# Patient Record
Sex: Female | Born: 1935
Health system: Southern US, Community
[De-identification: ages and names within clinical notes are randomized; demographics above are authoritative.]

## PROBLEM LIST (undated history)

## (undated) DIAGNOSIS — T7840XA Allergy, unspecified, initial encounter: Secondary | ICD-10-CM

## (undated) DIAGNOSIS — I8390 Asymptomatic varicose veins of unspecified lower extremity: Secondary | ICD-10-CM

## (undated) DIAGNOSIS — E7800 Pure hypercholesterolemia, unspecified: Secondary | ICD-10-CM

## (undated) DIAGNOSIS — R32 Unspecified urinary incontinence: Secondary | ICD-10-CM

## (undated) DIAGNOSIS — K5792 Diverticulitis of intestine, part unspecified, without perforation or abscess without bleeding: Secondary | ICD-10-CM

## (undated) DIAGNOSIS — I10 Essential (primary) hypertension: Secondary | ICD-10-CM

## (undated) DIAGNOSIS — M1990 Unspecified osteoarthritis, unspecified site: Secondary | ICD-10-CM

## (undated) HISTORY — DX: Diverticulitis of intestine, part unspecified, without perforation or abscess without bleeding: K57.92

## (undated) HISTORY — DX: Unspecified osteoarthritis, unspecified site: M19.90

## (undated) HISTORY — DX: Essential (primary) hypertension: I10

## (undated) HISTORY — DX: Pure hypercholesterolemia, unspecified: E78.00

## (undated) HISTORY — DX: Asymptomatic varicose veins of unspecified lower extremity: I83.90

## (undated) HISTORY — DX: Allergy, unspecified, initial encounter: T78.40XA

## (undated) HISTORY — PX: JOINT REPLACEMENT: SHX530

## (undated) HISTORY — DX: Unspecified urinary incontinence: R32

---

## 1969-10-31 HISTORY — PX: DILATION AND CURETTAGE OF UTERUS: SHX78

## 1970-10-31 HISTORY — PX: TUBAL LIGATION: SHX77

## 1998-10-31 HISTORY — PX: OTHER SURGICAL HISTORY: SHX169

## 2004-09-20 ENCOUNTER — Ambulatory Visit: Payer: Self-pay | Admitting: Internal Medicine

## 2005-10-10 ENCOUNTER — Ambulatory Visit: Payer: Self-pay | Admitting: Internal Medicine

## 2005-11-01 ENCOUNTER — Ambulatory Visit: Payer: Self-pay | Admitting: Internal Medicine

## 2006-10-16 ENCOUNTER — Ambulatory Visit: Payer: Self-pay | Admitting: Internal Medicine

## 2007-06-29 ENCOUNTER — Ambulatory Visit: Payer: Self-pay | Admitting: Gastroenterology

## 2007-10-22 ENCOUNTER — Ambulatory Visit: Payer: Self-pay | Admitting: Internal Medicine

## 2008-03-16 ENCOUNTER — Ambulatory Visit: Payer: Self-pay | Admitting: Family Medicine

## 2008-09-10 ENCOUNTER — Ambulatory Visit: Payer: Self-pay | Admitting: Internal Medicine

## 2008-10-27 ENCOUNTER — Ambulatory Visit: Payer: Self-pay | Admitting: Internal Medicine

## 2009-10-29 ENCOUNTER — Ambulatory Visit: Payer: Self-pay | Admitting: Internal Medicine

## 2010-11-02 ENCOUNTER — Ambulatory Visit: Payer: Self-pay | Admitting: Internal Medicine

## 2011-11-24 ENCOUNTER — Ambulatory Visit: Payer: Self-pay | Admitting: Obstetrics and Gynecology

## 2011-11-24 DIAGNOSIS — N63 Unspecified lump in unspecified breast: Secondary | ICD-10-CM | POA: Diagnosis not present

## 2011-11-24 DIAGNOSIS — Z1231 Encounter for screening mammogram for malignant neoplasm of breast: Secondary | ICD-10-CM | POA: Diagnosis not present

## 2011-12-16 DIAGNOSIS — N898 Other specified noninflammatory disorders of vagina: Secondary | ICD-10-CM | POA: Diagnosis not present

## 2011-12-16 DIAGNOSIS — N812 Incomplete uterovaginal prolapse: Secondary | ICD-10-CM | POA: Diagnosis not present

## 2012-01-12 DIAGNOSIS — N812 Incomplete uterovaginal prolapse: Secondary | ICD-10-CM | POA: Diagnosis not present

## 2012-01-12 DIAGNOSIS — N898 Other specified noninflammatory disorders of vagina: Secondary | ICD-10-CM | POA: Diagnosis not present

## 2012-02-06 DIAGNOSIS — N393 Stress incontinence (female) (male): Secondary | ICD-10-CM | POA: Diagnosis not present

## 2012-03-01 DIAGNOSIS — N812 Incomplete uterovaginal prolapse: Secondary | ICD-10-CM | POA: Diagnosis not present

## 2012-06-05 DIAGNOSIS — N812 Incomplete uterovaginal prolapse: Secondary | ICD-10-CM | POA: Diagnosis not present

## 2012-06-26 DIAGNOSIS — D1800 Hemangioma unspecified site: Secondary | ICD-10-CM | POA: Diagnosis not present

## 2012-06-26 DIAGNOSIS — I8390 Asymptomatic varicose veins of unspecified lower extremity: Secondary | ICD-10-CM | POA: Diagnosis not present

## 2012-06-26 DIAGNOSIS — L819 Disorder of pigmentation, unspecified: Secondary | ICD-10-CM | POA: Diagnosis not present

## 2012-06-26 DIAGNOSIS — L821 Other seborrheic keratosis: Secondary | ICD-10-CM | POA: Diagnosis not present

## 2012-06-26 DIAGNOSIS — D239 Other benign neoplasm of skin, unspecified: Secondary | ICD-10-CM | POA: Diagnosis not present

## 2012-07-24 DIAGNOSIS — M549 Dorsalgia, unspecified: Secondary | ICD-10-CM | POA: Diagnosis not present

## 2012-07-30 DIAGNOSIS — R51 Headache: Secondary | ICD-10-CM | POA: Diagnosis not present

## 2012-07-30 DIAGNOSIS — IMO0002 Reserved for concepts with insufficient information to code with codable children: Secondary | ICD-10-CM | POA: Diagnosis not present

## 2012-08-06 ENCOUNTER — Ambulatory Visit: Payer: Self-pay | Admitting: Unknown Physician Specialty

## 2012-08-06 DIAGNOSIS — R51 Headache: Secondary | ICD-10-CM | POA: Diagnosis not present

## 2012-08-06 DIAGNOSIS — J3489 Other specified disorders of nose and nasal sinuses: Secondary | ICD-10-CM | POA: Diagnosis not present

## 2012-08-08 DIAGNOSIS — R9409 Abnormal results of other function studies of central nervous system: Secondary | ICD-10-CM | POA: Diagnosis not present

## 2012-08-08 DIAGNOSIS — G500 Trigeminal neuralgia: Secondary | ICD-10-CM | POA: Diagnosis not present

## 2012-08-08 DIAGNOSIS — M549 Dorsalgia, unspecified: Secondary | ICD-10-CM | POA: Diagnosis not present

## 2012-09-07 DIAGNOSIS — N812 Incomplete uterovaginal prolapse: Secondary | ICD-10-CM | POA: Diagnosis not present

## 2012-09-12 DIAGNOSIS — G500 Trigeminal neuralgia: Secondary | ICD-10-CM | POA: Diagnosis not present

## 2012-09-17 DIAGNOSIS — N812 Incomplete uterovaginal prolapse: Secondary | ICD-10-CM | POA: Diagnosis not present

## 2012-09-19 DIAGNOSIS — M549 Dorsalgia, unspecified: Secondary | ICD-10-CM | POA: Diagnosis not present

## 2012-09-19 DIAGNOSIS — R748 Abnormal levels of other serum enzymes: Secondary | ICD-10-CM | POA: Diagnosis not present

## 2012-09-19 DIAGNOSIS — T887XXA Unspecified adverse effect of drug or medicament, initial encounter: Secondary | ICD-10-CM | POA: Diagnosis not present

## 2012-09-19 DIAGNOSIS — G500 Trigeminal neuralgia: Secondary | ICD-10-CM | POA: Diagnosis not present

## 2012-09-19 DIAGNOSIS — M4120 Other idiopathic scoliosis, site unspecified: Secondary | ICD-10-CM | POA: Diagnosis not present

## 2012-09-24 DIAGNOSIS — J329 Chronic sinusitis, unspecified: Secondary | ICD-10-CM | POA: Diagnosis not present

## 2012-09-24 DIAGNOSIS — R748 Abnormal levels of other serum enzymes: Secondary | ICD-10-CM | POA: Diagnosis not present

## 2012-09-24 DIAGNOSIS — E860 Dehydration: Secondary | ICD-10-CM | POA: Diagnosis not present

## 2012-10-03 ENCOUNTER — Ambulatory Visit: Payer: Self-pay | Admitting: Family Medicine

## 2012-10-03 DIAGNOSIS — R748 Abnormal levels of other serum enzymes: Secondary | ICD-10-CM | POA: Diagnosis not present

## 2012-10-03 DIAGNOSIS — R935 Abnormal findings on diagnostic imaging of other abdominal regions, including retroperitoneum: Secondary | ICD-10-CM | POA: Diagnosis not present

## 2012-10-03 DIAGNOSIS — R7989 Other specified abnormal findings of blood chemistry: Secondary | ICD-10-CM | POA: Diagnosis not present

## 2012-10-26 DIAGNOSIS — M545 Low back pain: Secondary | ICD-10-CM | POA: Diagnosis not present

## 2012-11-29 ENCOUNTER — Ambulatory Visit: Payer: Self-pay | Admitting: Unknown Physician Specialty

## 2012-11-29 DIAGNOSIS — M47817 Spondylosis without myelopathy or radiculopathy, lumbosacral region: Secondary | ICD-10-CM | POA: Diagnosis not present

## 2012-11-29 DIAGNOSIS — M5126 Other intervertebral disc displacement, lumbar region: Secondary | ICD-10-CM | POA: Diagnosis not present

## 2012-12-04 ENCOUNTER — Ambulatory Visit: Payer: Self-pay | Admitting: Obstetrics and Gynecology

## 2012-12-04 DIAGNOSIS — M47817 Spondylosis without myelopathy or radiculopathy, lumbosacral region: Secondary | ICD-10-CM | POA: Diagnosis not present

## 2012-12-04 DIAGNOSIS — IMO0002 Reserved for concepts with insufficient information to code with codable children: Secondary | ICD-10-CM | POA: Diagnosis not present

## 2012-12-04 DIAGNOSIS — Z1231 Encounter for screening mammogram for malignant neoplasm of breast: Secondary | ICD-10-CM | POA: Diagnosis not present

## 2012-12-04 DIAGNOSIS — M48062 Spinal stenosis, lumbar region with neurogenic claudication: Secondary | ICD-10-CM | POA: Diagnosis not present

## 2012-12-10 DIAGNOSIS — M545 Low back pain: Secondary | ICD-10-CM | POA: Diagnosis not present

## 2012-12-10 DIAGNOSIS — N812 Incomplete uterovaginal prolapse: Secondary | ICD-10-CM | POA: Diagnosis not present

## 2012-12-18 DIAGNOSIS — M545 Low back pain: Secondary | ICD-10-CM | POA: Diagnosis not present

## 2012-12-31 DIAGNOSIS — IMO0002 Reserved for concepts with insufficient information to code with codable children: Secondary | ICD-10-CM | POA: Diagnosis not present

## 2012-12-31 DIAGNOSIS — M47817 Spondylosis without myelopathy or radiculopathy, lumbosacral region: Secondary | ICD-10-CM | POA: Diagnosis not present

## 2012-12-31 DIAGNOSIS — M48062 Spinal stenosis, lumbar region with neurogenic claudication: Secondary | ICD-10-CM | POA: Diagnosis not present

## 2013-01-16 DIAGNOSIS — M62838 Other muscle spasm: Secondary | ICD-10-CM | POA: Diagnosis not present

## 2013-01-16 DIAGNOSIS — M47817 Spondylosis without myelopathy or radiculopathy, lumbosacral region: Secondary | ICD-10-CM | POA: Diagnosis not present

## 2013-01-18 DIAGNOSIS — R51 Headache: Secondary | ICD-10-CM | POA: Diagnosis not present

## 2013-01-18 DIAGNOSIS — R6889 Other general symptoms and signs: Secondary | ICD-10-CM | POA: Diagnosis not present

## 2013-01-18 DIAGNOSIS — G500 Trigeminal neuralgia: Secondary | ICD-10-CM | POA: Diagnosis not present

## 2013-01-29 DIAGNOSIS — G500 Trigeminal neuralgia: Secondary | ICD-10-CM | POA: Diagnosis not present

## 2013-01-29 DIAGNOSIS — M4800 Spinal stenosis, site unspecified: Secondary | ICD-10-CM | POA: Diagnosis not present

## 2013-02-08 DIAGNOSIS — M5380 Other specified dorsopathies, site unspecified: Secondary | ICD-10-CM | POA: Diagnosis not present

## 2013-02-21 DIAGNOSIS — I10 Essential (primary) hypertension: Secondary | ICD-10-CM | POA: Diagnosis not present

## 2013-02-28 DIAGNOSIS — M5380 Other specified dorsopathies, site unspecified: Secondary | ICD-10-CM | POA: Diagnosis not present

## 2013-03-11 DIAGNOSIS — N812 Incomplete uterovaginal prolapse: Secondary | ICD-10-CM | POA: Diagnosis not present

## 2013-03-20 DIAGNOSIS — M5380 Other specified dorsopathies, site unspecified: Secondary | ICD-10-CM | POA: Diagnosis not present

## 2013-04-08 DIAGNOSIS — M48061 Spinal stenosis, lumbar region without neurogenic claudication: Secondary | ICD-10-CM | POA: Diagnosis not present

## 2013-04-15 DIAGNOSIS — G500 Trigeminal neuralgia: Secondary | ICD-10-CM | POA: Diagnosis not present

## 2013-04-18 DIAGNOSIS — M48061 Spinal stenosis, lumbar region without neurogenic claudication: Secondary | ICD-10-CM | POA: Diagnosis not present

## 2013-05-06 DIAGNOSIS — M810 Age-related osteoporosis without current pathological fracture: Secondary | ICD-10-CM | POA: Diagnosis not present

## 2013-05-06 DIAGNOSIS — M48061 Spinal stenosis, lumbar region without neurogenic claudication: Secondary | ICD-10-CM | POA: Diagnosis not present

## 2013-05-14 DIAGNOSIS — M47817 Spondylosis without myelopathy or radiculopathy, lumbosacral region: Secondary | ICD-10-CM | POA: Diagnosis not present

## 2013-05-14 DIAGNOSIS — M5137 Other intervertebral disc degeneration, lumbosacral region: Secondary | ICD-10-CM | POA: Diagnosis not present

## 2013-05-14 DIAGNOSIS — M810 Age-related osteoporosis without current pathological fracture: Secondary | ICD-10-CM | POA: Diagnosis not present

## 2013-05-16 DIAGNOSIS — R5381 Other malaise: Secondary | ICD-10-CM | POA: Diagnosis not present

## 2013-05-16 DIAGNOSIS — I10 Essential (primary) hypertension: Secondary | ICD-10-CM | POA: Diagnosis not present

## 2013-06-05 DIAGNOSIS — M5137 Other intervertebral disc degeneration, lumbosacral region: Secondary | ICD-10-CM | POA: Diagnosis not present

## 2013-06-05 DIAGNOSIS — M5136 Other intervertebral disc degeneration, lumbar region: Secondary | ICD-10-CM | POA: Insufficient documentation

## 2013-06-05 DIAGNOSIS — M5380 Other specified dorsopathies, site unspecified: Secondary | ICD-10-CM | POA: Diagnosis not present

## 2013-06-14 DIAGNOSIS — N812 Incomplete uterovaginal prolapse: Secondary | ICD-10-CM | POA: Diagnosis not present

## 2013-06-18 DIAGNOSIS — M5380 Other specified dorsopathies, site unspecified: Secondary | ICD-10-CM | POA: Diagnosis not present

## 2013-07-09 DIAGNOSIS — M47817 Spondylosis without myelopathy or radiculopathy, lumbosacral region: Secondary | ICD-10-CM | POA: Diagnosis not present

## 2013-07-09 DIAGNOSIS — M4310 Spondylolisthesis, site unspecified: Secondary | ICD-10-CM | POA: Diagnosis not present

## 2013-07-09 DIAGNOSIS — M4800 Spinal stenosis, site unspecified: Secondary | ICD-10-CM | POA: Diagnosis not present

## 2013-07-09 DIAGNOSIS — M47812 Spondylosis without myelopathy or radiculopathy, cervical region: Secondary | ICD-10-CM | POA: Diagnosis not present

## 2013-07-09 DIAGNOSIS — Q762 Congenital spondylolisthesis: Secondary | ICD-10-CM | POA: Diagnosis not present

## 2013-07-16 ENCOUNTER — Ambulatory Visit: Payer: Self-pay | Admitting: Family Medicine

## 2013-07-16 DIAGNOSIS — M899 Disorder of bone, unspecified: Secondary | ICD-10-CM | POA: Diagnosis not present

## 2013-07-16 DIAGNOSIS — Z780 Asymptomatic menopausal state: Secondary | ICD-10-CM | POA: Diagnosis not present

## 2013-07-23 DIAGNOSIS — G500 Trigeminal neuralgia: Secondary | ICD-10-CM | POA: Diagnosis not present

## 2013-08-20 DIAGNOSIS — M48061 Spinal stenosis, lumbar region without neurogenic claudication: Secondary | ICD-10-CM | POA: Diagnosis not present

## 2013-08-20 DIAGNOSIS — M5380 Other specified dorsopathies, site unspecified: Secondary | ICD-10-CM | POA: Diagnosis not present

## 2013-08-20 DIAGNOSIS — M4800 Spinal stenosis, site unspecified: Secondary | ICD-10-CM | POA: Diagnosis not present

## 2013-08-20 DIAGNOSIS — M4310 Spondylolisthesis, site unspecified: Secondary | ICD-10-CM | POA: Diagnosis not present

## 2013-08-27 DIAGNOSIS — D239 Other benign neoplasm of skin, unspecified: Secondary | ICD-10-CM | POA: Diagnosis not present

## 2013-08-27 DIAGNOSIS — L819 Disorder of pigmentation, unspecified: Secondary | ICD-10-CM | POA: Diagnosis not present

## 2013-08-27 DIAGNOSIS — D1800 Hemangioma unspecified site: Secondary | ICD-10-CM | POA: Diagnosis not present

## 2013-08-27 DIAGNOSIS — L821 Other seborrheic keratosis: Secondary | ICD-10-CM | POA: Diagnosis not present

## 2013-08-27 DIAGNOSIS — Z23 Encounter for immunization: Secondary | ICD-10-CM | POA: Diagnosis not present

## 2013-08-28 DIAGNOSIS — N812 Incomplete uterovaginal prolapse: Secondary | ICD-10-CM | POA: Diagnosis not present

## 2013-09-13 DIAGNOSIS — M4800 Spinal stenosis, site unspecified: Secondary | ICD-10-CM | POA: Insufficient documentation

## 2013-09-13 DIAGNOSIS — M4310 Spondylolisthesis, site unspecified: Secondary | ICD-10-CM | POA: Insufficient documentation

## 2013-09-16 DIAGNOSIS — Z981 Arthrodesis status: Secondary | ICD-10-CM | POA: Diagnosis not present

## 2013-09-16 DIAGNOSIS — Z0389 Encounter for observation for other suspected diseases and conditions ruled out: Secondary | ICD-10-CM | POA: Diagnosis not present

## 2013-09-16 DIAGNOSIS — D509 Iron deficiency anemia, unspecified: Secondary | ICD-10-CM | POA: Diagnosis not present

## 2013-09-16 DIAGNOSIS — R471 Dysarthria and anarthria: Secondary | ICD-10-CM | POA: Diagnosis not present

## 2013-09-16 DIAGNOSIS — I6529 Occlusion and stenosis of unspecified carotid artery: Secondary | ICD-10-CM | POA: Diagnosis not present

## 2013-09-16 DIAGNOSIS — M6281 Muscle weakness (generalized): Secondary | ICD-10-CM | POA: Diagnosis not present

## 2013-09-16 DIAGNOSIS — R482 Apraxia: Secondary | ICD-10-CM | POA: Diagnosis not present

## 2013-09-16 DIAGNOSIS — I10 Essential (primary) hypertension: Secondary | ICD-10-CM | POA: Diagnosis not present

## 2013-09-16 DIAGNOSIS — Z4789 Encounter for other orthopedic aftercare: Secondary | ICD-10-CM | POA: Diagnosis not present

## 2013-09-16 DIAGNOSIS — M899 Disorder of bone, unspecified: Secondary | ICD-10-CM | POA: Diagnosis present

## 2013-09-16 DIAGNOSIS — I6350 Cerebral infarction due to unspecified occlusion or stenosis of unspecified cerebral artery: Secondary | ICD-10-CM | POA: Diagnosis not present

## 2013-09-16 DIAGNOSIS — M48061 Spinal stenosis, lumbar region without neurogenic claudication: Secondary | ICD-10-CM | POA: Diagnosis not present

## 2013-09-16 DIAGNOSIS — M5137 Other intervertebral disc degeneration, lumbosacral region: Secondary | ICD-10-CM | POA: Diagnosis present

## 2013-09-16 DIAGNOSIS — R29810 Facial weakness: Secondary | ICD-10-CM | POA: Diagnosis not present

## 2013-09-16 DIAGNOSIS — Z87891 Personal history of nicotine dependence: Secondary | ICD-10-CM | POA: Diagnosis not present

## 2013-09-16 DIAGNOSIS — M4310 Spondylolisthesis, site unspecified: Secondary | ICD-10-CM | POA: Diagnosis not present

## 2013-09-16 DIAGNOSIS — M4800 Spinal stenosis, site unspecified: Secondary | ICD-10-CM | POA: Diagnosis not present

## 2013-09-16 DIAGNOSIS — I6789 Other cerebrovascular disease: Secondary | ICD-10-CM | POA: Diagnosis not present

## 2013-09-16 DIAGNOSIS — R0902 Hypoxemia: Secondary | ICD-10-CM | POA: Diagnosis not present

## 2013-09-16 DIAGNOSIS — G500 Trigeminal neuralgia: Secondary | ICD-10-CM | POA: Diagnosis present

## 2013-09-16 DIAGNOSIS — Q762 Congenital spondylolisthesis: Secondary | ICD-10-CM | POA: Diagnosis not present

## 2013-09-16 HISTORY — PX: BACK SURGERY: SHX140

## 2013-09-20 DIAGNOSIS — D509 Iron deficiency anemia, unspecified: Secondary | ICD-10-CM | POA: Diagnosis not present

## 2013-09-20 DIAGNOSIS — Z4789 Encounter for other orthopedic aftercare: Secondary | ICD-10-CM | POA: Diagnosis not present

## 2013-09-20 DIAGNOSIS — I6350 Cerebral infarction due to unspecified occlusion or stenosis of unspecified cerebral artery: Secondary | ICD-10-CM | POA: Diagnosis not present

## 2013-09-20 DIAGNOSIS — R482 Apraxia: Secondary | ICD-10-CM | POA: Diagnosis not present

## 2013-09-20 DIAGNOSIS — M6281 Muscle weakness (generalized): Secondary | ICD-10-CM | POA: Diagnosis not present

## 2013-09-20 DIAGNOSIS — M4800 Spinal stenosis, site unspecified: Secondary | ICD-10-CM | POA: Diagnosis not present

## 2013-09-20 DIAGNOSIS — Q762 Congenital spondylolisthesis: Secondary | ICD-10-CM | POA: Diagnosis not present

## 2013-09-20 DIAGNOSIS — I6789 Other cerebrovascular disease: Secondary | ICD-10-CM

## 2013-09-20 DIAGNOSIS — I10 Essential (primary) hypertension: Secondary | ICD-10-CM

## 2013-09-20 DIAGNOSIS — M48061 Spinal stenosis, lumbar region without neurogenic claudication: Secondary | ICD-10-CM | POA: Diagnosis not present

## 2013-10-06 DIAGNOSIS — Z8673 Personal history of transient ischemic attack (TIA), and cerebral infarction without residual deficits: Secondary | ICD-10-CM | POA: Diagnosis not present

## 2013-10-06 DIAGNOSIS — Z4789 Encounter for other orthopedic aftercare: Secondary | ICD-10-CM | POA: Diagnosis not present

## 2013-10-06 DIAGNOSIS — M159 Polyosteoarthritis, unspecified: Secondary | ICD-10-CM | POA: Diagnosis not present

## 2013-10-06 DIAGNOSIS — G609 Hereditary and idiopathic neuropathy, unspecified: Secondary | ICD-10-CM | POA: Diagnosis not present

## 2013-10-06 DIAGNOSIS — I739 Peripheral vascular disease, unspecified: Secondary | ICD-10-CM | POA: Diagnosis not present

## 2013-10-06 DIAGNOSIS — Z5189 Encounter for other specified aftercare: Secondary | ICD-10-CM | POA: Diagnosis not present

## 2013-10-06 DIAGNOSIS — I10 Essential (primary) hypertension: Secondary | ICD-10-CM | POA: Diagnosis not present

## 2013-10-08 DIAGNOSIS — M159 Polyosteoarthritis, unspecified: Secondary | ICD-10-CM | POA: Diagnosis not present

## 2013-10-08 DIAGNOSIS — Z5189 Encounter for other specified aftercare: Secondary | ICD-10-CM | POA: Diagnosis not present

## 2013-10-08 DIAGNOSIS — I739 Peripheral vascular disease, unspecified: Secondary | ICD-10-CM | POA: Diagnosis not present

## 2013-10-08 DIAGNOSIS — G609 Hereditary and idiopathic neuropathy, unspecified: Secondary | ICD-10-CM | POA: Diagnosis not present

## 2013-10-08 DIAGNOSIS — I10 Essential (primary) hypertension: Secondary | ICD-10-CM | POA: Diagnosis not present

## 2013-10-08 DIAGNOSIS — Z4789 Encounter for other orthopedic aftercare: Secondary | ICD-10-CM | POA: Diagnosis not present

## 2013-10-10 DIAGNOSIS — Z4789 Encounter for other orthopedic aftercare: Secondary | ICD-10-CM | POA: Diagnosis not present

## 2013-10-10 DIAGNOSIS — M159 Polyosteoarthritis, unspecified: Secondary | ICD-10-CM | POA: Diagnosis not present

## 2013-10-10 DIAGNOSIS — G609 Hereditary and idiopathic neuropathy, unspecified: Secondary | ICD-10-CM | POA: Diagnosis not present

## 2013-10-10 DIAGNOSIS — I10 Essential (primary) hypertension: Secondary | ICD-10-CM | POA: Diagnosis not present

## 2013-10-10 DIAGNOSIS — Z5189 Encounter for other specified aftercare: Secondary | ICD-10-CM | POA: Diagnosis not present

## 2013-10-10 DIAGNOSIS — I739 Peripheral vascular disease, unspecified: Secondary | ICD-10-CM | POA: Diagnosis not present

## 2013-10-11 DIAGNOSIS — Z4789 Encounter for other orthopedic aftercare: Secondary | ICD-10-CM | POA: Diagnosis not present

## 2013-10-11 DIAGNOSIS — I10 Essential (primary) hypertension: Secondary | ICD-10-CM | POA: Diagnosis not present

## 2013-10-11 DIAGNOSIS — M159 Polyosteoarthritis, unspecified: Secondary | ICD-10-CM | POA: Diagnosis not present

## 2013-10-11 DIAGNOSIS — G609 Hereditary and idiopathic neuropathy, unspecified: Secondary | ICD-10-CM | POA: Diagnosis not present

## 2013-10-11 DIAGNOSIS — Z5189 Encounter for other specified aftercare: Secondary | ICD-10-CM | POA: Diagnosis not present

## 2013-10-11 DIAGNOSIS — I739 Peripheral vascular disease, unspecified: Secondary | ICD-10-CM | POA: Diagnosis not present

## 2013-10-14 DIAGNOSIS — M159 Polyosteoarthritis, unspecified: Secondary | ICD-10-CM | POA: Diagnosis not present

## 2013-10-14 DIAGNOSIS — I10 Essential (primary) hypertension: Secondary | ICD-10-CM | POA: Diagnosis not present

## 2013-10-14 DIAGNOSIS — Z5189 Encounter for other specified aftercare: Secondary | ICD-10-CM | POA: Diagnosis not present

## 2013-10-14 DIAGNOSIS — Z4789 Encounter for other orthopedic aftercare: Secondary | ICD-10-CM | POA: Diagnosis not present

## 2013-10-14 DIAGNOSIS — G609 Hereditary and idiopathic neuropathy, unspecified: Secondary | ICD-10-CM | POA: Diagnosis not present

## 2013-10-14 DIAGNOSIS — I739 Peripheral vascular disease, unspecified: Secondary | ICD-10-CM | POA: Diagnosis not present

## 2013-10-15 DIAGNOSIS — G609 Hereditary and idiopathic neuropathy, unspecified: Secondary | ICD-10-CM | POA: Diagnosis not present

## 2013-10-15 DIAGNOSIS — M159 Polyosteoarthritis, unspecified: Secondary | ICD-10-CM | POA: Diagnosis not present

## 2013-10-15 DIAGNOSIS — I10 Essential (primary) hypertension: Secondary | ICD-10-CM | POA: Diagnosis not present

## 2013-10-15 DIAGNOSIS — I739 Peripheral vascular disease, unspecified: Secondary | ICD-10-CM | POA: Diagnosis not present

## 2013-10-15 DIAGNOSIS — Z5189 Encounter for other specified aftercare: Secondary | ICD-10-CM | POA: Diagnosis not present

## 2013-10-15 DIAGNOSIS — Z4789 Encounter for other orthopedic aftercare: Secondary | ICD-10-CM | POA: Diagnosis not present

## 2013-10-16 ENCOUNTER — Ambulatory Visit: Payer: Self-pay | Admitting: Adult Health

## 2013-10-18 DIAGNOSIS — Z4789 Encounter for other orthopedic aftercare: Secondary | ICD-10-CM | POA: Diagnosis not present

## 2013-10-18 DIAGNOSIS — M159 Polyosteoarthritis, unspecified: Secondary | ICD-10-CM | POA: Diagnosis not present

## 2013-10-18 DIAGNOSIS — I10 Essential (primary) hypertension: Secondary | ICD-10-CM | POA: Diagnosis not present

## 2013-10-18 DIAGNOSIS — G609 Hereditary and idiopathic neuropathy, unspecified: Secondary | ICD-10-CM | POA: Diagnosis not present

## 2013-10-18 DIAGNOSIS — I739 Peripheral vascular disease, unspecified: Secondary | ICD-10-CM | POA: Diagnosis not present

## 2013-10-18 DIAGNOSIS — Z5189 Encounter for other specified aftercare: Secondary | ICD-10-CM | POA: Diagnosis not present

## 2013-10-21 DIAGNOSIS — I10 Essential (primary) hypertension: Secondary | ICD-10-CM | POA: Diagnosis not present

## 2013-10-21 DIAGNOSIS — Z5189 Encounter for other specified aftercare: Secondary | ICD-10-CM | POA: Diagnosis not present

## 2013-10-21 DIAGNOSIS — G609 Hereditary and idiopathic neuropathy, unspecified: Secondary | ICD-10-CM | POA: Diagnosis not present

## 2013-10-21 DIAGNOSIS — Z4789 Encounter for other orthopedic aftercare: Secondary | ICD-10-CM | POA: Diagnosis not present

## 2013-10-21 DIAGNOSIS — M159 Polyosteoarthritis, unspecified: Secondary | ICD-10-CM | POA: Diagnosis not present

## 2013-10-21 DIAGNOSIS — I739 Peripheral vascular disease, unspecified: Secondary | ICD-10-CM | POA: Diagnosis not present

## 2013-10-22 DIAGNOSIS — G609 Hereditary and idiopathic neuropathy, unspecified: Secondary | ICD-10-CM | POA: Diagnosis not present

## 2013-10-22 DIAGNOSIS — I739 Peripheral vascular disease, unspecified: Secondary | ICD-10-CM | POA: Diagnosis not present

## 2013-10-22 DIAGNOSIS — Z4789 Encounter for other orthopedic aftercare: Secondary | ICD-10-CM | POA: Diagnosis not present

## 2013-10-22 DIAGNOSIS — I10 Essential (primary) hypertension: Secondary | ICD-10-CM | POA: Diagnosis not present

## 2013-10-22 DIAGNOSIS — Z5189 Encounter for other specified aftercare: Secondary | ICD-10-CM | POA: Diagnosis not present

## 2013-10-22 DIAGNOSIS — M159 Polyosteoarthritis, unspecified: Secondary | ICD-10-CM | POA: Diagnosis not present

## 2013-10-25 DIAGNOSIS — Z4789 Encounter for other orthopedic aftercare: Secondary | ICD-10-CM | POA: Diagnosis not present

## 2013-10-25 DIAGNOSIS — I10 Essential (primary) hypertension: Secondary | ICD-10-CM | POA: Diagnosis not present

## 2013-10-25 DIAGNOSIS — Z5189 Encounter for other specified aftercare: Secondary | ICD-10-CM | POA: Diagnosis not present

## 2013-10-25 DIAGNOSIS — I739 Peripheral vascular disease, unspecified: Secondary | ICD-10-CM | POA: Diagnosis not present

## 2013-10-25 DIAGNOSIS — G609 Hereditary and idiopathic neuropathy, unspecified: Secondary | ICD-10-CM | POA: Diagnosis not present

## 2013-10-25 DIAGNOSIS — M159 Polyosteoarthritis, unspecified: Secondary | ICD-10-CM | POA: Diagnosis not present

## 2013-10-29 DIAGNOSIS — Z09 Encounter for follow-up examination after completed treatment for conditions other than malignant neoplasm: Secondary | ICD-10-CM | POA: Diagnosis not present

## 2013-10-29 DIAGNOSIS — Z4789 Encounter for other orthopedic aftercare: Secondary | ICD-10-CM | POA: Diagnosis not present

## 2013-10-29 DIAGNOSIS — Z981 Arthrodesis status: Secondary | ICD-10-CM | POA: Diagnosis not present

## 2013-11-27 DIAGNOSIS — M545 Low back pain, unspecified: Secondary | ICD-10-CM | POA: Diagnosis not present

## 2013-11-27 DIAGNOSIS — M4800 Spinal stenosis, site unspecified: Secondary | ICD-10-CM | POA: Diagnosis not present

## 2013-11-27 DIAGNOSIS — R262 Difficulty in walking, not elsewhere classified: Secondary | ICD-10-CM | POA: Diagnosis not present

## 2013-12-01 DIAGNOSIS — M4800 Spinal stenosis, site unspecified: Secondary | ICD-10-CM | POA: Diagnosis not present

## 2013-12-01 DIAGNOSIS — M545 Low back pain, unspecified: Secondary | ICD-10-CM | POA: Diagnosis not present

## 2013-12-01 DIAGNOSIS — R262 Difficulty in walking, not elsewhere classified: Secondary | ICD-10-CM | POA: Diagnosis not present

## 2013-12-05 ENCOUNTER — Ambulatory Visit: Payer: Self-pay | Admitting: Obstetrics and Gynecology

## 2013-12-05 DIAGNOSIS — Z1231 Encounter for screening mammogram for malignant neoplasm of breast: Secondary | ICD-10-CM | POA: Diagnosis not present

## 2013-12-29 DIAGNOSIS — M4800 Spinal stenosis, site unspecified: Secondary | ICD-10-CM | POA: Diagnosis not present

## 2013-12-29 DIAGNOSIS — R262 Difficulty in walking, not elsewhere classified: Secondary | ICD-10-CM | POA: Diagnosis not present

## 2013-12-29 DIAGNOSIS — M545 Low back pain, unspecified: Secondary | ICD-10-CM | POA: Diagnosis not present

## 2014-01-06 ENCOUNTER — Encounter: Payer: Self-pay | Admitting: Internal Medicine

## 2014-01-06 ENCOUNTER — Ambulatory Visit (INDEPENDENT_AMBULATORY_CARE_PROVIDER_SITE_OTHER): Payer: Medicare Other | Admitting: Internal Medicine

## 2014-01-06 VITALS — BP 130/80 | HR 70 | Temp 97.7°F | Ht 65.0 in | Wt 152.0 lb

## 2014-01-06 DIAGNOSIS — Z23 Encounter for immunization: Secondary | ICD-10-CM | POA: Diagnosis not present

## 2014-01-06 DIAGNOSIS — R32 Unspecified urinary incontinence: Secondary | ICD-10-CM

## 2014-01-06 DIAGNOSIS — E7800 Pure hypercholesterolemia, unspecified: Secondary | ICD-10-CM

## 2014-01-06 DIAGNOSIS — I10 Essential (primary) hypertension: Secondary | ICD-10-CM

## 2014-01-06 DIAGNOSIS — M549 Dorsalgia, unspecified: Secondary | ICD-10-CM

## 2014-01-06 DIAGNOSIS — G500 Trigeminal neuralgia: Secondary | ICD-10-CM | POA: Diagnosis not present

## 2014-01-06 NOTE — Progress Notes (Signed)
Pre-visit discussion using our clinic review tool. No additional management support is needed unless otherwise documented below in the visit note.  

## 2014-01-07 ENCOUNTER — Encounter: Payer: Self-pay | Admitting: Internal Medicine

## 2014-01-07 DIAGNOSIS — E7800 Pure hypercholesterolemia, unspecified: Secondary | ICD-10-CM | POA: Insufficient documentation

## 2014-01-07 DIAGNOSIS — R32 Unspecified urinary incontinence: Secondary | ICD-10-CM | POA: Insufficient documentation

## 2014-01-07 DIAGNOSIS — M961 Postlaminectomy syndrome, not elsewhere classified: Secondary | ICD-10-CM | POA: Insufficient documentation

## 2014-01-07 DIAGNOSIS — I10 Essential (primary) hypertension: Secondary | ICD-10-CM | POA: Insufficient documentation

## 2014-01-07 DIAGNOSIS — G500 Trigeminal neuralgia: Secondary | ICD-10-CM | POA: Insufficient documentation

## 2014-01-07 NOTE — Assessment & Plan Note (Signed)
S/p back surgery.  Following with Dr Dwaine Gale.  Due f/u 01/21/14.

## 2014-01-07 NOTE — Assessment & Plan Note (Signed)
Blood pressure under good control.  Same medication regimen.  Obtain outside labs before checking labs.  Follow.

## 2014-01-07 NOTE — Assessment & Plan Note (Signed)
Pessary in place.  Follow.

## 2014-01-07 NOTE — Assessment & Plan Note (Signed)
Seeing neurology.  Doing well on her current regimen.  Follow.    

## 2014-01-07 NOTE — Progress Notes (Signed)
   Subjective:    Patient ID: Denise Barry, female    DOB: Oct 03, 1934, 80 y.o.   MRN: [id number]  HPI 80 year old female with past history of osteoarthritis, hypercholesterolemia, hypertension and trigeminal neuralgia.  She comes in today to follow up on these issues as well as to establish care.  Former patient of mine at [facility].  Most recently has been seeing Dr Donneta Romberg [facility]), Dr Otilio Miu, Dr Quentin Angst (neurology - [facility]) and Dr Pietro Cassis.  Is s/p back surgery in 11/14.  Has done well since her surgery.  Has follow up planned at the end of this month with Dr Dwaine Gale.  Will have xray then.  Still taking HCTZ.  States blood pressure is ok.  Has a pessary in place.  Present for 3-4 years.  Has been seeing Dr Glennon Mac at [place].  States up to date with breast exams, pelvic exams and mammograms.  She retired 07/01/13.  Stays active.  No cardiac symptoms with increased activity and exertion.  Breathing stable.  Bowels stable.  Appetite better.     Past Medical History  Diagnosis Date  . Osteoarthritis   . Varicose veins     H/O  . Hypercholesterolemia   . Hypertension   . Urinary incontinence     pessary in place    Outpatient Encounter Prescriptions as of 01/06/2014  Medication Sig  . aspirin 81 MG tablet Take 81 mg by mouth daily.  . fexofenadine (ALLEGRA) 180 MG tablet Take 180 mg by mouth as needed for allergies or rhinitis.  . hydrochlorothiazide (MICROZIDE) 12.5 MG capsule Take 12.5 mg by mouth daily.  . Multiple Vitamins-Minerals (CENTRUM SILVER ULTRA WOMENS PO) Take by mouth.  . Oxcarbazepine (TRILEPTAL) 300 MG tablet Take 300 mg by mouth 2 (two) times daily.    Review of Systems Patient denies any headache, lightheadedness or dizziness.  No sinus or allergy symptoms.  No chest pain, tightness or palpitations.  No increased shortness of breath, cough or congestion.  No nausea or vomiting.  No acid reflux.  No abdominal pain or cramping.  No bowel change,  such as diarrhea, constipation, BRBPR or melana.  Some nocturia.  Unchanged.  Back doing better s/p surgery.  Diagnosed with trigeminal neuralgia.  On Trileptal now.  Doing well with this.  Seeing neurology.        Objective:   Physical Exam Filed Vitals:   01/06/14 1331  BP: 130/80  Pulse: 70  Temp: 97.7 F (72.10 C)   80 year old female in no acute distress.   HEENT:  Nares- clear.  Oropharynx - without lesions. NECK:  Supple.  Nontender.  No audible bruit.  HEART:  Appears to be regular. LUNGS:  No crackles or wheezing audible.  Respirations even and unlabored.  RADIAL PULSE:  Equal bilaterally.   ABDOMEN:  Soft, nontender.  Bowel sounds present and normal.  No audible abdominal bruit.    EXTREMITIES:  No increased edema present.  DP pulses palpable and equal bilaterally.          Assessment & Plan:  HEALTH MAINTENANCE.  Schedule her for a physical when due.  Had mammogram 10/2013.  Obtain results.  Had colonoscopy in 2011.  Obtain results.  Obtain outside records for review.    I spent 40 minutes with the patient and more than 50% of the time was spent in consultation regarding the above.

## 2014-01-07 NOTE — Assessment & Plan Note (Signed)
Low cholesterol diet and exercise.   Will follow.     

## 2014-01-12 ENCOUNTER — Inpatient Hospital Stay: Payer: Self-pay | Admitting: Internal Medicine

## 2014-01-12 ENCOUNTER — Ambulatory Visit: Payer: Self-pay | Admitting: Orthopedic Surgery

## 2014-01-12 DIAGNOSIS — Z9889 Other specified postprocedural states: Secondary | ICD-10-CM | POA: Diagnosis not present

## 2014-01-12 DIAGNOSIS — Z9181 History of falling: Secondary | ICD-10-CM | POA: Diagnosis not present

## 2014-01-12 DIAGNOSIS — E871 Hypo-osmolality and hyponatremia: Secondary | ICD-10-CM | POA: Diagnosis not present

## 2014-01-12 DIAGNOSIS — Z96649 Presence of unspecified artificial hip joint: Secondary | ICD-10-CM | POA: Diagnosis not present

## 2014-01-12 DIAGNOSIS — Z471 Aftercare following joint replacement surgery: Secondary | ICD-10-CM | POA: Diagnosis not present

## 2014-01-12 DIAGNOSIS — IMO0002 Reserved for concepts with insufficient information to code with codable children: Secondary | ICD-10-CM | POA: Diagnosis not present

## 2014-01-12 DIAGNOSIS — D649 Anemia, unspecified: Secondary | ICD-10-CM | POA: Diagnosis not present

## 2014-01-12 DIAGNOSIS — R269 Unspecified abnormalities of gait and mobility: Secondary | ICD-10-CM | POA: Diagnosis not present

## 2014-01-12 DIAGNOSIS — Z7982 Long term (current) use of aspirin: Secondary | ICD-10-CM | POA: Diagnosis not present

## 2014-01-12 DIAGNOSIS — Z5189 Encounter for other specified aftercare: Secondary | ICD-10-CM | POA: Diagnosis not present

## 2014-01-12 DIAGNOSIS — M6281 Muscle weakness (generalized): Secondary | ICD-10-CM | POA: Diagnosis not present

## 2014-01-12 DIAGNOSIS — Z885 Allergy status to narcotic agent status: Secondary | ICD-10-CM | POA: Diagnosis not present

## 2014-01-12 DIAGNOSIS — I10 Essential (primary) hypertension: Secondary | ICD-10-CM | POA: Diagnosis not present

## 2014-01-12 DIAGNOSIS — D62 Acute posthemorrhagic anemia: Secondary | ICD-10-CM | POA: Diagnosis not present

## 2014-01-12 DIAGNOSIS — G500 Trigeminal neuralgia: Secondary | ICD-10-CM | POA: Diagnosis not present

## 2014-01-12 DIAGNOSIS — S7290XA Unspecified fracture of unspecified femur, initial encounter for closed fracture: Secondary | ICD-10-CM | POA: Diagnosis not present

## 2014-01-12 DIAGNOSIS — M545 Low back pain, unspecified: Secondary | ICD-10-CM | POA: Diagnosis not present

## 2014-01-12 DIAGNOSIS — S72009A Fracture of unspecified part of neck of unspecified femur, initial encounter for closed fracture: Secondary | ICD-10-CM | POA: Diagnosis not present

## 2014-01-12 DIAGNOSIS — Z01811 Encounter for preprocedural respiratory examination: Secondary | ICD-10-CM | POA: Diagnosis not present

## 2014-01-12 DIAGNOSIS — M810 Age-related osteoporosis without current pathological fracture: Secondary | ICD-10-CM | POA: Diagnosis not present

## 2014-01-12 LAB — BASIC METABOLIC PANEL
ANION GAP: 10 (ref 7–16)
BUN: 19 mg/dL — AB (ref 7–18)
CALCIUM: 9.1 mg/dL (ref 8.5–10.1)
CREATININE: 0.84 mg/dL (ref 0.60–1.30)
Chloride: 93 mmol/L — ABNORMAL LOW (ref 98–107)
Co2: 28 mmol/L (ref 21–32)
EGFR (African American): >60
Glucose: 138 mg/dL — ABNORMAL HIGH (ref 65–99)
OSMOLALITY: 267 (ref 275–301)
Potassium: 3.5 mmol/L (ref 3.5–5.1)
Sodium: 131 mmol/L — ABNORMAL LOW (ref 136–145)

## 2014-01-12 LAB — URINALYSIS, COMPLETE
BILIRUBIN, UR: NEGATIVE
Bacteria: NONE SEEN
Blood: NEGATIVE
GLUCOSE, UR: NEGATIVE mg/dL (ref 0–75)
Ketone: NEGATIVE
Leukocyte Esterase: NEGATIVE
NITRITE: NEGATIVE
PROTEIN: NEGATIVE
Ph: 6 (ref 4.5–8.0)
RBC,UR: 9 /HPF (ref 0–5)
Specific Gravity: 1.018 (ref 1.003–1.030)
Squamous Epithelial: NONE SEEN

## 2014-01-12 LAB — PROTIME-INR
INR: 0.9
Prothrombin Time: 12.2 secs (ref 11.5–14.7)

## 2014-01-12 LAB — CBC
HCT: 36.4 % (ref 35.0–47.0)
HGB: 11.5 g/dL — AB (ref 12.0–16.0)
MCH: 28.7 pg (ref 26.0–34.0)
MCHC: 31.5 g/dL — ABNORMAL LOW (ref 32.0–36.0)
MCV: 91 fL (ref 80–100)
Platelet: 271 10*3/uL (ref 150–440)
RBC: 4 10*6/uL (ref 3.80–5.20)
RDW: 13 % (ref 11.5–14.5)
WBC: 16.8 10*3/uL — ABNORMAL HIGH (ref 3.6–11.0)

## 2014-01-12 LAB — APTT: Activated PTT: 26.6 secs (ref 23.6–35.9)

## 2014-01-12 LAB — MAGNESIUM: Magnesium: 1.7 mg/dL — ABNORMAL LOW

## 2014-01-12 LAB — TROPONIN I

## 2014-01-14 LAB — CBC WITH DIFFERENTIAL/PLATELET
Basophil #: 0 10*3/uL (ref 0.0–0.1)
Basophil %: 0.4 %
EOS ABS: 0.1 10*3/uL (ref 0.0–0.7)
Eosinophil %: 0.6 %
HCT: 27.4 % — ABNORMAL LOW (ref 35.0–47.0)
HGB: 9.2 g/dL — ABNORMAL LOW (ref 12.0–16.0)
Lymphocyte #: 0.8 10*3/uL — ABNORMAL LOW (ref 1.0–3.6)
Lymphocyte %: 9.2 %
MCH: 30.8 pg (ref 26.0–34.0)
MCHC: 33.5 g/dL (ref 32.0–36.0)
MCV: 92 fL (ref 80–100)
Monocyte #: 1 x10 3/mm — ABNORMAL HIGH (ref 0.2–0.9)
Monocyte %: 11.7 %
NEUTROS ABS: 6.5 10*3/uL (ref 1.4–6.5)
NEUTROS PCT: 78.1 %
PLATELETS: 168 10*3/uL (ref 150–440)
RBC: 2.98 10*6/uL — ABNORMAL LOW (ref 3.80–5.20)
RDW: 13.1 % (ref 11.5–14.5)
WBC: 8.4 10*3/uL (ref 3.6–11.0)

## 2014-01-14 LAB — BASIC METABOLIC PANEL
Anion Gap: 2 — ABNORMAL LOW (ref 7–16)
BUN: 13 mg/dL (ref 7–18)
CALCIUM: 7.8 mg/dL — AB (ref 8.5–10.1)
CREATININE: 0.81 mg/dL (ref 0.60–1.30)
Chloride: 100 mmol/L (ref 98–107)
Co2: 31 mmol/L (ref 21–32)
EGFR (African American): >60
GLUCOSE: 96 mg/dL (ref 65–99)
Osmolality: 266 (ref 275–301)
Potassium: 3.6 mmol/L (ref 3.5–5.1)
Sodium: 133 mmol/L — ABNORMAL LOW (ref 136–145)

## 2014-01-14 LAB — MAGNESIUM: MAGNESIUM: 1.8 mg/dL

## 2014-01-15 LAB — HEMOGLOBIN: HGB: 7.8 g/dL — ABNORMAL LOW (ref 12.0–16.0)

## 2014-01-15 LAB — BASIC METABOLIC PANEL
ANION GAP: 3 — AB (ref 7–16)
BUN: 16 mg/dL (ref 7–18)
CALCIUM: 7.8 mg/dL — AB (ref 8.5–10.1)
CHLORIDE: 101 mmol/L (ref 98–107)
CO2: 29 mmol/L (ref 21–32)
Creatinine: 0.86 mg/dL (ref 0.60–1.30)
EGFR (African American): >60
EGFR (Non-African Amer.): >60
GLUCOSE: 93 mg/dL (ref 65–99)
Osmolality: 267 (ref 275–301)
POTASSIUM: 3.6 mmol/L (ref 3.5–5.1)
Sodium: 133 mmol/L — ABNORMAL LOW (ref 136–145)

## 2014-01-16 ENCOUNTER — Encounter: Payer: Self-pay | Admitting: Internal Medicine

## 2014-01-16 DIAGNOSIS — S7290XA Unspecified fracture of unspecified femur, initial encounter for closed fracture: Secondary | ICD-10-CM | POA: Diagnosis not present

## 2014-01-16 DIAGNOSIS — R269 Unspecified abnormalities of gait and mobility: Secondary | ICD-10-CM | POA: Diagnosis not present

## 2014-01-16 DIAGNOSIS — Z5189 Encounter for other specified aftercare: Secondary | ICD-10-CM | POA: Diagnosis not present

## 2014-01-16 DIAGNOSIS — D62 Acute posthemorrhagic anemia: Secondary | ICD-10-CM | POA: Diagnosis not present

## 2014-01-16 DIAGNOSIS — Z471 Aftercare following joint replacement surgery: Secondary | ICD-10-CM | POA: Diagnosis not present

## 2014-01-16 DIAGNOSIS — D649 Anemia, unspecified: Secondary | ICD-10-CM | POA: Diagnosis not present

## 2014-01-16 DIAGNOSIS — S72009A Fracture of unspecified part of neck of unspecified femur, initial encounter for closed fracture: Secondary | ICD-10-CM | POA: Diagnosis not present

## 2014-01-16 DIAGNOSIS — I10 Essential (primary) hypertension: Secondary | ICD-10-CM | POA: Diagnosis not present

## 2014-01-16 DIAGNOSIS — Z96649 Presence of unspecified artificial hip joint: Secondary | ICD-10-CM | POA: Diagnosis not present

## 2014-01-16 DIAGNOSIS — M6281 Muscle weakness (generalized): Secondary | ICD-10-CM | POA: Diagnosis not present

## 2014-01-16 DIAGNOSIS — M810 Age-related osteoporosis without current pathological fracture: Secondary | ICD-10-CM | POA: Diagnosis not present

## 2014-01-16 DIAGNOSIS — E871 Hypo-osmolality and hyponatremia: Secondary | ICD-10-CM | POA: Diagnosis not present

## 2014-01-16 DIAGNOSIS — Z9181 History of falling: Secondary | ICD-10-CM | POA: Diagnosis not present

## 2014-01-16 DIAGNOSIS — M1991 Primary osteoarthritis, unspecified site: Secondary | ICD-10-CM | POA: Diagnosis not present

## 2014-01-16 LAB — HEMOGLOBIN: HGB: 7.9 g/dL — AB (ref 12.0–16.0)

## 2014-01-16 LAB — PATHOLOGY REPORT

## 2014-01-17 DIAGNOSIS — M810 Age-related osteoporosis without current pathological fracture: Secondary | ICD-10-CM | POA: Diagnosis not present

## 2014-01-17 DIAGNOSIS — M1991 Primary osteoarthritis, unspecified site: Secondary | ICD-10-CM | POA: Diagnosis not present

## 2014-01-17 DIAGNOSIS — I10 Essential (primary) hypertension: Secondary | ICD-10-CM | POA: Diagnosis not present

## 2014-01-23 LAB — HEMOGLOBIN: HGB: 8.5 g/dL — AB (ref 12.0–16.0)

## 2014-01-23 LAB — BASIC METABOLIC PANEL
ANION GAP: 7 (ref 7–16)
BUN: 15 mg/dL (ref 7–18)
CO2: 30 mmol/L (ref 21–32)
Calcium, Total: 8.7 mg/dL (ref 8.5–10.1)
Chloride: 99 mmol/L (ref 98–107)
Creatinine: 0.96 mg/dL (ref 0.60–1.30)
GFR CALC NON AF AMER: 57 — AB
Glucose: 108 mg/dL — ABNORMAL HIGH (ref 65–99)
OSMOLALITY: 271 (ref 275–301)
Potassium: 3.8 mmol/L (ref 3.5–5.1)
Sodium: 135 mmol/L — ABNORMAL LOW (ref 136–145)

## 2014-01-29 ENCOUNTER — Encounter: Payer: Self-pay | Admitting: Internal Medicine

## 2014-01-29 DIAGNOSIS — S72009A Fracture of unspecified part of neck of unspecified femur, initial encounter for closed fracture: Secondary | ICD-10-CM | POA: Diagnosis not present

## 2014-02-08 DIAGNOSIS — I10 Essential (primary) hypertension: Secondary | ICD-10-CM | POA: Diagnosis not present

## 2014-02-08 DIAGNOSIS — S72009D Fracture of unspecified part of neck of unspecified femur, subsequent encounter for closed fracture with routine healing: Secondary | ICD-10-CM | POA: Diagnosis not present

## 2014-02-08 DIAGNOSIS — Z5189 Encounter for other specified aftercare: Secondary | ICD-10-CM | POA: Diagnosis not present

## 2014-02-10 DIAGNOSIS — I10 Essential (primary) hypertension: Secondary | ICD-10-CM | POA: Diagnosis not present

## 2014-02-10 DIAGNOSIS — S72009D Fracture of unspecified part of neck of unspecified femur, subsequent encounter for closed fracture with routine healing: Secondary | ICD-10-CM | POA: Diagnosis not present

## 2014-02-10 DIAGNOSIS — Z5189 Encounter for other specified aftercare: Secondary | ICD-10-CM | POA: Diagnosis not present

## 2014-02-11 DIAGNOSIS — S72009D Fracture of unspecified part of neck of unspecified femur, subsequent encounter for closed fracture with routine healing: Secondary | ICD-10-CM | POA: Diagnosis not present

## 2014-02-11 DIAGNOSIS — Z5189 Encounter for other specified aftercare: Secondary | ICD-10-CM | POA: Diagnosis not present

## 2014-02-11 DIAGNOSIS — I10 Essential (primary) hypertension: Secondary | ICD-10-CM | POA: Diagnosis not present

## 2014-02-12 DIAGNOSIS — Z5189 Encounter for other specified aftercare: Secondary | ICD-10-CM | POA: Diagnosis not present

## 2014-02-12 DIAGNOSIS — S72009D Fracture of unspecified part of neck of unspecified femur, subsequent encounter for closed fracture with routine healing: Secondary | ICD-10-CM | POA: Diagnosis not present

## 2014-02-12 DIAGNOSIS — I10 Essential (primary) hypertension: Secondary | ICD-10-CM | POA: Diagnosis not present

## 2014-02-14 DIAGNOSIS — S72009D Fracture of unspecified part of neck of unspecified femur, subsequent encounter for closed fracture with routine healing: Secondary | ICD-10-CM | POA: Diagnosis not present

## 2014-02-14 DIAGNOSIS — I10 Essential (primary) hypertension: Secondary | ICD-10-CM | POA: Diagnosis not present

## 2014-02-14 DIAGNOSIS — Z5189 Encounter for other specified aftercare: Secondary | ICD-10-CM | POA: Diagnosis not present

## 2014-02-17 DIAGNOSIS — Z5189 Encounter for other specified aftercare: Secondary | ICD-10-CM | POA: Diagnosis not present

## 2014-02-17 DIAGNOSIS — I10 Essential (primary) hypertension: Secondary | ICD-10-CM | POA: Diagnosis not present

## 2014-02-17 DIAGNOSIS — S72009D Fracture of unspecified part of neck of unspecified femur, subsequent encounter for closed fracture with routine healing: Secondary | ICD-10-CM | POA: Diagnosis not present

## 2014-02-19 DIAGNOSIS — I10 Essential (primary) hypertension: Secondary | ICD-10-CM | POA: Diagnosis not present

## 2014-02-19 DIAGNOSIS — Z5189 Encounter for other specified aftercare: Secondary | ICD-10-CM | POA: Diagnosis not present

## 2014-02-19 DIAGNOSIS — S72009D Fracture of unspecified part of neck of unspecified femur, subsequent encounter for closed fracture with routine healing: Secondary | ICD-10-CM | POA: Diagnosis not present

## 2014-02-21 DIAGNOSIS — Z5189 Encounter for other specified aftercare: Secondary | ICD-10-CM | POA: Diagnosis not present

## 2014-02-21 DIAGNOSIS — I10 Essential (primary) hypertension: Secondary | ICD-10-CM | POA: Diagnosis not present

## 2014-02-21 DIAGNOSIS — S72009D Fracture of unspecified part of neck of unspecified femur, subsequent encounter for closed fracture with routine healing: Secondary | ICD-10-CM | POA: Diagnosis not present

## 2014-02-22 DIAGNOSIS — I10 Essential (primary) hypertension: Secondary | ICD-10-CM | POA: Diagnosis not present

## 2014-02-25 DIAGNOSIS — I10 Essential (primary) hypertension: Secondary | ICD-10-CM | POA: Diagnosis not present

## 2014-02-25 DIAGNOSIS — Z5189 Encounter for other specified aftercare: Secondary | ICD-10-CM | POA: Diagnosis not present

## 2014-02-25 DIAGNOSIS — S72009D Fracture of unspecified part of neck of unspecified femur, subsequent encounter for closed fracture with routine healing: Secondary | ICD-10-CM | POA: Diagnosis not present

## 2014-02-26 DIAGNOSIS — S72009A Fracture of unspecified part of neck of unspecified femur, initial encounter for closed fracture: Secondary | ICD-10-CM | POA: Diagnosis not present

## 2014-02-27 DIAGNOSIS — S72009D Fracture of unspecified part of neck of unspecified femur, subsequent encounter for closed fracture with routine healing: Secondary | ICD-10-CM | POA: Diagnosis not present

## 2014-02-27 DIAGNOSIS — I10 Essential (primary) hypertension: Secondary | ICD-10-CM | POA: Diagnosis not present

## 2014-02-27 DIAGNOSIS — Z5189 Encounter for other specified aftercare: Secondary | ICD-10-CM | POA: Diagnosis not present

## 2014-02-28 DIAGNOSIS — Z96649 Presence of unspecified artificial hip joint: Secondary | ICD-10-CM | POA: Diagnosis not present

## 2014-02-28 DIAGNOSIS — R262 Difficulty in walking, not elsewhere classified: Secondary | ICD-10-CM | POA: Diagnosis not present

## 2014-03-04 ENCOUNTER — Ambulatory Visit: Payer: Self-pay | Admitting: Internal Medicine

## 2014-03-11 DIAGNOSIS — Z96649 Presence of unspecified artificial hip joint: Secondary | ICD-10-CM | POA: Diagnosis not present

## 2014-03-11 DIAGNOSIS — R262 Difficulty in walking, not elsewhere classified: Secondary | ICD-10-CM | POA: Diagnosis not present

## 2014-03-12 ENCOUNTER — Telehealth: Payer: Self-pay | Admitting: Internal Medicine

## 2014-03-12 NOTE — Telephone Encounter (Signed)
Per pts sister, pt fell and had fracture.  Was admitted to [facility] for rehab.  Was waiting to hear regarding f/u.  Need to know how she is doing and if f/u needed.  Thanks.

## 2014-03-13 DIAGNOSIS — G500 Trigeminal neuralgia: Secondary | ICD-10-CM | POA: Diagnosis not present

## 2014-03-13 DIAGNOSIS — Z96649 Presence of unspecified artificial hip joint: Secondary | ICD-10-CM | POA: Diagnosis not present

## 2014-03-13 DIAGNOSIS — R262 Difficulty in walking, not elsewhere classified: Secondary | ICD-10-CM | POA: Diagnosis not present

## 2014-03-13 NOTE — Telephone Encounter (Signed)
Left detailed message on voicemail.  

## 2014-03-18 DIAGNOSIS — R262 Difficulty in walking, not elsewhere classified: Secondary | ICD-10-CM | POA: Diagnosis not present

## 2014-03-18 DIAGNOSIS — Z96649 Presence of unspecified artificial hip joint: Secondary | ICD-10-CM | POA: Diagnosis not present

## 2014-03-20 DIAGNOSIS — Z96649 Presence of unspecified artificial hip joint: Secondary | ICD-10-CM | POA: Diagnosis not present

## 2014-03-20 DIAGNOSIS — R262 Difficulty in walking, not elsewhere classified: Secondary | ICD-10-CM | POA: Diagnosis not present

## 2014-03-24 DIAGNOSIS — R262 Difficulty in walking, not elsewhere classified: Secondary | ICD-10-CM | POA: Diagnosis not present

## 2014-03-24 DIAGNOSIS — Z96649 Presence of unspecified artificial hip joint: Secondary | ICD-10-CM | POA: Diagnosis not present

## 2014-03-26 DIAGNOSIS — R262 Difficulty in walking, not elsewhere classified: Secondary | ICD-10-CM | POA: Diagnosis not present

## 2014-03-26 DIAGNOSIS — Z96649 Presence of unspecified artificial hip joint: Secondary | ICD-10-CM | POA: Diagnosis not present

## 2014-03-31 DIAGNOSIS — Z96649 Presence of unspecified artificial hip joint: Secondary | ICD-10-CM | POA: Diagnosis not present

## 2014-03-31 DIAGNOSIS — R262 Difficulty in walking, not elsewhere classified: Secondary | ICD-10-CM | POA: Diagnosis not present

## 2014-04-15 DIAGNOSIS — H2510 Age-related nuclear cataract, unspecified eye: Secondary | ICD-10-CM | POA: Diagnosis not present

## 2014-04-29 ENCOUNTER — Ambulatory Visit: Payer: Self-pay | Admitting: Ophthalmology

## 2014-04-29 DIAGNOSIS — H0589 Other disorders of orbit: Secondary | ICD-10-CM | POA: Diagnosis not present

## 2014-04-29 DIAGNOSIS — S0510XA Contusion of eyeball and orbital tissues, unspecified eye, initial encounter: Secondary | ICD-10-CM | POA: Diagnosis not present

## 2014-04-29 DIAGNOSIS — H059 Unspecified disorder of orbit: Secondary | ICD-10-CM | POA: Diagnosis not present

## 2014-04-29 DIAGNOSIS — H0520 Unspecified exophthalmos: Secondary | ICD-10-CM | POA: Diagnosis not present

## 2014-04-30 DIAGNOSIS — Z96649 Presence of unspecified artificial hip joint: Secondary | ICD-10-CM | POA: Diagnosis not present

## 2014-04-30 DIAGNOSIS — R262 Difficulty in walking, not elsewhere classified: Secondary | ICD-10-CM | POA: Diagnosis not present

## 2014-04-30 DIAGNOSIS — H2510 Age-related nuclear cataract, unspecified eye: Secondary | ICD-10-CM | POA: Diagnosis not present

## 2014-05-03 DIAGNOSIS — S0510XA Contusion of eyeball and orbital tissues, unspecified eye, initial encounter: Secondary | ICD-10-CM | POA: Diagnosis not present

## 2014-05-07 DIAGNOSIS — S0010XA Contusion of unspecified eyelid and periocular area, initial encounter: Secondary | ICD-10-CM | POA: Diagnosis not present

## 2014-05-19 DIAGNOSIS — H2510 Age-related nuclear cataract, unspecified eye: Secondary | ICD-10-CM | POA: Diagnosis not present

## 2014-05-31 DIAGNOSIS — R262 Difficulty in walking, not elsewhere classified: Secondary | ICD-10-CM | POA: Diagnosis not present

## 2014-05-31 DIAGNOSIS — Z96649 Presence of unspecified artificial hip joint: Secondary | ICD-10-CM | POA: Diagnosis not present

## 2014-06-03 DIAGNOSIS — H2510 Age-related nuclear cataract, unspecified eye: Secondary | ICD-10-CM | POA: Diagnosis not present

## 2014-06-17 ENCOUNTER — Ambulatory Visit: Payer: Self-pay | Admitting: Ophthalmology

## 2014-06-17 DIAGNOSIS — H1130 Conjunctival hemorrhage, unspecified eye: Secondary | ICD-10-CM | POA: Diagnosis not present

## 2014-06-17 DIAGNOSIS — Z01812 Encounter for preprocedural laboratory examination: Secondary | ICD-10-CM | POA: Diagnosis not present

## 2014-06-17 DIAGNOSIS — D649 Anemia, unspecified: Secondary | ICD-10-CM | POA: Diagnosis not present

## 2014-06-17 DIAGNOSIS — H2510 Age-related nuclear cataract, unspecified eye: Secondary | ICD-10-CM | POA: Diagnosis not present

## 2014-06-17 LAB — HEMOGLOBIN: HGB: 13.3 g/dL (ref 12.0–16.0)

## 2014-06-23 ENCOUNTER — Ambulatory Visit: Payer: Self-pay | Admitting: Ophthalmology

## 2014-06-23 DIAGNOSIS — H269 Unspecified cataract: Secondary | ICD-10-CM | POA: Diagnosis not present

## 2014-06-23 DIAGNOSIS — H2510 Age-related nuclear cataract, unspecified eye: Secondary | ICD-10-CM | POA: Diagnosis not present

## 2014-06-23 DIAGNOSIS — M129 Arthropathy, unspecified: Secondary | ICD-10-CM | POA: Diagnosis not present

## 2014-06-23 DIAGNOSIS — Z7982 Long term (current) use of aspirin: Secondary | ICD-10-CM | POA: Diagnosis not present

## 2014-06-23 DIAGNOSIS — Z87891 Personal history of nicotine dependence: Secondary | ICD-10-CM | POA: Diagnosis not present

## 2014-06-23 DIAGNOSIS — Z885 Allergy status to narcotic agent status: Secondary | ICD-10-CM | POA: Diagnosis not present

## 2014-06-23 DIAGNOSIS — G500 Trigeminal neuralgia: Secondary | ICD-10-CM | POA: Diagnosis not present

## 2014-06-23 DIAGNOSIS — Z79899 Other long term (current) drug therapy: Secondary | ICD-10-CM | POA: Diagnosis not present

## 2014-07-01 DIAGNOSIS — R262 Difficulty in walking, not elsewhere classified: Secondary | ICD-10-CM | POA: Diagnosis not present

## 2014-07-01 DIAGNOSIS — Z96649 Presence of unspecified artificial hip joint: Secondary | ICD-10-CM | POA: Diagnosis not present

## 2014-07-02 DIAGNOSIS — Z96649 Presence of unspecified artificial hip joint: Secondary | ICD-10-CM | POA: Diagnosis not present

## 2014-07-02 DIAGNOSIS — M25559 Pain in unspecified hip: Secondary | ICD-10-CM | POA: Diagnosis not present

## 2014-07-03 DIAGNOSIS — I10 Essential (primary) hypertension: Secondary | ICD-10-CM | POA: Diagnosis not present

## 2014-07-04 DIAGNOSIS — N812 Incomplete uterovaginal prolapse: Secondary | ICD-10-CM | POA: Diagnosis not present

## 2014-07-09 ENCOUNTER — Encounter: Payer: Self-pay | Admitting: Internal Medicine

## 2014-07-09 ENCOUNTER — Ambulatory Visit (INDEPENDENT_AMBULATORY_CARE_PROVIDER_SITE_OTHER): Payer: Medicare Other | Admitting: Internal Medicine

## 2014-07-09 VITALS — BP 130/80 | HR 66 | Temp 98.3°F | Ht 64.5 in | Wt 155.2 lb

## 2014-07-09 DIAGNOSIS — S72009D Fracture of unspecified part of neck of unspecified femur, subsequent encounter for closed fracture with routine healing: Secondary | ICD-10-CM

## 2014-07-09 DIAGNOSIS — M545 Low back pain, unspecified: Secondary | ICD-10-CM | POA: Diagnosis not present

## 2014-07-09 DIAGNOSIS — E7800 Pure hypercholesterolemia, unspecified: Secondary | ICD-10-CM

## 2014-07-09 DIAGNOSIS — S72002D Fracture of unspecified part of neck of left femur, subsequent encounter for closed fracture with routine healing: Secondary | ICD-10-CM

## 2014-07-09 DIAGNOSIS — I10 Essential (primary) hypertension: Secondary | ICD-10-CM | POA: Diagnosis not present

## 2014-07-09 DIAGNOSIS — G500 Trigeminal neuralgia: Secondary | ICD-10-CM

## 2014-07-09 DIAGNOSIS — Z0000 Encounter for general adult medical examination without abnormal findings: Secondary | ICD-10-CM | POA: Diagnosis not present

## 2014-07-09 NOTE — Progress Notes (Signed)
Pre visit review using our clinic review tool, if applicable. No additional management support is needed unless otherwise documented below in the visit note. 

## 2014-07-13 ENCOUNTER — Encounter: Payer: Self-pay | Admitting: Internal Medicine

## 2014-07-13 DIAGNOSIS — Z8781 Personal history of (healed) traumatic fracture: Secondary | ICD-10-CM | POA: Insufficient documentation

## 2014-07-13 NOTE — Assessment & Plan Note (Signed)
S/p back surgery.  Stable.  Doing well.

## 2014-07-13 NOTE — Progress Notes (Signed)
Subjective:    Patient ID: Denise Barry, female    DOB: 1934/05/08, 80 y.o.   MRN: [id number]  HPI 80 year old female with past history of osteoarthritis, hypercholesterolemia, hypertension and trigeminal neuralgia.  She comes in today to follow up on these issues as well as for a complete physical exam.  Most recently has been seeing Dr Donneta Romberg [facility] OB/gyn), Dr Otilio Miu, Dr Quentin Angst (neurology - [facility]) and Dr Pietro Cassis.  Is s/p back surgery in 11/14.  Did well after her surgery.  Had a fall in March.  Suffered a left hip fracture.  Is now s/p left hip arthroplasty.  Went to therapy.  Able to ambulate relatively well.  Some restrictions with bending.  Was off her HCTZ for a while.  Saw Dr Ronnald Ramp last week.  Blood pressure elevated.  Started back on HCTZ.  Tries to stay active.  No cardiac symptoms with increased activity and exertion.  Breathing stable.  Bowels stable.  Had cataract surgery (left) 8/15.  Had some trauma right eye.  States has blood behind the eye.  Seeing Dr Tobe Sos.  Overall she feels she is doing ok.     Past Medical History  Diagnosis Date  . Osteoarthritis   . Varicose veins     H/O  . Hypercholesterolemia   . Hypertension   . Urinary incontinence     pessary in place    Outpatient Encounter Prescriptions as of 07/09/2014  Medication Sig  . Acetaminophen (TYLENOL EXTRA STRENGTH PO) Take 1-2 tablets by mouth daily.  Marland Kitchen aspirin 81 MG tablet Take 81 mg by mouth daily.  . Bisacodyl (DULCOLAX PO) Take by mouth daily.  . fexofenadine (ALLEGRA) 180 MG tablet Take 180 mg by mouth as needed for allergies or rhinitis.  . hydrochlorothiazide (MICROZIDE) 12.5 MG capsule Take 12.5 mg by mouth daily.  . IRON PO Take by mouth daily.  . Multiple Vitamins-Minerals (CENTRUM SILVER ULTRA WOMENS PO) Take by mouth.  . Oxcarbazepine (TRILEPTAL) 300 MG tablet Take 300 mg by mouth 2 (two) times daily.  Marland Kitchen VITAMIN D, CHOLECALCIFEROL, PO Take by mouth daily.     Review of Systems Patient denies any headache, lightheadedness or dizziness.  No sinus or allergy symptoms.  No chest pain, tightness or palpitations.  No increased shortness of breath, cough or congestion.  No nausea or vomiting.  No acid reflux.  No abdominal pain or cramping.  No bowel change, such as diarrhea, constipation, BRBPR or melana.  Is s/p back surgery.  Diagnosed with trigeminal neuralgia.  On Trileptal.   Doing well with this.  Seeing neurology.   S/p left hip surgery.  Doing well.  Still some limitations as outlined.  Blood pressure elevated recently.  Back on HCTZ.       Objective:   Physical Exam  Filed Vitals:   07/09/14 0959  BP: 130/80  Pulse: 66  Temp: 98.3 F (36.8 C)   Blood pressure recheck:  64/26  80 year old female in no acute distress.   HEENT:  Nares- clear.  Oropharynx - without lesions. NECK:  Supple.  Nontender.  No audible bruit.  HEART:  Appears to be regular. LUNGS:  No crackles or wheezing audible.  Respirations even and unlabored.  RADIAL PULSE:  Equal bilaterally.    BREASTS:  No nipple discharge or nipple retraction present.  Could not appreciate any distinct nodules or axillary adenopathy.  ABDOMEN:  Soft, nontender.  Bowel sounds present and normal.  No audible abdominal bruit.  GU:  Not performed.    EXTREMITIES:  No increased edema present.  DP pulses palpable and equal bilaterally.          Assessment & Plan:  HEALTH MAINTENANCE.  Physical today.  Had mammogram 10/2013.  Had colonoscopy in 2011.  Discussed shingles vaccine.  Will check varicella titer.    I spent 25 minutes with the patient and more than 50% of the tie was spent in consultation regarding the above.

## 2014-07-13 NOTE — Assessment & Plan Note (Signed)
Low cholesterol diet and exercise.  Will follow.  Check lipid panel.

## 2014-07-13 NOTE — Assessment & Plan Note (Signed)
Seeing neurology.  Doing well on her current regimen.  Follow.

## 2014-07-13 NOTE — Assessment & Plan Note (Signed)
Blood pressure elevated.  Just restarted HCTZ.  Follow pressures.  Get her back in soon to reassess.  Check metabolic panel.

## 2014-07-13 NOTE — Assessment & Plan Note (Signed)
S/p hip fracture.  S/p surgery.  Doing well.  Follow.

## 2014-07-24 DIAGNOSIS — G500 Trigeminal neuralgia: Secondary | ICD-10-CM | POA: Diagnosis not present

## 2014-07-31 DIAGNOSIS — Z96642 Presence of left artificial hip joint: Secondary | ICD-10-CM | POA: Diagnosis not present

## 2014-07-31 DIAGNOSIS — R262 Difficulty in walking, not elsewhere classified: Secondary | ICD-10-CM | POA: Diagnosis not present

## 2014-08-04 DIAGNOSIS — Z96642 Presence of left artificial hip joint: Secondary | ICD-10-CM | POA: Diagnosis not present

## 2014-08-04 DIAGNOSIS — R262 Difficulty in walking, not elsewhere classified: Secondary | ICD-10-CM | POA: Diagnosis not present

## 2014-08-07 DIAGNOSIS — I10 Essential (primary) hypertension: Secondary | ICD-10-CM | POA: Diagnosis not present

## 2014-08-08 DIAGNOSIS — R262 Difficulty in walking, not elsewhere classified: Secondary | ICD-10-CM | POA: Diagnosis not present

## 2014-08-08 DIAGNOSIS — Z96642 Presence of left artificial hip joint: Secondary | ICD-10-CM | POA: Diagnosis not present

## 2014-08-08 LAB — BASIC METABOLIC PANEL
BUN: 19 mg/dL (ref 4–21)
Creatinine: 0.9 mg/dL (ref 0.5–1.1)
GLUCOSE: 77 mg/dL
Potassium: 3.9 mmol/L (ref 3.4–5.3)
Sodium: 141 mmol/L (ref 137–147)

## 2014-08-11 DIAGNOSIS — R262 Difficulty in walking, not elsewhere classified: Secondary | ICD-10-CM | POA: Diagnosis not present

## 2014-08-11 DIAGNOSIS — Z96642 Presence of left artificial hip joint: Secondary | ICD-10-CM | POA: Diagnosis not present

## 2014-08-13 ENCOUNTER — Encounter: Payer: Self-pay | Admitting: Internal Medicine

## 2014-08-13 ENCOUNTER — Ambulatory Visit (INDEPENDENT_AMBULATORY_CARE_PROVIDER_SITE_OTHER): Payer: Medicare Other | Admitting: Internal Medicine

## 2014-08-13 VITALS — BP 130/70 | HR 65 | Temp 97.7°F | Ht 64.5 in | Wt 156.8 lb

## 2014-08-13 DIAGNOSIS — E7800 Pure hypercholesterolemia, unspecified: Secondary | ICD-10-CM

## 2014-08-13 DIAGNOSIS — S72002D Fracture of unspecified part of neck of left femur, subsequent encounter for closed fracture with routine healing: Secondary | ICD-10-CM

## 2014-08-13 DIAGNOSIS — I10 Essential (primary) hypertension: Secondary | ICD-10-CM

## 2014-08-13 DIAGNOSIS — Z0000 Encounter for general adult medical examination without abnormal findings: Secondary | ICD-10-CM

## 2014-08-13 NOTE — Progress Notes (Signed)
Pre visit review using our clinic review tool, if applicable. No additional management support is needed unless otherwise documented below in the visit note. 

## 2014-08-19 ENCOUNTER — Telehealth: Payer: Self-pay | Admitting: *Deleted

## 2014-08-19 ENCOUNTER — Other Ambulatory Visit (INDEPENDENT_AMBULATORY_CARE_PROVIDER_SITE_OTHER): Payer: Medicare Other

## 2014-08-19 DIAGNOSIS — Z0000 Encounter for general adult medical examination without abnormal findings: Secondary | ICD-10-CM | POA: Diagnosis not present

## 2014-08-19 NOTE — Telephone Encounter (Signed)
Noted  

## 2014-08-19 NOTE — Telephone Encounter (Signed)
FYI  Pt will come back for fast labs she is just wanting the Varicella today - she has already eaten

## 2014-08-20 ENCOUNTER — Encounter: Payer: Self-pay | Admitting: *Deleted

## 2014-08-20 ENCOUNTER — Encounter: Payer: Self-pay | Admitting: Internal Medicine

## 2014-08-20 LAB — VARICELLA ZOSTER ANTIBODY, IGG: Varicella IgG: 3905 Index — ABNORMAL HIGH (ref <135.00)

## 2014-08-21 NOTE — Assessment & Plan Note (Signed)
Blood pressure elevated today.  States was better with Dr Ronnald Ramp visit.  Continue HCTZ.  Have her spot check her pressure.  Get her back in soon to reassess.  She just had labs through Dr Ronnald Ramp.

## 2014-08-21 NOTE — Assessment & Plan Note (Signed)
Low cholesterol diet and exercise.  Will follow.  Check lipid panel.

## 2014-08-21 NOTE — Assessment & Plan Note (Signed)
S/p hip fracture.  S/p surgery.  Doing well.  Follow.  Ambulating with a cane.

## 2014-08-21 NOTE — Progress Notes (Signed)
Subjective:    Patient ID: Denise Barry, female    DOB: Oct 08, 1934, 80 y.o.   MRN: [id number]  HPI 80 year old female with past history of osteoarthritis, hypercholesterolemia, hypertension and trigeminal neuralgia.  She comes in today for a scheduled follow up.  Most recently has been seeing Dr Donneta Romberg [facility] OB/gyn), Dr Otilio Miu, Dr Quentin Angst (neurology - [facility]) and Dr Pietro Cassis.  Is s/p back surgery in 11/14.  Did well after her surgery.  Had a fall in March.  Suffered a left hip fracture.  Is now s/p left hip arthroplasty.  Went to therapy.   Getting around better now.  Some right foot tightness and soreness, but better.  Moving better.  Was off her HCTZ for a while.  Saw Dr Ronnald Ramp.  He restarted the HCTZ.  She followed up with Dr Ronnald Ramp last week.  States blood pressure better.  No cardiac symptoms with increased activity and exertion.  Breathing stable.  Bowels stable.   Overall she feels she is doing ok.  Uses her cane for support and to help with her balance.  Had questions about shingles vaccine.     Past Medical History  Diagnosis Date  . Osteoarthritis   . Varicose veins     H/O  . Hypercholesterolemia   . Hypertension   . Urinary incontinence     pessary in place    Outpatient Encounter Prescriptions as of 08/13/2014  Medication Sig  . Acetaminophen (TYLENOL EXTRA STRENGTH PO) Take 1-2 tablets by mouth daily.  Marland Kitchen aspirin 81 MG tablet Take 81 mg by mouth daily.  . Bisacodyl (DULCOLAX PO) Take by mouth daily.  . fexofenadine (ALLEGRA) 180 MG tablet Take 180 mg by mouth as needed for allergies or rhinitis.  . hydrochlorothiazide (MICROZIDE) 12.5 MG capsule Take 12.5 mg by mouth daily.  . IRON PO Take by mouth daily.  . Multiple Vitamins-Minerals (CENTRUM SILVER ULTRA WOMENS PO) Take by mouth.  . Oxcarbazepine (TRILEPTAL) 300 MG tablet Take 300 mg by mouth 2 (two) times daily.  Marland Kitchen VITAMIN D, CHOLECALCIFEROL, PO Take by mouth daily.    Review of  Systems Patient denies any headache, lightheadedness or dizziness.  No sinus or allergy symptoms.  No chest pain, tightness or palpitations.  No increased shortness of breath, cough or congestion.  No nausea or vomiting.  No acid reflux.  No abdominal pain or cramping.  No bowel change, such as diarrhea, constipation, BRBPR or melana.  Is s/p back surgery.  Diagnosed with trigeminal neuralgia.  On Trileptal.   Doing well with this.  Seeing neurology.   S/p left hip surgery.  Doing well.  Walking with her cane.  Taking HCTZ.        Objective:   Physical Exam  Filed Vitals:   08/13/14 1118  BP: 130/70  Pulse: 65  Temp: 97.7 F (36.5 C)   Blood pressure recheck:  1548-31/53-69  80 year old female in no acute distress.   HEENT:  Nares- clear.  Oropharynx - without lesions. NECK:  Supple.  Nontender.  No audible bruit.  HEART:  Appears to be regular. LUNGS:  No crackles or wheezing audible.  Respirations even and unlabored.  RADIAL PULSE:  Equal bilaterally.  ABDOMEN:  Soft, nontender.  Bowel sounds present and normal.  No audible abdominal bruit.   EXTREMITIES:  No increased edema present.  DP pulses palpable and equal bilaterally.          Assessment & Plan:  HEALTH MAINTENANCE.  Physical 07/09/14.  Had mammogram 10/2013.  Had colonoscopy in 2011.  Discussed shingles vaccine.  Will check varicella titer.     Problem List Items Addressed This Visit   Essential hypertension, benign     Blood pressure elevated today.  States was better with Dr Ronnald Ramp visit.  Continue HCTZ.  Have her spot check her pressure.  Get her back in soon to reassess.  She just had labs through Dr Ronnald Ramp.      Hip fracture, left     S/p hip fracture.  S/p surgery.  Doing well.  Follow.  Ambulating with a cane.       Hypercholesterolemia     Low cholesterol diet and exercise.  Will follow.  Check lipid panel.        Other Visit Diagnoses   Routine general medical examination at a health care facility    -   Primary    Relevant Orders       Varicella zoster antibody, IgG       I spent 25 minutes with the patient and more than 50% of the tie was spent in consultation regarding the above.

## 2014-08-25 ENCOUNTER — Telehealth: Payer: Self-pay | Admitting: *Deleted

## 2014-08-25 ENCOUNTER — Other Ambulatory Visit (INDEPENDENT_AMBULATORY_CARE_PROVIDER_SITE_OTHER): Payer: Medicare Other

## 2014-08-25 DIAGNOSIS — E7800 Pure hypercholesterolemia, unspecified: Secondary | ICD-10-CM

## 2014-08-25 DIAGNOSIS — I10 Essential (primary) hypertension: Secondary | ICD-10-CM | POA: Diagnosis not present

## 2014-08-25 LAB — CBC WITH DIFFERENTIAL/PLATELET
Basophils Absolute: 0 10*3/uL (ref 0.0–0.1)
Basophils Relative: 0.7 % (ref 0.0–3.0)
Eosinophils Absolute: 0.2 10*3/uL (ref 0.0–0.7)
Eosinophils Relative: 3.3 % (ref 0.0–5.0)
HEMATOCRIT: 39.1 % (ref 36.0–46.0)
HEMOGLOBIN: 12.9 g/dL (ref 12.0–15.0)
LYMPHS ABS: 1.4 10*3/uL (ref 0.7–4.0)
Lymphocytes Relative: 28.2 % (ref 12.0–46.0)
MCHC: 33 g/dL (ref 30.0–36.0)
MCV: 95.6 fl (ref 78.0–100.0)
MONO ABS: 0.5 10*3/uL (ref 0.1–1.0)
Monocytes Relative: 9.6 % (ref 3.0–12.0)
Neutro Abs: 3 10*3/uL (ref 1.4–7.7)
Neutrophils Relative %: 58.2 % (ref 43.0–77.0)
PLATELETS: 248 10*3/uL (ref 150.0–400.0)
RBC: 4.09 Mil/uL (ref 3.87–5.11)
RDW: 13.1 % (ref 11.5–15.5)
WBC: 5.1 10*3/uL (ref 4.0–10.5)

## 2014-08-25 LAB — COMPREHENSIVE METABOLIC PANEL
ALK PHOS: 84 U/L (ref 39–117)
ALT: 17 U/L (ref 0–35)
AST: 19 U/L (ref 0–37)
Albumin: 3.4 g/dL — ABNORMAL LOW (ref 3.5–5.2)
BILIRUBIN TOTAL: 0.7 mg/dL (ref 0.2–1.2)
BUN: 17 mg/dL (ref 6–23)
CO2: 32 meq/L (ref 19–32)
Calcium: 9.2 mg/dL (ref 8.4–10.5)
Chloride: 98 mEq/L (ref 96–112)
Creatinine, Ser: 0.9 mg/dL (ref 0.4–1.2)
GFR: 64.33 mL/min (ref >60.00)
Glucose, Bld: 80 mg/dL (ref 70–99)
Potassium: 3.7 mEq/L (ref 3.5–5.1)
Sodium: 136 mEq/L (ref 135–145)
Total Protein: 6.3 g/dL (ref 6.0–8.3)

## 2014-08-25 LAB — LIPID PANEL
Cholesterol: 188 mg/dL (ref 0–200)
HDL: 69.4 mg/dL (ref >39.00)
LDL Cholesterol: 108 mg/dL — ABNORMAL HIGH (ref 0–99)
NONHDL: 118.6
TRIGLYCERIDES: 51 mg/dL (ref 0.0–149.0)
Total CHOL/HDL Ratio: 3
VLDL: 10.2 mg/dL (ref 0.0–40.0)

## 2014-08-25 LAB — TSH: TSH: 2.42 u[IU]/mL (ref 0.35–4.50)

## 2014-08-25 NOTE — Telephone Encounter (Signed)
Pt notified and verbalized understanding.

## 2014-08-25 NOTE — Telephone Encounter (Signed)
The lab (varicella titer)  lets her know that she has had chicken pox.  She was not sure if she had ever had chicken pox.  As long as no contraindication, ok for her to get shingles vaccine.  Will need to check with her insurance.  I can discuss this with her more at her upcoming appt if she desires.

## 2014-08-25 NOTE — Telephone Encounter (Signed)
Pt would like to know if she needs the Shingles shot or not?

## 2014-08-26 ENCOUNTER — Other Ambulatory Visit: Payer: Medicare Other

## 2014-08-26 ENCOUNTER — Encounter: Payer: Self-pay | Admitting: *Deleted

## 2014-09-01 DIAGNOSIS — M7989 Other specified soft tissue disorders: Secondary | ICD-10-CM | POA: Diagnosis not present

## 2014-09-02 DIAGNOSIS — I8311 Varicose veins of right lower extremity with inflammation: Secondary | ICD-10-CM | POA: Diagnosis not present

## 2014-09-02 DIAGNOSIS — I8393 Asymptomatic varicose veins of bilateral lower extremities: Secondary | ICD-10-CM | POA: Diagnosis not present

## 2014-09-02 DIAGNOSIS — L578 Other skin changes due to chronic exposure to nonionizing radiation: Secondary | ICD-10-CM | POA: Diagnosis not present

## 2014-09-02 DIAGNOSIS — Z1283 Encounter for screening for malignant neoplasm of skin: Secondary | ICD-10-CM | POA: Diagnosis not present

## 2014-09-02 DIAGNOSIS — L814 Other melanin hyperpigmentation: Secondary | ICD-10-CM | POA: Diagnosis not present

## 2014-09-02 DIAGNOSIS — L821 Other seborrheic keratosis: Secondary | ICD-10-CM | POA: Diagnosis not present

## 2014-09-02 DIAGNOSIS — D1800 Hemangioma unspecified site: Secondary | ICD-10-CM | POA: Diagnosis not present

## 2014-09-02 DIAGNOSIS — D229 Melanocytic nevi, unspecified: Secondary | ICD-10-CM | POA: Diagnosis not present

## 2014-09-17 ENCOUNTER — Ambulatory Visit (INDEPENDENT_AMBULATORY_CARE_PROVIDER_SITE_OTHER): Payer: Medicare Other | Admitting: Internal Medicine

## 2014-09-17 ENCOUNTER — Encounter: Payer: Self-pay | Admitting: Internal Medicine

## 2014-09-17 ENCOUNTER — Encounter (INDEPENDENT_AMBULATORY_CARE_PROVIDER_SITE_OTHER): Payer: Self-pay

## 2014-09-17 VITALS — BP 122/70 | HR 72 | Temp 98.1°F | Ht 64.5 in | Wt 156.0 lb

## 2014-09-17 DIAGNOSIS — M7989 Other specified soft tissue disorders: Secondary | ICD-10-CM | POA: Diagnosis not present

## 2014-09-17 DIAGNOSIS — S72002D Fracture of unspecified part of neck of left femur, subsequent encounter for closed fracture with routine healing: Secondary | ICD-10-CM | POA: Diagnosis not present

## 2014-09-17 DIAGNOSIS — I10 Essential (primary) hypertension: Secondary | ICD-10-CM | POA: Diagnosis not present

## 2014-09-17 DIAGNOSIS — G500 Trigeminal neuralgia: Secondary | ICD-10-CM

## 2014-09-17 DIAGNOSIS — E7800 Pure hypercholesterolemia, unspecified: Secondary | ICD-10-CM

## 2014-09-17 NOTE — Progress Notes (Signed)
Pre visit review using our clinic review tool, if applicable. No additional management support is needed unless otherwise documented below in the visit note. 

## 2014-09-17 NOTE — Progress Notes (Signed)
Subjective:    Patient ID: Denise Barry, female    DOB: [birth year], 80 y.o.   MRN: [id number]  HPI 80 year old female with past history of osteoarthritis, hypercholesterolemia, hypertension and trigeminal neuralgia.  She comes in today for a scheduled follow up.  Most recently has been seeing Dr Donneta Romberg [facility] OB/gyn), Dr Otilio Miu, Dr Quentin Angst (neurology - [facility]) and Dr Pietro Cassis.  Is s/p back surgery in 11/14.  Did well after her surgery.  Had a fall in March.  Suffered a left hip fracture.  Is now s/p left hip arthroplasty.  Went to therapy.  Getting around better now.   Moving better.  Was off her HCTZ for a while.  Saw Dr Ronnald Ramp.  She restarted the HCTZ.   No cardiac symptoms with increased activity and exertion.  Breathing stable.  Bowels stable.   Overall she feels she is doing ok.  Uses her cane for support and to help with her balance.  Blood pressure doing better.  Blood pressures averaging 120-140/60-70.  Having some right leg swelling.  Saw Dr Ronnald Ramp.  Placed on abx.  Better. Still swelling.  Had ultrasound.  Negative for DVT.    Past Medical History  Diagnosis Date  . Osteoarthritis   . Varicose veins     H/O  . Hypercholesterolemia   . Hypertension   . Urinary incontinence     pessary in place    Outpatient Encounter Prescriptions as of 09/17/2014  Medication Sig  . Acetaminophen (TYLENOL EXTRA STRENGTH PO) Take 1-2 tablets by mouth daily.  Marland Kitchen aspirin 81 MG tablet Take 81 mg by mouth daily.  . Bisacodyl (DULCOLAX PO) Take by mouth daily.  . fexofenadine (ALLEGRA) 180 MG tablet Take 180 mg by mouth as needed for allergies or rhinitis.  . hydrochlorothiazide (MICROZIDE) 12.5 MG capsule Take 12.5 mg by mouth daily.  . IRON PO Take by mouth daily.  . Multiple Vitamins-Minerals (CENTRUM SILVER ULTRA WOMENS PO) Take by mouth.  . Oxcarbazepine (TRILEPTAL) 300 MG tablet Take 300 mg by mouth 2 (two) times daily.  Marland Kitchen VITAMIN D, CHOLECALCIFEROL, PO Take by mouth  daily.    Review of Systems Patient denies any headache, lightheadedness or dizziness.  No sinus or allergy symptoms.  No chest pain, tightness or palpitations.  No increased shortness of breath, cough or congestion.  No nausea or vomiting.  No acid reflux.  No abdominal pain or cramping.  No bowel change, such as diarrhea, constipation, BRBPR or melana.  Is s/p back surgery.  Diagnosed with trigeminal neuralgia.  On Trileptal.   Doing well with this.  Seeing neurology.   S/p left hip surgery.  Doing well.  Walking with her cane.  Taking HCTZ.  Blood pressure better.  Swelling of right leg as outlined.         Objective:   Physical Exam  Filed Vitals:   09/17/14 1118  BP: 122/70  Pulse: 72  Temp: 98.1 F (15.48 C)   80 year old female in no acute distress.   HEENT:  Nares- clear.  Oropharynx - without lesions. NECK:  Supple.  Nontender.  No audible bruit.  HEART:  Appears to be regular. LUNGS:  No crackles or wheezing audible.  Respirations even and unlabored.  RADIAL PULSE:  Equal bilaterally.  ABDOMEN:  Soft, nontender.  Bowel sounds present and normal.  No audible abdominal bruit.   EXTREMITIES:  Right leg swelling.  No increased erythema.  Assessment & Plan:  1. Essential hypertension, benign Blood pressure as outlined.  Doing better.  Follow met b.    2. Trigeminal neuralgia Seeing Dr Manuella Ghazi.  Doing well on trileptal.    3. Hip fracture, left, closed, with routine healing, subsequent encounter Doing well.  Walking better.  Using her cane.  Follow.    4. Hypercholesterolemia Low cholesterol diet and exercise.  Follow.    5. Right leg swelling Had ultrasound - negative for DVT. Treated with abx.  Refer to vascular surgery for further evaluation.    HEALTH MAINTENANCE.  Physical 07/09/14.  Had mammogram 10/2013.  Had colonoscopy in 2011.  Discussed shingles vaccine.

## 2014-09-21 ENCOUNTER — Encounter: Payer: Self-pay | Admitting: Internal Medicine

## 2014-09-21 DIAGNOSIS — M7989 Other specified soft tissue disorders: Secondary | ICD-10-CM | POA: Insufficient documentation

## 2014-10-01 DIAGNOSIS — H2511 Age-related nuclear cataract, right eye: Secondary | ICD-10-CM | POA: Diagnosis not present

## 2014-10-29 ENCOUNTER — Ambulatory Visit: Payer: Self-pay | Admitting: Pediatrics

## 2014-10-29 DIAGNOSIS — H2511 Age-related nuclear cataract, right eye: Secondary | ICD-10-CM | POA: Diagnosis not present

## 2014-10-29 DIAGNOSIS — Z01812 Encounter for preprocedural laboratory examination: Secondary | ICD-10-CM | POA: Diagnosis not present

## 2014-10-29 DIAGNOSIS — M1990 Unspecified osteoarthritis, unspecified site: Secondary | ICD-10-CM | POA: Diagnosis not present

## 2014-10-29 DIAGNOSIS — G500 Trigeminal neuralgia: Secondary | ICD-10-CM | POA: Diagnosis not present

## 2014-10-29 DIAGNOSIS — D649 Anemia, unspecified: Secondary | ICD-10-CM | POA: Diagnosis not present

## 2014-10-29 LAB — HEMOGLOBIN: HGB: 12.7 g/dL (ref 12.0–16.0)

## 2014-10-30 DIAGNOSIS — M79609 Pain in unspecified limb: Secondary | ICD-10-CM | POA: Diagnosis not present

## 2014-10-30 DIAGNOSIS — I872 Venous insufficiency (chronic) (peripheral): Secondary | ICD-10-CM | POA: Diagnosis not present

## 2014-10-30 DIAGNOSIS — I10 Essential (primary) hypertension: Secondary | ICD-10-CM | POA: Diagnosis not present

## 2014-10-30 DIAGNOSIS — M7989 Other specified soft tissue disorders: Secondary | ICD-10-CM | POA: Diagnosis not present

## 2014-10-30 DIAGNOSIS — E785 Hyperlipidemia, unspecified: Secondary | ICD-10-CM | POA: Diagnosis not present

## 2014-10-30 DIAGNOSIS — I890 Lymphedema, not elsewhere classified: Secondary | ICD-10-CM | POA: Diagnosis not present

## 2014-11-10 ENCOUNTER — Ambulatory Visit: Payer: Self-pay | Admitting: Ophthalmology

## 2014-11-10 DIAGNOSIS — H2511 Age-related nuclear cataract, right eye: Secondary | ICD-10-CM | POA: Diagnosis not present

## 2014-11-10 DIAGNOSIS — Z885 Allergy status to narcotic agent status: Secondary | ICD-10-CM | POA: Diagnosis not present

## 2014-12-17 ENCOUNTER — Ambulatory Visit: Payer: Self-pay | Admitting: Internal Medicine

## 2014-12-17 DIAGNOSIS — Z1231 Encounter for screening mammogram for malignant neoplasm of breast: Secondary | ICD-10-CM | POA: Diagnosis not present

## 2014-12-17 LAB — HM MAMMOGRAPHY: HM Mammogram: NEGATIVE

## 2014-12-18 ENCOUNTER — Encounter: Payer: Self-pay | Admitting: *Deleted

## 2015-01-14 ENCOUNTER — Ambulatory Visit (INDEPENDENT_AMBULATORY_CARE_PROVIDER_SITE_OTHER): Payer: Medicare Other | Admitting: Internal Medicine

## 2015-01-14 ENCOUNTER — Encounter: Payer: Self-pay | Admitting: Internal Medicine

## 2015-01-14 VITALS — BP 161/75 | HR 66 | Temp 97.6°F | Ht 64.5 in | Wt 163.2 lb

## 2015-01-14 DIAGNOSIS — I10 Essential (primary) hypertension: Secondary | ICD-10-CM | POA: Diagnosis not present

## 2015-01-14 DIAGNOSIS — M7989 Other specified soft tissue disorders: Secondary | ICD-10-CM

## 2015-01-14 DIAGNOSIS — E7800 Pure hypercholesterolemia, unspecified: Secondary | ICD-10-CM

## 2015-01-14 DIAGNOSIS — Z0000 Encounter for general adult medical examination without abnormal findings: Secondary | ICD-10-CM

## 2015-01-14 MED ORDER — LISINOPRIL-HYDROCHLOROTHIAZIDE 10-12.5 MG PO TABS: 1.0000 | ORAL_TABLET | Freq: Every day | ORAL | Status: DC

## 2015-01-14 NOTE — Progress Notes (Signed)
Pre visit review using our clinic review tool, if applicable. No additional management support is needed unless otherwise documented below in the visit note. 

## 2015-01-14 NOTE — Progress Notes (Signed)
Patient ID: Monee Dembeck, female   DOB: [birth year], 81 y.o.   MRN: [id number]   Subjective:    Patient ID: [name], female    DOB: [birth year], 81 y.o.   MRN: [id number]  HPI  Patient here for a scheduled follow up.  Blood pressure elevated.  She states averaging 867-672 systolic.  Stays active.  No cardiac symptoms with increased activity or exertion.  Breathing stable.  No increased cough or congestion.  Bowels stable.     Past Medical History  Diagnosis Date  . Osteoarthritis   . Varicose veins     H/O  . Hypercholesterolemia   . Hypertension   . Urinary incontinence     pessary in place    Outpatient Encounter Prescriptions as of 01/14/2015  Medication Sig  . Acetaminophen (TYLENOL EXTRA STRENGTH PO) Take 1-2 tablets by mouth daily.  Marland Kitchen aspirin 81 MG tablet Take 81 mg by mouth daily.  . Bisacodyl (DULCOLAX PO) Take by mouth daily.  . fexofenadine (ALLEGRA) 180 MG tablet Take 180 mg by mouth as needed for allergies or rhinitis.  . IRON PO Take by mouth daily.  . Multiple Vitamins-Minerals (CENTRUM SILVER ULTRA WOMENS PO) Take by mouth.  . Oxcarbazepine (TRILEPTAL) 300 MG tablet Take 300 mg by mouth 2 (two) times daily.  Marland Kitchen VITAMIN D, CHOLECALCIFEROL, PO Take by mouth daily.  . [DISCONTINUED] hydrochlorothiazide (MICROZIDE) 12.5 MG capsule Take 12.5 mg by mouth daily.  Marland Kitchen lisinopril-hydrochlorothiazide (PRINZIDE,ZESTORETIC) 10-12.5 MG per tablet Take 1 tablet by mouth daily.    Review of Systems  Constitutional: Negative for appetite change and unexpected weight change.  HENT: Negative for congestion and sinus pressure.   Respiratory: Negative for cough, chest tightness and shortness of breath.   Cardiovascular: Negative for chest pain and palpitations.       Leg swelling better.  Wearing compression hose.  Has f/u next week.   Gastrointestinal: Negative for nausea, vomiting, abdominal pain and diarrhea.  Neurological: Negative for dizziness,  light-headedness and headaches.       Objective:     Blood pressure recheck:  152/84  Physical Exam  Constitutional: She appears well-developed and well-nourished. No distress.  HENT:  Nose: Nose normal.  Mouth/Throat: Oropharynx is clear and moist.  Neck: Neck supple. No thyromegaly present.  Cardiovascular: Normal rate and regular rhythm.   Pulmonary/Chest: Breath sounds normal. No respiratory distress. She has no wheezes.  Abdominal: Soft. Bowel sounds are normal. There is no tenderness.  Musculoskeletal: She exhibits no tenderness.  Swelling better.    Lymphadenopathy:    She has no cervical adenopathy.  Skin: No rash noted. No erythema.    BP 161/75 mmHg  Pulse 66  Temp(Src) 97.6 F (36.4 C) (Oral)  Ht 5' 4.5" (1.638 m)  Wt 163 lb 4 oz (74.05 kg)  BMI 27.60 kg/m2  SpO2 98% Wt Readings from Last 3 Encounters:  01/14/15 163 lb 4 oz (74.05 kg)  09/17/14 156 lb (70.761 kg)  08/13/14 156 lb 12 oz (71.101 kg)     Lab Results  Component Value Date   WBC 5.1 08/25/2014   HGB 12.9 08/25/2014   HCT 39.1 08/25/2014   PLT 248.0 08/25/2014   GLUCOSE 80 08/25/2014   CHOL 188 08/25/2014   TRIG 51.0 08/25/2014   HDL 69.40 08/25/2014   LDLCALC 108* 08/25/2014   ALT 17 08/25/2014   AST 19 08/25/2014   NA 136 08/25/2014   K 3.7 08/25/2014   CL 98 08/25/2014  CREATININE 0.9 08/25/2014   BUN 17 08/25/2014   CO2 32 08/25/2014   TSH 2.42 08/25/2014       Assessment & Plan:   Problem List Items Addressed This Visit    Essential hypertension, benign - Primary    Blood pressure elevated.  Change to lisinopril/hctz 10/12.5 q day.  Follow pressures.  Check metabolic panel in 09-62 days.        Relevant Medications   LISINOPRIL-HCTZ 10-12.5 MG PO TABS   Other Relevant Orders   Comprehensive metabolic panel   Health care maintenance    Physical 07/09/14.  Mammogram 2/219/16 - [facility] I.        Hypercholesterolemia    Low cholesterol diet and exercise.  Follow lipid  panel.  Check with next fasting labs.        Relevant Medications   LISINOPRIL-HCTZ 10-12.5 MG PO TABS   Other Relevant Orders   Lipid panel   Right leg swelling    Seeing [facility].  Wearing compression hose.  Follow up scheduled next week.            Einar Pheasant, MD

## 2015-01-18 ENCOUNTER — Encounter: Payer: Self-pay | Admitting: Internal Medicine

## 2015-01-18 DIAGNOSIS — Z0000 Encounter for general adult medical examination without abnormal findings: Secondary | ICD-10-CM | POA: Insufficient documentation

## 2015-01-18 NOTE — Assessment & Plan Note (Signed)
Physical 07/09/14.  Mammogram 2/219/16 - [facility] I.

## 2015-01-18 NOTE — Assessment & Plan Note (Signed)
Seeing [facility].  Wearing compression hose.  Follow up scheduled next week.

## 2015-01-18 NOTE — Assessment & Plan Note (Signed)
Low cholesterol diet and exercise.  Follow lipid panel.  Check with next fasting labs.

## 2015-01-18 NOTE — Assessment & Plan Note (Signed)
Blood pressure elevated.  Change to lisinopril/hctz 10/12.5 q day.  Follow pressures.  Check metabolic panel in 56-72 days.

## 2015-01-22 DIAGNOSIS — G500 Trigeminal neuralgia: Secondary | ICD-10-CM | POA: Diagnosis not present

## 2015-01-29 DIAGNOSIS — I872 Venous insufficiency (chronic) (peripheral): Secondary | ICD-10-CM | POA: Diagnosis not present

## 2015-01-29 DIAGNOSIS — I10 Essential (primary) hypertension: Secondary | ICD-10-CM | POA: Diagnosis not present

## 2015-01-29 DIAGNOSIS — I890 Lymphedema, not elsewhere classified: Secondary | ICD-10-CM | POA: Diagnosis not present

## 2015-01-29 DIAGNOSIS — M7989 Other specified soft tissue disorders: Secondary | ICD-10-CM | POA: Diagnosis not present

## 2015-01-29 DIAGNOSIS — E785 Hyperlipidemia, unspecified: Secondary | ICD-10-CM | POA: Diagnosis not present

## 2015-01-29 DIAGNOSIS — M79609 Pain in unspecified limb: Secondary | ICD-10-CM | POA: Diagnosis not present

## 2015-01-29 DIAGNOSIS — I8310 Varicose veins of unspecified lower extremity with inflammation: Secondary | ICD-10-CM | POA: Diagnosis not present

## 2015-02-03 ENCOUNTER — Inpatient Hospital Stay
Admit: 2015-02-03 | Disposition: A | Discharge status: Skilled Nursing Facility | Payer: Self-pay | Attending: Internal Medicine | Admitting: Internal Medicine

## 2015-02-03 DIAGNOSIS — R262 Difficulty in walking, not elsewhere classified: Secondary | ICD-10-CM | POA: Diagnosis not present

## 2015-02-03 DIAGNOSIS — S299XXA Unspecified injury of thorax, initial encounter: Secondary | ICD-10-CM | POA: Diagnosis not present

## 2015-02-03 DIAGNOSIS — R279 Unspecified lack of coordination: Secondary | ICD-10-CM | POA: Diagnosis not present

## 2015-02-03 DIAGNOSIS — R2681 Unsteadiness on feet: Secondary | ICD-10-CM | POA: Diagnosis not present

## 2015-02-03 DIAGNOSIS — I10 Essential (primary) hypertension: Secondary | ICD-10-CM | POA: Diagnosis present

## 2015-02-03 DIAGNOSIS — R946 Abnormal results of thyroid function studies: Secondary | ICD-10-CM | POA: Diagnosis present

## 2015-02-03 DIAGNOSIS — R197 Diarrhea, unspecified: Secondary | ICD-10-CM | POA: Diagnosis not present

## 2015-02-03 DIAGNOSIS — G500 Trigeminal neuralgia: Secondary | ICD-10-CM | POA: Diagnosis not present

## 2015-02-03 DIAGNOSIS — A09 Infectious gastroenteritis and colitis, unspecified: Secondary | ICD-10-CM | POA: Diagnosis not present

## 2015-02-03 DIAGNOSIS — S42213D Unspecified displaced fracture of surgical neck of unspecified humerus, subsequent encounter for fracture with routine healing: Secondary | ICD-10-CM | POA: Diagnosis not present

## 2015-02-03 DIAGNOSIS — Z736 Limitation of activities due to disability: Secondary | ICD-10-CM | POA: Diagnosis not present

## 2015-02-03 DIAGNOSIS — K559 Vascular disorder of intestine, unspecified: Secondary | ICD-10-CM | POA: Diagnosis not present

## 2015-02-03 DIAGNOSIS — Z823 Family history of stroke: Secondary | ICD-10-CM | POA: Diagnosis not present

## 2015-02-03 DIAGNOSIS — R933 Abnormal findings on diagnostic imaging of other parts of digestive tract: Secondary | ICD-10-CM | POA: Diagnosis not present

## 2015-02-03 DIAGNOSIS — M6281 Muscle weakness (generalized): Secondary | ICD-10-CM | POA: Diagnosis not present

## 2015-02-03 DIAGNOSIS — Z86010 Personal history of colonic polyps: Secondary | ICD-10-CM | POA: Diagnosis not present

## 2015-02-03 DIAGNOSIS — S42222A 2-part displaced fracture of surgical neck of left humerus, initial encounter for closed fracture: Secondary | ICD-10-CM | POA: Diagnosis present

## 2015-02-03 DIAGNOSIS — R112 Nausea with vomiting, unspecified: Secondary | ICD-10-CM | POA: Diagnosis not present

## 2015-02-03 DIAGNOSIS — S42212A Unspecified displaced fracture of surgical neck of left humerus, initial encounter for closed fracture: Secondary | ICD-10-CM | POA: Diagnosis not present

## 2015-02-03 DIAGNOSIS — S42222D 2-part displaced fracture of surgical neck of left humerus, subsequent encounter for fracture with routine healing: Secondary | ICD-10-CM | POA: Diagnosis not present

## 2015-02-03 DIAGNOSIS — R42 Dizziness and giddiness: Secondary | ICD-10-CM | POA: Diagnosis not present

## 2015-02-03 DIAGNOSIS — K529 Noninfective gastroenteritis and colitis, unspecified: Secondary | ICD-10-CM | POA: Diagnosis not present

## 2015-02-03 DIAGNOSIS — Z833 Family history of diabetes mellitus: Secondary | ICD-10-CM | POA: Diagnosis not present

## 2015-02-03 DIAGNOSIS — N179 Acute kidney failure, unspecified: Secondary | ICD-10-CM | POA: Diagnosis not present

## 2015-02-03 DIAGNOSIS — Z825 Family history of asthma and other chronic lower respiratory diseases: Secondary | ICD-10-CM | POA: Diagnosis not present

## 2015-02-03 DIAGNOSIS — Z96642 Presence of left artificial hip joint: Secondary | ICD-10-CM | POA: Diagnosis present

## 2015-02-03 DIAGNOSIS — K5289 Other specified noninfective gastroenteritis and colitis: Secondary | ICD-10-CM | POA: Diagnosis not present

## 2015-02-03 DIAGNOSIS — Z7982 Long term (current) use of aspirin: Secondary | ICD-10-CM | POA: Diagnosis not present

## 2015-02-03 DIAGNOSIS — Z87891 Personal history of nicotine dependence: Secondary | ICD-10-CM | POA: Diagnosis not present

## 2015-02-03 DIAGNOSIS — Z9181 History of falling: Secondary | ICD-10-CM | POA: Diagnosis not present

## 2015-02-03 DIAGNOSIS — E860 Dehydration: Secondary | ICD-10-CM | POA: Diagnosis not present

## 2015-02-03 DIAGNOSIS — S42202A Unspecified fracture of upper end of left humerus, initial encounter for closed fracture: Secondary | ICD-10-CM | POA: Diagnosis not present

## 2015-02-03 DIAGNOSIS — W06XXXA Fall from bed, initial encounter: Secondary | ICD-10-CM | POA: Diagnosis not present

## 2015-02-03 DIAGNOSIS — A419 Sepsis, unspecified organism: Secondary | ICD-10-CM | POA: Diagnosis not present

## 2015-02-03 DIAGNOSIS — R1110 Vomiting, unspecified: Secondary | ICD-10-CM | POA: Diagnosis not present

## 2015-02-03 DIAGNOSIS — M79602 Pain in left arm: Secondary | ICD-10-CM | POA: Diagnosis not present

## 2015-02-03 LAB — CBC WITH DIFFERENTIAL/PLATELET
BASOS PCT: 0.5 %
Basophil #: 0.1 10*3/uL (ref 0.0–0.1)
EOS PCT: 0.6 %
Eosinophil #: 0.1 10*3/uL (ref 0.0–0.7)
HCT: 45.6 % (ref 35.0–47.0)
HGB: 14.6 g/dL (ref 12.0–16.0)
Lymphocyte #: 1.7 10*3/uL (ref 1.0–3.6)
Lymphocyte %: 8.6 %
MCH: 30.7 pg (ref 26.0–34.0)
MCHC: 32 g/dL (ref 32.0–36.0)
MCV: 96 fL (ref 80–100)
MONOS PCT: 3.3 %
Monocyte #: 0.7 x10 3/mm (ref 0.2–0.9)
NEUTROS PCT: 87 %
Neutrophil #: 17.5 10*3/uL — ABNORMAL HIGH (ref 1.4–6.5)
Platelet: 322 10*3/uL (ref 150–440)
RBC: 4.76 10*6/uL (ref 3.80–5.20)
RDW: 14 % (ref 11.5–14.5)
WBC: 20.1 10*3/uL — AB (ref 3.6–11.0)

## 2015-02-03 LAB — COMPREHENSIVE METABOLIC PANEL
ALK PHOS: 91 U/L
ALT: 19 U/L
AST: 37 U/L
Albumin: 4 g/dL
Anion Gap: 14 (ref 7–16)
BUN: 22 mg/dL — ABNORMAL HIGH
Bilirubin,Total: 0.7 mg/dL
CALCIUM: 8.9 mg/dL
CO2: 24 mmol/L
Chloride: 101 mmol/L
Creatinine: 1.53 mg/dL — ABNORMAL HIGH
EGFR (African American): 37 — ABNORMAL LOW
GFR CALC NON AF AMER: 32 — AB
GLUCOSE: 183 mg/dL — AB
Potassium: 3.7 mmol/L
SODIUM: 139 mmol/L
Total Protein: 6.7 g/dL

## 2015-02-03 LAB — TROPONIN I: Troponin-I: <0.03 ng/mL

## 2015-02-03 LAB — PROTIME-INR
INR: 1
PROTHROMBIN TIME: 13.3 s

## 2015-02-03 LAB — LACTIC ACID, PLASMA: LACTIC ACID, VENOUS: 3.1 mmol/L — AB

## 2015-02-03 LAB — MAGNESIUM: Magnesium: 2.5 mg/dL — ABNORMAL HIGH

## 2015-02-03 LAB — TSH: THYROID STIMULATING HORM: 10.176 u[IU]/mL — AB

## 2015-02-03 LAB — LIPASE, BLOOD: Lipase: 80 U/L — ABNORMAL HIGH

## 2015-02-03 LAB — PHOSPHORUS: Phosphorus: 3.9 mg/dL

## 2015-02-03 LAB — CLOSTRIDIUM DIFFICILE(ARMC)

## 2015-02-04 LAB — CBC WITH DIFFERENTIAL/PLATELET
Basophil #: 0 10*3/uL (ref 0.0–0.1)
Basophil %: 0.1 %
Eosinophil #: 0 10*3/uL (ref 0.0–0.7)
Eosinophil %: 0 %
HCT: 36 % (ref 35.0–47.0)
HGB: 11.4 g/dL — ABNORMAL LOW (ref 12.0–16.0)
LYMPHS ABS: 0.6 10*3/uL — AB (ref 1.0–3.6)
Lymphocyte %: 3.7 %
MCH: 30.4 pg (ref 26.0–34.0)
MCHC: 31.8 g/dL — ABNORMAL LOW (ref 32.0–36.0)
MCV: 96 fL (ref 80–100)
MONO ABS: 1.3 x10 3/mm — AB (ref 0.2–0.9)
Monocyte %: 8.5 %
Neutrophil #: 13 10*3/uL — ABNORMAL HIGH (ref 1.4–6.5)
Neutrophil %: 87.7 %
Platelet: 266 10*3/uL (ref 150–440)
RBC: 3.76 10*6/uL — ABNORMAL LOW (ref 3.80–5.20)
RDW: 13.8 % (ref 11.5–14.5)
WBC: 14.9 10*3/uL — AB (ref 3.6–11.0)

## 2015-02-04 LAB — BASIC METABOLIC PANEL
Anion Gap: 7 (ref 7–16)
BUN: 27 mg/dL — ABNORMAL HIGH
CO2: 22 mmol/L
CREATININE: 1.18 mg/dL — AB
Calcium, Total: 7.4 mg/dL — ABNORMAL LOW
Chloride: 110 mmol/L
EGFR (Non-African Amer.): 44 — ABNORMAL LOW
GFR CALC AF AMER: 51 — AB
Glucose: 135 mg/dL — ABNORMAL HIGH
Potassium: 3.9 mmol/L
Sodium: 139 mmol/L

## 2015-02-04 LAB — LACTIC ACID, PLASMA
LACTIC ACID, VENOUS: 1.8 mmol/L
Lactic Acid, Venous: 1.3 mmol/L

## 2015-02-04 LAB — T4, FREE: Free Thyroxine: 0.9 ng/dL

## 2015-02-05 LAB — BASIC METABOLIC PANEL
ANION GAP: 6 — AB (ref 7–16)
BUN: 21 mg/dL — ABNORMAL HIGH
Calcium, Total: 7.6 mg/dL — ABNORMAL LOW
Chloride: 109 mmol/L
Co2: 23 mmol/L
Creatinine: 1.04 mg/dL — ABNORMAL HIGH
GFR CALC AF AMER: 60 — AB
GFR CALC NON AF AMER: 51 — AB
Glucose: 103 mg/dL — ABNORMAL HIGH
Potassium: 3.5 mmol/L
Sodium: 138 mmol/L

## 2015-02-05 LAB — URINALYSIS, COMPLETE
BILIRUBIN, UR: NEGATIVE
Glucose,UR: NEGATIVE mg/dL (ref 0–75)
Nitrite: NEGATIVE
PROTEIN: NEGATIVE
Ph: 5 (ref 4.5–8.0)
SPECIFIC GRAVITY: 1.02 (ref 1.003–1.030)
WBC UR: 11 /HPF (ref 0–5)

## 2015-02-06 DIAGNOSIS — S42202A Unspecified fracture of upper end of left humerus, initial encounter for closed fracture: Secondary | ICD-10-CM | POA: Diagnosis not present

## 2015-02-06 DIAGNOSIS — S42232A 3-part fracture of surgical neck of left humerus, initial encounter for closed fracture: Secondary | ICD-10-CM | POA: Diagnosis not present

## 2015-02-06 DIAGNOSIS — R2681 Unsteadiness on feet: Secondary | ICD-10-CM | POA: Diagnosis not present

## 2015-02-06 DIAGNOSIS — N179 Acute kidney failure, unspecified: Secondary | ICD-10-CM | POA: Diagnosis not present

## 2015-02-06 DIAGNOSIS — M25512 Pain in left shoulder: Secondary | ICD-10-CM | POA: Diagnosis not present

## 2015-02-06 DIAGNOSIS — R262 Difficulty in walking, not elsewhere classified: Secondary | ICD-10-CM | POA: Diagnosis not present

## 2015-02-06 DIAGNOSIS — G8918 Other acute postprocedural pain: Secondary | ICD-10-CM | POA: Diagnosis not present

## 2015-02-06 DIAGNOSIS — Z9181 History of falling: Secondary | ICD-10-CM | POA: Diagnosis not present

## 2015-02-06 DIAGNOSIS — G500 Trigeminal neuralgia: Secondary | ICD-10-CM | POA: Diagnosis not present

## 2015-02-06 DIAGNOSIS — S42222A 2-part displaced fracture of surgical neck of left humerus, initial encounter for closed fracture: Secondary | ICD-10-CM | POA: Diagnosis not present

## 2015-02-06 DIAGNOSIS — A09 Infectious gastroenteritis and colitis, unspecified: Secondary | ICD-10-CM | POA: Diagnosis not present

## 2015-02-06 DIAGNOSIS — S42222D 2-part displaced fracture of surgical neck of left humerus, subsequent encounter for fracture with routine healing: Secondary | ICD-10-CM | POA: Diagnosis not present

## 2015-02-06 DIAGNOSIS — M6281 Muscle weakness (generalized): Secondary | ICD-10-CM | POA: Diagnosis not present

## 2015-02-06 DIAGNOSIS — Z736 Limitation of activities due to disability: Secondary | ICD-10-CM | POA: Diagnosis not present

## 2015-02-06 DIAGNOSIS — A419 Sepsis, unspecified organism: Secondary | ICD-10-CM | POA: Diagnosis not present

## 2015-02-06 DIAGNOSIS — S42292A Other displaced fracture of upper end of left humerus, initial encounter for closed fracture: Secondary | ICD-10-CM | POA: Diagnosis not present

## 2015-02-06 DIAGNOSIS — I10 Essential (primary) hypertension: Secondary | ICD-10-CM | POA: Diagnosis not present

## 2015-02-06 DIAGNOSIS — S42212A Unspecified displaced fracture of surgical neck of left humerus, initial encounter for closed fracture: Secondary | ICD-10-CM | POA: Diagnosis not present

## 2015-02-06 DIAGNOSIS — D509 Iron deficiency anemia, unspecified: Secondary | ICD-10-CM | POA: Diagnosis not present

## 2015-02-06 DIAGNOSIS — S42213D Unspecified displaced fracture of surgical neck of unspecified humerus, subsequent encounter for fracture with routine healing: Secondary | ICD-10-CM | POA: Diagnosis not present

## 2015-02-06 DIAGNOSIS — R279 Unspecified lack of coordination: Secondary | ICD-10-CM | POA: Diagnosis not present

## 2015-02-06 LAB — BASIC METABOLIC PANEL
Anion Gap: 3 — ABNORMAL LOW (ref 7–16)
BUN: 15 mg/dL
CALCIUM: 7.6 mg/dL — AB
CHLORIDE: 113 mmol/L — AB
CO2: 23 mmol/L
Creatinine: 0.86 mg/dL
EGFR (African American): >60
EGFR (Non-African Amer.): >60
GLUCOSE: 101 mg/dL — AB
POTASSIUM: 3.1 mmol/L — AB
Sodium: 139 mmol/L

## 2015-02-06 LAB — URINE CULTURE

## 2015-02-06 LAB — STOOL CULTURE

## 2015-02-08 LAB — CULTURE, BLOOD (SINGLE)

## 2015-02-09 DIAGNOSIS — S42232A 3-part fracture of surgical neck of left humerus, initial encounter for closed fracture: Secondary | ICD-10-CM | POA: Diagnosis not present

## 2015-02-09 DIAGNOSIS — S42222A 2-part displaced fracture of surgical neck of left humerus, initial encounter for closed fracture: Secondary | ICD-10-CM | POA: Insufficient documentation

## 2015-02-10 DIAGNOSIS — G8918 Other acute postprocedural pain: Secondary | ICD-10-CM | POA: Diagnosis not present

## 2015-02-10 DIAGNOSIS — I10 Essential (primary) hypertension: Secondary | ICD-10-CM | POA: Diagnosis not present

## 2015-02-10 DIAGNOSIS — G500 Trigeminal neuralgia: Secondary | ICD-10-CM | POA: Diagnosis not present

## 2015-02-10 DIAGNOSIS — S42222A 2-part displaced fracture of surgical neck of left humerus, initial encounter for closed fracture: Secondary | ICD-10-CM | POA: Diagnosis not present

## 2015-02-10 DIAGNOSIS — S42202A Unspecified fracture of upper end of left humerus, initial encounter for closed fracture: Secondary | ICD-10-CM | POA: Diagnosis not present

## 2015-02-10 DIAGNOSIS — S42292A Other displaced fracture of upper end of left humerus, initial encounter for closed fracture: Secondary | ICD-10-CM | POA: Diagnosis not present

## 2015-02-10 DIAGNOSIS — M25512 Pain in left shoulder: Secondary | ICD-10-CM | POA: Diagnosis not present

## 2015-02-10 DIAGNOSIS — S42212A Unspecified displaced fracture of surgical neck of left humerus, initial encounter for closed fracture: Secondary | ICD-10-CM | POA: Diagnosis not present

## 2015-02-10 DIAGNOSIS — D509 Iron deficiency anemia, unspecified: Secondary | ICD-10-CM | POA: Diagnosis not present

## 2015-02-10 DIAGNOSIS — M6281 Muscle weakness (generalized): Secondary | ICD-10-CM | POA: Diagnosis not present

## 2015-02-10 DIAGNOSIS — R262 Difficulty in walking, not elsewhere classified: Secondary | ICD-10-CM | POA: Diagnosis not present

## 2015-02-10 LAB — POTASSIUM: Potassium: 3.4 mmol/L — ABNORMAL LOW

## 2015-02-11 ENCOUNTER — Observation Stay
Admit: 2015-02-11 | Disposition: A | Discharge status: Skilled Nursing Facility | Payer: Self-pay | Attending: Surgery | Admitting: Surgery

## 2015-02-11 DIAGNOSIS — M25512 Pain in left shoulder: Secondary | ICD-10-CM | POA: Diagnosis not present

## 2015-02-11 DIAGNOSIS — R279 Unspecified lack of coordination: Secondary | ICD-10-CM | POA: Diagnosis not present

## 2015-02-11 DIAGNOSIS — I10 Essential (primary) hypertension: Secondary | ICD-10-CM | POA: Diagnosis not present

## 2015-02-11 DIAGNOSIS — R2681 Unsteadiness on feet: Secondary | ICD-10-CM | POA: Diagnosis not present

## 2015-02-11 DIAGNOSIS — Z8781 Personal history of (healed) traumatic fracture: Secondary | ICD-10-CM | POA: Diagnosis not present

## 2015-02-11 DIAGNOSIS — Z4789 Encounter for other orthopedic aftercare: Secondary | ICD-10-CM | POA: Diagnosis not present

## 2015-02-11 DIAGNOSIS — Z9181 History of falling: Secondary | ICD-10-CM | POA: Diagnosis not present

## 2015-02-11 DIAGNOSIS — D509 Iron deficiency anemia, unspecified: Secondary | ICD-10-CM | POA: Diagnosis not present

## 2015-02-11 DIAGNOSIS — Z967 Presence of other bone and tendon implants: Secondary | ICD-10-CM | POA: Diagnosis not present

## 2015-02-11 DIAGNOSIS — M6281 Muscle weakness (generalized): Secondary | ICD-10-CM | POA: Diagnosis not present

## 2015-02-11 DIAGNOSIS — S42213D Unspecified displaced fracture of surgical neck of unspecified humerus, subsequent encounter for fracture with routine healing: Secondary | ICD-10-CM | POA: Diagnosis not present

## 2015-02-11 DIAGNOSIS — S42222A 2-part displaced fracture of surgical neck of left humerus, initial encounter for closed fracture: Secondary | ICD-10-CM | POA: Diagnosis not present

## 2015-02-11 DIAGNOSIS — S42202A Unspecified fracture of upper end of left humerus, initial encounter for closed fracture: Secondary | ICD-10-CM | POA: Diagnosis not present

## 2015-02-11 DIAGNOSIS — S42222D 2-part displaced fracture of surgical neck of left humerus, subsequent encounter for fracture with routine healing: Secondary | ICD-10-CM | POA: Diagnosis not present

## 2015-02-11 DIAGNOSIS — Z736 Limitation of activities due to disability: Secondary | ICD-10-CM | POA: Diagnosis not present

## 2015-02-11 DIAGNOSIS — R262 Difficulty in walking, not elsewhere classified: Secondary | ICD-10-CM | POA: Diagnosis not present

## 2015-02-11 DIAGNOSIS — G500 Trigeminal neuralgia: Secondary | ICD-10-CM | POA: Diagnosis not present

## 2015-02-11 DIAGNOSIS — A09 Infectious gastroenteritis and colitis, unspecified: Secondary | ICD-10-CM | POA: Diagnosis not present

## 2015-02-11 DIAGNOSIS — R112 Nausea with vomiting, unspecified: Secondary | ICD-10-CM | POA: Diagnosis not present

## 2015-02-11 DIAGNOSIS — E039 Hypothyroidism, unspecified: Secondary | ICD-10-CM | POA: Diagnosis not present

## 2015-02-11 LAB — BASIC METABOLIC PANEL
ANION GAP: 3 — AB (ref 7–16)
BUN: 13 mg/dL
CALCIUM: 7.6 mg/dL — AB
CHLORIDE: 103 mmol/L
Co2: 29 mmol/L
Creatinine: 0.67 mg/dL
EGFR (Non-African Amer.): >60
Glucose: 100 mg/dL — ABNORMAL HIGH
POTASSIUM: 3.7 mmol/L
Sodium: 135 mmol/L

## 2015-02-17 DIAGNOSIS — E039 Hypothyroidism, unspecified: Secondary | ICD-10-CM | POA: Diagnosis not present

## 2015-02-17 DIAGNOSIS — R112 Nausea with vomiting, unspecified: Secondary | ICD-10-CM | POA: Diagnosis not present

## 2015-02-17 DIAGNOSIS — S42213D Unspecified displaced fracture of surgical neck of unspecified humerus, subsequent encounter for fracture with routine healing: Secondary | ICD-10-CM | POA: Diagnosis not present

## 2015-02-17 DIAGNOSIS — I10 Essential (primary) hypertension: Secondary | ICD-10-CM | POA: Diagnosis not present

## 2015-02-17 DIAGNOSIS — A09 Infectious gastroenteritis and colitis, unspecified: Secondary | ICD-10-CM | POA: Diagnosis not present

## 2015-02-20 DIAGNOSIS — Z8781 Personal history of (healed) traumatic fracture: Secondary | ICD-10-CM | POA: Diagnosis not present

## 2015-02-20 DIAGNOSIS — Z967 Presence of other bone and tendon implants: Secondary | ICD-10-CM | POA: Diagnosis not present

## 2015-02-21 NOTE — Op Note (Signed)
PATIENT NAME:  Denise Barry, Denise Barry MR#:  [id number] DATE OF BIRTH:  1934-05-27  DATE OF PROCEDURE:  06/23/2014  PREOPERATIVE DIAGNOSIS: Cataract, left eye.   POSTOPERATIVE DIAGNOSIS: Cataract, left eye.   PROCEDURE PERFORMED: Extracapsular cataract extraction using phacoemulsification with placement of Alcon SN6CWS 20.5 diopter posterior chamber lens, serial #[id number].   SURGEON: Loura Back. Elecia Serafin, MD   ANESTHESIA: 4% lidocaine and 0.75% Marcaine, a 50/50 mixture with 10 units/mL of Hylenex added, given as a peribulbar.  ANESTHESIOLOGIST: Gjibertus F. [place] Service, MD   COMPLICATIONS: None.   ESTIMATED BLOOD LOSS: Less than 1 mL.   DESCRIPTION OF PROCEDURE: The patient was brought to the operating room and given a peribulbar block.  The patient was then prepped and draped in the usual fashion.  The vertical rectus muscles were imbricated using 5-0 silk sutures.  These sutures were then clamped to the sterile drapes as bridle sutures.  A limbal peritomy was performed extending 2 clock hours and hemostasis was obtained with cautery.  A partial-thickness scleral groove was made at the surgical limbus and dissected anteriorly in a lamellar dissection using an Alcon crescent knife.  The anterior chamber was entered superotemporally with a Superblade and through the lamellar dissection with a 2.6 mm keratome.  DisCoVisc was used to replace the aqueous and a continuous tear capsulorrhexis was carried out.  Hydrodissection and hydrodelineation were carried out with balanced salt and a 27-gauge canula.  The nucleus was rotated to confirm the effectiveness of the hydrodissection.  Phacoemulsification was carried out using a divide-and-conquer technique.  Total ultrasound time was 2 minutes and 44 seconds with an average power of  27%. CDE of 70.87.   Irrigation/aspiration was used to remove the residual cortex.  DisCoVisc was used to inflate the capsule and the internal incision was enlarged to 3  mm with the crescent knife.  The intraocular lens was folded and inserted into the capsular bag using the Alcon Acrysert delivery system.  Cefuroxime 0.1 mL was injected via the paracentesis track, containing 1 mg of drug. Irrigation/aspiration was used to remove the residual DisCoVisc.   Miostat was injected into the anterior chamber through the paracentesis track to inflate the anterior chamber and induce miosis.  The wound was checked for leaks and none were found. The conjunctiva was closed with cautery and the bridle sutures were removed.  Two drops of 0.3% Vigamox were placed on the eye.   An eye shield was placed on the eye.  The patient was discharged to the recovery room in good condition.   ____________________________ Loura Back Fawna Cranmer, MD sad:ST D: 06/23/2014 13:39:52 ET T: 06/24/2014 00:22:27 ET JOB#: [id number]  cc: Remo Lipps A. Yoshito Gaza, MD, <Dictator> Martie Lee MD ELECTRONICALLY SIGNED 06/30/2014 13:36

## 2015-02-21 NOTE — Consult Note (Signed)
PATIENT NAME:  Denise Barry, Denise Barry MR#:  [id number] DATE OF BIRTH:  26-Jan-1934  DATE OF CONSULTATION:  01/12/2014  CONSULTING PHYSICIAN:  Claud Kelp, MD  HISTORY OF PRESENT ILLNESS: Denise Barry is an otherwise healthy 81 year old female who tripped and fell on her left hip earlier today with immediate pain and inability to bear weight. She was brought into the Emergency Room for further evaluation.   PAST MEDICAL HISTORY: Significant for trigeminal neuralgia and hypertension.   PAST SURGICAL HISTORY: Significant for posterior lumbar fusion procedure in November 2014 at [facility] with Dr. Louanne Skye.   FAMILY HISTORY:  Noncontributory.   PHYSICAL EXAMINATION: The patient is alert and oriented, in no acute distress. She is afebrile with stable vital signs. She has a shortened external rotation posture to her left lower extremity. She has easily palpable dorsalis pedis and posterior tibial pulses with intact sensation in all nerve distributions. She is nontender to palpation about her leg thigh or left upper extremity.   RADIOGRAPHS: X-rays taken of her left hip demonstrate a displaced femoral neck fracture.   ASSESSMENT: A 81 year old female with a displaced femoral neck fracture.   PLAN: The patient will be admitted to the medicine service for medical clearance and risk stratification.  The plan will be to take her to the operating room for left hip hemiarthroplasty once she has been medically optimized.     ____________________________ Claud Kelp, MD tte:dmm D: 01/12/2014 20:43:37 ET T: 01/13/2014 11:30:03 ET JOB#: [id number]  cc: Claud Kelp, MD, <Dictator> Claud Kelp MD ELECTRONICALLY SIGNED 01/13/2014 18:07

## 2015-02-21 NOTE — Op Note (Signed)
PATIENT NAME:  Denise Barry, Denise Barry MR#:  [id number] DATE OF BIRTH:  [birth year]  DATE OF PROCEDURE:  01/13/2014  PREOPERATIVE DIAGNOSIS: Left hip femoral neck fracture.   POSTOPERATIVE DIAGNOSIS:  Left hip femoral neck fracture.   PROCEDURE PERFORMED:  Left hip hemiarthroplasty.   SURGEON: Dawayne Patricia, M.D.   ASSISTANT: None.   ESTIMATED BLOOD LOSS: 250 mL.   URINE OUTPUT: 425 mL.   FLUID: 1300 mL.   ANESTHESIA: Spinal anesthesia with sedation.   COMPLICATIONS: None.   IMPLANTS USED: [employer] system.   INDICATIONS FOR PROCEDURE: Denise Barry is a 81 year old female who presented to the Emergency Room after sustaining a fall directly onto the left hip. She sustained the aforementioned fracture. After extensive discussion was had with Denise Barry and her family regarding risks and benefits of surgery, they decided to proceed with aforementioned interventions.   DESCRIPTION OF PROCEDURE: Denise Barry was identified in the preoperative holding area. The left hip was marked as operative site. Informed consent was reviewed.   The patient was brought into the Operating Room and spinal anesthesia was administered. The patient was placed on the table in the lateral decubitus position with the operative extremity up.  Operative extremity was prepared and draped in the usual sterile fashion. Surgical timeout was performed identifying the patient, procedure, laterality, imaging studies, confirming administration of preoperative antibiotics, confirming adequate skin preparation and confirming consent form.   Greater trochanter was palpated and marked. Standard posterolateral incision was made. Sharp dissection was carried down through the skin and subcutaneous tissue. The tensor fascia lata was identified over the lateral aspect of the proximal femur. This was incised and split distally. Blunt dissection was used to split the muscular fibers proximally. The hip was then internally rotated  and the bursal tissue overlying the trochanter was exposed. This was removed. The short external rotators were then exposed. The piriformis tendon was identified, transected and tagged. Theshort external rotator muscles were resected off the posterior aspect of the greater trochanter. The residual capsule was teed and tagged.   At this time, the femoral cutting guide was inserted and the femoral neck fracture was trimmed using a saw. Excess bone fragments were removed. Femoral cutting guide was removed. The femoral head was then identified and removed using a corkscrew. Acetabulum was irrigated for any residual debris.   Preparation of the femoral canal was started. The box osteotome was inserted. A canal finder was then used followed by a lateralizer. Sequential broaching was carried out until  a size 5 broach was inserted. Trial component of a +5, 48 mm head was applied and the hip was reduced and found to have excellent stability. At this time, all trial components were removed. The femoral canal was copiously irrigated and packed with Neo-Synephrine-soaked packing. At this time, cement was mixed. Cement restrictor was sized and a size 3 cement restrictor was inserted. The femoral canal was dried and the cement was inserted in a pressurized fashion. At this time, a size 5 Summit femoral stem, 12/14 taper, basic cemented was inserted with adequate anteversion being held until cement was dry. Excess cement was removed. Trial bipolar head components were again applied and the hip was reduced and found to be quite stable with equal leg lengths clinically. Trial components were removed. The joint was copiously irrigated. A size 28 mm, +5 femoral head with a self-centering 28 mm inner diameter and 48 mm outer diameter bipolar head was applied. Hip was reduced and again taken through full range  of motion. It was found to have excellent stability.   The joint was copiously irrigated. The capsule was closed using  tagged components. The piriformis muscle was reattached to the posterior aspect of the greater trochanter. A Hemovac drain was placed. The fascia was closed using #1 Vicryl suture. Subcutaneous tissue was closed using 2-0 Vicryl suture. Skin was closed using staples. Sterile dressings were applied. The patient was transferred into a supine position with an abduction pillow between the legs. Postoperative imaging demonstrates hip component in good alignment with no periprosthetic fracture or evidence of dislocation. The patient was neurovascularly intact on waking.    ____________________________ Dawayne Patricia, MD sr:cs D: 01/20/2014 16:17:00 ET T: 01/20/2014 20:11:28 ET JOB#: [id number]  cc: Dawayne Patricia, MD, <Dictator> Dawayne Patricia MD ELECTRONICALLY SIGNED 02/18/2014 11:51

## 2015-02-21 NOTE — H&P (Signed)
PATIENT NAME:  Denise Barry, Denise Barry MR#:  [id number] DATE OF BIRTH:  03/18/1934  DATE OF ADMISSION:  01/12/2014  REFERRING PHYSICIAN: Dr. Karma Greaser  PRIMARY CARE PHYSICIAN: Dr. Otilio Miu  CHIEF COMPLAINT: Hip pain.   HISTORY OF PRESENT ILLNESS: A 81 year old Caucasian female with past medical history of hypertension and trigeminal neuralgia presenting with hip pain. She suffered a mechanical fall while walking outside. She was stepping off of a curb and essentially fell. She had immediate pain over her left hip, rating it 10 out of 10, sharp, nonradiating, worsened with movement, no relieving factors. Upon arrival to the Emergency Department, with initial work-up, she was found to suffer a left femoral neck fracture. Currently, she is still complaining of pain although is somewhat improved after receiving morphine. Otherwise, no further complaints.   REVIEW OF SYSTEMS: CONSTITUTIONAL: Denies fever, chills, fatigue.  EYES: Denies blurred vision, double vision, eye pain.  EARS, NOSE, THROAT: Denies tinnitus, ear pain, hearing loss. RESPIRATORY: Denies cough, wheeze, shortness of breath.  CARDIOVASCULAR: Denies chest pain, palpitations, edema.  GASTROINTESTINAL: Denies nausea, vomiting, diarrhea, or abdominal pain.  GENITOURINARY: Dysuria or hematuria.  ENDOCRINE: Denies nocturia or thyroid problems.  HEMATOLOGIC AND LYMPHATIC: Denies easy bruising, bleeding.  SKIN: Denies rash or lesion.  MUSCULOSKELETAL: Positive for pain in left hip, as described above. Denies pain in neck, back, shoulders, and knees. Denies any arthritic symptoms.  NEUROLOGIC: Denies paralysis, paresthesias.  PSYCHIATRIC: Denies anxiety or depressive symptoms. Otherwise, full review of systems performed by me is negative.   PAST MEDICAL HISTORY: Hypertension, trigeminal neuralgia.   SOCIAL HISTORY: Denies any alcohol, tobacco, or drug usage.   FAMILY HISTORY: Positive for CVAs as well as diabetes.   ALLERGIES: CODEINE.    HOME MEDICATIONS: Include aspirin 81 mg p.o. daily, oxcarbazepine 300 mg p.o. b.i.d., hydrochlorothiazide 12.5 mg p.o. daily, and Centrum multivitamin 1 tablet daily.   PHYSICAL EXAMINATION: VITAL SIGNS: Temperature 97.8, heart rate 69, respirations 18, blood pressure 187/77, saturating 99% on room air. Weight 68 kilos. BMI 25.  GENERAL: Well-nourished, well-developed Caucasian female currently in no acute distress.  HEENT: Head normocephalic, atraumatic. Eyes: Pupils equal, round, and reactive to light. Extraocular movements intact. No scleral icterus. Mouth: Moist mucous membranes. Dentition intact. No abscess noted. Ear, nose and throat: clear without exudates. No external lesions.  NECK: Supple. No thyromegaly. No nodules. No JVD  PULMONARY: Clear to auscultation bilaterally without wheezes, rubs, or rhonchi. No use of accessory muscles. Good respiratory effort.  CHEST: Nontender palpation.  CARDIOVASCULAR: S1 and S2, regular rate and rhythm. No murmurs, rubs, or gallops. No edema. Pedal pulses 2+ bilaterally.  ABDOMEN: Soft, nontender, nondistended. No masses. Positive bowel sounds. No hepatosplenomegaly.  MUSCULOSKELETAL: Her left leg is shortened and externally rotated. There is no swelling, clubbing, or edema. Range of motion is limited in the left lower extremity secondary to pain with acute fracture. She does however have distal flexion and extension.  NEUROLOGIC: Cranial nerves II through XII intact. No gross focal neurological deficits. Sensation intact. Reflexes intact. SKIN: No ulcerations, lesions, rash, or cyanosis. Skin warm and dry. Turgor intact. PSYCH: Mood and affect within normal limits. The patient is awake, alert, and oriented x3. Insight and judgment intact.   DIAGNOSTIC DATA: EKG performed revealing normal sinus rhythm, heart rate of 95. No ST or T wave abnormalities.  X-ray of the left hip performed revealing acute left femoral neck fracture. Lumbar spine reveals old  L1 vertebral body compression fracture and osteopenia, but no acute findings.  Chest x-ray performed revealing no acute cardiopulmonary process.   Remainder of laboratory data: Sodium 131, potassium 3.5, chloride 93, bicarb 21, BUN 19, creatinine 0.84, glucose 138. Troponin I less than 0.02. WBC 16.8, hemoglobin 11.5, platelets 271. INR 0.9. Urinalysis negative for evidence of infection.   ASSESSMENT AND PLAN: A 81 year old Caucasian female with past medical history of hypertension presenting with hip pain after a mechanical fall.  1.  Preoperative evaluation for left femoral neck fracture. She should be considered a low to moderate risk from a cardiac implant for a moderate risk surgery. Her metabolic activity, her METS, are greater than or equal to 4. She has no chest pain, no anginal symptoms, no exertional symptoms, no active arrhythmias, and no active congestive heart failure symptoms. No further testing is required prior to surgery. We will add a bowel regimen as she will be taking pain medications. Will defer VTE prophylaxis as well as pain medicine to orthopedic surgery.  2.  Hypertension. Hold hydrochlorothiazide in the preoperative setting, add hydralazine p.r.n. for blood pressure greater than 180/100.  3.  Hyponatremia, mild. IV fluid hydration with normal saline and follow basic metabolic panel. This is most likely in the setting of hydrochlorothiazide.  4.  Venous thromboembolism prophylaxis with sequential compression devices.   CODE STATUS: FULL.  TIME SPENT: 45 minutes.  ____________________________ Aaron Mose. Hower, MD dkh:sb D: 01/12/2014 22:15:42 ET T: 01/13/2014 08:02:02 ET JOB#: [id number]  cc: Aaron Mose. Hower, MD, <Dictator> DAVID Woodfin Ganja MD ELECTRONICALLY SIGNED 01/13/2014 20:52

## 2015-02-21 NOTE — Consult Note (Signed)
Brief Consult Note: Diagnosis: Left femoral neck fracture.   Patient was seen by consultant.   Consult note dictated.   Recommend to proceed with surgery or procedure.   Comments: Spoke at length with patient and family regarding risks and benefits of surgery and expected course of rehabilitation afterwards. They demonstrate full understanding.  Will proceed with left hip hemiarthroplasty this afternoon.  Electronic Signatures: Dawayne Patricia (MD)  (Signed 16-Mar-15 08:55)  Authored: Brief Consult Note   Last Updated: 16-Mar-15 08:55 by Dawayne Patricia (MD)

## 2015-02-21 NOTE — Discharge Summary (Signed)
PATIENT NAME:  Denise Barry, Denise Barry MR#:  [id number] DATE OF BIRTH:  1933/11/28  DATE OF ADMISSION:  01/12/2014 DATE OF DISCHARGE:  01/16/2014  ADMITTING DIAGNOSIS: Status post fall with hip pain.   DISCHARGE DIAGNOSES: 1. Status post left hemiarthroplasty due to fracture.  2. Acute blood loss anemia due to a surgical loss.  3. Hyponatremia due to hydrochlorothiazide therapy, now better.  4. Hypertension, and blood pressure is staying normal. At this time no antihypertensives will be used.  5. History of trigeminal neurology.   PERTINENT LABS AND EVALUATIONS: Admitting glucose 138, BUN is 19, creatinine 0.84, sodium 131, potassium 3.5, chloride 93, CO2 is 28, magnesium 1.7. WBC 16.8, hemoglobin 11.5, platelet count was 271, INR 0.9.   Chest x-ray showed no acute cardiopulmonary process. Left hip view showed acute left femoral neck fracture.   HOSPITAL COURSE: Please refer to H and P done by admitting physician. The patient is a 81 year old white female with history of hypertension, trigeminal neuralgia with hip pain. He suffered a mechanical fall while walking outside. She stepped off the curb and then fell.  The patient was admitted with a left femoral neck fracture. She was noted to have some mild hyponatremia as a result of HCTZ therapy. She was given IV fluids and subsequently taken to the OR and successfully had hip hemiarthroplasty without any complications. The patient has been doing well and has been cleared by orthopedics for discharge. She was also noticed to have some acute blood loss anemia. Hemoglobin is stabilized. She will be treated with iron and  hemoglobin check in a few days. At this time, she is stable for discharge.   DISCHARGE MEDICATIONS: Oxcarbazepine 300 mg 1 tab p.o. b.i.d., . Acetaminophen 650 q.4 p.r.n. oxycodone 5 mg 1 tab p.o. q.4 hours p.r.n. for pain, iron sulfate 325 mg 1 tab p.o. b.i.d., Colace 100 mg 1 tab p.o. b.i.d., Lovenox 40 mg subcutaneous daily x 2 weeks.    DIET: Low sodium, low fat, low cholesterol.   ACTIVITY: As tolerated.   DISCHARGE FOLLOWUP: [facility], Dr. Joie Bimler in 2 weeks for staple removal.   Check hemoglobin in three days.   TIME SPENT: 35 minutes on this discharge.    ____________________________ Lafonda Mosses. Posey Pronto, MD shp:sg D: 01/16/2014 13:56:07 ET T: 01/16/2014 14:09:32 ET JOB#: [id number]  cc: Chasta Deshpande H. Posey Pronto, MD, <Dictator> Alric Seton MD ELECTRONICALLY SIGNED 01/17/2014 8:14

## 2015-03-01 NOTE — Discharge Summary (Signed)
PATIENT NAME:  Denise Barry, Denise Barry MR#:  [id number] DATE OF BIRTH:  [birth year]  DATE OF ADMISSION:  02/03/2015 DATE OF DISCHARGE:  02/06/2015  ADMITTING DIAGNOSIS: Nausea, vomiting, diarrhea.   DISCHARGE DIAGNOSES:  1.  Nausea, vomiting, and diarrhea due to acute colitis, suspected ischemic. Infectious colitis ruled out.  2.  Sepsis present on admission due to colitis.  3.  Hypotension related to her diarrhea. Blood pressure now normal.  4.  Elevated TSH. The patient started on low-dose Synthroid.  5.  Comminuted humeral fracture. Plan for surgery next Tuesday.  6.  Acute kidney injury from dehydration, now resolved with IV hydration.  7.  History of trigeminal neuralgia.   CONSULTANTS: Dr. Rudene Christians, Dr. Verdie Shire.  PERTINENT LABS AND EVALUATIONS: Admitting glucose 183, BUN 22, creatinine 1.53, sodium 139, potassium 3.7, chloride 101, CO2 24. Calcium 8.9. Lactic acid was elevated at 3.1. LFTs were normal. TSH was 10.176. WBC 20.1, hemoglobin 14.6, platelet count 322,000. INR 1. Blood cultures: No growth. Stool for C. diff was negative. Stool cultures: No growth. Urine cultures: No growth. Urinalysis: Nitrites negative, leukocytes 2+.  T3 was low at 55.  CT of the abdomen and pelvis showed distal colitis, likely infectious. Given pattern and history of hypertension, nonocclusive ischemic colitis is strongly differential, per radiology.   Left shoulder complete showed comminuted proximal left humeral fracture with 2 cm butterfly fragment.   HOSPITAL COURSE: Please refer to H and P done by the admitting physician. The patient is a 81 year old white female who presented to the hospital with nausea, vomiting, and diarrhea. The patient also had fallen. In the ER, she was noted to have elevated lactic acid. Creatinine was elevated and was hypotensive. Due to these symptoms, the patient was evaluated and had a CT scan of the abdomen which showed findings consistent with colitis. Due to her initial  presentation, there was concern that there may be sepsis as well. She was started on antibiotics. Stool studies for C. diff and other cultures, bacterial cultures, was ruled out and it was negative. The patient was started on antibiotics. There was also suspicion that her colitis was ischemic in nature. She was seen by gastroenterology. Supportive care was provided. Empiric Flagyl was given. The patient's diarrhea is now mostly resolved. She is doing well. She was also seen by orthopedics for her fall and shoulder fracture. The plan is for her to have surgery done and keep the sling on until 04/12 when she will be returned back to the hospital for surgery. The patient is very weak, therefore is requiring rehab. At this time, she is stable for discharge.   DISCHARGE MEDICATIONS: Iron sulfate 325 mg 1 tab p.o. b.i.d., oxcarbazepine 300 one tab p.o. t.i.d., Flagyl 500 one tab p.o. q. 8 hours x4 more days, acetaminophen/oxycodone 325/5 mg 1 tab p.o. q. 6 p.r.n. for moderate amount of pain, Synthroid 100 mcg daily.   DISCHARGE ACTIVITY: As tolerated with PT and with sling in place.   DISCHARGE INSTRUCTIONS: Follow up with MD at the skilled nursing facility in 1 to 2 weeks. The patient to return to the hospital for shoulder surgery on 04/12, Tuesday.  TIME SPENT ON THIS DISCHARGE: 45 minutes.   ____________________________ Lafonda Mosses Posey Pronto, MD shp:sb D: 02/06/2015 10:58:00 ET T: 02/06/2015 11:16:58 ET JOB#: [id number]  cc: Chester Romero H. Posey Pronto, MD, <Dictator> Alric Seton MD ELECTRONICALLY SIGNED 02/11/2015 12:14

## 2015-03-01 NOTE — Consult Note (Signed)
Pt seen and examined. Please see K.Jerelene Redden' notes. Pt with colitis. Already feeling better with Abx. Pt also has humerus fx. Would proceed with shoulder surgery when conveinient. Since patient had evidence of inflammation on previous colon done in 2008 ( path not available at this time) even though she had no symptoms then, would recommend repeating colonoscopy later as outpt once shoulder has healed from surgery.   Electronic Signatures: Verdie Shire (MD) (Signed on 06-Apr-16 15:00)  Authored   Last Updated: 06-Apr-16 15:00 by Verdie Shire (MD)

## 2015-03-01 NOTE — Consult Note (Signed)
PATIENT NAME:  Denise Barry, Denise Barry MR#:  [id number] DATE OF BIRTH:  07/05/1934  DATE OF CONSULTATION:  02/04/2015  REFERRING PHYSICIAN:   CONSULTING PHYSICIAN:  Laurene Footman, MD  REASON FOR CONSULTATION: Left shoulder fracture.   HISTORY OF PRESENT ILLNESS: The patient is a 81 year old who had gone out to dinner yesterday with friends.  When she got home, she had acute GI distress with diarrhea and vomiting. She was in her bed reaching for the garbage can to throw up and slipped off her bed landing on her left arm. She had immediate pain and knew something was wrong. She was brought to the Emergency Room where she was found to have a completely displaced proximal humerus fracture and also has been diagnosed with having acute colitis and being admitted for treatment of these.   PAST MEDICAL HISTORY: Remarkable for her having no prodromal symptoms of shoulder pain. She has been actives, takes care of herself, lives alone in a townhouse, is able to drive and go to the grocery store by herself. She is right-hand dominant.   REVIEW OF SYSTEMS: Positive for some abdominal discomfort. Denies any numbness, tingling in the arm.    PHYSICAL EXAMINATION: She has mild swelling to the left proximal arm. She is neurovascularly intact distally and in the sergeant's patch area. She has good grip strength.  Is able to extend the thumb and abduct the fingers with intact sensation as well to the median, ulnar, and superficial radial nerve branches at the hand.   DIAGNOSTIC DATA: X-rays reveal completely displaced surgical neck fracture with anterior medial displacement of the shaft.   IMPRESSION: Displaced proximal humerus fracture in an active 81 year old.   PLAN: I will be discussing case with Dr. Roland Rack and discuss possible open reduction and internal fixation and treatment options with the patient later today pending improvement of gastrointestinal condition.   ____________________________ Laurene Footman,  MD mjm:sp D: 02/04/2015 07:54:13 ET T: 02/04/2015 09:00:24 ET JOB#: [id number]  cc: Laurene Footman, MD, <Dictator> Laurene Footman MD ELECTRONICALLY SIGNED 02/04/2015 12:34

## 2015-03-01 NOTE — Consult Note (Signed)
Brief Consult Note: Diagnosis: Displaced 2-part proximal humerus fracture left shoulder.   Patient was seen by consultant.   Consult note dictated.   Recommend to proceed with surgery or procedure.   Comments: Pt. with displaced 2-part left proximal humerus fx best treated with ORIF. Pt. agreeable for this procedure to be performed. I will arrange to do this next Tuesday, 4/12, around noon.  The procedure has been discussed in detail with the patient, as have the potential risks and benefits. The patient agrees to proceed.  Electronic Signatures: Dorien Chihuahua (MD)  (Signed 07-Apr-16 16:30)  Authored: Brief Consult Note   Last Updated: 07-Apr-16 16:30 by Dorien Chihuahua (MD)

## 2015-03-01 NOTE — Op Note (Signed)
PATIENT NAME:  Denise Barry, Denise Barry MR#:  [id number] DATE OF BIRTH:  12/31/1933  DATE OF PROCEDURE:  02/10/2015  PREOPERATIVE DIAGNOSIS: Displaced 2-part left proximal humerus fracture.   POSTOPERATIVE DIAGNOSIS: Displaced 2-part left proximal humerus fracture.   PROCEDURE: Open reduction and internal fixation of left proximal humerus fracture using the Biomet 3-hole proximal humeral locking fracture plate.   SURGEON:  Pascal Lux, MD   ANESTHESIA: General endotracheal with an interscalene block placed preoperative by the anesthesiologist.   FINDINGS: As noted above.   COMPLICATIONS: None.   ESTIMATED BLOOD LOSS: 100 mL.  TOTAL FLUIDS: Crystalloid 800 mL.   URINE OUTPUT: 500 mL.  TOURNIQUET: None.   DRAINS: None.   CLOSURE: Staples.   BRIEF CLINICAL NOTE: The patient is a 81 year old female who sustained the above noted injury last week when she fell onto her left  side. She presented to the Emergency Room where x-rays demonstrated the above noted findings. She was admitted for pain management and GI work-up as she was having some difficulty with diarrhea and vomiting. These conditions have stabilized. She presents at this time for definitive management of her left proximal humerus fracture.   PROCEDURE IN DETAIL: The patient underwent placement of a supraclavicular block in the preoperative holding area. She was then brought into the operating room and lain in the supine position. After adequate general endotracheal intubation and anesthesia were obtained, a Foley catheter was placed. The patient was repositioned in the beach chair position using the beach chair positioner. The left shoulder and upper extremity were prepped with ChloraPrep solution before being draped sterilely. Preoperative antibiotics were administered. A standard anterior approach to the shoulder was made through an approximately 4-5 inch incision. The incision was carried down through the subcutaneous tissues to  expose the deltopectoral fascia. The interval between the deltoid and pectoralis muscles was identified and this plane developed bluntly, retracting the cephalic vein laterally with the deltoid. The conjoined tendon was identified and retracted medially. The proximal end of the humerus was identified. The biceps tendon was identified and traced along the length of the bicipital groove. This was used as a landmark to reduce the fracture. The fracture was reduced with gentle manipulation. The adequacy of fracture reduction was verified fluoroscopically in AP and lateral projections and found to be excellent. The short proximal humerus plate was selected and applied to the lateral anterolateral aspect of the proximal humerus. The plate was temporarily secured using a guide pin placed in the center of the humeral head. Once this position was verified fluoroscopically in AP and lateral projections and found to be excellent, the plate was secured distally with a bicortical screw. Again, plate position and fracture reduction was verified fluoroscopically in AP and lateral projections and found to be excellent. The plate was secured to the humeral head using 6 locking pins, each drilled and measured to the appropriate length. Distally, the plate was secured using 2 additional bicortical screws. Again, the adequacy of fracture reduction in AP, lateral, and oblique projections was observed fluoroscopically and found to be excellent. The pins were located in the humeral head appropriately and none appeared to be penetrating the humeral cortical articular cortex in any projection. In addition, an axillary view was obtained which also showed no penetration of the pins. Two #2 FiberWire sutures were  placed through the rotator cuff at  its junction with the greater tuberosity and brought back laterally through the preplaced  suture holes in the plate and tied securely to  better stabilize the proximal fragment.   The wound was  copiously irrigated with sterile saline solution before the deltopectoral interval was reapproximated using 2-0 Vicryl interrupted sutures.   The subcutaneous tissues were closed in two layers using 2-0 Vicryl interrupted sutures before the skin was closed using staples. A sterile bulky dressing was applied to the shoulder before the arm was placed into a shoulder immobilizer. The patient was then awakened, extubated, and returned to the recovery room in satisfactory condition after tolerating the procedure well.     ____________________________ J. Dorien Chihuahua, MD jjp:tr D: 02/10/2015 15:26:43 ET T: 02/10/2015 21:01:48 ET JOB#: [id number]  cc: Pascal Lux, MD, <Dictator> Pascal Lux MD ELECTRONICALLY SIGNED 02/17/2015 14:48

## 2015-03-01 NOTE — Discharge Summary (Signed)
PATIENT NAME:  Denise Barry, Denise Barry MR#:  [id number] DATE OF BIRTH:  [birth year]  DATE OF ADMISSION:  02/10/2015 DATE OF DISCHARGE: 02/11/2015  DISCHARGE/TRANSFER SUMMARY  Please see admission history and physical for full details regarding history of present illness,  past medical history, review of systems, physical examination, admission x-ray data.  The patient was admitted on 02/10/2015 after undergoing an uncomplicated open reduction and internal fixation of her left proximal humerus fracture. Her postoperative course was unremarkable. Her pain initially was well controlled with a combination of the block and IV and p.o. pain medication. Subsequently, her pain has been well controlled with p.o. pain medication. Her Foley catheter was removed on the first postoperative day. She has not yet voided, but is expected to do so. She is tolerating p.o.  She has been ambulating in the halls without difficulty. Her dressing was changed and an OpSite dressing applied. Her wound appears to be healing well and is showing no evidence for infection. Her lab work this morning showed a Chem-7 within normal limits.   IMPRESSION: Status post open reduction and internal fixation of left proximal humerus fracture.   PLAN: The patient is expected to be ready for transfer to rehabilitation for further convalescence pending bed availability later today. She may weight-bear as tolerated on both lower extremities, walking the halls with assistance. She is to keep her shoulder immobilizer on the left upper extremity at all times, removing it only for bathing purposes and for exercises. She will receive occupational therapy to work on active and passive hand, wrist, and elbow range of motion, but is to limit her shoulder motion to gentle pendulum exercises passively. She may shower with an intact OpSite dressing. The dressing is to be changed on an as necessary basis. She will continue to receive oxycodone and/or tramadol as  necessary for pain. She will be seen back in the office in 10-14 days, at which time she will have her staples removed. Depending on x-rays at this time, she may start more aggressive therapy for her left shoulder.    ____________________________ Lenna Sciara. Dorien Chihuahua, MD jjp:LT D: 02/11/2015 16:14:00 ET T: 02/11/2015 16:27:08 ET JOB#: [id number]  cc: Pascal Lux, MD, <Dictator> Pascal Lux MD ELECTRONICALLY SIGNED 02/17/2015 14:50

## 2015-03-01 NOTE — H&P (Signed)
PATIENT NAME:  Denise Barry, Denise Barry MR#:  [id number] DATE OF BIRTH:  October 21, 1934  DATE OF ADMISSION:  02/03/2015  CHIEF COMPLAINT: Nausea and vomiting.   HISTORY OF PRESENT ILLNESS: This is a 81 year old female who was out for lunch with family early on in of the day of admission. After having a meal, she got nauseated and had vomiting and diarrhea. She was back at home. The symptoms around 2:30 or 3:00 p.m. She was back at home afterwards, toward the evening she was in the bed, and she ended up falling off the bed and actually fracturing her left upper humerus. As far as the GI symptoms go, the patient states that she had a fairly similar episode of GI upset about 2 weeks ago that lasted for 3 days. She denies any blood in her stools or emesis throughout all this. She did have some abdominal pain for about 3-4 hours until she came to the ED and received pain medication. She states that she did not have any fevers or chills. Denies any recent antibiotic use. Basically, review of systems is otherwise largely negative; see full official review of systems below. In the ED, she was found to have a white count very elevated to 20 and found to have a lactic acid of 3.1, creatinine of 1.53, which is elevated from her baseline. On the CT abdomen and pelvis, was found to have distal colitis, likely infectious. The patient met sepsis criteria on guidelines with an elevated white count and elevated respiratory rate. In the ED, she was placed on antibiotics, given fluids and had a good response of her blood pressure with those fluids and hospitalists were called for admission.   PRIMARY CARE PHYSICIAN: Juline Patch, MD   PAST MEDICAL HISTORY: Hypertension and trigeminal neuralgia.   CURRENT MEDICATIONS: Oxcarbazepine 300 mg t.i.d., HCTZ/lisinopril combination 12.5/10 mg daily, HCTZ 12.5 mg daily, aspirin 81 mg daily, iron, Colace and Tylenol p.r.n.   PAST SURGICAL HISTORY: Back surgery, left hip replacement.    ALLERGIES: CODEINE CAUSES GI DISTRESS.   FAMILY HISTORY: Stroke, diabetes, COPD.   SOCIAL HISTORY: Ex-smoker, quit 6 years ago, nondrinker, denies illicit drug use.   REVIEW OF SYSTEMS:  CONSTITUTIONAL: Denies fever, fatigue, weakness.  EYES: Denies blurred or double vision, pain or redness.  EAR, NOSE, AND THROAT: Denies ear pain, hearing loss, difficulty swallowing.  RESPIRATORY: Denies cough, dyspnea, or painful respirations.  CARDIOVASCULAR: Denies chest pain, edema, palpitations.  GASTROINTESTINAL: Endorses nausea, vomiting, diarrhea, abdominal pain.  GENITOURINARY: Denies dysuria, hematuria, or frequency.  ENDOCRINE: Denies nocturia, thyroid problems, heat or cold intolerance.  HEMATOLOGIC AND LYMPHATIC: Denies easy bruising, bleeding, swollen glands.  INTEGUMENTARY: Denies acne, rash, lesion.  MUSCULOSKELETAL: Denies arthritis, swelling, gout.  NEUROLOGICAL: Denies numbness, weakness, headache.  PSYCHIATRIC: Denies anxiety, insomnia, depression.   PHYSICAL EXAMINATION:  VITAL SIGNS: Blood pressure 116/68, pulse 98, temperature 97.3, respirations 23, oxygen saturations 97% on room air.  GENERAL: This is an elderly lady, well nourished who is lying supine in bed in no acute distress currently.  HEENT: Pupils equal, round, react to light and accommodation. Extraocular movements intact. No scleral icterus. Moist mucosal membranes.  NECK: Thyroid not enlarged. Neck is supple, nontender with no masses. No cervical adenopathy. No JVD.  RESPIRATORY: Clear to auscultation bilaterally with no rhonchi, rales, or wheezing. No respiratory distress.  CARDIOVASCULAR: Borderline tachycardia, regular rhythm with no murmurs, rubs, or gallops auscultated on exam. Good pedal pulses with no lower extremity edema.  ABDOMEN: Soft, diffusely tender, worse  in the lower quadrants, nondistended with good bowel sounds.  MUSCULOSKELETAL: She has 5/5 muscular strength in bilateral lower extremities and  right upper extremity with full spontaneous range of motion of those extremities. Her left upper extremity is currently in a sling. She has no cyanosis or clubbing.  SKIN: No rash or lesions. Skin is warm, dry, and intact.  LYMPHATIC: No adenopathy.  NEUROLOGICAL: Cranial nerves intact. Sensation intact throughout. No dysarthria or aphasia.  PSYCHIATRIC: Alert and oriented x 3, cooperative with good insight.   LABORATORY DATA: White count 20.1, hemoglobin 14.6, hematocrit 45.6, platelet count 322,000. Sodium 139, potassium 3.7, chloride 101, CO2 of 24, BUN 22, creatinine 1.53, glucose 183, calcium 8.9, magnesium 2.5, alkaline phosphatase 3.9, lipase 80, lactic acid 3.1. Total protein 6.7, albumin 4.0, total bilirubin 0.7, alkaline phosphatase 91, AST 37, ALT 19. Troponin less than 0.03 TSH 10.176, that is elevated. INR of 1. Clostridium difficile assay was negative.   RADIOLOGY: Chest x-ray and left shoulder x-ray both showed comminuted proximal left humerus fracture with 2 cm butterfly fragment, 1 full shaft width of anterior displacement and overriding of 3-4 cm   ASSESSMENT AND PLAN:  1.  Infectious colitis. The patient was placed on antibiotics for this. She will be moved to the floor. She was given good fluid resuscitation and responded well to that and we will continue to monitor. We will leave her on her antibiotics at this time, Levaquin and Flagyl. Stool cultures have been ordered. She is Clostridium difficile negative. We could consider a gastroenterology consult tomorrow depending on how she is doing.  2.  Sepsis. This is not serious sepsis or septic shock, given the patient's good response to fluids. She does meet sepsis criteria given her infectious colitis on scan, elevated white count, and elevated respiratory rate. Continue antibiotics. Continue fluids for now. Monitor her lactic acid. She was pancultured. We will follow those cultures.  3.  Left humerus fracture. Orthopedics was  consulted for this in the ED. They will see the patient in the morning and we will follow their recommendations for this.  4.  Acute kidney injury. Likely due to the significant diarrhea the patient has had as well as the vomiting. The patient is getting good hydration with fluids. We will monitor this for improvement. This is likely prerenal.  5.  Hypertension. This is a chronic problem. The patient's blood pressure is actually running on the low end here, although she did have good response to fluid. She is currently not hypertensive. Will hold her antihypertensives and restart them p.r.n. as her situation improves.  6.  Trigeminal neuralgia. This is a stable chronic problem. We will continue patient's home medications for this.  7.  Deep vein thrombosis prophylaxis. Subcutaneous heparin.   CODE STATUS: This patient is full code.   TIME SPENT ON THIS ADMISSION: 50 minutes.    ____________________________ Wilford Corner. Jannifer Franklin, MD dfw:bm D: 02/04/2015 01:29:13 ET T: 02/04/2015 03:36:07 ET JOB#: [id number]  cc: Wilford Corner. Jannifer Franklin, MD, <Dictator> Peniel Hass Fawn Kirk MD ELECTRONICALLY SIGNED 02/04/2015 4:18

## 2015-03-01 NOTE — Consult Note (Signed)
PATIENT NAME:  Denise Barry, Denise Barry MR#:  [id number] DATE OF BIRTH:  11-29-1933  DATE OF CONSULTATION:  02/05/2015  REFERRING PHYSICIAN:  Hessie Knows, MD CONSULTING PHYSICIAN:  Pascal Lux, MD  REASON FOR CONSULTATION: I have been asked by Dr. Posey Pronto and Dr. Rudene Christians to evaluate this pleasant woman for a left shoulder injury.   HISTORY OF PRESENT ILLNESS: Briefly, she is a 81 year old female in otherwise good health, who was out with her family for lunch on 02/03/2015. She developed nausea and vomiting after the meal. When she got back to her house, she had gotten back into bed to try to get herself to feel better. She got up suddenly to vomit. As she was reaching for a garbage can, she slipped off the bed and landed on her left shoulder, injuring her left shoulder. She was brought to the Emergency Room, where x-rays demonstrated a 2-parted displaced left proximal humerus fracture. She was admitted for further work-up of her GI symptoms. The arm was placed in a shoulder immobilizer. Dr. Rudene Christians initially with consulted on this patient. He evaluated the patient and the x-rays and felt that the patient would most benefit by surgical intervention and asked me to see the patient.   PAST MEDICAL HISTORY: As noted above. She has a history of hypertension and trigeminal neuralgia, otherwise appears to be in good health.   PAST SURGICAL HISTORY: Notable for undergoing an back surgery 2 years ago and a partial left hip replacement last year for a femoral neck fracture.   PHYSICAL EXAMINATION: GENERAL: Pleasant, elderly female resting comfortably in bed. She appears to be alert and oriented x 3. Orthopedic examination is limited to the left shoulder and upper extremity. The left upper extremity is in a shoulder immobilizer. She has moderate swelling and ecchymosis over the anterior aspect of the shoulder, extending into the upper arm. She is able to actively flex and extend all digits of her left hand, as well as  flex and extend her wrist. She also has slight elbow flexion, actively; although, this does cause discomfort. She has intact sensation to light touch over all distributions of the left upper extremity, including axillary nerve distribution and the endobronchial cutaneous and lateral antebrachial cutaneous nerve distributions.   IMAGING STUDIES: X-rays of the left shoulder are available for review. The findings were as described above.   IMPRESSION: A displaced two-part extra-articular surgical neck fracture, left proximal humerus.   PLAN: The treatment options are discussed with the patient. She would like to proceed with open reduction and internal fixation of the left proximal humerus fracture. The procedure has been discussed in detail with the patient, as have the potential risks (including bleeding, infection, nerve and/or blood vessel injury, persistent or recurrent pain, stiffness, malunion and/or nonunion, need for further surgery, blood clots, strokes, heart attacks and/or arrhythmias, etc.) and benefits. The patient states her understanding and wishes to proceed. This procedure will be scheduled for Tuesday, 02/10/2015. As I understand it, the patient is to be discharged to rehab for over the weekend. She will then return Tuesday morning for her surgery.   Thank you for asking me to participate in the care of this most pleasant woman. I will be happy to keep you abreast of her progress.     ____________________________ Lenna Sciara. Dorien Chihuahua, MD jjp:mw D: 02/05/2015 16:36:35 ET T: 02/05/2015 16:47:36 ET JOB#: [id number]  cc: Pascal Lux, MD, <Dictator> Pascal Lux MD ELECTRONICALLY SIGNED 02/17/2015 14:42

## 2015-03-01 NOTE — Consult Note (Signed)
Brief Consult Note: Diagnosis: comminuted proximal humerus fracture.   Comments: will review with Dr Roland Rack.  Electronic Signatures: Laurene Footman (MD)  (Signed 05-Apr-16 21:03)  Authored: Brief Consult Note   Last Updated: 05-Apr-16 21:03 by Laurene Footman (MD)

## 2015-03-01 NOTE — Op Note (Signed)
PATIENT NAME:  Denise Barry, RALLO MR#:  [id number] DATE OF BIRTH:  [birth year]  DATE OF PROCEDURE:  11/10/2014  PREOPERATIVE DIAGNOSIS: Nuclear sclerotic cataract, right eye.   POSTOPERATIVE DIAGNOSIS: Nuclear sclerotic cataract, right eye.  PROCEDURE PERFORMED: Extracapsular cataract extraction using phacoemulsification with an [id number], 20.5 diopter posterior chamber lens, serial number [id number].   SURGEON: Loura Back. Izola Teague, MD   ANESTHESIA: 4% lidocaine and 0.75% Marcaine in a 50:50 mixture with 10 units of Hylenex  added, given as a peribulbar block.   ANESTHESIOLOGIST: Alvina Filbert. Andree Elk, MD  COMPLICATIONS: None.   ESTIMATED BLOOD LOSS: Less than 1 mL.   DESCRIPTION OF PROCEDURE:  The patient was brought to the operating room and given a peribulbar block.  The patient was then prepped and draped in the usual fashion.  The vertical rectus muscles were imbricated using 5-0 silk sutures.  These sutures were then clamped to the sterile drapes as bridle sutures.  A limbal peritomy was performed extending two clock hours and hemostasis was obtained with cautery.  A partial thickness scleral groove was made at the surgical limbus and dissected anteriorly in a lamellar dissection using an Alcon crescent knife.  The anterior chamber was entered superonasally with a Superblade and through the lamellar dissection with a 2.6 mm keratome.  DisCoVisc was used to replace the aqueous and a continuous tear capsulorrhexis was carried out.  Hydrodissection and hydrodelineation were carried out with balanced salt and a 27 gauge canula.  The nucleus was rotated to confirm the effectiveness of the hydrodissection.  Phacoemulsification was carried out using a divide-and-conquer technique.  Total ultrasound time was 1 minute and 14 seconds with an average power of 25.1%. CDE of 31.02.  Irrigation/aspiration was used to remove the residual cortex.  DisCoVisc was used to inflate the capsule and the internal  incision was enlarged to 3 mm with the crescent knife.  The intraocular lens was folded and inserted into the capsular bag using the AcrySert delivery system.  Irrigation/aspiration was used to remove the residual DisCoVisc.   Miostat was injected into the anterior chamber through the paracentesis tract to inflate the anterior chamber and induce miosis.  Cefuroxime, 0.1 mL, containing 1 mg of drug was injected via the paracentesis tract. The wound was checked for leaks and none were found. The conjunctiva was closed with cautery and the bridle sutures were removed.  Two drops of 0.3% Vigamox were placed on the eye.  An eye shield was placed on the eye.  The patient was discharged to the recovery room in good condition.  ____________________________ Loura Back Leonela Kivi, MD sad:MT D: 11/10/2014 13:21:47 ET T: 11/10/2014 14:27:08 ET JOB#: [id number]  cc: Remo Lipps A. Kaylin Marcon, MD, <Dictator> Martie Lee MD ELECTRONICALLY SIGNED 11/12/2014 16:21

## 2015-03-01 NOTE — Consult Note (Signed)
Chief Complaint:  Subjective/Chief Complaint Continues to feel better. Less abdominal pain. Less diarrhea. Stool studies neg so far.   VITAL SIGNS/ANCILLARY NOTES: **Vital Signs.:   07-Apr-16 12:04  Vital Signs Type Routine  Temperature Temperature (F) 97.6  Celsius 36.4  Temperature Source oral  Pulse Pulse 57  Respirations Respirations 18  Systolic BP Systolic BP 283  Diastolic BP (mmHg) Diastolic BP (mmHg) 72  Mean BP 87  Pulse Ox % Pulse Ox % 95  Pulse Ox Activity Level  At rest  Oxygen Delivery Room Air/ 21 %   Brief Assessment:  GEN no acute distress   Cardiac Regular   Respiratory clear BS   Gastrointestinal Normal   Lab Results: Routine UA:  07-Apr-16 08:30   Color (UA) Yellow  Clarity (UA) Hazy  Glucose (UA) Negative  Bilirubin (UA) Negative  Ketones (UA) Trace  Specific Gravity (UA) 1.020  Blood (UA) 2+  pH (UA) 5.0  Protein (UA) Negative  Nitrite (UA) Negative  Leukocyte Esterase (UA) 2+ (Result(s) reported on 05 Feb 2015 at 08:52AM.)  RBC (UA) 3 /HPF  WBC (UA) 11 /HPF  Bacteria (UA) TRACE  Epithelial Cells (UA) 6 /HPF  Mucous (UA) PRESENT (Result(s) reported on 05 Feb 2015 at 08:52AM.)   Assessment/Plan:  Assessment/Plan:  Assessment Acute colitis. Suspect more ischemic colitis than infectious. Anyway, treatment is essentially same. Previus colonoscopy bx seems to suggest this.   Plan Advance diet as tolerated. Should be able to discharge patient by tomorrow unless condition changes. Shoulder surgery next Tues. Would still like to plan colonoscopy a month or so after shoulder surgery. Will sign offf. thanks.   Electronic Signatures: Verdie Shire (MD)  (Signed 07-Apr-16 14:32)  Authored: Chief Complaint, VITAL SIGNS/ANCILLARY NOTES, Brief Assessment, Lab Results, Assessment/Plan   Last Updated: 07-Apr-16 14:32 by Verdie Shire (MD)

## 2015-03-01 NOTE — Consult Note (Signed)
PATIENT NAME:  Denise Barry, Denise Barry MR#:  [id number] DATE OF BIRTH:  1933/11/15  DATE OF CONSULTATION:  02/04/2015  REFERRING PHYSICIAN:   CONSULTING PHYSICIAN:  Joelene Millin A. Jerelene Redden, ANP (Adult Nurse Practitioner)  REFERRING PHYSICIAN:  Dr. Jannifer Franklin.    CONSULTING PHYSICIAN:  Verdie Shire, MD/Blannie Shedlock Jerelene Redden, ANP.    PRIMARY CARE PHYSICIAN: Otilio Miu, MD.   REASON FOR CONSULTATION:  Infectious colitis.   HISTORY OF PRESENT ILLNESS: This 81 year old patient with history of hypertension, trigeminal neuralgia, reports she was in good health until yesterday after lunch. She dined at a [employer] on meat loaf, mashed potatoes, and green beans, and could barely make it out the door and home before she had severe rolling stomach, nausea, repetitive vomiting, and repetitive non bloody watery  diarrhea and chills. She was in the bathroom for about an hour and felt extremely ill. She made it to bed and at about 4:30 she leaned over to vomit in a trash can and she slipped and slid off the side of her high bed and landed on her shoulder. The patient had severe shoulder pain, was weak, and her family found her on the floor.   She was brought to the hospital where she was diagnosed with left humerus fracture, sepsis as with elevated white count, respiratory rate, lactic acid, and what appears to be infectious colitis on CT study. She was given IV fluids, IV Levaquin, and Flagyl, and was seen by Dr. Rudene Christians from orthopedics.   The patient reports she is feeling much improved now. Her last emesis was yesterday while drinking the contrast dye with the CT. Her last diarrhea was about 9:30 this morning. She has not seen any blood or melena. She will ahve a few smears of stool when she voids. She has no abdominal pain or rolling stomach now. She is taking clear liquid diet without difficulty. She has been unaware of any fever.   The patient does note that she had similar symptoms 2 weeks ago unrelated to eating. She  developed abrupt onset of chills with rigor,  nausea, vomiting, and diarrhea and felt severely ill. These symptoms lasted about 3 days and spontaneously resolved without therapy. The patient states she would have stayed at home with these symptoms again except for the arm fracture.   Chart review shows she did have a colonoscopy 06/29/2007 for screening purposes and it showed diverticulosis in the sigmoid colon and descending colon without evidence of diverticular bleeding. There was diffuse inflammation characterized by granularity found in the sigmoid colon, transverse colon, ascending colon, and in the cecum. There was a small hyperplastic colon polyp removed from the transverse colon. There was ulcerated mucosa with stigmata of recent bleeding present in the proximal ascending colon.  The pathology returned on the terminal ileum showed small bowel mucosa with well-formed villi, no ulceration or granulomata seen. The proximal and ascending colon ulceration showed exudative active colitis with lymphoid nodule, no granulomata seen in the histologic sections. The cecum showed normal mucosa. The cecum, ascending colon, transverse colon, descending colon, sigmoid colon, and rectum all showed normal mucosa. The etiology of the inflammatory process was not apparent. Ischemic colitis was not excluded.   With this history, the patient was questioned regarding episodic abdominal pain, diarrhea, or rectal bleeding. She denies all. She reports normal formed stool every day, no isolated areas of abdominal pain. With her event of vomiting and diarrhea 2 weeks ago and yesterday her abdominal pain was diffuse, rolling, nonspecific, and she saw no  blood or melena. She reports normal appetite, diet, and weight.   PAST MEDICAL HISTORY:  1.  Hypertension.  2.  Trigeminal myalgia.   CURRENT MEDICATIONS:  1. Oxcarbazepine 300 mg 3 times daily.  2. HCTZ/lisinopril 12.5/10 mg daily.  3. Aspirin 81 mg daily.  4. Iron which  she was placed on from her admission at [facility] March of 2015, last year.  5. Colace.  6. Tylenol as needed.   She avoids nonsteroidal anti-inflammatory drugs  ALLERGIES: CODEINE CAUSES GI DISTRESS.   FAMILY HISTORY: Negative for colon cancer, colon polyps, or GI malignancy. Positive for stroke, diabetes, COPD.    SOCIAL HISTORY: Ex-smoker, quit 6 years ago. Negative alcohol or illicit drug use.   REVIEW OF SYSTEMS:  Ten systems reviewed. Pertinent positives noted in the history of present illness.   PHYSICAL EXAMINATION:  VITAL SIGNS: 97.9-99.2 T-max today, heart rate 103, respirations 18, 109/68, pulse oximetry on room air is 95%.  GENERAL: Well-appearing, well-nourished Caucasian female resting in bed, looks comfortable, NAD. The patient is conversive.  HEENT: Head is normocephalic. Conjunctivae pink. Sclerae are anicteric. Oral mucosa is moist and intact.  NECK: Supple. Trachea midline. Thyroid is not enlarged.  RESPIRATORY: Lungs are CTA. Respirations are regular and easy.  CARDIOVASCULAR: Positive tachycardia noted, regular rhythm, without obvious murmur.  ABDOMEN: Soft, nontender in all quadrants. She has good bowel sounds throughout. No HSM or masses.  RECTAL: Deferred.  SKIN: Warm and dry without lesions.  MUSCULOSKELETAL: She is moving extremities, left arm is in a sling, ice pack on her left humeral fracture. This area was not examined. The patient was rolled to her right side for lung auscultation.  NEUROLOGIC: Cranial nerves II through XII grossly intact. Speech is clear. Extremity movement x 4.  PSYCHIATRIC: Affect and mood within normal. The patient reports she is comfortable.   LABORATORY DATA: Admission laboratory work notable for glucose 183, BUN 22, creatinine 1.53, magnesium 2.5, lipase 80, lactic acid 3.1, now down to 1.3. Liver panel normal. Troponin negative. TSH elevated at 10.176. WBC 20.1, now down at 14.9, hemoglobin 14.6, platelets 322,000. Pro time 13.3, INR  1.0. Blood culture negative. Stool culture holding. Clostridium difficile negative. Admission ABG with pH 7.330, pCO2 of 43, pO2 of 51, base excess -3, bicarbonate 22.7, FiO2 room air with O2 saturation 87.5%.   Repeat laboratory studies today with BUN 27, creatinine of 1.18. Free thyroxine 0.90. WBC 14.9, hemoglobin 11.4, down from 14.6 with hydration.   RADIOLOGY: CT of the abdomen and pelvis with contrast performed 02/03/2015 showed marked circumferential thickening of the descending and sigmoid colon. Fluid levels present throughout the colon without obstruction. Distal colonic diverticulosis without focal inflammation. The appendix is negative. No definite small bowel thickening. No major mesenteric vessel occlusion. No mass or adenopathy. Trace reactive pelvic fluid. No pneumoperitoneum. Impression by the radiologist is distal colitis, likely infectious given the pattern and history of hypotension, nonocclusive ischemic colitis is a strong differential consideration.   ASSESSMENT AND PLAN:  The patient presents with acute sudden onset of severe nausea and vomiting, rolling abdominal discomfort, nonspecific, abdominal pain, chills described as  rigor. The patient became severely ill immediately after eating lunch at a restaurant. The patient unfortunately slipped off her bed and fractured her proximal humerus. Laboratory studies found her to be hypotensive, significant elevated white count of 20, lactic acid 3.1, creatinine 1.53 which is elevated from her baseline. CT showed distal colitis with differential infection versus ischemic colitis. The patient did meet the sepsis  criteria guidelines, therefore she was placed in the hospital and given IV Levaquin and Flagyl. She has shown marked improvement overnight with her abdominal pain symptoms, lessening diarrhea and vomiting. She is now on oral Cipro and Flagyl. Vital signs show a slight temperature of 99.2, heart rate a little tachycardia, blood pressure  in more of a normal range of 109/68. The patient does not appear toxic. Abdominal exam is negative.   Previous colonoscopy 2008 showed colitis and pathology was reviewed, showing that she had active colitis with lymphoid nodule, no granulomata seen in the ulcerated area of the proximal ascending colon. The pathologist mentions that ischemic colitis cannot be excluded in 2008. The patient reports she has had absolutely no problems with her colon until yesterday and 2 weeks ago when she had abrupt onset  of these severe symptoms.   The possibility of viral or bacterial gastroenteritis has been raised. She has tested negative for Clostridium difficile and blood culture negative. General stool culture is still pending. There is a lot of viral gastroenteritis going through the community. However, this patient presents with an acute- severe  illness out of the ordinary and significant laboratory changes associated with sepsis. Ischemic colitis is high on the differential list. The patient is showing marked improvement very quickly which can also support ischemic colitis.    PLAN:  1.  Continue with antibiotic therapy.  2.  Continue monitoring laboratory studies and vital signs.  3.  Consider outpatient colonoscopy for further evaluation.  4.  Consider vascular ultrasound of mesentery as outpatient.   This case was discussed with Dr. Verdie Shire in collaboration of care.   These services provided by Joelene Millin A. Jerelene Redden, MS, APRN, BC, ANP under collaborative agreement with Verdie Shire, MD.     ____________________________ Janalyn Harder Jerelene Redden, ANP (Adult Nurse Practitioner) kam:bu D: 02/04/2015 15:25:20 ET T: 02/04/2015 16:16:24 ET JOB#: [id number]  cc: Joelene Millin A. Jerelene Redden, ANP (Adult Nurse Practitioner), <Dictator> Janalyn Harder Sherlyn Hay, MSN, ANP-BC Adult Nurse Practitioner ELECTRONICALLY SIGNED 02/04/2015 17:32

## 2015-03-10 ENCOUNTER — Ambulatory Visit: Payer: Medicare Other | Admitting: Internal Medicine

## 2015-03-17 DIAGNOSIS — I10 Essential (primary) hypertension: Secondary | ICD-10-CM | POA: Diagnosis not present

## 2015-03-17 DIAGNOSIS — Z4789 Encounter for other orthopedic aftercare: Secondary | ICD-10-CM | POA: Diagnosis not present

## 2015-03-17 DIAGNOSIS — E039 Hypothyroidism, unspecified: Secondary | ICD-10-CM | POA: Diagnosis not present

## 2015-03-23 DIAGNOSIS — S42222A 2-part displaced fracture of surgical neck of left humerus, initial encounter for closed fracture: Secondary | ICD-10-CM | POA: Diagnosis not present

## 2015-04-08 ENCOUNTER — Telehealth: Payer: Self-pay | Admitting: *Deleted

## 2015-04-08 ENCOUNTER — Telehealth: Payer: Self-pay

## 2015-04-08 DIAGNOSIS — I10 Essential (primary) hypertension: Secondary | ICD-10-CM | POA: Diagnosis not present

## 2015-04-08 DIAGNOSIS — W06XXXD Fall from bed, subsequent encounter: Secondary | ICD-10-CM | POA: Diagnosis not present

## 2015-04-08 DIAGNOSIS — S42213D Unspecified displaced fracture of surgical neck of unspecified humerus, subsequent encounter for fracture with routine healing: Secondary | ICD-10-CM | POA: Diagnosis not present

## 2015-04-08 DIAGNOSIS — R2689 Other abnormalities of gait and mobility: Secondary | ICD-10-CM | POA: Diagnosis not present

## 2015-04-08 NOTE — Telephone Encounter (Signed)
Pt called states while in rehab at [place], Dr Clemmie Krill prescribed her Synthroid.  Pt states she does not have a dx of thyroidism.  Pt is asking is asking if she needs to take this medication.  Please advise

## 2015-04-08 NOTE — Telephone Encounter (Signed)
Spoke with pt, advised of MDs message.  Pt states after she secures a ride to the clinic she will schedule lab appoint for TSH.  Also advised pt she is only to take combination BP med prescribed by Dr Nicki Reaper.  Pt verbalized understanding

## 2015-04-08 NOTE — Telephone Encounter (Signed)
Will need to come in for tsh check.  We also need to add the medication to her med list.  Denise Barry had sent me another phone message on this pt.  She had called asking about her blood pressure medication.  See that message and please answer both when calling her back.  Thanks.

## 2015-04-08 NOTE — Telephone Encounter (Signed)
At her last office visit, I changed her to lisinopril/hctz 10/12.5.  This should be the medication she is taking.  She was previously on hctz 25mg  q day.  (she apparently also sees Dr Otilio Miu (or has) and she has prescribed the hctz.  This may be why she received two.  Should just be taking the lisinopril/hctz 10/12.5.  Let me know if any problems.

## 2015-04-08 NOTE — Telephone Encounter (Signed)
Pt was admitted in April, 2016.  Yes she has been taking it regularly.  Please advise

## 2015-04-08 NOTE — Telephone Encounter (Signed)
Is she talking about being in rehab recently.  When did she start the medication and has she been taking it regularly since started.   Let me know.  May need tsh checked.

## 2015-04-08 NOTE — Telephone Encounter (Signed)
The pt called and stated she was given two different blood pressure medications.  She is hoping to find out which one she needs to be taking.  Thanks!

## 2015-04-09 ENCOUNTER — Other Ambulatory Visit: Payer: Self-pay | Admitting: *Deleted

## 2015-04-09 ENCOUNTER — Other Ambulatory Visit (INDEPENDENT_AMBULATORY_CARE_PROVIDER_SITE_OTHER): Payer: Medicare Other

## 2015-04-09 DIAGNOSIS — E7800 Pure hypercholesterolemia, unspecified: Secondary | ICD-10-CM

## 2015-04-09 DIAGNOSIS — I10 Essential (primary) hypertension: Secondary | ICD-10-CM

## 2015-04-09 LAB — LIPID PANEL
CHOL/HDL RATIO: 3
Cholesterol: 195 mg/dL (ref 0–200)
HDL: 67.1 mg/dL (ref >39.00)
LDL CALC: 111 mg/dL — AB (ref 0–99)
NONHDL: 127.9
TRIGLYCERIDES: 83 mg/dL (ref 0.0–149.0)
VLDL: 16.6 mg/dL (ref 0.0–40.0)

## 2015-04-09 LAB — COMPREHENSIVE METABOLIC PANEL
ALT: 10 U/L (ref 0–35)
AST: 14 U/L (ref 0–37)
Albumin: 4.3 g/dL (ref 3.5–5.2)
Alkaline Phosphatase: 101 U/L (ref 39–117)
BUN: 16 mg/dL (ref 6–23)
CALCIUM: 9.6 mg/dL (ref 8.4–10.5)
CHLORIDE: 92 meq/L — AB (ref 96–112)
CO2: 33 meq/L — AB (ref 19–32)
CREATININE: 0.8 mg/dL (ref 0.40–1.20)
GFR: 73.58 mL/min (ref >60.00)
Glucose, Bld: 84 mg/dL (ref 70–99)
POTASSIUM: 4 meq/L (ref 3.5–5.1)
Sodium: 130 mEq/L — ABNORMAL LOW (ref 135–145)
Total Bilirubin: 0.5 mg/dL (ref 0.2–1.2)
Total Protein: 6.5 g/dL (ref 6.0–8.3)

## 2015-04-09 NOTE — Telephone Encounter (Signed)
Left message for pt to return my call.

## 2015-04-10 ENCOUNTER — Other Ambulatory Visit (INDEPENDENT_AMBULATORY_CARE_PROVIDER_SITE_OTHER): Payer: Medicare Other

## 2015-04-10 ENCOUNTER — Encounter: Payer: Self-pay | Admitting: Internal Medicine

## 2015-04-10 ENCOUNTER — Other Ambulatory Visit: Payer: Self-pay | Admitting: Internal Medicine

## 2015-04-10 ENCOUNTER — Telehealth: Payer: Self-pay | Admitting: Internal Medicine

## 2015-04-10 DIAGNOSIS — R946 Abnormal results of thyroid function studies: Secondary | ICD-10-CM

## 2015-04-10 DIAGNOSIS — R7989 Other specified abnormal findings of blood chemistry: Secondary | ICD-10-CM

## 2015-04-10 LAB — TSH: TSH: 1.85 u[IU]/mL (ref 0.35–4.50)

## 2015-04-10 NOTE — Telephone Encounter (Signed)
See lab note.  

## 2015-04-10 NOTE — Telephone Encounter (Signed)
See other encounters, pt was informed.

## 2015-04-10 NOTE — Progress Notes (Signed)
Order placed for tsh 

## 2015-04-10 NOTE — Telephone Encounter (Signed)
Pt called to get lab results/msn

## 2015-04-11 ENCOUNTER — Other Ambulatory Visit: Payer: Self-pay | Admitting: Internal Medicine

## 2015-04-11 ENCOUNTER — Encounter: Payer: Self-pay | Admitting: Internal Medicine

## 2015-04-11 DIAGNOSIS — E871 Hypo-osmolality and hyponatremia: Secondary | ICD-10-CM

## 2015-04-11 NOTE — Progress Notes (Signed)
Order placed for f/u sodium.  ?

## 2015-04-13 DIAGNOSIS — R2689 Other abnormalities of gait and mobility: Secondary | ICD-10-CM | POA: Diagnosis not present

## 2015-04-13 DIAGNOSIS — I10 Essential (primary) hypertension: Secondary | ICD-10-CM | POA: Diagnosis not present

## 2015-04-13 DIAGNOSIS — S42213D Unspecified displaced fracture of surgical neck of unspecified humerus, subsequent encounter for fracture with routine healing: Secondary | ICD-10-CM | POA: Diagnosis not present

## 2015-04-13 DIAGNOSIS — W06XXXD Fall from bed, subsequent encounter: Secondary | ICD-10-CM | POA: Diagnosis not present

## 2015-04-15 DIAGNOSIS — S42213D Unspecified displaced fracture of surgical neck of unspecified humerus, subsequent encounter for fracture with routine healing: Secondary | ICD-10-CM | POA: Diagnosis not present

## 2015-04-15 DIAGNOSIS — I10 Essential (primary) hypertension: Secondary | ICD-10-CM | POA: Diagnosis not present

## 2015-04-15 DIAGNOSIS — R2689 Other abnormalities of gait and mobility: Secondary | ICD-10-CM | POA: Diagnosis not present

## 2015-04-15 DIAGNOSIS — W06XXXD Fall from bed, subsequent encounter: Secondary | ICD-10-CM | POA: Diagnosis not present

## 2015-04-16 ENCOUNTER — Other Ambulatory Visit (INDEPENDENT_AMBULATORY_CARE_PROVIDER_SITE_OTHER): Payer: Medicare Other

## 2015-04-16 DIAGNOSIS — E871 Hypo-osmolality and hyponatremia: Secondary | ICD-10-CM

## 2015-04-16 LAB — SODIUM: Sodium: 128 mEq/L — ABNORMAL LOW (ref 135–145)

## 2015-04-17 DIAGNOSIS — W06XXXD Fall from bed, subsequent encounter: Secondary | ICD-10-CM | POA: Diagnosis not present

## 2015-04-17 DIAGNOSIS — S42213D Unspecified displaced fracture of surgical neck of unspecified humerus, subsequent encounter for fracture with routine healing: Secondary | ICD-10-CM | POA: Diagnosis not present

## 2015-04-18 ENCOUNTER — Ambulatory Visit
Admission: EM | Admit: 2015-04-18 | Discharge: 2015-04-18 | Disposition: A | Discharge status: Home / Self Care | Payer: Medicare Other | Attending: Internal Medicine | Admitting: Internal Medicine

## 2015-04-18 DIAGNOSIS — E780 Pure hypercholesterolemia: Secondary | ICD-10-CM | POA: Diagnosis not present

## 2015-04-18 DIAGNOSIS — M1990 Unspecified osteoarthritis, unspecified site: Secondary | ICD-10-CM | POA: Diagnosis not present

## 2015-04-18 DIAGNOSIS — R32 Unspecified urinary incontinence: Secondary | ICD-10-CM | POA: Diagnosis not present

## 2015-04-18 DIAGNOSIS — I10 Essential (primary) hypertension: Secondary | ICD-10-CM | POA: Insufficient documentation

## 2015-04-18 DIAGNOSIS — Z87891 Personal history of nicotine dependence: Secondary | ICD-10-CM | POA: Insufficient documentation

## 2015-04-18 DIAGNOSIS — I8390 Asymptomatic varicose veins of unspecified lower extremity: Secondary | ICD-10-CM | POA: Insufficient documentation

## 2015-04-18 DIAGNOSIS — E871 Hypo-osmolality and hyponatremia: Secondary | ICD-10-CM | POA: Insufficient documentation

## 2015-04-18 LAB — BASIC METABOLIC PANEL
ANION GAP: 5 (ref 5–15)
BUN: 17 mg/dL (ref 6–20)
CO2: 29 mmol/L (ref 22–32)
Calcium: 9.4 mg/dL (ref 8.9–10.3)
Chloride: 97 mmol/L — ABNORMAL LOW (ref 101–111)
Creatinine, Ser: 0.74 mg/dL (ref 0.44–1.00)
GFR calc Af Amer: >60 mL/min (ref >60)
GFR calc non Af Amer: >60 mL/min (ref >60)
Glucose, Bld: 108 mg/dL — ABNORMAL HIGH (ref 65–99)
POTASSIUM: 4.2 mmol/L (ref 3.5–5.1)
SODIUM: 131 mmol/L — AB (ref 135–145)

## 2015-04-18 NOTE — ED Provider Notes (Signed)
CSN: [id number]     Arrival date & time 04/18/15  1118 History   First MD Initiated Contact with Patient 04/18/15 1146     Chief Complaint  Patient presents with  . Hypertension  hyponatremia  HPI   81 yo lady with PMHx hypertension, trigeminal neuralgia.  Lab work in last couple days demonstrated mild hyponatremia, asymptomatic, Na 128.  Patient's bp med (lisinopril/HCTZ) was stopped. Here to have Na, bp, checked.  Having difficulty with increased R facial pain c/w trigeminal neuralgia for several months, followed by [facility] neurologist.  No chest pain, no dyspnea, no balance issues.  No worsening of R facial pain or new headache.  Past Medical History  Diagnosis Date  . Osteoarthritis   . Varicose veins     H/O  . Hypercholesterolemia   . Hypertension   . Urinary incontinence     pessary in place   Past Surgical History  Procedure Laterality Date  . Back surgery  09/16/13  . Sclerosis  2000  . Dilation and curettage of uterus  1971  . Tubal ligation  1972   Family History  Problem Relation Age of Onset  . Heart disease Mother   . Diabetes Mother   . Hypertension Mother   . Heart disease Father   . Stroke Father    History  Substance Use Topics  . Smoking status: Former Research scientist (life sciences)  . Smokeless tobacco: Never Used  . Alcohol Use: No   OB History    No data available     Review of Systems  All other systems reviewed and are negative.   Allergies  Codeine  Home Medications taking trileptal tid for several ?months, for trigeminal neuralgia sx's.   Prior to Admission medications   Medication Sig Start Date End Date Taking? Authorizing Provider  Acetaminophen (TYLENOL EXTRA STRENGTH PO) Take 1-2 tablets by mouth daily.   Yes Historical Provider, MD  aspirin 81 MG tablet Take 81 mg by mouth daily.   Yes Historical Provider, MD  Bisacodyl (DULCOLAX PO) Take by mouth daily.   Yes Historical Provider, MD  fexofenadine (ALLEGRA) 180 MG tablet Take 180 mg by mouth as needed  for allergies or rhinitis.   Yes Historical Provider, MD  IRON PO Take by mouth daily.   Yes Historical Provider, MD  Multiple Vitamins-Minerals (CENTRUM SILVER ULTRA WOMENS PO) Take by mouth.   Yes Historical Provider, MD  Oxcarbazepine (TRILEPTAL) 300 MG tablet Take 300 mg by mouth 2 (two) times daily.   Yes Malon Kindle, MD  VITAMIN D, CHOLECALCIFEROL, PO Take by mouth daily.   Yes Historical Provider, MD                 BP 153/69 mmHg  Pulse 77  Temp(Src) 97.8 F (36.6 C) (Oral)  Resp 16  Ht 5\' 5"  (1.651 m)  Wt 159 lb (72.122 kg)  BMI 26.46 kg/m2  SpO2 97% Physical Exam  Constitutional: She is oriented to person, place, and time. No distress.  Alert, nicely groomed  HENT:  Head: Atraumatic.  Eyes:  Conjugate gaze, no eye redness/drainage  Neck: Neck supple.  Cardiovascular: Normal rate and regular rhythm.   Pulmonary/Chest: No respiratory distress.  Lungs clear, symmetric breath sounds  Abdominal: She exhibits no distension.  Musculoskeletal: Normal range of motion.  No leg swelling  Neurological: She is alert and oriented to person, place, and time.  Skin: Skin is warm and dry.  No cyanosis  Nursing note and vitals reviewed.  ED Course  Procedures (including critical care time) Labs Review Labs Reviewed  BASIC METABOLIC PANEL - Abnormal; Notable for the following:    Sodium 131 (*)    Chloride 97 (*)    Glucose, Bld 108 (*)    All other components within normal limits        MDM   1. Essential hypertension  Slightly elevated bp off meds.  Will observe for now.  Pt to followup with pcp/Dr Nicki Reaper next week and will discuss med tx.  Pt did not wish to start a new/different agent today.   2.  Recheck mild hyponatremia (improved).    Sherlene Shams, MD 04/18/15 [phone]

## 2015-04-18 NOTE — Discharge Instructions (Signed)
Continue off of HCTZ/lisinopril until followup with Dr Nicki Reaper.  Sodium level today was 131 (which is fine).

## 2015-04-18 NOTE — ED Notes (Signed)
Writer spoke to Dr. Nicki Reaper yesterday. She left a message for today's provider regarding need for patient to have Na and BP check. Pt's Na+ was 128 on 6/17. Pt has clear mentation. Dr. Nicki Reaper is contributing patient's low Na+ to blood pressure medication. Dr. Nicki Reaper d/c'd patient's bp med and if one is needed she suggest pt be started one that is gentle on Na+ depletion. She suggests that if patient's Na+ is lower she should be sent to ED for further workup and possible admission to correct Na+. Discussed with Dr. Valere Dross.

## 2015-04-18 NOTE — ED Notes (Signed)
See writer's previous note. Pt here for Na+ and BP follow up at Dr. Bary Leriche (pt's PCP) request.

## 2015-04-20 ENCOUNTER — Telehealth: Payer: Self-pay | Admitting: *Deleted

## 2015-04-20 ENCOUNTER — Other Ambulatory Visit: Payer: Self-pay | Admitting: Family Medicine

## 2015-04-20 DIAGNOSIS — W06XXXD Fall from bed, subsequent encounter: Secondary | ICD-10-CM | POA: Diagnosis not present

## 2015-04-20 DIAGNOSIS — I10 Essential (primary) hypertension: Secondary | ICD-10-CM | POA: Diagnosis not present

## 2015-04-20 DIAGNOSIS — R2689 Other abnormalities of gait and mobility: Secondary | ICD-10-CM | POA: Diagnosis not present

## 2015-04-20 DIAGNOSIS — S42213D Unspecified displaced fracture of surgical neck of unspecified humerus, subsequent encounter for fracture with routine healing: Secondary | ICD-10-CM | POA: Diagnosis not present

## 2015-04-20 NOTE — Telephone Encounter (Signed)
Pt notified and  verbalized understanding. Will check BP this week and call with readings.

## 2015-04-20 NOTE — Telephone Encounter (Signed)
Sodium better this weekend.  She was instructed to follow up regarding her blood pressure.  If no openings this week, then can see how her blood pressures are doing.  Check and record.  If elevated, may need to start a new blood pressure medication - like amlodipine or may start her on lisinopril instead of the combination lisinopril/hctz that she was taking previously - then can f/u with her next week.  If she has not been checking, have her check over the next couple of days and let us know how doing, then will adjust/add medications if needed.

## 2015-04-20 NOTE — Telephone Encounter (Signed)
Pt called wanting to know what she is to be doing about her BP medications. Was seen in UC over the weekend for visit and labs.

## 2015-04-22 DIAGNOSIS — S42213D Unspecified displaced fracture of surgical neck of unspecified humerus, subsequent encounter for fracture with routine healing: Secondary | ICD-10-CM | POA: Diagnosis not present

## 2015-04-22 DIAGNOSIS — W06XXXD Fall from bed, subsequent encounter: Secondary | ICD-10-CM | POA: Diagnosis not present

## 2015-04-22 DIAGNOSIS — R2689 Other abnormalities of gait and mobility: Secondary | ICD-10-CM | POA: Diagnosis not present

## 2015-04-22 DIAGNOSIS — I10 Essential (primary) hypertension: Secondary | ICD-10-CM | POA: Diagnosis not present

## 2015-04-24 ENCOUNTER — Telehealth: Payer: Self-pay | Admitting: *Deleted

## 2015-04-24 DIAGNOSIS — R2689 Other abnormalities of gait and mobility: Secondary | ICD-10-CM | POA: Diagnosis not present

## 2015-04-24 DIAGNOSIS — I10 Essential (primary) hypertension: Secondary | ICD-10-CM | POA: Diagnosis not present

## 2015-04-24 DIAGNOSIS — S42213D Unspecified displaced fracture of surgical neck of unspecified humerus, subsequent encounter for fracture with routine healing: Secondary | ICD-10-CM | POA: Diagnosis not present

## 2015-04-24 DIAGNOSIS — W06XXXD Fall from bed, subsequent encounter: Secondary | ICD-10-CM | POA: Diagnosis not present

## 2015-04-24 NOTE — Telephone Encounter (Signed)
Overall blood pressures are ok.  Continue off medication.  Continue to monitor blood pressure and call in next week.

## 2015-04-24 NOTE — Telephone Encounter (Signed)
Pt notified and verbalized understanding.

## 2015-04-24 NOTE — Telephone Encounter (Signed)
Pt called to report BPs, has been off BP medication all week. Readings daily starting 04/20/15: 128/67, 142/69, 134/68, 156/83, recheck later that day was 142/71, 129/76.

## 2015-04-27 ENCOUNTER — Other Ambulatory Visit: Payer: Self-pay

## 2015-04-27 DIAGNOSIS — S42213D Unspecified displaced fracture of surgical neck of unspecified humerus, subsequent encounter for fracture with routine healing: Secondary | ICD-10-CM | POA: Diagnosis not present

## 2015-04-27 DIAGNOSIS — I10 Essential (primary) hypertension: Secondary | ICD-10-CM | POA: Diagnosis not present

## 2015-04-27 DIAGNOSIS — W06XXXD Fall from bed, subsequent encounter: Secondary | ICD-10-CM | POA: Diagnosis not present

## 2015-04-27 DIAGNOSIS — R2689 Other abnormalities of gait and mobility: Secondary | ICD-10-CM | POA: Diagnosis not present

## 2015-04-28 ENCOUNTER — Telehealth: Payer: Self-pay | Admitting: *Deleted

## 2015-04-28 MED ORDER — LISINOPRIL 10 MG PO TABS: 10.0000 mg | ORAL_TABLET | Freq: Every day | ORAL | Status: DC

## 2015-04-28 NOTE — Telephone Encounter (Signed)
Pt notified and  verbalized understanding. Unable to be seen on 05/01/15, scheduled appt 05/07/15. Rx sent to pharmacy by escript. Advised pt to check BPs after starting Lisinopril and to call back with readings.

## 2015-04-28 NOTE — Telephone Encounter (Signed)
Left message for pt to return my call.

## 2015-04-28 NOTE — Telephone Encounter (Signed)
I would like for her to start just lisinopril 10mg  q day.  (not the combined lisinopril/hctz).  Will need to call in rx.  Also schedule her to see me - 05/01/15 - 12:00- 12:15.  Thanks

## 2015-04-28 NOTE — Telephone Encounter (Signed)
Pt called with recent BPs:   6/24    163/80 6/25     163/74 6/26     150/79, 153/81 6/27    168/85 6/28     163/78 Still off all BP meds. Denies symptoms

## 2015-05-01 DIAGNOSIS — S42213D Unspecified displaced fracture of surgical neck of unspecified humerus, subsequent encounter for fracture with routine healing: Secondary | ICD-10-CM | POA: Diagnosis not present

## 2015-05-01 DIAGNOSIS — W06XXXD Fall from bed, subsequent encounter: Secondary | ICD-10-CM | POA: Diagnosis not present

## 2015-05-01 DIAGNOSIS — I10 Essential (primary) hypertension: Secondary | ICD-10-CM | POA: Diagnosis not present

## 2015-05-01 DIAGNOSIS — R2689 Other abnormalities of gait and mobility: Secondary | ICD-10-CM | POA: Diagnosis not present

## 2015-05-04 DIAGNOSIS — W06XXXD Fall from bed, subsequent encounter: Secondary | ICD-10-CM | POA: Diagnosis not present

## 2015-05-04 DIAGNOSIS — I10 Essential (primary) hypertension: Secondary | ICD-10-CM | POA: Diagnosis not present

## 2015-05-04 DIAGNOSIS — S42213D Unspecified displaced fracture of surgical neck of unspecified humerus, subsequent encounter for fracture with routine healing: Secondary | ICD-10-CM | POA: Diagnosis not present

## 2015-05-04 DIAGNOSIS — R2689 Other abnormalities of gait and mobility: Secondary | ICD-10-CM | POA: Diagnosis not present

## 2015-05-06 DIAGNOSIS — W06XXXD Fall from bed, subsequent encounter: Secondary | ICD-10-CM | POA: Diagnosis not present

## 2015-05-06 DIAGNOSIS — R2689 Other abnormalities of gait and mobility: Secondary | ICD-10-CM | POA: Diagnosis not present

## 2015-05-06 DIAGNOSIS — I10 Essential (primary) hypertension: Secondary | ICD-10-CM | POA: Diagnosis not present

## 2015-05-06 DIAGNOSIS — S42213D Unspecified displaced fracture of surgical neck of unspecified humerus, subsequent encounter for fracture with routine healing: Secondary | ICD-10-CM | POA: Diagnosis not present

## 2015-05-07 ENCOUNTER — Encounter: Payer: Self-pay | Admitting: Internal Medicine

## 2015-05-07 ENCOUNTER — Ambulatory Visit (INDEPENDENT_AMBULATORY_CARE_PROVIDER_SITE_OTHER): Payer: Medicare Other | Admitting: Internal Medicine

## 2015-05-07 VITALS — BP 148/70 | HR 71 | Temp 97.6°F | Ht 64.5 in | Wt 160.5 lb

## 2015-05-07 DIAGNOSIS — S72002D Fracture of unspecified part of neck of left femur, subsequent encounter for closed fracture with routine healing: Secondary | ICD-10-CM | POA: Diagnosis not present

## 2015-05-07 DIAGNOSIS — I10 Essential (primary) hypertension: Secondary | ICD-10-CM

## 2015-05-07 DIAGNOSIS — M7989 Other specified soft tissue disorders: Secondary | ICD-10-CM

## 2015-05-07 DIAGNOSIS — E7800 Pure hypercholesterolemia, unspecified: Secondary | ICD-10-CM

## 2015-05-07 DIAGNOSIS — E871 Hypo-osmolality and hyponatremia: Secondary | ICD-10-CM | POA: Diagnosis not present

## 2015-05-07 DIAGNOSIS — Z0000 Encounter for general adult medical examination without abnormal findings: Secondary | ICD-10-CM

## 2015-05-07 DIAGNOSIS — D72829 Elevated white blood cell count, unspecified: Secondary | ICD-10-CM | POA: Diagnosis not present

## 2015-05-07 LAB — BASIC METABOLIC PANEL
BUN: 20 mg/dL (ref 6–23)
CHLORIDE: 104 meq/L (ref 96–112)
CO2: 29 mEq/L (ref 19–32)
Calcium: 9.5 mg/dL (ref 8.4–10.5)
Creatinine, Ser: 0.85 mg/dL (ref 0.40–1.20)
GFR: 68.59 mL/min (ref >60.00)
Glucose, Bld: 78 mg/dL (ref 70–99)
POTASSIUM: 4.3 meq/L (ref 3.5–5.1)
Sodium: 140 mEq/L (ref 135–145)

## 2015-05-07 LAB — CBC WITH DIFFERENTIAL/PLATELET
BASOS ABS: 0 10*3/uL (ref 0.0–0.1)
BASOS PCT: 0.5 % (ref 0.0–3.0)
EOS ABS: 0.2 10*3/uL (ref 0.0–0.7)
Eosinophils Relative: 2.9 % (ref 0.0–5.0)
HEMATOCRIT: 39.3 % (ref 36.0–46.0)
Hemoglobin: 13.2 g/dL (ref 12.0–15.0)
Lymphocytes Relative: 21.2 % (ref 12.0–46.0)
Lymphs Abs: 1.4 10*3/uL (ref 0.7–4.0)
MCHC: 33.7 g/dL (ref 30.0–36.0)
MCV: 94.5 fl (ref 78.0–100.0)
MONO ABS: 0.7 10*3/uL (ref 0.1–1.0)
Monocytes Relative: 10.3 % (ref 3.0–12.0)
NEUTROS ABS: 4.2 10*3/uL (ref 1.4–7.7)
Neutrophils Relative %: 65.1 % (ref 43.0–77.0)
PLATELETS: 253 10*3/uL (ref 150.0–400.0)
RBC: 4.17 Mil/uL (ref 3.87–5.11)
RDW: 14.4 % (ref 11.5–15.5)
WBC: 6.4 10*3/uL (ref 4.0–10.5)

## 2015-05-07 MED ORDER — LISINOPRIL 20 MG PO TABS: 20.0000 mg | ORAL_TABLET | Freq: Every day | ORAL | Status: DC

## 2015-05-07 NOTE — Progress Notes (Signed)
Patient ID: Brynnly Bonet, female   DOB: 1934/08/24, 81 y.o.   MRN: [id number]   Subjective:    Patient ID: [name], female    DOB: 07/15/1934, 81 y.o.   MRN: [id number]  HPI  Patient here for a scheduled follow up.  Here to follow up on her blood pressure.  On lisinopril now.  Was having problems with low sodium.  Diuretic was stopped.  Was off blood pressure medication for a while.  Blood pressure started increasing and she was started back on lisinopril 10mg  q day.  Brought in outside blood pressure readings.  Most averaging 140s/70s.  Still occasionally increasing.  Energy improving.  Working with PT and OT.  OT released her yesterday.  Due to see her surgeon tomorrow.  No cardiac symptoms with increased activity or exertion.  No sob.     Past Medical History  Diagnosis Date  . Osteoarthritis   . Varicose veins     H/O  . Hypercholesterolemia   . Hypertension   . Urinary incontinence     pessary in place    Current Outpatient Prescriptions on File Prior to Visit  Medication Sig Dispense Refill  . Acetaminophen (TYLENOL EXTRA STRENGTH PO) Take 1-2 tablets by mouth daily.    Marland Kitchen aspirin 81 MG tablet Take 81 mg by mouth daily.    . Bisacodyl (DULCOLAX PO) Take by mouth daily.    . fexofenadine (ALLEGRA) 180 MG tablet Take 180 mg by mouth as needed for allergies or rhinitis.    . IRON PO Take by mouth daily.    . Multiple Vitamins-Minerals (CENTRUM SILVER ULTRA WOMENS PO) Take by mouth.    . Oxcarbazepine (TRILEPTAL) 300 MG tablet Take 300 mg by mouth 2 (two) times daily.    Marland Kitchen VITAMIN D, CHOLECALCIFEROL, PO Take by mouth daily.     No current facility-administered medications on file prior to visit.    Review of Systems  Constitutional: Negative for appetite change and unexpected weight change.  HENT: Negative for congestion and sinus pressure.   Respiratory: Negative for cough, chest tightness and shortness of breath.   Cardiovascular: Negative for chest pain  and palpitations.       Wearing compression hose.  Swelling better.   Gastrointestinal: Negative for nausea, vomiting, abdominal pain and diarrhea.  Genitourinary: Negative for dysuria and difficulty urinating.  Neurological: Negative for dizziness, light-headedness and headaches.  Psychiatric/Behavioral: Negative for dysphoric mood and agitation.       Objective:    Physical Exam  Constitutional: She appears well-developed and well-nourished. No distress.  HENT:  Nose: Nose normal.  Mouth/Throat: Oropharynx is clear and moist.  Neck: Neck supple. No thyromegaly present.  Cardiovascular: Normal rate and regular rhythm.   Pulmonary/Chest: Breath sounds normal. No respiratory distress. She has no wheezes.  Abdominal: Soft. Bowel sounds are normal. There is no tenderness.  Musculoskeletal: She exhibits no edema or tenderness.  Lymphadenopathy:    She has no cervical adenopathy.  Skin: No rash noted. No erythema.  Psychiatric: She has a normal mood and affect. Her behavior is normal.    BP 148/70 mmHg  Pulse 71  Temp(Src) 97.6 F (36.4 C) (Oral)  Ht 5' 4.5" (1.638 m)  Wt 160 lb 8 oz (72.802 kg)  BMI 27.13 kg/m2  SpO2 96% Wt Readings from Last 3 Encounters:  05/07/15 160 lb 8 oz (72.802 kg)  04/18/15 159 lb (72.122 kg)  01/14/15 163 lb 4 oz (74.05 kg)  Lab Results  Component Value Date   WBC 6.4 05/07/2015   HGB 13.2 05/07/2015   HCT 39.3 05/07/2015   PLT 253.0 05/07/2015   GLUCOSE 78 05/07/2015   CHOL 195 04/09/2015   TRIG 83.0 04/09/2015   HDL 67.10 04/09/2015   LDLCALC 111* 04/09/2015   ALT 10 04/09/2015   AST 14 04/09/2015   NA 140 05/07/2015   K 4.3 05/07/2015   CL 104 05/07/2015   CREATININE 0.85 05/07/2015   BUN 20 05/07/2015   CO2 29 05/07/2015   TSH 1.85 04/10/2015   INR 1.0 02/03/2015       Assessment & Plan:   Problem List Items Addressed This Visit    Essential hypertension, benign - Primary    On lisinopril 10mg  q day now.  Check  metabolic panel.  Given bp still elevated, will increase lisinopril to 20mg  q day.  Follow.  Pressures.        Relevant Medications   lisinopril (PRINIVIL,ZESTRIL) 20 MG tablet   Other Relevant Orders   Basic metabolic panel (Completed)   Health care maintenance    Physical 07/09/14.  Mammogram 12/17/14 - Birads I.        Hip fracture, left    S/p hip fracture and surgery.  Getting around better.  Energy improving.        Hypercholesterolemia    Low cholesterol diet and exercise.  Follow lipid panel.        Relevant Medications   lisinopril (PRINIVIL,ZESTRIL) 20 MG tablet   Hyponatremia    Recently found to have low sodium.  Improved last check.  Off diuretic.  Recheck today.       Relevant Orders   Basic metabolic panel (Completed)   Right leg swelling    Has been followed by [facility].  Wearing compression hose.         Other Visit Diagnoses    Leukocytosis        Relevant Orders    CBC with Differential/Platelet (Completed)        Einar Pheasant, MD

## 2015-05-07 NOTE — Progress Notes (Signed)
Pre visit review using our clinic review tool, if applicable. No additional management support is needed unless otherwise documented below in the visit note. 

## 2015-05-07 NOTE — Patient Instructions (Signed)
Increase your lisinopril to 20mg  per day.  You now have 10mg  tablets.  You can take two of these per day until you run out and then get the prescription for the 20mg  tablets.

## 2015-05-08 DIAGNOSIS — S42222A 2-part displaced fracture of surgical neck of left humerus, initial encounter for closed fracture: Secondary | ICD-10-CM | POA: Diagnosis not present

## 2015-05-10 ENCOUNTER — Encounter: Payer: Self-pay | Admitting: Internal Medicine

## 2015-05-10 NOTE — Assessment & Plan Note (Signed)
Physical 07/09/14.  Mammogram 12/17/14 - Birads I.

## 2015-05-10 NOTE — Assessment & Plan Note (Signed)
Low cholesterol diet and exercise.  Follow lipid panel.   

## 2015-05-10 NOTE — Assessment & Plan Note (Signed)
Has been followed by [facility].  Wearing compression hose.

## 2015-05-10 NOTE — Assessment & Plan Note (Signed)
On lisinopril 10mg  q day now.  Check metabolic panel.  Given bp still elevated, will increase lisinopril to 20mg  q day.  Follow.  Pressures.

## 2015-05-10 NOTE — Assessment & Plan Note (Signed)
S/p hip fracture and surgery.  Getting around better.  Energy improving.

## 2015-05-10 NOTE — Assessment & Plan Note (Signed)
Recently found to have low sodium.  Improved last check.  Off diuretic.  Recheck today.

## 2015-05-11 NOTE — Telephone Encounter (Signed)
Unread mychart message mailed to patient 

## 2015-05-13 DIAGNOSIS — H5711 Ocular pain, right eye: Secondary | ICD-10-CM | POA: Diagnosis not present

## 2015-05-25 DIAGNOSIS — G500 Trigeminal neuralgia: Secondary | ICD-10-CM | POA: Diagnosis not present

## 2015-06-01 ENCOUNTER — Encounter: Payer: Self-pay | Admitting: Internal Medicine

## 2015-06-01 ENCOUNTER — Ambulatory Visit (INDEPENDENT_AMBULATORY_CARE_PROVIDER_SITE_OTHER): Payer: Medicare Other | Admitting: Internal Medicine

## 2015-06-01 VITALS — BP 130/70 | HR 60 | Temp 98.0°F | Ht 64.5 in | Wt 160.4 lb

## 2015-06-01 DIAGNOSIS — I10 Essential (primary) hypertension: Secondary | ICD-10-CM | POA: Diagnosis not present

## 2015-06-01 DIAGNOSIS — G500 Trigeminal neuralgia: Secondary | ICD-10-CM | POA: Diagnosis not present

## 2015-06-01 DIAGNOSIS — E871 Hypo-osmolality and hyponatremia: Secondary | ICD-10-CM | POA: Diagnosis not present

## 2015-06-01 DIAGNOSIS — Z8781 Personal history of (healed) traumatic fracture: Secondary | ICD-10-CM | POA: Diagnosis not present

## 2015-06-01 DIAGNOSIS — M25612 Stiffness of left shoulder, not elsewhere classified: Secondary | ICD-10-CM | POA: Diagnosis not present

## 2015-06-01 DIAGNOSIS — M25512 Pain in left shoulder: Secondary | ICD-10-CM | POA: Diagnosis not present

## 2015-06-01 NOTE — Progress Notes (Signed)
Patient ID: Denise Barry, female   DOB: [birth year], 81 y.o.   MRN: [id number]   Subjective:    Patient ID: Denise Barry, female    DOB: 1934/05/14, 81 y.o.   MRN: [id number]  HPI  Patient here for a scheduled follow up.  Here to follow up on her blood pressure.  Her recent readings are averaging 120-140s/60-70.  Increased lisinopril to 68m q day last visit.  Tolerating.  Recent shoulder fracture.  Seeing ortho.  Healing well.  On lyrica now.  Just started 10 days ago.  Does feel helping her head.  Due to see neurosurgery in September to try a laser treatment.  Energy level is better.  Tries to stay active.  No cardiac symptoms with increased activity or exertion.  No sob.     Past Medical History  Diagnosis Date  . Osteoarthritis   . Varicose veins     H/O  . Hypercholesterolemia   . Hypertension   . Urinary incontinence     pessary in place    Outpatient Encounter Prescriptions as of 06/01/2015  Medication Sig  . Acetaminophen (TYLENOL EXTRA STRENGTH PO) Take 1-2 tablets by mouth daily.  .Marland Kitchenaspirin 81 MG tablet Take 81 mg by mouth daily.  . Bisacodyl (DULCOLAX PO) Take by mouth daily.  . fexofenadine (ALLEGRA) 180 MG tablet Take 180 mg by mouth as needed for allergies or rhinitis.  . IRON PO Take by mouth daily.  .Marland Kitchenlisinopril (PRINIVIL,ZESTRIL) 20 MG tablet Take 1 tablet (20 mg total) by mouth daily.  . Multiple Vitamins-Minerals (CENTRUM SILVER ULTRA WOMENS PO) Take by mouth.  . pregabalin (LYRICA) 50 MG capsule Take 50 mg by mouth 3 (three) times daily.  .Marland KitchenVITAMIN D, CHOLECALCIFEROL, PO Take by mouth daily.  . [DISCONTINUED] Oxcarbazepine (TRILEPTAL) 300 MG tablet Take 300 mg by mouth 2 (two) times daily.   No facility-administered encounter medications on file as of 06/01/2015.    Review of Systems  Constitutional: Negative for appetite change and unexpected weight change.  HENT: Negative for congestion and sinus pressure.   Respiratory: Negative for cough,  chest tightness and shortness of breath.   Cardiovascular: Negative for chest pain, palpitations and leg swelling.  Gastrointestinal: Negative for nausea, vomiting, abdominal pain and diarrhea.  Skin: Negative for color change and rash.  Neurological: Negative for dizziness, light-headedness and headaches.  Psychiatric/Behavioral: Negative for dysphoric mood and agitation.       Objective:     Blood pressure recheck:  136/78  Physical Exam  Constitutional: She appears well-developed and well-nourished. No distress.  HENT:  Nose: Nose normal.  Mouth/Throat: Oropharynx is clear and moist.  Neck: Neck supple. No thyromegaly present.  Cardiovascular: Normal rate and regular rhythm.   Pulmonary/Chest: Breath sounds normal. No respiratory distress. She has no wheezes.  Abdominal: Soft. Bowel sounds are normal. There is no tenderness.  Musculoskeletal: She exhibits no edema or tenderness.  Lymphadenopathy:    She has no cervical adenopathy.  Skin: No rash noted. No erythema.  Psychiatric: She has a normal mood and affect. Her behavior is normal.    BP 130/70 mmHg  Pulse 60  Temp(Src) 98 F (36.7 C) (Oral)  Ht 5' 4.5" (1.638 m)  Wt 160 lb 6 oz (72.746 kg)  BMI 27.11 kg/m2  SpO2 96% Wt Readings from Last 3 Encounters:  06/01/15 160 lb 6 oz (72.746 kg)  05/07/15 160 lb 8 oz (72.802 kg)  04/18/15 159 lb (72.122 kg)  Lab Results  Component Value Date   WBC 6.4 05/07/2015   HGB 13.2 05/07/2015   HCT 39.3 05/07/2015   PLT 253.0 05/07/2015   GLUCOSE 87 06/01/2015   CHOL 195 04/09/2015   TRIG 83.0 04/09/2015   HDL 67.10 04/09/2015   LDLCALC 111* 04/09/2015   ALT 10 04/09/2015   AST 14 04/09/2015   NA 142 06/01/2015   K 4.2 06/01/2015   CL 105 06/01/2015   CREATININE 0.85 06/01/2015   BUN 20 06/01/2015   CO2 31 06/01/2015   TSH 1.85 04/10/2015   INR 1.0 02/03/2015       Assessment & Plan:   Problem List Items Addressed This Visit    Essential hypertension,  benign - Primary    Blood pressure doing better.  On lisinopril 63m q day.  Recheck met b.  Follow pressures.        Relevant Orders   Basic metabolic panel (Completed)   History of left shoulder fracture    Seeing ortho.  Healing well.       Hyponatremia    Sodium on last check wnl.  Recheck today to confirm stable/normal.  Off hctz.       Trigeminal neuralgia    Due to see [facility]U in September.  Planning laser treatment.  Now on lyrica.        Relevant Medications   pregabalin (LYRICA) 50 MG capsule       SEinar Pheasant MD

## 2015-06-01 NOTE — Progress Notes (Signed)
Pre visit review using our clinic review tool, if applicable. No additional management support is needed unless otherwise documented below in the visit note. 

## 2015-06-02 DIAGNOSIS — M25512 Pain in left shoulder: Secondary | ICD-10-CM | POA: Diagnosis not present

## 2015-06-02 DIAGNOSIS — M25612 Stiffness of left shoulder, not elsewhere classified: Secondary | ICD-10-CM | POA: Diagnosis not present

## 2015-06-02 LAB — BASIC METABOLIC PANEL
BUN: 20 mg/dL (ref 6–23)
CALCIUM: 9.1 mg/dL (ref 8.4–10.5)
CO2: 31 mEq/L (ref 19–32)
Chloride: 105 mEq/L (ref 96–112)
Creatinine, Ser: 0.85 mg/dL (ref 0.40–1.20)
GFR: 68.58 mL/min (ref >60.00)
Glucose, Bld: 87 mg/dL (ref 70–99)
POTASSIUM: 4.2 meq/L (ref 3.5–5.1)
SODIUM: 142 meq/L (ref 135–145)

## 2015-06-03 ENCOUNTER — Encounter: Payer: Self-pay | Admitting: Internal Medicine

## 2015-06-03 DIAGNOSIS — Z8781 Personal history of (healed) traumatic fracture: Secondary | ICD-10-CM | POA: Insufficient documentation

## 2015-06-03 NOTE — Assessment & Plan Note (Signed)
Due to see NSU in September.  Planning laser treatment.  Now on lyrica.

## 2015-06-03 NOTE — Assessment & Plan Note (Signed)
Blood pressure doing better.  On lisinopril 8m q day.  Recheck met b.  Follow pressures.

## 2015-06-03 NOTE — Assessment & Plan Note (Signed)
Sodium on last check wnl.  Recheck today to confirm stable/normal.  Off hctz.

## 2015-06-03 NOTE — Assessment & Plan Note (Signed)
Seeing ortho.  Healing well.

## 2015-06-05 DIAGNOSIS — M25612 Stiffness of left shoulder, not elsewhere classified: Secondary | ICD-10-CM | POA: Diagnosis not present

## 2015-06-05 DIAGNOSIS — M25512 Pain in left shoulder: Secondary | ICD-10-CM | POA: Diagnosis not present

## 2015-06-05 NOTE — Telephone Encounter (Signed)
Unread mychart message mailed to patient 

## 2015-06-09 DIAGNOSIS — M25512 Pain in left shoulder: Secondary | ICD-10-CM | POA: Diagnosis not present

## 2015-06-09 DIAGNOSIS — M25612 Stiffness of left shoulder, not elsewhere classified: Secondary | ICD-10-CM | POA: Diagnosis not present

## 2015-06-11 DIAGNOSIS — M25612 Stiffness of left shoulder, not elsewhere classified: Secondary | ICD-10-CM | POA: Diagnosis not present

## 2015-06-11 DIAGNOSIS — M25512 Pain in left shoulder: Secondary | ICD-10-CM | POA: Diagnosis not present

## 2015-06-15 DIAGNOSIS — M25612 Stiffness of left shoulder, not elsewhere classified: Secondary | ICD-10-CM | POA: Diagnosis not present

## 2015-06-15 DIAGNOSIS — M25512 Pain in left shoulder: Secondary | ICD-10-CM | POA: Diagnosis not present

## 2015-06-17 ENCOUNTER — Other Ambulatory Visit: Payer: Self-pay | Admitting: Internal Medicine

## 2015-06-18 DIAGNOSIS — M25512 Pain in left shoulder: Secondary | ICD-10-CM | POA: Diagnosis not present

## 2015-06-18 DIAGNOSIS — M25612 Stiffness of left shoulder, not elsewhere classified: Secondary | ICD-10-CM | POA: Diagnosis not present

## 2015-06-23 ENCOUNTER — Other Ambulatory Visit: Payer: Self-pay | Admitting: *Deleted

## 2015-06-23 DIAGNOSIS — M25612 Stiffness of left shoulder, not elsewhere classified: Secondary | ICD-10-CM | POA: Diagnosis not present

## 2015-06-23 DIAGNOSIS — M25512 Pain in left shoulder: Secondary | ICD-10-CM | POA: Diagnosis not present

## 2015-06-23 MED ORDER — LISINOPRIL 20 MG PO TABS: ORAL_TABLET | ORAL | Status: DC

## 2015-06-25 DIAGNOSIS — M25612 Stiffness of left shoulder, not elsewhere classified: Secondary | ICD-10-CM | POA: Diagnosis not present

## 2015-06-25 DIAGNOSIS — M25512 Pain in left shoulder: Secondary | ICD-10-CM | POA: Diagnosis not present

## 2015-06-30 DIAGNOSIS — M25612 Stiffness of left shoulder, not elsewhere classified: Secondary | ICD-10-CM | POA: Diagnosis not present

## 2015-06-30 DIAGNOSIS — M25512 Pain in left shoulder: Secondary | ICD-10-CM | POA: Diagnosis not present

## 2015-07-02 DIAGNOSIS — M25512 Pain in left shoulder: Secondary | ICD-10-CM | POA: Diagnosis not present

## 2015-07-02 DIAGNOSIS — M25612 Stiffness of left shoulder, not elsewhere classified: Secondary | ICD-10-CM | POA: Diagnosis not present

## 2015-07-08 DIAGNOSIS — M25612 Stiffness of left shoulder, not elsewhere classified: Secondary | ICD-10-CM | POA: Diagnosis not present

## 2015-07-08 DIAGNOSIS — M25512 Pain in left shoulder: Secondary | ICD-10-CM | POA: Diagnosis not present

## 2015-07-09 DIAGNOSIS — G44059 Short lasting unilateral neuralgiform headache with conjunctival injection and tearing (SUNCT), not intractable: Secondary | ICD-10-CM | POA: Diagnosis not present

## 2015-07-14 DIAGNOSIS — M25512 Pain in left shoulder: Secondary | ICD-10-CM | POA: Diagnosis not present

## 2015-07-14 DIAGNOSIS — M25612 Stiffness of left shoulder, not elsewhere classified: Secondary | ICD-10-CM | POA: Diagnosis not present

## 2015-07-21 DIAGNOSIS — M25612 Stiffness of left shoulder, not elsewhere classified: Secondary | ICD-10-CM | POA: Diagnosis not present

## 2015-07-21 DIAGNOSIS — M25512 Pain in left shoulder: Secondary | ICD-10-CM | POA: Diagnosis not present

## 2015-07-23 DIAGNOSIS — M25512 Pain in left shoulder: Secondary | ICD-10-CM | POA: Diagnosis not present

## 2015-07-23 DIAGNOSIS — M25612 Stiffness of left shoulder, not elsewhere classified: Secondary | ICD-10-CM | POA: Diagnosis not present

## 2015-07-27 DIAGNOSIS — G500 Trigeminal neuralgia: Secondary | ICD-10-CM | POA: Diagnosis not present

## 2015-07-28 DIAGNOSIS — M25512 Pain in left shoulder: Secondary | ICD-10-CM | POA: Diagnosis not present

## 2015-07-28 DIAGNOSIS — M25612 Stiffness of left shoulder, not elsewhere classified: Secondary | ICD-10-CM | POA: Diagnosis not present

## 2015-07-29 DIAGNOSIS — G500 Trigeminal neuralgia: Secondary | ICD-10-CM | POA: Diagnosis not present

## 2015-07-29 DIAGNOSIS — G44059 Short lasting unilateral neuralgiform headache with conjunctival injection and tearing (SUNCT), not intractable: Secondary | ICD-10-CM | POA: Diagnosis not present

## 2015-07-30 DIAGNOSIS — M25612 Stiffness of left shoulder, not elsewhere classified: Secondary | ICD-10-CM | POA: Diagnosis not present

## 2015-07-30 DIAGNOSIS — M25512 Pain in left shoulder: Secondary | ICD-10-CM | POA: Diagnosis not present

## 2015-08-01 DIAGNOSIS — M25512 Pain in left shoulder: Secondary | ICD-10-CM | POA: Diagnosis not present

## 2015-08-01 DIAGNOSIS — M25612 Stiffness of left shoulder, not elsewhere classified: Secondary | ICD-10-CM | POA: Diagnosis not present

## 2015-08-05 DIAGNOSIS — M25612 Stiffness of left shoulder, not elsewhere classified: Secondary | ICD-10-CM | POA: Diagnosis not present

## 2015-08-05 DIAGNOSIS — M25512 Pain in left shoulder: Secondary | ICD-10-CM | POA: Diagnosis not present

## 2015-08-06 DIAGNOSIS — M25612 Stiffness of left shoulder, not elsewhere classified: Secondary | ICD-10-CM | POA: Diagnosis not present

## 2015-08-06 DIAGNOSIS — M25512 Pain in left shoulder: Secondary | ICD-10-CM | POA: Diagnosis not present

## 2015-08-11 DIAGNOSIS — M25512 Pain in left shoulder: Secondary | ICD-10-CM | POA: Diagnosis not present

## 2015-08-11 DIAGNOSIS — M25612 Stiffness of left shoulder, not elsewhere classified: Secondary | ICD-10-CM | POA: Diagnosis not present

## 2015-08-13 DIAGNOSIS — M25612 Stiffness of left shoulder, not elsewhere classified: Secondary | ICD-10-CM | POA: Diagnosis not present

## 2015-08-13 DIAGNOSIS — M25512 Pain in left shoulder: Secondary | ICD-10-CM | POA: Diagnosis not present

## 2015-08-27 DIAGNOSIS — Z961 Presence of intraocular lens: Secondary | ICD-10-CM | POA: Diagnosis not present

## 2015-09-01 DIAGNOSIS — Z23 Encounter for immunization: Secondary | ICD-10-CM | POA: Diagnosis not present

## 2015-09-01 DIAGNOSIS — G500 Trigeminal neuralgia: Secondary | ICD-10-CM | POA: Diagnosis not present

## 2015-09-04 DIAGNOSIS — N812 Incomplete uterovaginal prolapse: Secondary | ICD-10-CM | POA: Diagnosis not present

## 2015-09-08 DIAGNOSIS — I8311 Varicose veins of right lower extremity with inflammation: Secondary | ICD-10-CM | POA: Diagnosis not present

## 2015-09-08 DIAGNOSIS — L821 Other seborrheic keratosis: Secondary | ICD-10-CM | POA: Diagnosis not present

## 2015-09-08 DIAGNOSIS — D229 Melanocytic nevi, unspecified: Secondary | ICD-10-CM | POA: Diagnosis not present

## 2015-09-08 DIAGNOSIS — Z1283 Encounter for screening for malignant neoplasm of skin: Secondary | ICD-10-CM | POA: Diagnosis not present

## 2015-09-08 DIAGNOSIS — I8393 Asymptomatic varicose veins of bilateral lower extremities: Secondary | ICD-10-CM | POA: Diagnosis not present

## 2015-09-08 DIAGNOSIS — D1800 Hemangioma unspecified site: Secondary | ICD-10-CM | POA: Diagnosis not present

## 2015-09-08 DIAGNOSIS — L814 Other melanin hyperpigmentation: Secondary | ICD-10-CM | POA: Diagnosis not present

## 2015-09-29 ENCOUNTER — Telehealth: Payer: Self-pay | Admitting: Internal Medicine

## 2015-09-29 NOTE — Telephone Encounter (Signed)
Lm on vm for pt to call back and schedule a physical..

## 2015-11-13 ENCOUNTER — Other Ambulatory Visit: Payer: Self-pay | Admitting: Internal Medicine

## 2015-11-13 DIAGNOSIS — Z1231 Encounter for screening mammogram for malignant neoplasm of breast: Secondary | ICD-10-CM

## 2016-01-05 ENCOUNTER — Ambulatory Visit
Admission: RE | Admit: 2016-01-05 | Discharge: 2016-01-05 | Disposition: A | Discharge status: Home / Self Care | Payer: Medicare Other | Source: Ambulatory Visit | Attending: Internal Medicine | Admitting: Internal Medicine

## 2016-01-05 DIAGNOSIS — Z1231 Encounter for screening mammogram for malignant neoplasm of breast: Secondary | ICD-10-CM

## 2016-01-11 DIAGNOSIS — N812 Incomplete uterovaginal prolapse: Secondary | ICD-10-CM | POA: Diagnosis not present

## 2016-01-25 DIAGNOSIS — G500 Trigeminal neuralgia: Secondary | ICD-10-CM | POA: Diagnosis not present

## 2016-02-11 ENCOUNTER — Telehealth: Payer: Self-pay | Admitting: Internal Medicine

## 2016-02-11 NOTE — Telephone Encounter (Signed)
Left msg to call office to schedule AWV with Denisa/msn °

## 2016-02-16 NOTE — Telephone Encounter (Signed)
Left msg to call office to schedule AWV with Denisa/msn °

## 2016-03-04 ENCOUNTER — Telehealth: Payer: Self-pay | Admitting: Internal Medicine

## 2016-03-04 NOTE — Telephone Encounter (Signed)
Caller name: Kelda Kellerman Relationship to patient: patient Can be reached: [phone]  Reason for call: pt called to request rx for Lisenopril to [facility]

## 2016-03-17 ENCOUNTER — Other Ambulatory Visit: Payer: Self-pay

## 2016-03-17 MED ORDER — LISINOPRIL 20 MG PO TABS: ORAL_TABLET | ORAL | Status: DC

## 2016-05-26 DIAGNOSIS — G500 Trigeminal neuralgia: Secondary | ICD-10-CM | POA: Diagnosis not present

## 2016-06-28 DIAGNOSIS — G500 Trigeminal neuralgia: Secondary | ICD-10-CM | POA: Diagnosis not present

## 2016-07-28 DIAGNOSIS — G500 Trigeminal neuralgia: Secondary | ICD-10-CM | POA: Diagnosis not present

## 2016-08-02 DIAGNOSIS — N812 Incomplete uterovaginal prolapse: Secondary | ICD-10-CM | POA: Diagnosis not present

## 2016-08-17 DIAGNOSIS — N812 Incomplete uterovaginal prolapse: Secondary | ICD-10-CM | POA: Diagnosis not present

## 2016-08-30 ENCOUNTER — Telehealth: Payer: Self-pay | Admitting: Internal Medicine

## 2016-08-30 NOTE — Telephone Encounter (Signed)
I called pt and left a vm to call office to sch AWV. Thank you! °

## 2016-09-05 DIAGNOSIS — Z23 Encounter for immunization: Secondary | ICD-10-CM | POA: Diagnosis not present

## 2016-09-12 DIAGNOSIS — L578 Other skin changes due to chronic exposure to nonionizing radiation: Secondary | ICD-10-CM | POA: Diagnosis not present

## 2016-09-12 DIAGNOSIS — L814 Other melanin hyperpigmentation: Secondary | ICD-10-CM | POA: Diagnosis not present

## 2016-09-12 DIAGNOSIS — L821 Other seborrheic keratosis: Secondary | ICD-10-CM | POA: Diagnosis not present

## 2016-09-12 DIAGNOSIS — D229 Melanocytic nevi, unspecified: Secondary | ICD-10-CM | POA: Diagnosis not present

## 2016-09-12 DIAGNOSIS — Z1283 Encounter for screening for malignant neoplasm of skin: Secondary | ICD-10-CM | POA: Diagnosis not present

## 2016-09-12 DIAGNOSIS — I8393 Asymptomatic varicose veins of bilateral lower extremities: Secondary | ICD-10-CM | POA: Diagnosis not present

## 2016-09-12 DIAGNOSIS — I781 Nevus, non-neoplastic: Secondary | ICD-10-CM | POA: Diagnosis not present

## 2016-09-12 DIAGNOSIS — D1800 Hemangioma unspecified site: Secondary | ICD-10-CM | POA: Diagnosis not present

## 2016-10-06 ENCOUNTER — Other Ambulatory Visit: Payer: Self-pay

## 2016-10-14 ENCOUNTER — Ambulatory Visit (INDEPENDENT_AMBULATORY_CARE_PROVIDER_SITE_OTHER): Payer: Medicare Other

## 2016-10-14 VITALS — BP 138/72 | HR 73 | Temp 97.4°F | Resp 12 | Ht 65.0 in | Wt 158.8 lb

## 2016-10-14 DIAGNOSIS — Z0000 Encounter for general adult medical examination without abnormal findings: Secondary | ICD-10-CM

## 2016-10-14 NOTE — Patient Instructions (Addendum)
Denise Barry , Thank you for taking time to come for your Medicare Wellness Visit. I appreciate your ongoing commitment to your health goals. Please review the following plan we discussed and let me know if I can assist you in the future.   Follow up with Dr. Nicki Reaper as needed.  These are the goals we discussed: Goals    . Increase physical activity          Stay active and drink plenty of fluids Stay active and continue to exercise Low carb foods.  Vegetables, lean meats       This is a list of the screening recommended for you and due dates:  Health Maintenance  Topic Date Due  . Tetanus Vaccine  01/01/2023  . Flu Shot  Completed  . DEXA scan (bone density measurement)  Completed  . Shingles Vaccine  Completed  . Pneumonia vaccines  Completed      Hearing Loss Introduction Hearing loss is a partial or total loss of the ability to hear. This can be temporary or permanent, and it can happen in one or both ears. Hearing loss may be referred to as deafness. Medical care is necessary to treat hearing loss properly and to prevent the condition from getting worse. Your hearing may partially or completely come back, depending on what caused your hearing loss and how severe it is. In some cases, hearing loss is permanent. What are the causes? Common causes of hearing loss include:  Too much wax in the ear canal.  Infection of the ear canal or middle ear.  Fluid in the middle ear.  Injury to the ear or surrounding area.  An object stuck in the ear.  Prolonged exposure to loud sounds, such as music. Less common causes of hearing loss include:  Tumors in the ear.  Viral or bacterial infections, such as meningitis.  A hole in the eardrum (perforated eardrum).  Problems with the hearing nerve that sends signals between the brain and the ear.  Certain medicines. What are the signs or symptoms? Symptoms of this condition may include:  Difficulty telling the difference  between sounds.  Difficulty following a conversation when there is background noise.  Lack of response to sounds in your environment. This may be most noticeable when you do not respond to startling sounds.  Needing to turn up the volume on the television, radio, etc.  Ringing in the ears.  Dizziness.  Pain in the ears. How is this diagnosed? This condition is diagnosed based on a physical exam and a hearing test (audiometry). The audiometry test will be performed by a hearing specialist (audiologist). You may also be referred to an ear, nose, and throat (ENT) specialist (otolaryngologist). How is this treated? Treatment for recent onset of hearing loss may include:  Ear wax removal.  Being prescribed medicines to prevent infection (antibiotics).  Being prescribed medicines to reduce inflammation (corticosteroids). Follow these instructions at home:  If you were prescribed an antibiotic medicine, take it as told by your health care provider. Do not stop taking the antibiotic even if you start to feel better.  Take over-the-counter and prescription medicines only as told by your health care provider.  Avoid loud noises.  Return to your normal activities as told by your health care provider. Ask your health care provider what activities are safe for you.  Keep all follow-up visits as told by your health care provider. This is important. Contact a health care provider if:  You feel dizzy.  You develop new symptoms.  You vomit or feel nauseous.  You have a fever. Get help right away if:  You develop sudden changes in your vision.  You have severe ear pain.  You have new or increased weakness.  You have a severe headache. This information is not intended to replace advice given to you by your health care provider. Make sure you discuss any questions you have with your health care provider. Document Released: 10/17/2005 Document Revised: 03/24/2016 Document Reviewed:  03/04/2015  2017 Elsevier

## 2016-10-14 NOTE — Progress Notes (Signed)
Subjective:   Denise Barry is a 82 y.o. female who presents for an Initial Medicare Annual Wellness Visit.  Review of Systems    No ROS.  Medicare Wellness Visit.  Cardiac Risk Factors include: advanced age (>68men, >98 women);hypertension     Objective:    Today's Vitals   10/14/16 1340  BP: 138/72  Pulse: 73  Resp: 12  Temp: 97.4 F (36.3 C)  TempSrc: Oral  SpO2: 97%  Weight: 158 lb 12.8 oz (72 kg)  Height: 5\' 5"  (1.651 m)   Body mass index is 26.43 kg/m.   Current Medications (verified) Outpatient Encounter Prescriptions as of 10/14/2016  Medication Sig  . Acetaminophen (TYLENOL EXTRA STRENGTH PO) Take 1-2 tablets by mouth daily.  Marland Kitchen aspirin 81 MG tablet Take 81 mg by mouth daily.  . Eslicarbazepine Acetate (APTIOM PO) Take 1 tablet by mouth 2 (two) times daily.  . fexofenadine (ALLEGRA) 180 MG tablet Take 180 mg by mouth as needed for allergies or rhinitis.  . IRON PO Take by mouth daily.  Marland Kitchen lisinopril (PRINIVIL,ZESTRIL) 20 MG tablet TAKE (1) TABLET BY MOUTH EVERY DAY  . Multiple Vitamins-Minerals (CENTRUM SILVER ULTRA WOMENS PO) Take by mouth.  . [DISCONTINUED] VITAMIN D, CHOLECALCIFEROL, PO Take by mouth daily.  . [DISCONTINUED] Bisacodyl (DULCOLAX PO) Take by mouth daily.  . [DISCONTINUED] pregabalin (LYRICA) 50 MG capsule Take 50 mg by mouth 3 (three) times daily.   No facility-administered encounter medications on file as of 10/14/2016.     Allergies (verified) Codeine; Cyclobenzaprine; Methocarbamol; Carbamazepine; and Gabapentin   History: Past Medical History:  Diagnosis Date  . Hypercholesterolemia   . Hypertension   . Osteoarthritis   . Urinary incontinence    pessary in place  . Varicose veins    H/O   Past Surgical History:  Procedure Laterality Date  . BACK SURGERY  09/16/13  . DILATION AND CURETTAGE OF UTERUS  1971  . sclerosis  2000  . TUBAL LIGATION  1972   Family History  Problem Relation Age of Onset  . Heart disease  Mother   . Diabetes Mother   . Hypertension Mother   . Heart disease Father   . Stroke Father    Social History   Occupational History  . Not on file.   Social History Main Topics  . Smoking status: Former Research scientist (life sciences)  . Smokeless tobacco: Never Used  . Alcohol use No  . Drug use: No  . Sexual activity: No    Tobacco Counseling Counseling given: Not Answered   Activities of Daily Living In your present state of health, do you have any difficulty performing the following activities: 10/14/2016  Hearing? N  Vision? N  Difficulty concentrating or making decisions? N  Walking or climbing stairs? Y  Dressing or bathing? N  Doing errands, shopping? N  Preparing Food and eating ? N  Using the Toilet? N  In the past six months, have you accidently leaked urine? Y  Do you have problems with loss of bowel control? N  Managing your Medications? N  Managing your Finances? N  Housekeeping or managing your Housekeeping? N  Some recent data might be hidden    Immunizations and Health Maintenance Immunization History  Administered Date(s) Administered  . Influenza Split 09/30/2013, 07/03/2014  . Influenza-Unspecified 07/01/2016  . Pneumococcal Conjugate-13 01/06/2014  . Pneumococcal Polysaccharide-23 11/15/2015  . Tdap 12/30/2012  . Zoster 11/15/2015   There are no preventive care reminders to display for this patient.  Patient Care Team: Einar Pheasant, MD as PCP - General (Internal Medicine)  Indicate any recent Medical Services you may have received from other than [facility] providers in the past year (date may be approximate).     Assessment:   This is a routine wellness examination for Denise Barry. The goal of the wellness visit is to assist the patient how to close the gaps in care and create a preventative care plan for the patient.   Osteoporosis risk reviewed.  Medications reviewed; taking without issues or barriers.  Safety issues reviewed; lives alone.  Smoke  detectors in the home. No firearms in the home. Wears seatbelts when driving or riding with others. No violence in the home.  No identified risk were noted; The patient was oriented x 3; appropriate in dress and manner and no objective failures at ADL's or IADL's.   BMI; discussed the importance of a healthy diet, water intake and exercise. Educational material provided.  HTN; controlled with medication.  Followed by PCP.  Health maintenance gap; closed.  Patient Concerns: None at this time. Follow up with PCP as needed.  Hearing/Vision screen Hearing Screening Comments: Passes the whisper test Admits difficulty hearing in a crowd Audiology testing deferred per patient request   Vision Screening Comments: Followed by [facility] Last OV 12/2015 Wears glasses Bilateral cataracts extracted  Dietary issues and exercise activities discussed: Current Exercise Habits: Home exercise routine;Structured exercise class, Time (Minutes): 20, Frequency (Times/Week): 3, Weekly Exercise (Minutes/Week): 60, Intensity: Mild  Goals    . Increase physical activity          Stay active and drink plenty of fluids Stay active and continue to exercise Low carb foods.  Vegetables, lean meats      Depression Screen PHQ 2/9 Scores 10/14/2016 05/07/2015 01/06/2014  PHQ - 2 Score 0 0 0    Fall Risk Fall Risk  10/14/2016 05/07/2015 01/06/2014  Falls in the past year? No Yes No  Number falls in past yr: - 1 -  Injury with Fall? - Yes -    Cognitive Function:     6CIT Screen 10/14/2016  What Year? 0 points  What month? 0 points  What time? 0 points  Count back from 20 0 points  Months in reverse 0 points  Repeat phrase 0 points  Total Score 0    Screening Tests Health Maintenance  Topic Date Due  . TETANUS/TDAP  01/01/2023  . INFLUENZA VACCINE  Completed  . DEXA SCAN  Completed  . ZOSTAVAX  Completed  . PNA vac Low Risk Adult  Completed      Plan:    End of life planning;  Advance aging; Advanced directives discussed. Copy of current HCPOA/Living Will requested.  Medicare Attestation I have personally reviewed: The patient's medical and social history Their use of alcohol, tobacco or illicit drugs Their current medications and supplements The patient's functional ability including ADLs,fall risks, home safety risks, cognitive, and hearing and visual impairment Diet and physical activities Evidence for depression   The patient's weight, height, BMI, and visual acuity have been recorded in the chart.  I have made referrals and provided education to the patient based on review of the above and I have provided the patient with a written personalized care plan for preventive services.    During the course of the visit, Merissa was educated and counseled about the following appropriate screening and preventive services:   Vaccines to include Pneumoccal, Influenza, Hepatitis B, Td, Zostavax, HCV  Electrocardiogram  Cardiovascular disease screening  Colorectal cancer screening  Bone density screening  Diabetes screening  Glaucoma screening  Mammography/PAP  Nutrition counseling  Smoking cessation counseling  Patient Instructions (the written plan) were given to the patient.    Varney Biles, LPN   [id number]    Reviewed above information.  Agree with plan.   Dr Nicki Reaper

## 2016-10-20 ENCOUNTER — Other Ambulatory Visit: Payer: Self-pay | Admitting: Internal Medicine

## 2016-10-20 DIAGNOSIS — Z1231 Encounter for screening mammogram for malignant neoplasm of breast: Secondary | ICD-10-CM

## 2016-10-28 ENCOUNTER — Ambulatory Visit: Payer: Medicare Other

## 2016-11-15 ENCOUNTER — Other Ambulatory Visit: Payer: Self-pay | Admitting: Internal Medicine

## 2017-01-03 ENCOUNTER — Ambulatory Visit
Admission: EM | Admit: 2017-01-03 | Discharge: 2017-01-03 | Disposition: A | Discharge status: Home / Self Care | Payer: Medicare Other | Attending: Family Medicine | Admitting: Family Medicine

## 2017-01-03 ENCOUNTER — Telehealth: Payer: Self-pay | Admitting: Obstetrics and Gynecology

## 2017-01-03 ENCOUNTER — Encounter: Payer: Self-pay | Admitting: *Deleted

## 2017-01-03 DIAGNOSIS — J069 Acute upper respiratory infection, unspecified: Secondary | ICD-10-CM | POA: Diagnosis not present

## 2017-01-03 DIAGNOSIS — R05 Cough: Secondary | ICD-10-CM | POA: Diagnosis present

## 2017-01-03 DIAGNOSIS — I10 Essential (primary) hypertension: Secondary | ICD-10-CM | POA: Insufficient documentation

## 2017-01-03 DIAGNOSIS — Z87891 Personal history of nicotine dependence: Secondary | ICD-10-CM | POA: Insufficient documentation

## 2017-01-03 DIAGNOSIS — E7800 Pure hypercholesterolemia, unspecified: Secondary | ICD-10-CM | POA: Diagnosis not present

## 2017-01-03 DIAGNOSIS — Z7982 Long term (current) use of aspirin: Secondary | ICD-10-CM | POA: Insufficient documentation

## 2017-01-03 DIAGNOSIS — M1990 Unspecified osteoarthritis, unspecified site: Secondary | ICD-10-CM | POA: Insufficient documentation

## 2017-01-03 DIAGNOSIS — I8390 Asymptomatic varicose veins of unspecified lower extremity: Secondary | ICD-10-CM | POA: Diagnosis not present

## 2017-01-03 DIAGNOSIS — J029 Acute pharyngitis, unspecified: Secondary | ICD-10-CM | POA: Insufficient documentation

## 2017-01-03 LAB — RAPID STREP SCREEN (MED CTR MEBANE ONLY): Streptococcus, Group A Screen (Direct): NEGATIVE

## 2017-01-03 MED ORDER — BENZONATATE 200 MG PO CAPS: 200.0000 mg | ORAL_CAPSULE | Freq: Three times a day (TID) | ORAL | 0 refills | Status: DC

## 2017-01-03 MED ORDER — AZITHROMYCIN 250 MG PO TABS: 250.0000 mg | ORAL_TABLET | Freq: Every day | ORAL | 0 refills | Status: DC

## 2017-01-03 NOTE — ED Triage Notes (Signed)
Non-productive cough, sore throat, head and chest congestion , runny nose, chills, and weakness, x2 days.

## 2017-01-03 NOTE — Telephone Encounter (Signed)
Opened in error

## 2017-01-03 NOTE — ED Provider Notes (Signed)
CSN: [id number]     Arrival date & time 01/03/17  0848 History   First MD Initiated Contact with Patient 01/03/17 1030     Chief Complaint  Patient presents with  . Cough  . Sore Throat  . Nasal Congestion  . Fatigue  . Generalized Body Aches   (Consider location/radiation/quality/duration/timing/severity/associated sxs/prior Treatment) HPI  83 year old female who presents with a nonproductive cough sore throat head and chest congestion runny nose chills and weakness. She states that she just came back from the beach and it was very windy and cold. She said that her sore throat and cough is bothering her the most. She has very limited body aches. Been afebrile temperature today 98.7 pulse rate is 107 blood pressure 147/64 respirations 16 and O2 sats are 97% on room air.        Past Medical History:  Diagnosis Date  . Hypercholesterolemia   . Hypertension   . Osteoarthritis   . Urinary incontinence    pessary in place  . Varicose veins    H/O   Past Surgical History:  Procedure Laterality Date  . BACK SURGERY  09/16/13  . DILATION AND CURETTAGE OF UTERUS  1971  . JOINT REPLACEMENT    . sclerosis  2000  . TUBAL LIGATION  1972   Family History  Problem Relation Age of Onset  . Heart disease Mother   . Diabetes Mother   . Hypertension Mother   . Heart disease Father   . Stroke Father    Social History  Substance Use Topics  . Smoking status: Former Research scientist (life sciences)  . Smokeless tobacco: Never Used  . Alcohol use No   OB History    No data available     Review of Systems  Constitutional: Positive for activity change. Negative for chills, fatigue and fever.  HENT: Positive for congestion, postnasal drip and sore throat.   Respiratory: Positive for cough. Negative for shortness of breath, wheezing and stridor.   All other systems reviewed and are negative.   Allergies  Codeine; Cyclobenzaprine; Methocarbamol; Carbamazepine; and Gabapentin  Home Medications   Prior  to Admission medications   Medication Sig Start Date End Date Taking? Authorizing Provider  aspirin 81 MG tablet Take 81 mg by mouth daily.   Yes Historical Provider, MD  Eslicarbazepine Acetate (APTIOM PO) Take 1 tablet by mouth 2 (two) times daily.   Yes Historical Provider, MD  lisinopril (PRINIVIL,ZESTRIL) 20 MG tablet TAKE 1 TABLET DAILY 11/15/16  Yes Einar Pheasant, MD  Acetaminophen (TYLENOL EXTRA STRENGTH PO) Take 1-2 tablets by mouth daily.    Historical Provider, MD  azithromycin (ZITHROMAX) 250 MG tablet Take 1 tablet (250 mg total) by mouth daily. Take first 2 tablets together, then 1 every day until finished. 01/03/17   Lorin Picket, PA-C  benzonatate (TESSALON) 200 MG capsule Take 1 capsule (200 mg total) by mouth every 8 (eight) hours. 01/03/17   Lorin Picket, PA-C  fexofenadine (ALLEGRA) 180 MG tablet Take 180 mg by mouth as needed for allergies or rhinitis.    Historical Provider, MD  IRON PO Take by mouth daily.    Historical Provider, MD  Multiple Vitamins-Minerals (CENTRUM SILVER ULTRA WOMENS PO) Take by mouth.    Historical Provider, MD   Meds Ordered and Administered this Visit  Medications - No data to display  BP (!) 147/64 (BP Location: Left Arm)   Pulse (!) 107   Temp 98.7 F (37.1 C) (Oral)   Resp 16  Ht 5\' 8"  (1.727 m)   Wt 160 lb (72.6 kg)   SpO2 97%   BMI 24.33 kg/m  No data found.   Physical Exam  Constitutional: She is oriented to person, place, and time. She appears well-developed and well-nourished. No distress.  HENT:  Head: Normocephalic and atraumatic.  Right Ear: External ear normal.  Left Ear: External ear normal.  Nose: Nose normal.  Mouth/Throat: Oropharynx is clear and moist. No oropharyngeal exudate.  Both TMs are dull  Eyes: EOM are normal. Pupils are equal, round, and reactive to light. Right eye exhibits no discharge. Left eye exhibits no discharge.  Neck: Normal range of motion.  Pulmonary/Chest: Breath sounds normal. She is in  respiratory distress. She has no wheezes. She has no rales.  Musculoskeletal: Normal range of motion.  Lymphadenopathy:    She has no cervical adenopathy.  Neurological: She is alert and oriented to person, place, and time.  Skin: Skin is warm and dry. She is not diaphoretic.  Psychiatric: She has a normal mood and affect. Her behavior is normal. Judgment and thought content normal.  Nursing note and vitals reviewed.   Urgent Care Course     Procedures (including critical care time)  Labs Review Labs Reviewed  RAPID STREP SCREEN (NOT AT [facility])  CULTURE, GROUP A STREP [facility])    Imaging Review No results found.   Visual Acuity Review  Right Eye Distance:   Left Eye Distance:   Bilateral Distance:    Right Eye Near:   Left Eye Near:    Bilateral Near:         MDM   1. Upper respiratory tract infection, unspecified type    Discharge Medication List as of 01/03/2017 10:46 AM    START taking these medications   Details  azithromycin (ZITHROMAX) 250 MG tablet Take 1 tablet (250 mg total) by mouth daily. Take first 2 tablets together, then 1 every day until finished., Starting Tue 01/03/2017, Normal    benzonatate (TESSALON) 200 MG capsule Take 1 capsule (200 mg total) by mouth every 8 (eight) hours., Starting Tue 01/03/2017, Normal      Plan: 1. Test/x-ray results and diagnosis reviewed with patient 2. rx as per orders; risks, benefits, potential side effects reviewed with patient 3. Recommend supportive treatment with Rest and fluids. Use Tessalon Perles during the daytime to control cough and use Delsym at nighttime. She runs a high fever or has been productive cough shortness of breath or wheezing she should follow-up with primary care or the emergency room. If she is not improving she should follow-up with primary care 4. F/u prn if symptoms worsen or don't improve     Lorin Picket, PA-C 01/03/17 1059

## 2017-01-05 ENCOUNTER — Ambulatory Visit: Admission: RE | Admit: 2017-01-05 | Payer: Medicare Other | Source: Ambulatory Visit

## 2017-01-05 LAB — CULTURE, GROUP A STREP (THRC)

## 2017-01-09 ENCOUNTER — Ambulatory Visit: Payer: Medicare Other | Admitting: Internal Medicine

## 2017-01-11 ENCOUNTER — Ambulatory Visit
Admission: RE | Admit: 2017-01-11 | Discharge: 2017-01-11 | Disposition: A | Discharge status: Home / Self Care | Payer: Medicare Other | Source: Ambulatory Visit | Attending: Internal Medicine | Admitting: Internal Medicine

## 2017-01-11 DIAGNOSIS — N6489 Other specified disorders of breast: Secondary | ICD-10-CM | POA: Insufficient documentation

## 2017-01-11 DIAGNOSIS — Z1231 Encounter for screening mammogram for malignant neoplasm of breast: Secondary | ICD-10-CM | POA: Diagnosis not present

## 2017-01-26 DIAGNOSIS — G500 Trigeminal neuralgia: Secondary | ICD-10-CM | POA: Diagnosis not present

## 2017-02-03 ENCOUNTER — Other Ambulatory Visit: Payer: Self-pay | Admitting: Internal Medicine

## 2017-02-13 ENCOUNTER — Encounter: Payer: Self-pay | Admitting: Internal Medicine

## 2017-02-13 ENCOUNTER — Ambulatory Visit (INDEPENDENT_AMBULATORY_CARE_PROVIDER_SITE_OTHER): Payer: Medicare Other | Admitting: Internal Medicine

## 2017-02-13 DIAGNOSIS — G500 Trigeminal neuralgia: Secondary | ICD-10-CM

## 2017-02-13 DIAGNOSIS — Z0000 Encounter for general adult medical examination without abnormal findings: Secondary | ICD-10-CM | POA: Diagnosis not present

## 2017-02-13 DIAGNOSIS — E7800 Pure hypercholesterolemia, unspecified: Secondary | ICD-10-CM | POA: Diagnosis not present

## 2017-02-13 DIAGNOSIS — I10 Essential (primary) hypertension: Secondary | ICD-10-CM

## 2017-02-13 MED ORDER — LISINOPRIL 20 MG PO TABS: 20.0000 mg | ORAL_TABLET | Freq: Every day | ORAL | 1 refills | Status: DC

## 2017-02-13 NOTE — Progress Notes (Signed)
Pre-visit discussion using our clinic review tool. No additional management support is needed unless otherwise documented below in the visit note.  

## 2017-02-13 NOTE — Progress Notes (Signed)
Patient ID: Denise Barry, female   DOB: Aug 07, 1934, 83 y.o.   MRN: [id number]   Subjective:    Patient ID: Denise Barry, female    DOB: Jun 08, 1934, 83 y.o.   MRN: [id number]  HPI  Patient here for a scheduled follow up.  I have not seen her since 06/2015.  States she is doing well.  Feels good.  Stays active.  Just returned from the beach.  No chest pain.  No sob.  No acid reflux.  No abdominal pain.  Bowels moving.  States trigeminal neuralgia pain is better on aptiom.  Overall feels good.    Past Medical History:  Diagnosis Date  . Hypercholesterolemia   . Hypertension   . Osteoarthritis   . Urinary incontinence    pessary in place  . Varicose veins    H/O   Past Surgical History:  Procedure Laterality Date  . BACK SURGERY  09/16/13  . DILATION AND CURETTAGE OF UTERUS  1971  . JOINT REPLACEMENT    . sclerosis  2000  . TUBAL LIGATION  1972   Family History  Problem Relation Age of Onset  . Heart disease Mother   . Diabetes Mother   . Hypertension Mother   . Heart disease Father   . Stroke Father   . Breast cancer Neg Hx    Social History   Social History  . Marital status: Widowed    Spouse name: N/A  . Number of children: N/A  . Years of education: N/A   Social History Main Topics  . Smoking status: Former Research scientist (life sciences)  . Smokeless tobacco: Never Used  . Alcohol use No  . Drug use: No  . Sexual activity: No   Other Topics Concern  . None   Social History Narrative  . None    Outpatient Encounter Prescriptions as of 02/13/2017  Medication Sig  . Acetaminophen (TYLENOL EXTRA STRENGTH PO) Take 1-2 tablets by mouth daily.  Marland Kitchen aspirin 81 MG tablet Take 81 mg by mouth daily.  . Eslicarbazepine Acetate (APTIOM PO) Take 1 tablet by mouth 2 (two) times daily.  . fexofenadine (ALLEGRA) 180 MG tablet Take 180 mg by mouth as needed for allergies or rhinitis.  Marland Kitchen lisinopril (PRINIVIL,ZESTRIL) 20 MG tablet Take 1 tablet (20 mg total) by mouth daily.  .  Multiple Vitamins-Minerals (CENTRUM SILVER ULTRA WOMENS PO) Take by mouth.  . [DISCONTINUED] lisinopril (PRINIVIL,ZESTRIL) 20 MG tablet TAKE 1 TABLET DAILY  . [DISCONTINUED] azithromycin (ZITHROMAX) 250 MG tablet Take 1 tablet (250 mg total) by mouth daily. Take first 2 tablets together, then 1 every day until finished.  . [DISCONTINUED] benzonatate (TESSALON) 200 MG capsule Take 1 capsule (200 mg total) by mouth every 8 (eight) hours.  . [DISCONTINUED] IRON PO Take by mouth daily.   No facility-administered encounter medications on file as of 02/13/2017.     Review of Systems  Constitutional: Negative for appetite change and unexpected weight change.  HENT: Negative for congestion and sinus pressure.   Respiratory: Negative for cough, chest tightness and shortness of breath.   Cardiovascular: Negative for chest pain, palpitations and leg swelling.  Gastrointestinal: Negative for abdominal pain, diarrhea, nausea and vomiting.  Genitourinary: Negative for difficulty urinating and dysuria.  Musculoskeletal: Negative for back pain and joint swelling.  Skin: Negative for color change and rash.  Neurological: Negative for dizziness, light-headedness and headaches.  Psychiatric/Behavioral: Negative for agitation and dysphoric mood.       Objective:    Physical Exam  Constitutional: She appears well-developed and well-nourished. No distress.  HENT:  Nose: Nose normal.  Mouth/Throat: Oropharynx is clear and moist.  Neck: Neck supple. No thyromegaly present.  Cardiovascular: Normal rate and regular rhythm.   Pulmonary/Chest: Breath sounds normal. No respiratory distress. She has no wheezes.  Abdominal: Soft. Bowel sounds are normal. There is no tenderness.  Musculoskeletal: She exhibits no edema or tenderness.  Lymphadenopathy:    She has no cervical adenopathy.  Skin: No rash noted. No erythema.  Psychiatric: She has a normal mood and affect. Her behavior is normal.    BP 124/87 (BP  Location: Left Arm, Patient Position: Sitting, Cuff Size: Normal)   Pulse 67   Temp 97.4 F (36.3 C) (Oral)   Resp 12   Ht 5\' 8"  (1.727 m)   Wt 158 lb 9.6 oz (71.9 kg)   SpO2 96%   BMI 24.12 kg/m  Wt Readings from Last 3 Encounters:  02/13/17 158 lb 9.6 oz (71.9 kg)  01/03/17 160 lb (72.6 kg)  10/14/16 158 lb 12.8 oz (72 kg)     Lab Results  Component Value Date   WBC 6.4 05/07/2015   HGB 13.2 05/07/2015   HCT 39.3 05/07/2015   PLT 253.0 05/07/2015   GLUCOSE 87 06/01/2015   CHOL 195 04/09/2015   TRIG 83.0 04/09/2015   HDL 67.10 04/09/2015   LDLCALC 111 (H) 04/09/2015   ALT 10 04/09/2015   AST 14 04/09/2015   NA 142 06/01/2015   K 4.2 06/01/2015   CL 105 06/01/2015   CREATININE 0.85 06/01/2015   BUN 20 06/01/2015   CO2 31 06/01/2015   TSH 1.85 04/10/2015   INR 1.0 02/03/2015    Mm Digital Screening Bilateral  Result Date: 01/11/2017 CLINICAL DATA:  Screening. EXAM: DIGITAL SCREENING BILATERAL MAMMOGRAM WITH CAD COMPARISON:  Previous exam(s). ACR Breast Density Category b: There are scattered areas of fibroglandular density. FINDINGS: There are no findings suspicious for malignancy. Images were processed with CAD. IMPRESSION: No mammographic evidence of malignancy. A result letter of this screening mammogram will be mailed directly to the patient. RECOMMENDATION: Screening mammogram in one year. (Code:SM-B-01Y) BI-RADS CATEGORY  1: Negative. Electronically Signed   By: Lillia Mountain M.D.   On: 01/11/2017 10:17       Assessment & Plan:   Problem List Items Addressed This Visit    Essential hypertension, benign    Blood pressure elevated on my recheck.  Discussed with her today.  She will spot check her pressure.  Get her back in soon to reassess.  Follow.        Relevant Medications   lisinopril (PRINIVIL,ZESTRIL) 20 MG tablet   Other Relevant Orders   CBC with Differential/Platelet   TSH   Basic metabolic panel   Health care maintenance     Mammogram 01/11/17 -  Birads I.        Hypercholesterolemia    Low cholesterol diet and exercise.  Follow  Lipid panel.        Relevant Medications   lisinopril (PRINIVIL,ZESTRIL) 20 MG tablet   Other Relevant Orders   Lipid panel   Hepatic function panel   Trigeminal neuralgia    On aptiom.  Doing well on this medication.  Follow.           Einar Pheasant, MD

## 2017-02-19 ENCOUNTER — Encounter: Payer: Self-pay | Admitting: Internal Medicine

## 2017-02-19 NOTE — Assessment & Plan Note (Addendum)
Mammogram 01/11/17 - Birads I.

## 2017-02-19 NOTE — Assessment & Plan Note (Signed)
Low cholesterol diet and exercise.  Follow  Lipid panel.   

## 2017-02-19 NOTE — Assessment & Plan Note (Signed)
On aptiom.  Doing well on this medication.  Follow.

## 2017-02-19 NOTE — Assessment & Plan Note (Signed)
Blood pressure elevated on my recheck.  Discussed with her today.  She will spot check her pressure.  Get her back in soon to reassess.  Follow.

## 2017-02-22 ENCOUNTER — Telehealth: Payer: Self-pay | Admitting: Internal Medicine

## 2017-02-22 ENCOUNTER — Other Ambulatory Visit (INDEPENDENT_AMBULATORY_CARE_PROVIDER_SITE_OTHER): Payer: Medicare Other

## 2017-02-22 DIAGNOSIS — I10 Essential (primary) hypertension: Secondary | ICD-10-CM | POA: Diagnosis not present

## 2017-02-22 DIAGNOSIS — E7800 Pure hypercholesterolemia, unspecified: Secondary | ICD-10-CM

## 2017-02-22 LAB — LIPID PANEL
CHOLESTEROL: 205 mg/dL — AB (ref 0–200)
HDL: 71.3 mg/dL (ref >39.00)
LDL Cholesterol: 115 mg/dL — ABNORMAL HIGH (ref 0–99)
NonHDL: 134
TRIGLYCERIDES: 94 mg/dL (ref 0.0–149.0)
Total CHOL/HDL Ratio: 3
VLDL: 18.8 mg/dL (ref 0.0–40.0)

## 2017-02-22 LAB — HEPATIC FUNCTION PANEL
ALBUMIN: 4 g/dL (ref 3.5–5.2)
ALT: 10 U/L (ref 0–35)
AST: 15 U/L (ref 0–37)
Alkaline Phosphatase: 76 U/L (ref 39–117)
BILIRUBIN DIRECT: 0.1 mg/dL (ref 0.0–0.3)
BILIRUBIN TOTAL: 0.4 mg/dL (ref 0.2–1.2)
Total Protein: 6.7 g/dL (ref 6.0–8.3)

## 2017-02-22 LAB — CBC WITH DIFFERENTIAL/PLATELET
BASOS ABS: 0 10*3/uL (ref 0.0–0.1)
BASOS PCT: 0.3 % (ref 0.0–3.0)
EOS PCT: 2 % (ref 0.0–5.0)
Eosinophils Absolute: 0.2 10*3/uL (ref 0.0–0.7)
HEMATOCRIT: 38 % (ref 36.0–46.0)
Hemoglobin: 12.6 g/dL (ref 12.0–15.0)
LYMPHS PCT: 15.2 % (ref 12.0–46.0)
Lymphs Abs: 1.5 10*3/uL (ref 0.7–4.0)
MCHC: 33.1 g/dL (ref 30.0–36.0)
MCV: 93.1 fl (ref 78.0–100.0)
MONOS PCT: 9.9 % (ref 3.0–12.0)
Monocytes Absolute: 1 10*3/uL (ref 0.1–1.0)
NEUTROS ABS: 7.1 10*3/uL (ref 1.4–7.7)
Neutrophils Relative %: 72.6 % (ref 43.0–77.0)
PLATELETS: 301 10*3/uL (ref 150.0–400.0)
RBC: 4.08 Mil/uL (ref 3.87–5.11)
RDW: 14.2 % (ref 11.5–15.5)
WBC: 9.8 10*3/uL (ref 4.0–10.5)

## 2017-02-22 LAB — BASIC METABOLIC PANEL
BUN: 17 mg/dL (ref 6–23)
CALCIUM: 9.5 mg/dL (ref 8.4–10.5)
CO2: 30 mEq/L (ref 19–32)
Chloride: 98 mEq/L (ref 96–112)
Creatinine, Ser: 0.91 mg/dL (ref 0.40–1.20)
GFR: 63.11 mL/min (ref >60.00)
Glucose, Bld: 91 mg/dL (ref 70–99)
Potassium: 4.3 mEq/L (ref 3.5–5.1)
SODIUM: 134 meq/L — AB (ref 135–145)

## 2017-02-22 LAB — TSH: TSH: 5.1 u[IU]/mL — ABNORMAL HIGH (ref 0.35–4.50)

## 2017-02-22 MED ORDER — LISINOPRIL 40 MG PO TABS: 40.0000 mg | ORAL_TABLET | Freq: Every day | ORAL | 0 refills | Status: DC

## 2017-02-22 NOTE — Telephone Encounter (Signed)
Patient came into office for lab appointment , ask could she have BP checked while here and compare to home cuff,? BP checked in right arm due to blood draw BP 170/80 Pulse 81, patient home cuffed checked 168/80 which is accurate for home cuff. Because BP out of range notified PCP, order for Re-check given BP 164/84 pulse 71 . PCP notified and order given to change Lisinopril to 40 mg daily. and patient to follow up on 03/23/17 and to bring log of BP readings to appointment. Nurse sent 30 day supply to local pharmacy until; appointment lisinopril 40 mgdaily.

## 2017-02-23 NOTE — Telephone Encounter (Signed)
Reviewed above information.  Agree with plan.   Dr Carder Yin 

## 2017-02-28 ENCOUNTER — Telehealth: Payer: Self-pay

## 2017-02-28 NOTE — Telephone Encounter (Signed)
-----   Message from Einar Pheasant, MD sent at 02/28/2017  5:23 AM EDT ----- Call and notify pt that her cholesterol levels are relatively stable.  Sodium slightly decreased, but has varied in the past.  We will follow.  Thyroid test is slightly elevated. This can just be a lab variant.  We will follow.  hgb, kidney function tests and liver function tests wnl.  Has f/u appt later this month.

## 2017-02-28 NOTE — Telephone Encounter (Signed)
Left message to return call to our office.  

## 2017-03-01 NOTE — Telephone Encounter (Signed)
Called one number did not have v/m l/m on home phone to call office

## 2017-03-02 NOTE — Telephone Encounter (Signed)
Pt called back returning your call.  Call pt @ [phone]

## 2017-03-02 NOTE — Telephone Encounter (Signed)
Patient informed did not have any questions reminded of app date and time.

## 2017-03-02 NOTE — Telephone Encounter (Signed)
Pt called returning your call. Thank you! °

## 2017-03-06 ENCOUNTER — Telehealth: Payer: Self-pay | Admitting: Internal Medicine

## 2017-03-06 NOTE — Telephone Encounter (Signed)
Return call to patient to evaluate need for muscle relaxer patient felt she had strained her back.Pain across center of back above the waist and patient stated she is having muscle spasm. Scheduled patient NP due to no history in chart of patient being prescribed a muscle relaxer appt. Scheduled for 03/07/17. FYI

## 2017-03-06 NOTE — Telephone Encounter (Signed)
Pt called about wanting to get a new Rx for a muscle relaxer pt does not know the name of the medication. Please advise?  Pharmacy  Is [facility], [place]  Call pt @ [phone]. Thank you!

## 2017-03-07 ENCOUNTER — Ambulatory Visit (INDEPENDENT_AMBULATORY_CARE_PROVIDER_SITE_OTHER): Payer: Medicare Other | Admitting: Family

## 2017-03-07 ENCOUNTER — Encounter: Payer: Self-pay | Admitting: Family

## 2017-03-07 VITALS — BP 120/76 | HR 83 | Temp 97.8°F | Ht 68.0 in | Wt 155.6 lb

## 2017-03-07 DIAGNOSIS — M62838 Other muscle spasm: Secondary | ICD-10-CM

## 2017-03-07 MED ORDER — IBUPROFEN 600 MG PO TABS: 600.0000 mg | ORAL_TABLET | Freq: Three times a day (TID) | ORAL | 0 refills | Status: DC | PRN

## 2017-03-07 NOTE — Progress Notes (Signed)
Pre visit review using our clinic review tool, if applicable. No additional management support is needed unless otherwise documented below in the visit note. 

## 2017-03-07 NOTE — Patient Instructions (Signed)
Ibuprofen with food  Heat  Let me know if not better and we will consider prednisone or muscle relaxant    Muscle Cramps and Spasms Muscle cramps and spasms occur when a muscle or muscles tighten and you have no control over this tightening (involuntary muscle contraction). They are a common problem and can develop in any muscle. The most common place is in the calf muscles of the leg. Muscle cramps and muscle spasms are both involuntary muscle contractions, but there are some differences between the two:  Muscle cramps are painful. They come and go and may last a few seconds to 15 minutes. Muscle cramps are often more forceful and last longer than muscle spasms.  Muscle spasms may or may not be painful. They may also last just a few seconds or much longer. Certain medical conditions, such as diabetes or Parkinson disease, can make it more likely to develop cramps or spasms. However, cramps or spasms are usually not caused by a serious underlying problem. Common causes include:  Overexertion.  Overuse from repetitive motions, or doing the same thing over and over.  Remaining in a certain position for a long period of time.  Improper preparation, form, or technique while playing a sport or doing an activity.  Dehydration.  Injury.  Side effects of some medicines.  Abnormally low levels of the salts and ions in your blood (electrolytes), especially potassium and calcium. This could happen if you are taking water pills (diuretics) or if you are pregnant. In many cases, the cause of muscle cramps or spasms is unknown. Follow these instructions at home:  Stay well hydrated. Drink enough fluid to keep your urine clear or pale yellow.  Try massaging, stretching, and relaxing the affected muscle.  If directed, apply heat to tight or tense muscles as often as told by your health care provider. Use the heat source that your health care provider recommends, such as a moist heat pack or a  heating pad.  Place a towel between your skin and the heat source.  Leave the heat on for 20-30 minutes.  Remove the heat if your skin turns bright red. This is especially important if you are unable to feel pain, heat, or cold. You may have a greater risk of getting burned.  If directed, put ice on the affected area. This may help if you are sore or have pain after a cramp or spasm.  Put ice in a plastic bag.  Place a towel between your skin and the bag.  Leavethe ice on for 20 minutes, 2-3 times a day.  Take over-the-counter and prescription medicines only as told by your health care provider.  Pay attention to any changes in your symptoms. Contact a health care provider if:  Your cramps or spasms get more severe or happen more often.  Your cramps or spasms do not improve over time. This information is not intended to replace advice given to you by your health care provider. Make sure you discuss any questions you have with your health care provider. Document Released: 04/08/2002 Document Revised: 11/18/2015 Document Reviewed: 07/21/2015 Elsevier Interactive Patient Education  2017 [employer].

## 2017-03-07 NOTE — Progress Notes (Signed)
Subjective:    Patient ID: Denise Barry, female    DOB: Nov 25, 1933, 83 y.o.   MRN: [id number]  CC: [name] is a 83 y.o. female who presents today for an acute visit.    HPI: CC: chronic low back pain however 2 weeks ago was cleaning closest with a lot of 'bending' and started having 'back spasms', unchanged. Has been doing heat/ice, tyleonol with some relief. Hasn't had back issues for 4 years until this episode.   No low back pain today. No numbness in legs. No dysuria, abdominal pain, fever, N, V.  No h/o GIB, CKD.   Worse when standing for long periods or sitting still, feels better when rocking in rocking chair.   Follows with PT  Has had lumbar fusion; h/o hip fracture  Lives alone.      DG lumbar 56 old l1 compression fracture,  Osteopenia Stable advanced spondylosis   HISTORY:  Past Medical History:  Diagnosis Date  . Hypercholesterolemia   . Hypertension   . Osteoarthritis   . Urinary incontinence    pessary in place  . Varicose veins    H/O   Past Surgical History:  Procedure Laterality Date  . BACK SURGERY  09/16/13  . DILATION AND CURETTAGE OF UTERUS  1971  . JOINT REPLACEMENT    . sclerosis  2000  . TUBAL LIGATION  1972   Family History  Problem Relation Age of Onset  . Heart disease Mother   . Diabetes Mother   . Hypertension Mother   . Heart disease Father   . Stroke Father   . Breast cancer Neg Hx     Allergies: Codeine; Cyclobenzaprine; Methocarbamol; Carbamazepine; and Gabapentin Current Outpatient Prescriptions on File Prior to Visit  Medication Sig Dispense Refill  . Acetaminophen (TYLENOL EXTRA STRENGTH PO) Take 1-2 tablets by mouth daily.    Marland Kitchen aspirin 81 MG tablet Take 81 mg by mouth daily.    . Eslicarbazepine Acetate (APTIOM PO) Take 1 tablet by mouth 2 (two) times daily.    . fexofenadine (ALLEGRA) 180 MG tablet Take 180 mg by mouth as needed for allergies or rhinitis.    Marland Kitchen lisinopril (PRINIVIL,ZESTRIL) 40  MG tablet Take 1 tablet (40 mg total) by mouth daily. 30 tablet 0  . Multiple Vitamins-Minerals (CENTRUM SILVER ULTRA WOMENS PO) Take by mouth.     No current facility-administered medications on file prior to visit.     Social History  Substance Use Topics  . Smoking status: Former Research scientist (life sciences)  . Smokeless tobacco: Never Used  . Alcohol use No    Review of Systems  Constitutional: Negative for chills and fever.  Respiratory: Negative for cough.   Cardiovascular: Negative for chest pain and palpitations.  Gastrointestinal: Negative for nausea and vomiting.  Musculoskeletal: Positive for back pain.  Neurological: Negative for dizziness, weakness, numbness and headaches.      Objective:    BP 120/76   Pulse 83   Temp 97.8 F (36.6 C) (Oral)   Ht 5\' 8"  (1.727 m)   Wt 155 lb 9.6 oz (70.6 kg)   SpO2 93%   BMI 23.66 kg/m    Physical Exam  Constitutional: She appears well-developed and well-nourished.  Eyes: Conjunctivae are normal.  Cardiovascular: Normal rate, regular rhythm, normal heart sounds and normal pulses.   Pulmonary/Chest: Effort normal and breath sounds normal. She has no wheezes. She has no rhonchi. She has no rales.  Abdominal: There is no CVA tenderness.  Musculoskeletal:  Lumbar back: She exhibits normal range of motion, no tenderness, no bony tenderness, no swelling, no edema, no pain and no spasm.       Arms: Full range of motion with flexion, tension, lateral side bends. No bony tenderness. No pain, numbness, tingling elicited with single leg raise bilaterally.   Pain as spasm as reported by patient marked on diagram.     Neurological: She is alert. She has normal strength. No sensory deficit.  Reflex Scores:      Patellar reflexes are 2+ on the right side and 2+ on the left side. Sensation and strength intact bilateral lower extremities.  Skin: Skin is warm and dry.  Psychiatric: She has a normal mood and affect. Her speech is normal and behavior  is normal. Thought content normal.  Vitals reviewed.      Assessment & Plan:  1. Muscle spasm Known to have degenerative disc disease of lumbar spine. Also known compression fracture. Suspect these are contributory to pain however cleaning out closet has exacerbated  muscle strain, spasm. Jointly agreed trial of NSAID . Concern for living alone with muscle relaxant. Encouraged heat, gentle stretching. Return precautions given.  - ibuprofen (ADVIL,MOTRIN) 600 MG tablet; Take 1 tablet (600 mg total) by mouth every 8 (eight) hours as needed.  Dispense: 30 tablet; Refill: 0     I am having Ms. Chaviano maintain her Multiple Vitamins-Minerals (CENTRUM SILVER ULTRA WOMENS PO), aspirin, fexofenadine, Acetaminophen (TYLENOL EXTRA STRENGTH PO), Eslicarbazepine Acetate (APTIOM PO), lisinopril, and SHINGRIX.   Meds ordered this encounter  Medications  . SHINGRIX injection    Return precautions given.   Risks, benefits, and alternatives of the medications and treatment plan prescribed today were discussed, and patient expressed understanding.   Education regarding symptom management and diagnosis given to patient on AVS.  Continue to follow with Einar Pheasant, MD for routine health maintenance.   Denise Barry and I agreed with plan.   Mable Paris, FNP

## 2017-03-08 ENCOUNTER — Telehealth: Payer: Self-pay

## 2017-03-08 DIAGNOSIS — M545 Low back pain: Principal | ICD-10-CM

## 2017-03-08 DIAGNOSIS — G8929 Other chronic pain: Secondary | ICD-10-CM

## 2017-03-08 MED ORDER — PREDNISONE 10 MG PO TABS: ORAL_TABLET | ORAL | 0 refills | Status: DC

## 2017-03-08 NOTE — Telephone Encounter (Signed)
Tried calling patient voicemail not setup  

## 2017-03-08 NOTE — Addendum Note (Signed)
Addended by: Burnard Hawthorne on: 03/08/2017 08:24 AM   Modules accepted: Orders

## 2017-03-08 NOTE — Telephone Encounter (Signed)
Call pt  We have faxed prednisone to [facility]  If no improvement, please let meknow and we will consult ortho

## 2017-03-08 NOTE — Telephone Encounter (Signed)
Patient is having very bad spasms this morning. Patient had to take a Norco for the px and that has not helped at all for pain. She also asked if Joycelyn Schmid would send in prednisone for the pain? Please advise.

## 2017-03-08 NOTE — Telephone Encounter (Signed)
Patient advised of below and verbalized understanding.  

## 2017-03-23 ENCOUNTER — Ambulatory Visit (INDEPENDENT_AMBULATORY_CARE_PROVIDER_SITE_OTHER): Payer: Medicare Other

## 2017-03-23 ENCOUNTER — Ambulatory Visit (INDEPENDENT_AMBULATORY_CARE_PROVIDER_SITE_OTHER): Payer: Medicare Other | Admitting: Internal Medicine

## 2017-03-23 ENCOUNTER — Other Ambulatory Visit: Payer: Self-pay | Admitting: Internal Medicine

## 2017-03-23 ENCOUNTER — Encounter: Payer: Self-pay | Admitting: Internal Medicine

## 2017-03-23 VITALS — BP 120/74 | HR 71 | Temp 98.6°F | Resp 12 | Ht 68.0 in | Wt 161.2 lb

## 2017-03-23 DIAGNOSIS — E7800 Pure hypercholesterolemia, unspecified: Secondary | ICD-10-CM | POA: Diagnosis not present

## 2017-03-23 DIAGNOSIS — I10 Essential (primary) hypertension: Secondary | ICD-10-CM

## 2017-03-23 DIAGNOSIS — M545 Low back pain, unspecified: Secondary | ICD-10-CM

## 2017-03-23 DIAGNOSIS — E871 Hypo-osmolality and hyponatremia: Secondary | ICD-10-CM | POA: Diagnosis not present

## 2017-03-23 DIAGNOSIS — G500 Trigeminal neuralgia: Secondary | ICD-10-CM

## 2017-03-23 LAB — BASIC METABOLIC PANEL
BUN: 16 mg/dL (ref 6–23)
CHLORIDE: 98 meq/L (ref 96–112)
CO2: 31 mEq/L (ref 19–32)
CREATININE: 0.95 mg/dL (ref 0.40–1.20)
Calcium: 9.4 mg/dL (ref 8.4–10.5)
GFR: 60.04 mL/min (ref >60.00)
GLUCOSE: 106 mg/dL — AB (ref 70–99)
POTASSIUM: 4.5 meq/L (ref 3.5–5.1)
Sodium: 132 mEq/L — ABNORMAL LOW (ref 135–145)

## 2017-03-23 NOTE — Progress Notes (Signed)
Pre-visit discussion using our clinic review tool. No additional management support is needed unless otherwise documented below in the visit note.  

## 2017-03-23 NOTE — Progress Notes (Signed)
Patient ID: Denise Barry, female   DOB: February 20, 1934, 83 y.o.   MRN: [id number]   Subjective:    Patient ID: Denise Barry, female    DOB: 29-Apr-1934, 83 y.o.   MRN: [id number]  HPI  Patient here for a scheduled follow up.  She has been having issues with her blood pressure.  Lisinopril was increased to 40mg  q day.   Blood pressures recently averaging 130-140s/60-70s.  Improved.  No chest pain.  No sob.  No acid reflux.  No abdominal pain.  Bowels moving.  Overall she feels she is doing well.  Is having some persistent back pain.     Past Medical History:  Diagnosis Date  . Hypercholesterolemia   . Hypertension   . Osteoarthritis   . Urinary incontinence    pessary in place  . Varicose veins    H/O   Past Surgical History:  Procedure Laterality Date  . BACK SURGERY  09/16/13  . DILATION AND CURETTAGE OF UTERUS  1971  . JOINT REPLACEMENT    . sclerosis  2000  . TUBAL LIGATION  1972   Family History  Problem Relation Age of Onset  . Heart disease Mother   . Diabetes Mother   . Hypertension Mother   . Heart disease Father   . Stroke Father   . Breast cancer Neg Hx    Social History   Social History  . Marital status: Widowed    Spouse name: N/A  . Number of children: N/A  . Years of education: N/A   Social History Main Topics  . Smoking status: Former Research scientist (life sciences)  . Smokeless tobacco: Never Used  . Alcohol use No  . Drug use: No  . Sexual activity: No   Other Topics Concern  . None   Social History Narrative  . None    Outpatient Encounter Prescriptions as of 03/23/2017  Medication Sig  . Acetaminophen (TYLENOL EXTRA STRENGTH PO) Take 1-2 tablets by mouth daily.  Marland Kitchen aspirin 81 MG tablet Take 81 mg by mouth daily.  . Eslicarbazepine Acetate (APTIOM PO) Take 1 tablet by mouth 2 (two) times daily.  . fexofenadine (ALLEGRA) 180 MG tablet Take 180 mg by mouth as needed for allergies or rhinitis.  Marland Kitchen ibuprofen (ADVIL,MOTRIN) 600 MG tablet Take 1 tablet  (600 mg total) by mouth every 8 (eight) hours as needed.  Marland Kitchen lisinopril (PRINIVIL,ZESTRIL) 40 MG tablet Take 1 tablet (40 mg total) by mouth daily.  . Multiple Vitamins-Minerals (CENTRUM SILVER ULTRA WOMENS PO) Take by mouth.  . [DISCONTINUED] predniSONE (DELTASONE) 10 MG tablet Take 4 tablets ( total 40 mg) by mouth for 2 days; take 3 tablets ( total 30 mg) by mouth for 2 days; take 2 tablets ( total 20 mg) by mouth for 1 day; take 1 tablet ( total 10 mg) by mouth for 1 day.  . [DISCONTINUED] SHINGRIX injection    No facility-administered encounter medications on file as of 03/23/2017.     Review of Systems  Constitutional: Negative for appetite change and unexpected weight change.  HENT: Negative for congestion and sinus pressure.   Respiratory: Negative for cough, chest tightness and shortness of breath.   Cardiovascular: Negative for chest pain, palpitations and leg swelling.  Gastrointestinal: Negative for abdominal pain, diarrhea, nausea and vomiting.  Genitourinary: Negative for difficulty urinating and dysuria.  Musculoskeletal: Positive for back pain. Negative for joint swelling and myalgias.  Skin: Negative for color change and rash.  Neurological: Negative for dizziness, light-headedness and  headaches.  Psychiatric/Behavioral: Negative for agitation and dysphoric mood.       Objective:    Physical Exam  Constitutional: She appears well-developed and well-nourished. No distress.  HENT:  Nose: Nose normal.  Mouth/Throat: Oropharynx is clear and moist.  Neck: Neck supple. No thyromegaly present.  Cardiovascular: Normal rate and regular rhythm.   Pulmonary/Chest: Breath sounds normal. No respiratory distress. She has no wheezes.  Abdominal: Soft. Bowel sounds are normal. There is no tenderness.  Musculoskeletal: She exhibits no edema or tenderness.  Lymphadenopathy:    She has no cervical adenopathy.  Skin: No rash noted. No erythema.  Psychiatric: She has a normal mood and  affect. Her behavior is normal.    BP 120/74 (BP Location: Left Arm, Patient Position: Sitting, Cuff Size: Large)   Pulse 71   Temp 98.6 F (37 C) (Oral)   Resp 12   Ht 5\' 8"  (1.727 m)   Wt 161 lb 3.2 oz (73.1 kg)   SpO2 98%   BMI 24.51 kg/m  Wt Readings from Last 3 Encounters:  03/23/17 161 lb 3.2 oz (73.1 kg)  03/07/17 155 lb 9.6 oz (70.6 kg)  02/13/17 158 lb 9.6 oz (71.9 kg)     Lab Results  Component Value Date   WBC 9.8 02/22/2017   HGB 12.6 02/22/2017   HCT 38.0 02/22/2017   PLT 301.0 02/22/2017   GLUCOSE 106 (H) 03/23/2017   CHOL 205 (H) 02/22/2017   TRIG 94.0 02/22/2017   HDL 71.30 02/22/2017   LDLCALC 115 (H) 02/22/2017   ALT 10 02/22/2017   AST 15 02/22/2017   NA 132 (L) 03/23/2017   K 4.5 03/23/2017   CL 98 03/23/2017   CREATININE 0.95 03/23/2017   BUN 16 03/23/2017   CO2 31 03/23/2017   TSH 5.10 (H) 02/22/2017   INR 1.0 02/03/2015    Mm Digital Screening Bilateral  Result Date: 01/11/2017 CLINICAL DATA:  Screening. EXAM: DIGITAL SCREENING BILATERAL MAMMOGRAM WITH CAD COMPARISON:  Previous exam(s). ACR Breast Density Category b: There are scattered areas of fibroglandular density. FINDINGS: There are no findings suspicious for malignancy. Images were processed with CAD. IMPRESSION: No mammographic evidence of malignancy. A result letter of this screening mammogram will be mailed directly to the patient. RECOMMENDATION: Screening mammogram in one year. (Code:SM-B-01Y) BI-RADS CATEGORY  1: Negative. Electronically Signed   By: Lillia Mountain M.D.   On: 01/11/2017 10:17       Assessment & Plan:   Problem List Items Addressed This Visit    Back pain    Is s/p back surgery previously.  Some persistent pain.  Will check xray.       Relevant Orders   DG Lumbar Spine 2-3 Views (Completed)   Essential hypertension, benign - Primary    Blood pressure overall improved.  On my check today 378 systolic reading.  Continue same medication regimen.  Follow  pressures.  Follow metabolic panel.        Relevant Orders   Basic metabolic panel (Completed)   Hypercholesterolemia    Low cholesterol diet and exercise.  Follow lipid panel.        Hyponatremia    Slightly decreased sodium.  Recheck sodium level.        Trigeminal neuralgia    On aptiom.  Doing well.  Follow.            Einar Pheasant, MD

## 2017-03-23 NOTE — Progress Notes (Signed)
Order placed for referral to physical therapy.  

## 2017-03-24 ENCOUNTER — Telehealth: Payer: Self-pay | Admitting: Internal Medicine

## 2017-03-24 ENCOUNTER — Other Ambulatory Visit: Payer: Self-pay | Admitting: Internal Medicine

## 2017-03-24 ENCOUNTER — Telehealth: Payer: Self-pay | Admitting: *Deleted

## 2017-03-24 DIAGNOSIS — R7989 Other specified abnormal findings of blood chemistry: Secondary | ICD-10-CM

## 2017-03-24 DIAGNOSIS — E871 Hypo-osmolality and hyponatremia: Secondary | ICD-10-CM

## 2017-03-24 NOTE — Telephone Encounter (Signed)
Opened in error

## 2017-03-24 NOTE — Telephone Encounter (Signed)
Pt called and is requesting lab and xray results. Pt has an appt at 9:30 in [place]. Please advise, thank you!  Call pt @ [phone]

## 2017-03-24 NOTE — Progress Notes (Signed)
Order placed for f/u tsh.  

## 2017-03-24 NOTE — Telephone Encounter (Signed)
Patient requested lab and XRay results . Pt requested to be called later in the morning.   Pt contact [phone]

## 2017-03-24 NOTE — Progress Notes (Signed)
Order placed for f/u sodium check.   

## 2017-03-24 NOTE — Telephone Encounter (Signed)
See results note called patient and l/m to call office

## 2017-03-26 ENCOUNTER — Encounter: Payer: Self-pay | Admitting: Internal Medicine

## 2017-03-26 NOTE — Assessment & Plan Note (Signed)
On aptiom.  Doing well.  Follow.

## 2017-03-26 NOTE — Assessment & Plan Note (Signed)
Low cholesterol diet and exercise.  Follow lipid panel.   

## 2017-03-26 NOTE — Assessment & Plan Note (Signed)
Blood pressure overall improved.  On my check today 797 systolic reading.  Continue same medication regimen.  Follow pressures.  Follow metabolic panel.

## 2017-03-26 NOTE — Assessment & Plan Note (Signed)
Slightly decreased sodium.  Recheck sodium level.

## 2017-03-26 NOTE — Assessment & Plan Note (Signed)
Is s/p back surgery previously.  Some persistent pain.  Will check xray.

## 2017-03-28 NOTE — Telephone Encounter (Signed)
Pt lvm returning Joellen's call. Please cb [phone]

## 2017-04-06 ENCOUNTER — Telehealth: Payer: Self-pay | Admitting: Radiology

## 2017-04-06 ENCOUNTER — Other Ambulatory Visit (INDEPENDENT_AMBULATORY_CARE_PROVIDER_SITE_OTHER): Payer: Medicare Other

## 2017-04-06 ENCOUNTER — Other Ambulatory Visit: Payer: Self-pay | Admitting: Internal Medicine

## 2017-04-06 DIAGNOSIS — R946 Abnormal results of thyroid function studies: Secondary | ICD-10-CM

## 2017-04-06 DIAGNOSIS — R7989 Other specified abnormal findings of blood chemistry: Secondary | ICD-10-CM

## 2017-04-06 DIAGNOSIS — E871 Hypo-osmolality and hyponatremia: Secondary | ICD-10-CM

## 2017-04-06 LAB — SODIUM: Sodium: 135 mEq/L (ref 135–145)

## 2017-04-06 LAB — TSH: TSH: 3.2 u[IU]/mL (ref 0.35–4.50)

## 2017-04-06 NOTE — Telephone Encounter (Signed)
Order placed for f/u potassium.  

## 2017-04-06 NOTE — Telephone Encounter (Signed)
Pt came in for labs today for sodium and tsh and wanted to know if she could have her potassium drawn as well?

## 2017-04-06 NOTE — Progress Notes (Unsigned)
Order placed for f/u labs.  

## 2017-04-07 ENCOUNTER — Other Ambulatory Visit (INDEPENDENT_AMBULATORY_CARE_PROVIDER_SITE_OTHER): Payer: Medicare Other

## 2017-04-07 ENCOUNTER — Encounter: Payer: Self-pay | Admitting: Internal Medicine

## 2017-04-07 DIAGNOSIS — R262 Difficulty in walking, not elsewhere classified: Secondary | ICD-10-CM | POA: Diagnosis not present

## 2017-04-07 DIAGNOSIS — E871 Hypo-osmolality and hyponatremia: Secondary | ICD-10-CM | POA: Diagnosis not present

## 2017-04-07 DIAGNOSIS — M545 Low back pain: Secondary | ICD-10-CM | POA: Diagnosis not present

## 2017-04-07 LAB — POTASSIUM: POTASSIUM: 4.4 meq/L (ref 3.5–5.1)

## 2017-04-08 ENCOUNTER — Encounter: Payer: Self-pay | Admitting: Internal Medicine

## 2017-04-10 DIAGNOSIS — R262 Difficulty in walking, not elsewhere classified: Secondary | ICD-10-CM | POA: Diagnosis not present

## 2017-04-10 DIAGNOSIS — M545 Low back pain: Secondary | ICD-10-CM | POA: Diagnosis not present

## 2017-04-12 DIAGNOSIS — M545 Low back pain: Secondary | ICD-10-CM | POA: Diagnosis not present

## 2017-04-12 DIAGNOSIS — R262 Difficulty in walking, not elsewhere classified: Secondary | ICD-10-CM | POA: Diagnosis not present

## 2017-04-13 NOTE — Telephone Encounter (Signed)
Hard copy mailed  

## 2017-04-18 DIAGNOSIS — M545 Low back pain: Secondary | ICD-10-CM | POA: Diagnosis not present

## 2017-04-18 DIAGNOSIS — R262 Difficulty in walking, not elsewhere classified: Secondary | ICD-10-CM | POA: Diagnosis not present

## 2017-04-21 DIAGNOSIS — M545 Low back pain: Secondary | ICD-10-CM | POA: Diagnosis not present

## 2017-04-21 DIAGNOSIS — R262 Difficulty in walking, not elsewhere classified: Secondary | ICD-10-CM | POA: Diagnosis not present

## 2017-04-24 DIAGNOSIS — M545 Low back pain: Secondary | ICD-10-CM | POA: Diagnosis not present

## 2017-04-24 DIAGNOSIS — R262 Difficulty in walking, not elsewhere classified: Secondary | ICD-10-CM | POA: Diagnosis not present

## 2017-04-27 DIAGNOSIS — M545 Low back pain: Secondary | ICD-10-CM | POA: Diagnosis not present

## 2017-04-27 DIAGNOSIS — R262 Difficulty in walking, not elsewhere classified: Secondary | ICD-10-CM | POA: Diagnosis not present

## 2017-05-01 DIAGNOSIS — M545 Low back pain: Secondary | ICD-10-CM | POA: Diagnosis not present

## 2017-05-01 DIAGNOSIS — R262 Difficulty in walking, not elsewhere classified: Secondary | ICD-10-CM | POA: Diagnosis not present

## 2017-05-04 ENCOUNTER — Encounter: Payer: Self-pay | Admitting: Internal Medicine

## 2017-05-04 ENCOUNTER — Ambulatory Visit (INDEPENDENT_AMBULATORY_CARE_PROVIDER_SITE_OTHER): Payer: Medicare Other | Admitting: Internal Medicine

## 2017-05-04 DIAGNOSIS — I10 Essential (primary) hypertension: Secondary | ICD-10-CM

## 2017-05-04 DIAGNOSIS — E7800 Pure hypercholesterolemia, unspecified: Secondary | ICD-10-CM | POA: Diagnosis not present

## 2017-05-04 DIAGNOSIS — M545 Low back pain, unspecified: Secondary | ICD-10-CM

## 2017-05-04 DIAGNOSIS — R262 Difficulty in walking, not elsewhere classified: Secondary | ICD-10-CM | POA: Diagnosis not present

## 2017-05-04 DIAGNOSIS — G500 Trigeminal neuralgia: Secondary | ICD-10-CM | POA: Diagnosis not present

## 2017-05-04 MED ORDER — AMLODIPINE BESYLATE 2.5 MG PO TABS: 2.5000 mg | ORAL_TABLET | Freq: Every day | ORAL | 2 refills | Status: DC

## 2017-05-04 NOTE — Progress Notes (Signed)
Pre-visit discussion using our clinic review tool. No additional management support is needed unless otherwise documented below in the visit note.  

## 2017-05-04 NOTE — Patient Instructions (Signed)
Start amlodipine 2.5mg  per day.

## 2017-05-04 NOTE — Progress Notes (Signed)
Patient ID: Denise Barry, female   DOB: August 07, 1934, 83 y.o.   MRN: [id number]   Subjective:    Patient ID: Denise Barry, female    DOB: 1934/07/02, 83 y.o.   MRN: [id number]  HPI  Patient here for a scheduled follow up.  She had some recent issues with low back pain.  Saw Mable Paris.  Note reviewed.  Instructed to take tylenol.  Pain persisted.  Had some left over prednisone.  This helped.  She is also going to [facility] physical therapy.  TENS.  Helped.  Pain better now.  No chest pain.  No sob.  No acid reflux.  No abdominal pain.  Bowels moving.  Outside blood pressure readings reviewed.  Blood pressures averaging 130-140s/70-80s.  Tries to stay active.  Overall feels she is doing well.     Past Medical History:  Diagnosis Date  . Hypercholesterolemia   . Hypertension   . Osteoarthritis   . Urinary incontinence    pessary in place  . Varicose veins    H/O   Past Surgical History:  Procedure Laterality Date  . BACK SURGERY  09/16/13  . DILATION AND CURETTAGE OF UTERUS  1971  . JOINT REPLACEMENT    . sclerosis  2000  . TUBAL LIGATION  1972   Family History  Problem Relation Age of Onset  . Heart disease Mother   . Diabetes Mother   . Hypertension Mother   . Heart disease Father   . Stroke Father   . Breast cancer Neg Hx    Social History   Social History  . Marital status: Widowed    Spouse name: N/A  . Number of children: N/A  . Years of education: N/A   Social History Main Topics  . Smoking status: Former Research scientist (life sciences)  . Smokeless tobacco: Never Used  . Alcohol use No  . Drug use: No  . Sexual activity: No   Other Topics Concern  . None   Social History Narrative  . None    Outpatient Encounter Prescriptions as of 05/04/2017  Medication Sig  . Acetaminophen (TYLENOL EXTRA STRENGTH PO) Take 1-2 tablets by mouth daily.  Marland Kitchen aspirin 81 MG tablet Take 81 mg by mouth daily.  . Eslicarbazepine Acetate (APTIOM PO) Take 1 tablet by mouth 2 (two)  times daily.  . fexofenadine (ALLEGRA) 180 MG tablet Take 180 mg by mouth as needed for allergies or rhinitis.  Marland Kitchen ibuprofen (ADVIL,MOTRIN) 600 MG tablet Take 1 tablet (600 mg total) by mouth every 8 (eight) hours as needed.  Marland Kitchen lisinopril (PRINIVIL,ZESTRIL) 40 MG tablet Take 1 tablet (40 mg total) by mouth daily.  . Multiple Vitamins-Minerals (CENTRUM SILVER ULTRA WOMENS PO) Take by mouth.  Marland Kitchen amLODipine (NORVASC) 2.5 MG tablet Take 1 tablet (2.5 mg total) by mouth daily.   No facility-administered encounter medications on file as of 05/04/2017.     Review of Systems  Constitutional: Negative for appetite change and unexpected weight change.  HENT: Negative for congestion and sinus pressure.   Respiratory: Negative for cough, chest tightness and shortness of breath.   Cardiovascular: Negative for chest pain, palpitations and leg swelling.  Gastrointestinal: Negative for abdominal pain, diarrhea, nausea and vomiting.  Genitourinary: Negative for difficulty urinating and dysuria.  Musculoskeletal: Positive for back pain. Negative for joint swelling.  Skin: Negative for color change and rash.  Neurological: Negative for dizziness, light-headedness and headaches.  Psychiatric/Behavioral: Negative for agitation and dysphoric mood.       Objective:  Physical Exam  Constitutional: She appears well-developed and well-nourished. No distress.  HENT:  Nose: Nose normal.  Mouth/Throat: Oropharynx is clear and moist.  Neck: Neck supple. No thyromegaly present.  Cardiovascular: Normal rate and regular rhythm.   Pulmonary/Chest: Breath sounds normal. No respiratory distress. She has no wheezes.  Abdominal: Soft. Bowel sounds are normal. There is no tenderness.  Musculoskeletal: She exhibits no edema or tenderness.  Lymphadenopathy:    She has no cervical adenopathy.  Skin: No rash noted. No erythema.  Psychiatric: She has a normal mood and affect. Her behavior is normal.    BP 136/72 (BP  Location: Left Arm, Patient Position: Sitting, Cuff Size: Normal)   Pulse 78   Temp 98.7 F (37.1 C) (Oral)   Resp 12   Ht 5\' 8"  (1.727 m)   Wt 161 lb (73 kg)   SpO2 98%   BMI 24.48 kg/m  Wt Readings from Last 3 Encounters:  05/04/17 161 lb (73 kg)  03/23/17 161 lb 3.2 oz (73.1 kg)  03/07/17 155 lb 9.6 oz (70.6 kg)     Lab Results  Component Value Date   WBC 9.8 02/22/2017   HGB 12.6 02/22/2017   HCT 38.0 02/22/2017   PLT 301.0 02/22/2017   GLUCOSE 106 (H) 03/23/2017   CHOL 205 (H) 02/22/2017   TRIG 94.0 02/22/2017   HDL 71.30 02/22/2017   LDLCALC 115 (H) 02/22/2017   ALT 10 02/22/2017   AST 15 02/22/2017   NA 135 04/06/2017   K 4.4 04/07/2017   CL 98 03/23/2017   CREATININE 0.95 03/23/2017   BUN 16 03/23/2017   CO2 31 03/23/2017   TSH 3.20 04/06/2017   INR 1.0 02/03/2015    Mm Digital Screening Bilateral  Result Date: 01/11/2017 CLINICAL DATA:  Screening. EXAM: DIGITAL SCREENING BILATERAL MAMMOGRAM WITH CAD COMPARISON:  Previous exam(s). ACR Breast Density Category b: There are scattered areas of fibroglandular density. FINDINGS: There are no findings suspicious for malignancy. Images were processed with CAD. IMPRESSION: No mammographic evidence of malignancy. A result letter of this screening mammogram will be mailed directly to the patient. RECOMMENDATION: Screening mammogram in one year. (Code:SM-B-01Y) BI-RADS CATEGORY  1: Negative. Electronically Signed   By: Lillia Mountain M.D.   On: 01/11/2017 10:17       Assessment & Plan:   Problem List Items Addressed This Visit    Back pain    S/p back surgery.  Going to therapy.  Helping.  Back better.  Follow.        Essential hypertension, benign    Outside blood pressure checks as outlined.  Recheck here elevated.  Start amlodipine 2.5mg  q day.  Follow pressures.  Follow metabolic panel.  Schedule f/u to reassess blood pressure.        Relevant Medications   amLODipine (NORVASC) 2.5 MG tablet    Hypercholesterolemia    Low cholesterol diet and exercise.  Follow lipid panel.        Relevant Medications   amLODipine (NORVASC) 2.5 MG tablet   Trigeminal neuralgia    On aptiom.  Stable.  Follow.           Einar Pheasant, MD

## 2017-05-06 ENCOUNTER — Encounter: Payer: Self-pay | Admitting: Internal Medicine

## 2017-05-06 NOTE — Assessment & Plan Note (Signed)
On aptiom.  Stable.  Follow.

## 2017-05-06 NOTE — Assessment & Plan Note (Signed)
Outside blood pressure checks as outlined.  Recheck here elevated.  Start amlodipine 2.5mg  q day.  Follow pressures.  Follow metabolic panel.  Schedule f/u to reassess blood pressure.

## 2017-05-06 NOTE — Assessment & Plan Note (Signed)
S/p back surgery.  Going to therapy.  Helping.  Back better.  Follow.

## 2017-05-06 NOTE — Assessment & Plan Note (Signed)
Low cholesterol diet and exercise.  Follow lipid panel.   

## 2017-05-08 DIAGNOSIS — R262 Difficulty in walking, not elsewhere classified: Secondary | ICD-10-CM | POA: Diagnosis not present

## 2017-05-08 DIAGNOSIS — M545 Low back pain: Secondary | ICD-10-CM | POA: Diagnosis not present

## 2017-05-10 DIAGNOSIS — R262 Difficulty in walking, not elsewhere classified: Secondary | ICD-10-CM | POA: Diagnosis not present

## 2017-05-10 DIAGNOSIS — M545 Low back pain: Secondary | ICD-10-CM | POA: Diagnosis not present

## 2017-05-15 ENCOUNTER — Telehealth: Payer: Self-pay | Admitting: *Deleted

## 2017-05-15 DIAGNOSIS — R262 Difficulty in walking, not elsewhere classified: Secondary | ICD-10-CM | POA: Diagnosis not present

## 2017-05-15 DIAGNOSIS — M545 Low back pain: Secondary | ICD-10-CM | POA: Diagnosis not present

## 2017-05-15 NOTE — Telephone Encounter (Signed)
Pt returned call requesting a update prior to closing

## 2017-05-15 NOTE — Telephone Encounter (Signed)
Patient currently has muscular back pain. She stated she has no injury to the back. The pain is unpredictable, pt requested to have a medication to help with this. Pt contact [phone]   Pharmacy [facility]

## 2017-05-16 NOTE — Telephone Encounter (Signed)
Reason for call: back  Symptoms: spasms low back waist high Duration every two weeks, upon bending, applying heat to low back, going to physical therapy at TENS unit , spasms cause left leg to jerk thigh, worse  Medications: Aleve Last seen for this problem:05/04/17 Seen by: Dr Nicki Reaper

## 2017-05-16 NOTE — Telephone Encounter (Signed)
Please call patient and see if she is okay?

## 2017-05-16 NOTE — Telephone Encounter (Signed)
Appointment scheduled.

## 2017-05-17 DIAGNOSIS — R262 Difficulty in walking, not elsewhere classified: Secondary | ICD-10-CM | POA: Diagnosis not present

## 2017-05-17 DIAGNOSIS — M545 Low back pain: Secondary | ICD-10-CM | POA: Diagnosis not present

## 2017-05-18 ENCOUNTER — Ambulatory Visit (INDEPENDENT_AMBULATORY_CARE_PROVIDER_SITE_OTHER): Payer: Medicare Other | Admitting: Family

## 2017-05-18 ENCOUNTER — Encounter: Payer: Self-pay | Admitting: Family

## 2017-05-18 VITALS — BP 134/78 | HR 63 | Temp 98.7°F | Wt 158.5 lb

## 2017-05-18 DIAGNOSIS — M545 Low back pain, unspecified: Secondary | ICD-10-CM

## 2017-05-18 MED ORDER — LIDOCAINE 5 % EX PTCH: 1.0000 | MEDICATED_PATCH | CUTANEOUS | 1 refills | Status: DC

## 2017-05-18 MED ORDER — TRAMADOL HCL 50 MG PO TABS: 50.0000 mg | ORAL_TABLET | Freq: Three times a day (TID) | ORAL | 0 refills | Status: DC | PRN

## 2017-05-18 NOTE — Progress Notes (Signed)
Subjective:    Patient ID: Denise Barry, female    DOB: Mar 15, 1934, 83 y.o.   MRN: [id number]  CC: Denise Barry is a 83 y.o. female who presents today for an acute visit.    HPI: Reevaluation of low back pain, waxing and waning. Symptoms are unchanged from prior visit. Pain can get to be excruciating and would like something to take for "emergencies' especially at nighttime when she is unable to get comfortable.  Pain with standing , hard to find comfortable when sleeping. Short term relief from prednisone. NO numbness, tingling in legs.  No new injury.   prednisone in the past with short term relief.  Doing PT currently which is helping, especially the TENS unit.  NO h/o cancer. No new urinary incontinence. NO dysuria.   Lumbar surgery 4 years ago at [facility].     02/2017 lumbar xray- no acute abnormality  HISTORY:  Past Medical History:  Diagnosis Date  . Hypercholesterolemia   . Hypertension   . Osteoarthritis   . Urinary incontinence    pessary in place  . Varicose veins    H/O   Past Surgical History:  Procedure Laterality Date  . BACK SURGERY  09/16/13  . DILATION AND CURETTAGE OF UTERUS  1971  . JOINT REPLACEMENT    . sclerosis  2000  . TUBAL LIGATION  1972   Family History  Problem Relation Age of Onset  . Heart disease Mother   . Diabetes Mother   . Hypertension Mother   . Heart disease Father   . Stroke Father   . Breast cancer Neg Hx     Allergies: Codeine; Cyclobenzaprine; Methocarbamol; Carbamazepine; and Gabapentin Current Outpatient Prescriptions on File Prior to Visit  Medication Sig Dispense Refill  . Acetaminophen (TYLENOL EXTRA STRENGTH PO) Take 1-2 tablets by mouth daily.    Marland Kitchen amLODipine (NORVASC) 2.5 MG tablet Take 1 tablet (2.5 mg total) by mouth daily. 30 tablet 2  . aspirin 81 MG tablet Take 81 mg by mouth daily.    . Eslicarbazepine Acetate (APTIOM PO) Take 1 tablet by mouth 2 (two) times daily.    . fexofenadine  (ALLEGRA) 180 MG tablet Take 180 mg by mouth as needed for allergies or rhinitis.    Marland Kitchen ibuprofen (ADVIL,MOTRIN) 600 MG tablet Take 1 tablet (600 mg total) by mouth every 8 (eight) hours as needed. 30 tablet 0  . lisinopril (PRINIVIL,ZESTRIL) 40 MG tablet Take 1 tablet (40 mg total) by mouth daily. 30 tablet 0  . Multiple Vitamins-Minerals (CENTRUM SILVER ULTRA WOMENS PO) Take by mouth.     No current facility-administered medications on file prior to visit.     Social History  Substance Use Topics  . Smoking status: Former Research scientist (life sciences)  . Smokeless tobacco: Never Used  . Alcohol use No    Review of Systems  Constitutional: Negative for chills and fever.  Respiratory: Negative for cough.   Cardiovascular: Negative for chest pain and palpitations.  Gastrointestinal: Negative for nausea and vomiting.  Genitourinary: Negative for dysuria.  Musculoskeletal: Positive for back pain.  Neurological: Negative for weakness and numbness.      Objective:    BP 134/78   Pulse 63   Temp 98.7 F (37.1 C) (Oral)   Wt 158 lb 8 oz (71.9 kg)   SpO2 97%   BMI 24.10 kg/m    Physical Exam  Constitutional: She appears well-developed and well-nourished.  Eyes: Conjunctivae are normal.  Cardiovascular: Normal rate, regular rhythm,  normal heart sounds and normal pulses.   Pulmonary/Chest: Effort normal and breath sounds normal. She has no wheezes. She has no rhonchi. She has no rales.  Neurological: She is alert.  Skin: Skin is warm and dry.  Psychiatric: She has a normal mood and affect. Her speech is normal and behavior is normal. Thought content normal.  Vitals reviewed.      Assessment & Plan:   Problem List Items Addressed This Visit      Other   Back pain - Primary    Unchanged from prior. I agree with patient that  I would like her to have something to take if the pain gets severe. We discussed tramadol and the risks including drowsiness of this medication. Advised her since she lives  alone to trial medications one afternoon and let her children that she's doing so. This way she would know if she would feel comfortable taking it at night if pain were to worsen. In the meantime, patient will also call her [facility] to make follow-up appointment. Return precautions given.      Relevant Medications   traMADol (ULTRAM) 50 MG tablet   lidocaine (LIDODERM) 5 %   Other Relevant Orders   Ambulatory referral to Orthopedic Surgery        I am having [name] maintain her Multiple Vitamins-Minerals (CENTRUM SILVER ULTRA WOMENS PO), aspirin, fexofenadine, Acetaminophen (TYLENOL EXTRA STRENGTH PO), Eslicarbazepine Acetate (APTIOM PO), lisinopril, ibuprofen, and amLODipine.   No orders of the defined types were placed in this encounter.   Return precautions given.   Risks, benefits, and alternatives of the medications and treatment plan prescribed today were discussed, and patient expressed understanding.   Education regarding symptom management and diagnosis given to patient on AVS.  Continue to follow with Einar Pheasant, MD for routine health maintenance.   Denise Barry and I agreed with plan.   Mable Paris, FNP

## 2017-05-18 NOTE — Assessment & Plan Note (Signed)
Unchanged from prior. I agree with patient that  I would like her to have something to take if the pain gets severe. We discussed tramadol and the risks including drowsiness of this medication. Advised her since she lives alone to trial medications one afternoon and let her children that she's doing so. This way she would know if she would feel comfortable taking it at night if pain were to worsen. In the meantime, patient will also call her [facility] to make follow-up appointment. Return precautions given.

## 2017-05-18 NOTE — Patient Instructions (Addendum)
As discussed, trial run of tramadol one afternoon and let your children know. If too drowsy on medication, please call and let us know and we will prescribe something else.  If works, it is a good medication for severe pain.  Let us know if not better

## 2017-05-20 DIAGNOSIS — M62830 Muscle spasm of back: Secondary | ICD-10-CM | POA: Diagnosis not present

## 2017-05-20 DIAGNOSIS — M549 Dorsalgia, unspecified: Secondary | ICD-10-CM | POA: Diagnosis not present

## 2017-05-21 DIAGNOSIS — I10 Essential (primary) hypertension: Secondary | ICD-10-CM | POA: Diagnosis not present

## 2017-05-21 DIAGNOSIS — M8588 Other specified disorders of bone density and structure, other site: Secondary | ICD-10-CM | POA: Diagnosis not present

## 2017-05-21 DIAGNOSIS — N3000 Acute cystitis without hematuria: Secondary | ICD-10-CM | POA: Diagnosis not present

## 2017-05-21 DIAGNOSIS — Z79899 Other long term (current) drug therapy: Secondary | ICD-10-CM | POA: Diagnosis not present

## 2017-05-21 DIAGNOSIS — M47896 Other spondylosis, lumbar region: Secondary | ICD-10-CM | POA: Diagnosis not present

## 2017-05-21 DIAGNOSIS — Z96642 Presence of left artificial hip joint: Secondary | ICD-10-CM | POA: Diagnosis not present

## 2017-05-21 DIAGNOSIS — Z5181 Encounter for therapeutic drug level monitoring: Secondary | ICD-10-CM | POA: Diagnosis not present

## 2017-05-21 DIAGNOSIS — Z87891 Personal history of nicotine dependence: Secondary | ICD-10-CM | POA: Diagnosis not present

## 2017-05-21 DIAGNOSIS — M47816 Spondylosis without myelopathy or radiculopathy, lumbar region: Secondary | ICD-10-CM | POA: Diagnosis not present

## 2017-05-21 DIAGNOSIS — M5137 Other intervertebral disc degeneration, lumbosacral region: Secondary | ICD-10-CM | POA: Diagnosis not present

## 2017-05-21 DIAGNOSIS — M5442 Lumbago with sciatica, left side: Secondary | ICD-10-CM | POA: Diagnosis not present

## 2017-05-21 DIAGNOSIS — Z981 Arthrodesis status: Secondary | ICD-10-CM | POA: Diagnosis not present

## 2017-05-21 DIAGNOSIS — M5441 Lumbago with sciatica, right side: Secondary | ICD-10-CM | POA: Diagnosis not present

## 2017-05-22 DIAGNOSIS — R262 Difficulty in walking, not elsewhere classified: Secondary | ICD-10-CM | POA: Diagnosis not present

## 2017-05-22 DIAGNOSIS — M545 Low back pain: Secondary | ICD-10-CM | POA: Diagnosis not present

## 2017-05-23 ENCOUNTER — Telehealth: Payer: Self-pay | Admitting: Internal Medicine

## 2017-05-23 DIAGNOSIS — M545 Low back pain, unspecified: Secondary | ICD-10-CM

## 2017-05-23 NOTE — Telephone Encounter (Signed)
Pt called and stated that the lidocaine (LIDODERM) 5 %, she states that it did not help too much. She started by taking a 1/2 tablet Saturday night when she had a spasm, was not working wound up taking a whole and nothing. Pt states that she went the the ER at [facility] and then saw her physical therapist. Therapist thinks that it may be the rods from her back surgery and the muscles working against each other. Pt states that the tramadol works best. Please advise, thank you!  Call pt @ [phone]

## 2017-05-23 NOTE — Telephone Encounter (Signed)
Need clarification on her message.  States lidoderm patch does not work.  Then message states took 1/2 pill and whole pill.  (need to know what pill).  Please also confirm that pt has taken tramadol and tolerates.  Needs to see ortho.  It appears that ortho referral has been placed.

## 2017-05-23 NOTE — Telephone Encounter (Signed)
Please advise 

## 2017-05-24 DIAGNOSIS — R262 Difficulty in walking, not elsewhere classified: Secondary | ICD-10-CM | POA: Diagnosis not present

## 2017-05-24 DIAGNOSIS — M545 Low back pain: Secondary | ICD-10-CM | POA: Diagnosis not present

## 2017-05-24 MED ORDER — TRAMADOL HCL 50 MG PO TABS: 50.0000 mg | ORAL_TABLET | Freq: Two times a day (BID) | ORAL | 0 refills | Status: DC | PRN

## 2017-05-24 NOTE — Telephone Encounter (Signed)
rx ok'd for tramadol #30 with no refills.  

## 2017-05-24 NOTE — Telephone Encounter (Signed)
Patient informed faced to [facility].

## 2017-05-24 NOTE — Telephone Encounter (Signed)
Called patient states that she is taking the tramadol 50mg  bid and it is helping a lot. She has an app with [facility] on 7/27 but would like refill on tramadol until because it is helping for for at least a month.

## 2017-05-26 DIAGNOSIS — M4326 Fusion of spine, lumbar region: Secondary | ICD-10-CM | POA: Diagnosis not present

## 2017-05-29 DIAGNOSIS — M545 Low back pain: Secondary | ICD-10-CM | POA: Diagnosis not present

## 2017-05-29 DIAGNOSIS — R262 Difficulty in walking, not elsewhere classified: Secondary | ICD-10-CM | POA: Diagnosis not present

## 2017-05-31 ENCOUNTER — Other Ambulatory Visit: Payer: Self-pay

## 2017-05-31 DIAGNOSIS — R262 Difficulty in walking, not elsewhere classified: Secondary | ICD-10-CM | POA: Diagnosis not present

## 2017-05-31 DIAGNOSIS — M545 Low back pain: Secondary | ICD-10-CM | POA: Diagnosis not present

## 2017-05-31 MED ORDER — LISINOPRIL 40 MG PO TABS: 40.0000 mg | ORAL_TABLET | Freq: Every day | ORAL | 0 refills | Status: DC

## 2017-06-07 DIAGNOSIS — M545 Low back pain: Secondary | ICD-10-CM | POA: Diagnosis not present

## 2017-06-07 DIAGNOSIS — R262 Difficulty in walking, not elsewhere classified: Secondary | ICD-10-CM | POA: Diagnosis not present

## 2017-06-09 DIAGNOSIS — M810 Age-related osteoporosis without current pathological fracture: Secondary | ICD-10-CM | POA: Diagnosis present

## 2017-06-09 DIAGNOSIS — E222 Syndrome of inappropriate secretion of antidiuretic hormone: Secondary | ICD-10-CM | POA: Diagnosis not present

## 2017-06-09 DIAGNOSIS — G8929 Other chronic pain: Secondary | ICD-10-CM | POA: Diagnosis present

## 2017-06-09 DIAGNOSIS — R531 Weakness: Secondary | ICD-10-CM | POA: Diagnosis not present

## 2017-06-09 DIAGNOSIS — R278 Other lack of coordination: Secondary | ICD-10-CM | POA: Diagnosis not present

## 2017-06-09 DIAGNOSIS — M6281 Muscle weakness (generalized): Secondary | ICD-10-CM | POA: Diagnosis not present

## 2017-06-09 DIAGNOSIS — R918 Other nonspecific abnormal finding of lung field: Secondary | ICD-10-CM | POA: Diagnosis not present

## 2017-06-09 DIAGNOSIS — N179 Acute kidney failure, unspecified: Secondary | ICD-10-CM | POA: Diagnosis present

## 2017-06-09 DIAGNOSIS — I10 Essential (primary) hypertension: Secondary | ICD-10-CM | POA: Diagnosis present

## 2017-06-09 DIAGNOSIS — N390 Urinary tract infection, site not specified: Secondary | ICD-10-CM | POA: Diagnosis not present

## 2017-06-09 DIAGNOSIS — M1990 Unspecified osteoarthritis, unspecified site: Secondary | ICD-10-CM | POA: Diagnosis not present

## 2017-06-09 DIAGNOSIS — R4020 Unspecified coma: Secondary | ICD-10-CM | POA: Diagnosis not present

## 2017-06-09 DIAGNOSIS — R55 Syncope and collapse: Secondary | ICD-10-CM | POA: Diagnosis present

## 2017-06-09 DIAGNOSIS — D649 Anemia, unspecified: Secondary | ICD-10-CM | POA: Diagnosis not present

## 2017-06-09 DIAGNOSIS — G253 Myoclonus: Secondary | ICD-10-CM | POA: Diagnosis present

## 2017-06-09 DIAGNOSIS — R569 Unspecified convulsions: Secondary | ICD-10-CM | POA: Diagnosis present

## 2017-06-09 DIAGNOSIS — R296 Repeated falls: Secondary | ICD-10-CM | POA: Diagnosis not present

## 2017-06-09 DIAGNOSIS — R197 Diarrhea, unspecified: Secondary | ICD-10-CM | POA: Diagnosis not present

## 2017-06-09 DIAGNOSIS — E871 Hypo-osmolality and hyponatremia: Secondary | ICD-10-CM | POA: Diagnosis not present

## 2017-06-09 DIAGNOSIS — Z87891 Personal history of nicotine dependence: Secondary | ICD-10-CM | POA: Diagnosis not present

## 2017-06-09 DIAGNOSIS — D509 Iron deficiency anemia, unspecified: Secondary | ICD-10-CM | POA: Diagnosis present

## 2017-06-09 DIAGNOSIS — I517 Cardiomegaly: Secondary | ICD-10-CM | POA: Diagnosis not present

## 2017-06-09 DIAGNOSIS — E869 Volume depletion, unspecified: Secondary | ICD-10-CM | POA: Diagnosis present

## 2017-06-09 DIAGNOSIS — G500 Trigeminal neuralgia: Secondary | ICD-10-CM | POA: Diagnosis present

## 2017-06-09 DIAGNOSIS — R2681 Unsteadiness on feet: Secondary | ICD-10-CM | POA: Diagnosis not present

## 2017-06-09 DIAGNOSIS — Z7401 Bed confinement status: Secondary | ICD-10-CM | POA: Diagnosis not present

## 2017-06-10 DIAGNOSIS — R8281 Pyuria: Secondary | ICD-10-CM | POA: Insufficient documentation

## 2017-06-10 DIAGNOSIS — D649 Anemia, unspecified: Secondary | ICD-10-CM | POA: Insufficient documentation

## 2017-06-13 ENCOUNTER — Other Ambulatory Visit: Payer: Self-pay | Admitting: Internal Medicine

## 2017-06-13 DIAGNOSIS — R531 Weakness: Secondary | ICD-10-CM | POA: Diagnosis not present

## 2017-06-13 DIAGNOSIS — N179 Acute kidney failure, unspecified: Secondary | ICD-10-CM | POA: Diagnosis not present

## 2017-06-13 DIAGNOSIS — I10 Essential (primary) hypertension: Secondary | ICD-10-CM | POA: Diagnosis not present

## 2017-06-13 DIAGNOSIS — R197 Diarrhea, unspecified: Secondary | ICD-10-CM | POA: Diagnosis not present

## 2017-06-13 DIAGNOSIS — R4020 Unspecified coma: Secondary | ICD-10-CM | POA: Diagnosis not present

## 2017-06-13 DIAGNOSIS — R55 Syncope and collapse: Secondary | ICD-10-CM | POA: Diagnosis not present

## 2017-06-13 DIAGNOSIS — M810 Age-related osteoporosis without current pathological fracture: Secondary | ICD-10-CM | POA: Diagnosis not present

## 2017-06-13 DIAGNOSIS — M1990 Unspecified osteoarthritis, unspecified site: Secondary | ICD-10-CM | POA: Diagnosis not present

## 2017-06-13 DIAGNOSIS — E871 Hypo-osmolality and hyponatremia: Secondary | ICD-10-CM | POA: Diagnosis not present

## 2017-06-13 DIAGNOSIS — Z7401 Bed confinement status: Secondary | ICD-10-CM | POA: Diagnosis not present

## 2017-06-13 DIAGNOSIS — I119 Hypertensive heart disease without heart failure: Secondary | ICD-10-CM | POA: Diagnosis not present

## 2017-06-13 DIAGNOSIS — D649 Anemia, unspecified: Secondary | ICD-10-CM | POA: Diagnosis not present

## 2017-06-13 DIAGNOSIS — N390 Urinary tract infection, site not specified: Secondary | ICD-10-CM | POA: Diagnosis not present

## 2017-06-13 DIAGNOSIS — R296 Repeated falls: Secondary | ICD-10-CM | POA: Diagnosis not present

## 2017-06-13 DIAGNOSIS — M6281 Muscle weakness (generalized): Secondary | ICD-10-CM | POA: Diagnosis not present

## 2017-06-13 DIAGNOSIS — R2681 Unsteadiness on feet: Secondary | ICD-10-CM | POA: Diagnosis not present

## 2017-06-13 DIAGNOSIS — R278 Other lack of coordination: Secondary | ICD-10-CM | POA: Diagnosis not present

## 2017-06-13 NOTE — Telephone Encounter (Signed)
Denise Barry from [facility] called and stated that pt needed a 1 week HFU with Dr. Nicki Reaper. Pt was in for syncope and will be discharged to skilled nursing for a few days. Please advise, thank you!

## 2017-06-13 NOTE — Telephone Encounter (Signed)
Patient has not been discharged at this time and per note is going to skill nursing will continue to follow.

## 2017-06-14 NOTE — Telephone Encounter (Signed)
Patient was discharged from [facility] on 06/13/17 to [facility] skill nursing for syncope,Pyuria, and AKI. Patient stated she does not know how long she would need to be at [facility] as evaluated today by physical therapy. Advised patient if she would call me a day before discharge I would consult with PCP and we would find an appointment for her HFU,. Patient did have a couple of medication changes while hospitalized lisinopril was D/C and tramadol added tizanidine,  Advised I would update PCP and would call back and check up on patient until discharge. FYI

## 2017-06-14 NOTE — Telephone Encounter (Signed)
Noted.  Thank you.  Per review of records, she needs a f/u sodium check.  Please make sure is scheduled.

## 2017-06-14 NOTE — Telephone Encounter (Signed)
Patient discharged to Discharge Destination: Skilled nursing facility: [facility]. Will continue to monitor for discharge recommendation is for patient to FU with PCP with in one week of discharge from skilled nursing.

## 2017-06-14 NOTE — Telephone Encounter (Signed)
Patient requested to be called at [phone]

## 2017-06-14 NOTE — Telephone Encounter (Signed)
Sodium level scheduled 06/19/17 at [facility]

## 2017-06-17 DIAGNOSIS — R55 Syncope and collapse: Secondary | ICD-10-CM | POA: Diagnosis not present

## 2017-06-17 DIAGNOSIS — M810 Age-related osteoporosis without current pathological fracture: Secondary | ICD-10-CM | POA: Diagnosis not present

## 2017-06-17 DIAGNOSIS — I119 Hypertensive heart disease without heart failure: Secondary | ICD-10-CM | POA: Diagnosis not present

## 2017-06-21 NOTE — Telephone Encounter (Signed)
Can put in 07/05/17 at 12:30, but please send Caryl Pina a message to hold for cancellation.

## 2017-06-21 NOTE — Telephone Encounter (Signed)
Dr. Nicki Reaper stated hold for earlier cancellation thanks.

## 2017-06-21 NOTE — Telephone Encounter (Signed)
Caryl Pina, can you hold for cancellation.  Thanks

## 2017-06-21 NOTE — Telephone Encounter (Signed)
Recommendation from [facility] is for patient to be seen within one week of discharge, and patient is requesting visit ASAP with PCP and cannot find available spot.  Patient was admitted from [facility] dx syncope , hyponatremia and AKI.

## 2017-06-21 NOTE — Telephone Encounter (Signed)
Transition Care Management Follow-up Telephone Call  How have you been since you were released from the hospital? Feeling much better and being discharged from [facility] today to home.   Do you understand why you were in the hospital?Yes  Do you understand the discharge instrcutions?Yes Items Reviewed:  Medications reviewed:yes  Allergies reviewed: yes  Dietary changes reviewed: yes  Referrals reviewed: yes   Functional Questionnaire:   Activities of Daily Living (ADLs):   She states they are independent in the following: After rehab able to care for ADLs. States they require assistance with the following: No assist needed   Any transportation issues/concerns?: yes , patient depends on son for transportation at this time.   Any patient concerns? {Not at this time just weak.   Confirmed importance and date/time of follow-up visits scheduled: Yes   Confirmed with patient if condition begins to worsen call PCP or go to the ER.  Patient was given the Call-a-Nurse line [phone]: yes

## 2017-06-22 NOTE — Telephone Encounter (Signed)
[facility] changed patient from tramadol to Zanaflex 2 mg for her back pain and she was released from [facility] with just 5 tablets , can we write script until HFU for zanaflex?

## 2017-06-23 MED ORDER — TIZANIDINE HCL 2 MG PO TABS: 2.0000 mg | ORAL_TABLET | Freq: Three times a day (TID) | ORAL | 0 refills | Status: DC | PRN

## 2017-06-23 NOTE — Telephone Encounter (Signed)
Per [facility] chart patient taking Zanaflex  2 mg TID PRN , 10 day supply given due to patient has enough to last two to three days.PER PCP . Patient notified and she stated she does not take except when she really needs this medication.

## 2017-06-23 NOTE — Telephone Encounter (Signed)
[facility] for refill on zanaflex, but need to know how she is taking.  zanaflex 2mg  - can refill enough for two weeks with directions of how she is taking.

## 2017-06-26 ENCOUNTER — Encounter: Payer: Medicare Other | Admitting: Internal Medicine

## 2017-06-26 ENCOUNTER — Telehealth: Payer: Self-pay | Admitting: Internal Medicine

## 2017-06-26 NOTE — Telephone Encounter (Signed)
Noted.  Reviewed this after sent response to the other message.

## 2017-06-26 NOTE — Telephone Encounter (Signed)
Called Santiago Glad at [facility] back states that patient was discharged from [facility] last week and family wanted to get order for skilled nursing, PT and OT. They would like help with medication management and strength training. States 3 days after she was D/C she had fall. Not bad has small wound on arm but can tell she is getting weaker and worried that she may have more falls.  Informed you are out of office and will be addressed tomorrow.

## 2017-06-26 NOTE — Telephone Encounter (Signed)
Wanting skill nursing , PT and OT for the patient if you are needing more information you can call Santiago Glad with [facility] at [phone] or the patient's daughter.

## 2017-06-26 NOTE — Telephone Encounter (Signed)
[facility] for orders.  Confirm doing ok from fall.

## 2017-06-26 NOTE — Telephone Encounter (Signed)
Pt daughter called and wanted Dr. Nicki Reaper know that pt will be going back to rehab for a few more days. Her insurance company will pay for her to go back. Any questions call Opal Sidles.  Call Jane @ [phone]

## 2017-06-26 NOTE — Telephone Encounter (Signed)
See other message

## 2017-06-27 DIAGNOSIS — M1990 Unspecified osteoarthritis, unspecified site: Secondary | ICD-10-CM | POA: Diagnosis not present

## 2017-06-27 DIAGNOSIS — M545 Low back pain: Secondary | ICD-10-CM | POA: Diagnosis not present

## 2017-06-27 DIAGNOSIS — R278 Other lack of coordination: Secondary | ICD-10-CM | POA: Diagnosis not present

## 2017-06-27 DIAGNOSIS — R296 Repeated falls: Secondary | ICD-10-CM | POA: Diagnosis not present

## 2017-06-27 DIAGNOSIS — R55 Syncope and collapse: Secondary | ICD-10-CM | POA: Diagnosis not present

## 2017-06-27 DIAGNOSIS — R2681 Unsteadiness on feet: Secondary | ICD-10-CM | POA: Diagnosis not present

## 2017-06-27 DIAGNOSIS — M6281 Muscle weakness (generalized): Secondary | ICD-10-CM | POA: Diagnosis not present

## 2017-06-27 DIAGNOSIS — I10 Essential (primary) hypertension: Secondary | ICD-10-CM | POA: Diagnosis not present

## 2017-06-27 DIAGNOSIS — M810 Age-related osteoporosis without current pathological fracture: Secondary | ICD-10-CM | POA: Diagnosis not present

## 2017-06-27 DIAGNOSIS — E871 Hypo-osmolality and hyponatremia: Secondary | ICD-10-CM | POA: Diagnosis not present

## 2017-06-27 DIAGNOSIS — I119 Hypertensive heart disease without heart failure: Secondary | ICD-10-CM | POA: Diagnosis not present

## 2017-06-27 DIAGNOSIS — G500 Trigeminal neuralgia: Secondary | ICD-10-CM | POA: Diagnosis not present

## 2017-06-27 NOTE — Telephone Encounter (Signed)
Per next message pt going back to rehab.

## 2017-07-01 DIAGNOSIS — R55 Syncope and collapse: Secondary | ICD-10-CM | POA: Diagnosis not present

## 2017-07-01 DIAGNOSIS — I119 Hypertensive heart disease without heart failure: Secondary | ICD-10-CM | POA: Diagnosis not present

## 2017-07-01 DIAGNOSIS — M810 Age-related osteoporosis without current pathological fracture: Secondary | ICD-10-CM | POA: Diagnosis not present

## 2017-07-05 ENCOUNTER — Encounter: Payer: Self-pay | Admitting: Internal Medicine

## 2017-07-05 ENCOUNTER — Ambulatory Visit (INDEPENDENT_AMBULATORY_CARE_PROVIDER_SITE_OTHER): Payer: Medicare Other | Admitting: Internal Medicine

## 2017-07-05 VITALS — BP 120/72 | HR 78 | Temp 98.1°F | Resp 18 | Wt 160.1 lb

## 2017-07-05 DIAGNOSIS — M545 Low back pain, unspecified: Secondary | ICD-10-CM

## 2017-07-05 DIAGNOSIS — G500 Trigeminal neuralgia: Secondary | ICD-10-CM | POA: Diagnosis not present

## 2017-07-05 DIAGNOSIS — E871 Hypo-osmolality and hyponatremia: Secondary | ICD-10-CM | POA: Diagnosis not present

## 2017-07-05 DIAGNOSIS — R55 Syncope and collapse: Secondary | ICD-10-CM

## 2017-07-05 DIAGNOSIS — I10 Essential (primary) hypertension: Secondary | ICD-10-CM

## 2017-07-05 LAB — TSH: TSH: 2.21 u[IU]/mL (ref 0.35–4.50)

## 2017-07-05 LAB — BASIC METABOLIC PANEL
BUN: 16 mg/dL (ref 6–23)
CALCIUM: 9.2 mg/dL (ref 8.4–10.5)
CO2: 29 meq/L (ref 19–32)
Chloride: 93 mEq/L — ABNORMAL LOW (ref 96–112)
Creatinine, Ser: 0.82 mg/dL (ref 0.40–1.20)
GFR: 71.1 mL/min (ref >60.00)
Glucose, Bld: 91 mg/dL (ref 70–99)
Potassium: 4 mEq/L (ref 3.5–5.1)
SODIUM: 129 meq/L — AB (ref 135–145)

## 2017-07-05 MED ORDER — AMLODIPINE BESYLATE 10 MG PO TABS: 10.0000 mg | ORAL_TABLET | Freq: Every day | ORAL | 3 refills | Status: DC

## 2017-07-05 NOTE — Progress Notes (Signed)
Patient ID: Denise Barry, female   DOB: February 13, 1934, 83 y.o.   MRN: [id number]   Subjective:    Patient ID: Denise Barry, female    DOB: 1934/09/29, 83 y.o.   MRN: [id number]  HPI  Patient here for hospital follow up. She was admitted 06/09/17 for loss of consciousness.  She had diarrhea.  Per note, while having a bowel movement, had a brief period of unresponsiveness and shaking of upper extremities.  No post ictal period after.  Suspect vasovagal syncope plus convulsion in setting of volume depletion and evacuation on commode.  (question of seizure, cardiac arrhythmia or other etiology of syncope).  Was found to be orthostatic.  Given IV fluids.  Monitored on telemetry without any significant events.  Had EEG - diffuse slowing.  Tramadol was discontinued secondary to concerns regarding a possible seizure.  On tizanidine now.  Back is better.  Was found to have low sodium.  Diarrhea resolved.  Eating better.  Lisinopril was stopped. She is in rehab now and working with physical therapy.  She is transitioning from a walker to a cane.  Planning to be discharged home soon.     Past Medical History:  Diagnosis Date  . Hypercholesterolemia   . Hypertension   . Osteoarthritis   . Urinary incontinence    pessary in place  . Varicose veins    H/O   Past Surgical History:  Procedure Laterality Date  . BACK SURGERY  09/16/13  . DILATION AND CURETTAGE OF UTERUS  1971  . JOINT REPLACEMENT    . sclerosis  2000  . TUBAL LIGATION  1972   Family History  Problem Relation Age of Onset  . Heart disease Mother   . Diabetes Mother   . Hypertension Mother   . Heart disease Father   . Stroke Father   . Breast cancer Neg Hx    Social History   Social History  . Marital status: Widowed    Spouse name: N/A  . Number of children: N/A  . Years of education: N/A   Social History Main Topics  . Smoking status: Former Research scientist (life sciences)  . Smokeless tobacco: Never Used  . Alcohol use No  . Drug  use: No  . Sexual activity: No   Other Topics Concern  . None   Social History Narrative  . None    Outpatient Encounter Prescriptions as of 07/05/2017  Medication Sig  . Acetaminophen (TYLENOL EXTRA STRENGTH PO) Take 1-2 tablets by mouth daily.  Marland Kitchen aspirin 81 MG tablet Take 81 mg by mouth daily.  . Eslicarbazepine Acetate (APTIOM PO) Take 1 tablet by mouth 2 (two) times daily.  . fexofenadine (ALLEGRA) 180 MG tablet Take 180 mg by mouth as needed for allergies or rhinitis.  Marland Kitchen ibuprofen (ADVIL,MOTRIN) 600 MG tablet Take 1 tablet (600 mg total) by mouth every 8 (eight) hours as needed.  . Multiple Vitamins-Minerals (CENTRUM SILVER ULTRA WOMENS PO) Take by mouth.  . [DISCONTINUED] amLODipine (NORVASC) 2.5 MG tablet Take 1 tablet (2.5 mg total) by mouth daily. (Patient taking differently: Take 10 mg by mouth daily. )  . amLODipine (NORVASC) 10 MG tablet Take 1 tablet (10 mg total) by mouth daily.  Marland Kitchen tiZANidine (ZANAFLEX) 2 MG tablet Take 1 tablet (2 mg total) by mouth every 8 (eight) hours as needed for muscle spasms. (Patient not taking: Reported on 07/05/2017)  . [DISCONTINUED] lidocaine (LIDODERM) 5 % Place 1 patch onto the skin daily. Remove & Discard patch within 12  hours. (Patient not taking: Reported on 07/05/2017)  . [DISCONTINUED] lisinopril (PRINIVIL,ZESTRIL) 40 MG tablet Take 1 tablet (40 mg total) by mouth daily. (Patient not taking: Reported on 07/05/2017)  . [DISCONTINUED] traMADol (ULTRAM) 50 MG tablet Take 1 tablet (50 mg total) by mouth 2 (two) times daily as needed. (Patient not taking: Reported on 07/05/2017)   No facility-administered encounter medications on file as of 07/05/2017.     Review of Systems  Constitutional: Positive for fatigue. Negative for unexpected weight change.  HENT: Negative for congestion and sinus pressure.   Respiratory: Negative for cough, chest tightness and shortness of breath.   Cardiovascular: Negative for chest pain, palpitations and leg swelling.    Gastrointestinal: Negative for abdominal pain, nausea and vomiting.       Diarrhea has resolved.    Genitourinary: Negative for difficulty urinating and dysuria.  Musculoskeletal: Positive for back pain. Negative for joint swelling.  Skin: Negative for color change and rash.  Neurological: Negative for dizziness, light-headedness and headaches.  Psychiatric/Behavioral: Negative for agitation and dysphoric mood.       Objective:    Physical Exam  Constitutional: She appears well-developed and well-nourished. No distress.  HENT:  Nose: Nose normal.  Mouth/Throat: Oropharynx is clear and moist.  Neck: Neck supple. No thyromegaly present.  Cardiovascular: Normal rate and regular rhythm.   Pulmonary/Chest: Breath sounds normal. No respiratory distress. She has no wheezes.  Abdominal: Soft. Bowel sounds are normal. There is no tenderness.  Musculoskeletal: She exhibits no edema or tenderness.  Lymphadenopathy:    She has no cervical adenopathy.  Skin: No rash noted. No erythema.  Psychiatric: She has a normal mood and affect. Her behavior is normal.    BP 120/72 (BP Location: Left Arm, Patient Position: Sitting, Cuff Size: Normal)   Pulse 78   Temp 98.1 F (36.7 C) (Oral)   Resp 18   Wt 160 lb 2 oz (72.6 kg)   SpO2 96%   BMI 24.35 kg/m  Wt Readings from Last 3 Encounters:  07/05/17 160 lb 2 oz (72.6 kg)  05/18/17 158 lb 8 oz (71.9 kg)  05/04/17 161 lb (73 kg)     Lab Results  Component Value Date   WBC 9.8 02/22/2017   HGB 12.6 02/22/2017   HCT 38.0 02/22/2017   PLT 301.0 02/22/2017   GLUCOSE 91 07/05/2017   CHOL 205 (H) 02/22/2017   TRIG 94.0 02/22/2017   HDL 71.30 02/22/2017   LDLCALC 115 (H) 02/22/2017   ALT 10 02/22/2017   AST 15 02/22/2017   NA 129 (L) 07/05/2017   K 4.0 07/05/2017   CL 93 (L) 07/05/2017   CREATININE 0.82 07/05/2017   BUN 16 07/05/2017   CO2 29 07/05/2017   TSH 2.21 07/05/2017   INR 1.0 02/03/2015    Mm Digital Screening  Bilateral  Result Date: 01/11/2017 CLINICAL DATA:  Screening. EXAM: DIGITAL SCREENING BILATERAL MAMMOGRAM WITH CAD COMPARISON:  Previous exam(s). ACR Breast Density Category b: There are scattered areas of fibroglandular density. FINDINGS: There are no findings suspicious for malignancy. Images were processed with CAD. IMPRESSION: No mammographic evidence of malignancy. A result letter of this screening mammogram will be mailed directly to the patient. RECOMMENDATION: Screening mammogram in one year. (Code:SM-B-01Y) BI-RADS CATEGORY  1: Negative. Electronically Signed   By: Lillia Mountain M.D.   On: 01/11/2017 10:17       Assessment & Plan:   Problem List Items Addressed This Visit    Back pain  S/p kyphoplasty.  Pain is better.  Continue therapy.        Essential hypertension, benign    Blood pressure doing well on amlodipine.  Remain off lisinopril.  Follow metabolic panel.       Relevant Medications   amLODipine (NORVASC) 10 MG tablet   Hyponatremia - Primary    Diarrhea resolved.  Eating better.  Recheck sodium today.  Did not tolerate salt tablets.  Question raised if atiom contributing.  Recheck today.        Relevant Orders   TSH (Completed)   Basic metabolic panel (Completed)   Ambulatory referral to Neurology   Syncope    Had syncope as outlined.  Felt to possibly be related to a vasovagal episode.  W/up as outlined.  Discussed f/u with cardiology for possible event monitor.  She declines at this time.  Discussed f/u with neurology to determine if any further w/up warranted for possible seizure.  No further symptoms.  Follow.        Relevant Medications   amLODipine (NORVASC) 10 MG tablet   Other Relevant Orders   Ambulatory referral to Neurology   Trigeminal neuralgia    On aptiom.  Stable.  Concern raised if aptiom was possibly contributing to low sodium.  F/u with neurology.  Will need appt scheduled.            Einar Pheasant, MD

## 2017-07-05 NOTE — Progress Notes (Signed)
Pre visit review using our clinic review tool, if applicable. No additional management support is needed unless otherwise documented below in the visit note. 

## 2017-07-07 ENCOUNTER — Other Ambulatory Visit: Payer: Self-pay | Admitting: Internal Medicine

## 2017-07-07 DIAGNOSIS — G500 Trigeminal neuralgia: Secondary | ICD-10-CM | POA: Diagnosis not present

## 2017-07-07 DIAGNOSIS — R55 Syncope and collapse: Secondary | ICD-10-CM | POA: Diagnosis not present

## 2017-07-07 DIAGNOSIS — R262 Difficulty in walking, not elsewhere classified: Secondary | ICD-10-CM | POA: Diagnosis not present

## 2017-07-07 DIAGNOSIS — Z7982 Long term (current) use of aspirin: Secondary | ICD-10-CM | POA: Diagnosis not present

## 2017-07-07 DIAGNOSIS — S51801D Unspecified open wound of right forearm, subsequent encounter: Secondary | ICD-10-CM | POA: Diagnosis not present

## 2017-07-07 DIAGNOSIS — D509 Iron deficiency anemia, unspecified: Secondary | ICD-10-CM | POA: Diagnosis not present

## 2017-07-07 DIAGNOSIS — I10 Essential (primary) hypertension: Secondary | ICD-10-CM | POA: Diagnosis not present

## 2017-07-07 DIAGNOSIS — E871 Hypo-osmolality and hyponatremia: Secondary | ICD-10-CM

## 2017-07-07 DIAGNOSIS — D649 Anemia, unspecified: Secondary | ICD-10-CM

## 2017-07-07 DIAGNOSIS — M545 Low back pain: Secondary | ICD-10-CM | POA: Diagnosis not present

## 2017-07-07 NOTE — Progress Notes (Signed)
Order placed for f/u labs.  

## 2017-07-08 ENCOUNTER — Encounter: Payer: Self-pay | Admitting: Internal Medicine

## 2017-07-08 DIAGNOSIS — R55 Syncope and collapse: Secondary | ICD-10-CM | POA: Insufficient documentation

## 2017-07-08 NOTE — Assessment & Plan Note (Signed)
Diarrhea resolved.  Eating better.  Recheck sodium today.  Did not tolerate salt tablets.  Question raised if atiom contributing.  Recheck today.

## 2017-07-08 NOTE — Assessment & Plan Note (Signed)
Had syncope as outlined.  Felt to possibly be related to a vasovagal episode.  W/up as outlined.  Discussed f/u with cardiology for possible event monitor.  She declines at this time.  Discussed f/u with neurology to determine if any further w/up warranted for possible seizure.  No further symptoms.  Follow.

## 2017-07-08 NOTE — Assessment & Plan Note (Signed)
Blood pressure doing well on amlodipine.  Remain off lisinopril.  Follow metabolic panel.

## 2017-07-08 NOTE — Assessment & Plan Note (Signed)
On aptiom.  Stable.  Concern raised if aptiom was possibly contributing to low sodium.  F/u with neurology.  Will need appt scheduled.

## 2017-07-08 NOTE — Assessment & Plan Note (Signed)
S/p kyphoplasty.  Pain is better.  Continue therapy.

## 2017-07-10 DIAGNOSIS — R55 Syncope and collapse: Secondary | ICD-10-CM | POA: Diagnosis not present

## 2017-07-10 DIAGNOSIS — R262 Difficulty in walking, not elsewhere classified: Secondary | ICD-10-CM | POA: Diagnosis not present

## 2017-07-10 DIAGNOSIS — S51801D Unspecified open wound of right forearm, subsequent encounter: Secondary | ICD-10-CM | POA: Diagnosis not present

## 2017-07-10 DIAGNOSIS — G500 Trigeminal neuralgia: Secondary | ICD-10-CM | POA: Diagnosis not present

## 2017-07-10 DIAGNOSIS — M545 Low back pain: Secondary | ICD-10-CM | POA: Diagnosis not present

## 2017-07-10 DIAGNOSIS — I10 Essential (primary) hypertension: Secondary | ICD-10-CM | POA: Diagnosis not present

## 2017-07-11 ENCOUNTER — Other Ambulatory Visit (INDEPENDENT_AMBULATORY_CARE_PROVIDER_SITE_OTHER): Payer: Medicare Other

## 2017-07-11 DIAGNOSIS — D649 Anemia, unspecified: Secondary | ICD-10-CM

## 2017-07-11 DIAGNOSIS — E871 Hypo-osmolality and hyponatremia: Secondary | ICD-10-CM

## 2017-07-11 LAB — CBC WITH DIFFERENTIAL/PLATELET
BASOS PCT: 1.2 % (ref 0.0–3.0)
Basophils Absolute: 0.1 10*3/uL (ref 0.0–0.1)
EOS ABS: 0 10*3/uL (ref 0.0–0.7)
EOS PCT: 0.7 % (ref 0.0–5.0)
HEMATOCRIT: 31.4 % — AB (ref 36.0–46.0)
HEMOGLOBIN: 10.1 g/dL — AB (ref 12.0–15.0)
LYMPHS PCT: 17.7 % (ref 12.0–46.0)
Lymphs Abs: 1 10*3/uL (ref 0.7–4.0)
MCHC: 32.2 g/dL (ref 30.0–36.0)
MCV: 86 fl (ref 78.0–100.0)
MONOS PCT: 11.1 % (ref 3.0–12.0)
Monocytes Absolute: 0.6 10*3/uL (ref 0.1–1.0)
NEUTROS ABS: 3.9 10*3/uL (ref 1.4–7.7)
Neutrophils Relative %: 69.3 % (ref 43.0–77.0)
Platelets: 416 10*3/uL — ABNORMAL HIGH (ref 150.0–400.0)
RBC: 3.65 Mil/uL — ABNORMAL LOW (ref 3.87–5.11)
RDW: 14.9 % (ref 11.5–15.5)
WBC: 5.6 10*3/uL (ref 4.0–10.5)

## 2017-07-11 LAB — SODIUM: Sodium: 136 mEq/L (ref 135–145)

## 2017-07-11 LAB — FERRITIN: Ferritin: 7.7 ng/mL — ABNORMAL LOW (ref 10.0–291.0)

## 2017-07-12 DIAGNOSIS — R55 Syncope and collapse: Secondary | ICD-10-CM | POA: Diagnosis not present

## 2017-07-12 DIAGNOSIS — G500 Trigeminal neuralgia: Secondary | ICD-10-CM | POA: Diagnosis not present

## 2017-07-12 DIAGNOSIS — M545 Low back pain: Secondary | ICD-10-CM | POA: Diagnosis not present

## 2017-07-12 DIAGNOSIS — S51801D Unspecified open wound of right forearm, subsequent encounter: Secondary | ICD-10-CM | POA: Diagnosis not present

## 2017-07-12 DIAGNOSIS — I10 Essential (primary) hypertension: Secondary | ICD-10-CM | POA: Diagnosis not present

## 2017-07-12 DIAGNOSIS — M549 Dorsalgia, unspecified: Secondary | ICD-10-CM | POA: Diagnosis not present

## 2017-07-12 DIAGNOSIS — W19XXXA Unspecified fall, initial encounter: Secondary | ICD-10-CM | POA: Diagnosis not present

## 2017-07-12 DIAGNOSIS — M4326 Fusion of spine, lumbar region: Secondary | ICD-10-CM | POA: Diagnosis not present

## 2017-07-12 DIAGNOSIS — R262 Difficulty in walking, not elsewhere classified: Secondary | ICD-10-CM | POA: Diagnosis not present

## 2017-07-13 ENCOUNTER — Telehealth: Payer: Self-pay | Admitting: Internal Medicine

## 2017-07-13 DIAGNOSIS — G500 Trigeminal neuralgia: Secondary | ICD-10-CM | POA: Diagnosis not present

## 2017-07-13 DIAGNOSIS — M545 Low back pain: Secondary | ICD-10-CM | POA: Diagnosis not present

## 2017-07-13 DIAGNOSIS — I10 Essential (primary) hypertension: Secondary | ICD-10-CM | POA: Diagnosis not present

## 2017-07-13 DIAGNOSIS — R262 Difficulty in walking, not elsewhere classified: Secondary | ICD-10-CM | POA: Diagnosis not present

## 2017-07-13 DIAGNOSIS — S51801D Unspecified open wound of right forearm, subsequent encounter: Secondary | ICD-10-CM | POA: Diagnosis not present

## 2017-07-13 DIAGNOSIS — R55 Syncope and collapse: Secondary | ICD-10-CM | POA: Diagnosis not present

## 2017-07-13 NOTE — Telephone Encounter (Signed)
fyi

## 2017-07-13 NOTE — Telephone Encounter (Signed)
Thank you :)

## 2017-07-13 NOTE — Telephone Encounter (Signed)
Denise Barry from [facility] called and stated that she dressed patients wound with gauze and transparent tape. She feels wound will be healed by Monday.  Call [facility] @ [phone]

## 2017-07-17 DIAGNOSIS — M545 Low back pain: Secondary | ICD-10-CM | POA: Diagnosis not present

## 2017-07-17 DIAGNOSIS — G500 Trigeminal neuralgia: Secondary | ICD-10-CM | POA: Diagnosis not present

## 2017-07-17 DIAGNOSIS — I10 Essential (primary) hypertension: Secondary | ICD-10-CM | POA: Diagnosis not present

## 2017-07-17 DIAGNOSIS — R262 Difficulty in walking, not elsewhere classified: Secondary | ICD-10-CM | POA: Diagnosis not present

## 2017-07-17 DIAGNOSIS — S51801D Unspecified open wound of right forearm, subsequent encounter: Secondary | ICD-10-CM | POA: Diagnosis not present

## 2017-07-17 DIAGNOSIS — R55 Syncope and collapse: Secondary | ICD-10-CM | POA: Diagnosis not present

## 2017-07-20 ENCOUNTER — Telehealth: Payer: Self-pay

## 2017-07-20 DIAGNOSIS — R262 Difficulty in walking, not elsewhere classified: Secondary | ICD-10-CM | POA: Diagnosis not present

## 2017-07-20 DIAGNOSIS — M545 Low back pain: Secondary | ICD-10-CM | POA: Diagnosis not present

## 2017-07-20 DIAGNOSIS — D509 Iron deficiency anemia, unspecified: Secondary | ICD-10-CM | POA: Diagnosis not present

## 2017-07-20 DIAGNOSIS — I10 Essential (primary) hypertension: Secondary | ICD-10-CM | POA: Diagnosis not present

## 2017-07-20 DIAGNOSIS — Z7982 Long term (current) use of aspirin: Secondary | ICD-10-CM | POA: Diagnosis not present

## 2017-07-20 DIAGNOSIS — S51801D Unspecified open wound of right forearm, subsequent encounter: Secondary | ICD-10-CM | POA: Diagnosis not present

## 2017-07-20 DIAGNOSIS — G500 Trigeminal neuralgia: Secondary | ICD-10-CM | POA: Diagnosis not present

## 2017-07-20 DIAGNOSIS — R55 Syncope and collapse: Secondary | ICD-10-CM | POA: Diagnosis not present

## 2017-07-20 NOTE — Telephone Encounter (Signed)
Home health certification form received placed in red folder.

## 2017-07-21 ENCOUNTER — Other Ambulatory Visit: Payer: Self-pay

## 2017-07-21 NOTE — Telephone Encounter (Signed)
Form signed and placed in box.   

## 2017-07-21 NOTE — Telephone Encounter (Signed)
Form faxed copy sent to charge

## 2017-07-24 DIAGNOSIS — M4326 Fusion of spine, lumbar region: Secondary | ICD-10-CM | POA: Diagnosis not present

## 2017-07-24 DIAGNOSIS — W19XXXA Unspecified fall, initial encounter: Secondary | ICD-10-CM | POA: Diagnosis not present

## 2017-07-24 DIAGNOSIS — M549 Dorsalgia, unspecified: Secondary | ICD-10-CM | POA: Diagnosis not present

## 2017-07-25 ENCOUNTER — Encounter: Payer: Self-pay | Admitting: Family

## 2017-07-25 ENCOUNTER — Ambulatory Visit (INDEPENDENT_AMBULATORY_CARE_PROVIDER_SITE_OTHER): Payer: Medicare Other | Admitting: Family

## 2017-07-25 VITALS — BP 126/78 | HR 83 | Temp 97.6°F | Ht 68.0 in | Wt 156.6 lb

## 2017-07-25 DIAGNOSIS — G500 Trigeminal neuralgia: Secondary | ICD-10-CM | POA: Diagnosis not present

## 2017-07-25 DIAGNOSIS — R300 Dysuria: Secondary | ICD-10-CM | POA: Diagnosis not present

## 2017-07-25 DIAGNOSIS — S51801D Unspecified open wound of right forearm, subsequent encounter: Secondary | ICD-10-CM | POA: Diagnosis not present

## 2017-07-25 DIAGNOSIS — M545 Low back pain: Secondary | ICD-10-CM | POA: Diagnosis not present

## 2017-07-25 DIAGNOSIS — R262 Difficulty in walking, not elsewhere classified: Secondary | ICD-10-CM | POA: Diagnosis not present

## 2017-07-25 DIAGNOSIS — R55 Syncope and collapse: Secondary | ICD-10-CM | POA: Diagnosis not present

## 2017-07-25 DIAGNOSIS — I10 Essential (primary) hypertension: Secondary | ICD-10-CM | POA: Diagnosis not present

## 2017-07-25 LAB — POCT URINALYSIS DIPSTICK
BILIRUBIN UA: NEGATIVE
GLUCOSE UA: NEGATIVE
NITRITE UA: NEGATIVE
PH UA: 7 (ref 5.0–8.0)
Protein, UA: 30
Spec Grav, UA: 1.015 (ref 1.010–1.025)
Urobilinogen, UA: 0.2 E.U./dL

## 2017-07-25 LAB — URINALYSIS, MICROSCOPIC ONLY

## 2017-07-25 NOTE — Progress Notes (Signed)
Subjective:    Patient ID: Denise Barry, female    DOB: [birth year], 83 y.o.   MRN: [id number]  CC: Denise Barry is a 83 y.o. female who presents today for an acute visit.    HPI: CC: low back pain however has been having chronic low back pain and unsure if it's urinary. Some urinary frequency however thinks it is from aptiom. No dysuria, urgency, hematuria. Would like to ensure urinary tract infection not contributory to back pain.   Had uti two months ago and treated with antibiotics, unsure of name. Symptoms seemed to improve.       Hospitalized 05/2017  After fall   Following with [facility] since that time. Flexeril working for pain management, not drowsy on this medication.  MRI lumbar and thoracic done yesterday. No results of yet.    H/o fusion of lumbar spine.  HISTORY:  Past Medical History:  Diagnosis Date  . Hypercholesterolemia   . Hypertension   . Osteoarthritis   . Urinary incontinence    pessary in place  . Varicose veins    H/O   Past Surgical History:  Procedure Laterality Date  . BACK SURGERY  09/16/13  . DILATION AND CURETTAGE OF UTERUS  1971  . JOINT REPLACEMENT    . sclerosis  2000  . TUBAL LIGATION  1972   Family History  Problem Relation Age of Onset  . Heart disease Mother   . Diabetes Mother   . Hypertension Mother   . Heart disease Father   . Stroke Father   . Breast cancer Neg Hx     Allergies: Codeine; Cyclobenzaprine; Methocarbamol; Carbamazepine; and Gabapentin Current Outpatient Prescriptions on File Prior to Visit  Medication Sig Dispense Refill  . Acetaminophen (TYLENOL EXTRA STRENGTH PO) Take 1-2 tablets by mouth daily.    Marland Kitchen amLODipine (NORVASC) 10 MG tablet Take 1 tablet (10 mg total) by mouth daily. 30 tablet 3  . aspirin 81 MG tablet Take 81 mg by mouth daily.    . Eslicarbazepine Acetate (APTIOM PO) Take 1 tablet by mouth 2 (two) times daily.    . fexofenadine (ALLEGRA) 180 MG tablet Take 180 mg by  mouth as needed for allergies or rhinitis.    Marland Kitchen ibuprofen (ADVIL,MOTRIN) 600 MG tablet Take 1 tablet (600 mg total) by mouth every 8 (eight) hours as needed. 30 tablet 0  . Multiple Vitamins-Minerals (CENTRUM SILVER ULTRA WOMENS PO) Take by mouth.    Marland Kitchen tiZANidine (ZANAFLEX) 2 MG tablet Take 1 tablet (2 mg total) by mouth every 8 (eight) hours as needed for muscle spasms. 30 tablet 0   No current facility-administered medications on file prior to visit.     Social History  Substance Use Topics  . Smoking status: Former Research scientist (life sciences)  . Smokeless tobacco: Never Used  . Alcohol use No    Review of Systems  Constitutional: Negative for chills and fever.  Respiratory: Negative for cough.   Cardiovascular: Negative for chest pain and palpitations.  Gastrointestinal: Negative for nausea and vomiting.  Genitourinary: Positive for frequency. Negative for dysuria and hematuria.  Musculoskeletal: Positive for back pain.      Objective:    BP 126/78   Pulse 83   Temp 97.6 F (36.4 C) (Oral)   Ht 5\' 8"  (1.727 m)   Wt 156 lb 9.6 oz (71 kg)   SpO2 94%   BMI 23.81 kg/m    Physical Exam  Constitutional: She appears well-developed and well-nourished.  Cardiovascular:  Normal rate, regular rhythm, normal heart sounds and normal pulses.   Pulmonary/Chest: Effort normal and breath sounds normal. She has no wheezes. She has no rhonchi. She has no rales.  Abdominal: There is no CVA tenderness.  Neurological: She is alert.  Skin: Skin is warm and dry.  Psychiatric: She has a normal mood and affect. Her speech is normal and behavior is normal. Thought content normal.  Vitals reviewed.      Assessment & Plan:   .1. Dysuria Afebrile. Well appearing. We jointly agreed in absence of gross urinary complaints, we will await urine culture. Dip shows ketones, blood, large leukocytes. Advised if culture doesn't show infection to ensure continued management with [facility] for low back pain. Patient  verbalized understanding. - Urine Culture - POCT Urinalysis Dipstick - Urine Microscopic    I am having Denise Barry maintain her Multiple Vitamins-Minerals (CENTRUM SILVER ULTRA WOMENS PO), aspirin, fexofenadine, Acetaminophen (TYLENOL EXTRA STRENGTH PO), Eslicarbazepine Acetate (APTIOM PO), ibuprofen, tiZANidine, and amLODipine.   No orders of the defined types were placed in this encounter.   Return precautions given.   Risks, benefits, and alternatives of the medications and treatment plan prescribed today were discussed, and patient expressed understanding.   Education regarding symptom management and diagnosis given to patient on AVS.  Continue to follow with Denise Pheasant, MD for routine health maintenance.   Denise Barry and I agreed with plan.   Denise Paris, FNP

## 2017-07-25 NOTE — Patient Instructions (Addendum)
Plenty of water  Will call you with results of culture as discussed

## 2017-07-25 NOTE — Progress Notes (Signed)
Pre visit review using our clinic review tool, if applicable. No additional management support is needed unless otherwise documented below in the visit note. 

## 2017-07-26 DIAGNOSIS — I10 Essential (primary) hypertension: Secondary | ICD-10-CM | POA: Diagnosis not present

## 2017-07-26 DIAGNOSIS — S51801D Unspecified open wound of right forearm, subsequent encounter: Secondary | ICD-10-CM | POA: Diagnosis not present

## 2017-07-26 DIAGNOSIS — M545 Low back pain: Secondary | ICD-10-CM | POA: Diagnosis not present

## 2017-07-26 DIAGNOSIS — G500 Trigeminal neuralgia: Secondary | ICD-10-CM | POA: Diagnosis not present

## 2017-07-26 DIAGNOSIS — R262 Difficulty in walking, not elsewhere classified: Secondary | ICD-10-CM | POA: Diagnosis not present

## 2017-07-26 DIAGNOSIS — R55 Syncope and collapse: Secondary | ICD-10-CM | POA: Diagnosis not present

## 2017-07-26 LAB — URINE CULTURE
MICRO NUMBER: 81060345
RESULT: NO GROWTH
SPECIMEN QUALITY: ADEQUATE

## 2017-07-27 ENCOUNTER — Telehealth: Payer: Self-pay | Admitting: *Deleted

## 2017-07-27 DIAGNOSIS — S51801D Unspecified open wound of right forearm, subsequent encounter: Secondary | ICD-10-CM | POA: Diagnosis not present

## 2017-07-27 DIAGNOSIS — R262 Difficulty in walking, not elsewhere classified: Secondary | ICD-10-CM | POA: Diagnosis not present

## 2017-07-27 DIAGNOSIS — M545 Low back pain: Secondary | ICD-10-CM | POA: Diagnosis not present

## 2017-07-27 DIAGNOSIS — G500 Trigeminal neuralgia: Secondary | ICD-10-CM | POA: Diagnosis not present

## 2017-07-27 DIAGNOSIS — R55 Syncope and collapse: Secondary | ICD-10-CM | POA: Diagnosis not present

## 2017-07-27 DIAGNOSIS — I10 Essential (primary) hypertension: Secondary | ICD-10-CM | POA: Diagnosis not present

## 2017-07-27 NOTE — Telephone Encounter (Signed)
Called patient she would like 4 mg she is taking tid. She would like call back when we get sent to pharmacy.

## 2017-07-27 NOTE — Telephone Encounter (Signed)
Pt requested urinalysis results  A medication refill for tizanidine [place] contact [phone]

## 2017-07-27 NOTE — Telephone Encounter (Signed)
Called patient let her know I did not see where culture was back yet. That Fransisco Beau will give her a call when received.   She would like refill on tizanidine  Last fill 06/23/17  Given for 1 po q8hrs but she is taking 2. Pt states that she does not get any help with just one. She states that she only has about 9 pills. She was very concerned about running out over the weekend. She wanted to make sure that we call in today so that she can pick up.

## 2017-07-27 NOTE — Telephone Encounter (Signed)
Call pt and explain, she is on tizanidine 2mg .  Since she is having to take two (2mg ) tablets, I can change her to tizanidine 4mg .  Should would only take one tablet then.  Is she taking tid.  Just need to confirm.

## 2017-07-28 DIAGNOSIS — S51801D Unspecified open wound of right forearm, subsequent encounter: Secondary | ICD-10-CM | POA: Diagnosis not present

## 2017-07-28 DIAGNOSIS — R262 Difficulty in walking, not elsewhere classified: Secondary | ICD-10-CM | POA: Diagnosis not present

## 2017-07-28 DIAGNOSIS — G500 Trigeminal neuralgia: Secondary | ICD-10-CM | POA: Diagnosis not present

## 2017-07-28 DIAGNOSIS — I10 Essential (primary) hypertension: Secondary | ICD-10-CM | POA: Diagnosis not present

## 2017-07-28 DIAGNOSIS — R55 Syncope and collapse: Secondary | ICD-10-CM | POA: Diagnosis not present

## 2017-07-28 DIAGNOSIS — M545 Low back pain: Secondary | ICD-10-CM | POA: Diagnosis not present

## 2017-07-28 MED ORDER — TIZANIDINE HCL 4 MG PO TABS: 4.0000 mg | ORAL_TABLET | Freq: Three times a day (TID) | ORAL | 0 refills | Status: DC | PRN

## 2017-07-28 NOTE — Telephone Encounter (Signed)
Called pt to let her know v/m not set up.

## 2017-07-28 NOTE — Telephone Encounter (Signed)
rx sent in for tizanidine 4mg  to be taken tid prn.

## 2017-07-31 DIAGNOSIS — G500 Trigeminal neuralgia: Secondary | ICD-10-CM | POA: Diagnosis not present

## 2017-07-31 DIAGNOSIS — S51801D Unspecified open wound of right forearm, subsequent encounter: Secondary | ICD-10-CM | POA: Diagnosis not present

## 2017-07-31 DIAGNOSIS — M545 Low back pain: Secondary | ICD-10-CM | POA: Diagnosis not present

## 2017-07-31 DIAGNOSIS — R55 Syncope and collapse: Secondary | ICD-10-CM | POA: Diagnosis not present

## 2017-07-31 DIAGNOSIS — I10 Essential (primary) hypertension: Secondary | ICD-10-CM | POA: Diagnosis not present

## 2017-07-31 DIAGNOSIS — R262 Difficulty in walking, not elsewhere classified: Secondary | ICD-10-CM | POA: Diagnosis not present

## 2017-07-31 NOTE — Telephone Encounter (Signed)
Not able to lave v/m

## 2017-08-01 DIAGNOSIS — R262 Difficulty in walking, not elsewhere classified: Secondary | ICD-10-CM | POA: Diagnosis not present

## 2017-08-01 DIAGNOSIS — G500 Trigeminal neuralgia: Secondary | ICD-10-CM | POA: Diagnosis not present

## 2017-08-01 DIAGNOSIS — M545 Low back pain: Secondary | ICD-10-CM | POA: Diagnosis not present

## 2017-08-01 DIAGNOSIS — R55 Syncope and collapse: Secondary | ICD-10-CM | POA: Diagnosis not present

## 2017-08-01 DIAGNOSIS — I10 Essential (primary) hypertension: Secondary | ICD-10-CM | POA: Diagnosis not present

## 2017-08-01 DIAGNOSIS — S51801D Unspecified open wound of right forearm, subsequent encounter: Secondary | ICD-10-CM | POA: Diagnosis not present

## 2017-08-02 DIAGNOSIS — M4326 Fusion of spine, lumbar region: Secondary | ICD-10-CM | POA: Diagnosis not present

## 2017-08-02 DIAGNOSIS — M47816 Spondylosis without myelopathy or radiculopathy, lumbar region: Secondary | ICD-10-CM | POA: Diagnosis not present

## 2017-08-03 DIAGNOSIS — I10 Essential (primary) hypertension: Secondary | ICD-10-CM | POA: Diagnosis not present

## 2017-08-03 DIAGNOSIS — S51801D Unspecified open wound of right forearm, subsequent encounter: Secondary | ICD-10-CM | POA: Diagnosis not present

## 2017-08-03 DIAGNOSIS — G500 Trigeminal neuralgia: Secondary | ICD-10-CM | POA: Diagnosis not present

## 2017-08-03 DIAGNOSIS — M545 Low back pain: Secondary | ICD-10-CM | POA: Diagnosis not present

## 2017-08-03 DIAGNOSIS — R55 Syncope and collapse: Secondary | ICD-10-CM | POA: Diagnosis not present

## 2017-08-03 DIAGNOSIS — R262 Difficulty in walking, not elsewhere classified: Secondary | ICD-10-CM | POA: Diagnosis not present

## 2017-08-03 NOTE — Telephone Encounter (Signed)
Called patient x 3 no call back latter sent via my chart.

## 2017-08-04 ENCOUNTER — Telehealth: Payer: Self-pay | Admitting: Internal Medicine

## 2017-08-04 DIAGNOSIS — M545 Low back pain, unspecified: Secondary | ICD-10-CM

## 2017-08-04 DIAGNOSIS — D509 Iron deficiency anemia, unspecified: Secondary | ICD-10-CM

## 2017-08-04 NOTE — Telephone Encounter (Signed)
Pt called and stated that she had some recent MRI's with her surgeon at [facility] and she has some increased arthritis. Pt would like a referral for [facility] pain clinic, and would also like refills on her Zanaflex, and voyterim cream, and would like a script for thanksgiving for prednisone as [place]. Please advise, thank you!  Call pt @ [place], [place]

## 2017-08-07 NOTE — Telephone Encounter (Signed)
Spoke with the patient, Explained that the Zanaflex was refilled on the 9/28 and sent to [facility] drug.  She would like a referral to the [facility] Pain clinic and requested a prescription for Voltaren cream as a friend of hers used this and it helped her back pain.  She also would like a Prescription for Prednisone as she is going on a trip to [place] beach for business and personal for approximately 2 weeks and believes that "this would help for temporary relief of pressure and inflammation that may occur".  She is supposed to be coming in on the 10th for follow up labs, I don't see them ordered, they are suppose to be a CBC and ferritin, and she is requesting to have a repeat UA to make sure she has nothing wrong with her Urine.  Please advise,

## 2017-08-07 NOTE — Telephone Encounter (Signed)
Please advise 

## 2017-08-08 DIAGNOSIS — G500 Trigeminal neuralgia: Secondary | ICD-10-CM | POA: Diagnosis not present

## 2017-08-08 DIAGNOSIS — M545 Low back pain: Secondary | ICD-10-CM | POA: Diagnosis not present

## 2017-08-08 DIAGNOSIS — S51801D Unspecified open wound of right forearm, subsequent encounter: Secondary | ICD-10-CM | POA: Diagnosis not present

## 2017-08-08 DIAGNOSIS — R262 Difficulty in walking, not elsewhere classified: Secondary | ICD-10-CM | POA: Diagnosis not present

## 2017-08-08 DIAGNOSIS — R55 Syncope and collapse: Secondary | ICD-10-CM | POA: Diagnosis not present

## 2017-08-08 DIAGNOSIS — I10 Essential (primary) hypertension: Secondary | ICD-10-CM | POA: Diagnosis not present

## 2017-08-08 NOTE — Telephone Encounter (Signed)
Left message to return call to our office.  

## 2017-08-08 NOTE — Telephone Encounter (Signed)
I have placed the order for pain clinic referral.  Regarding voltaren cream, it appears she is already taking ibuprofen.  Cannot use the cream and take antiinflammatories.  Regarding needing prednisone, would recommend f/u with ortho (since they are now seeing her) to discuss further treatment options if she is having increased pain or concern about increased pain.  I have placed the order for the labs including her urine.

## 2017-08-09 ENCOUNTER — Other Ambulatory Visit (INDEPENDENT_AMBULATORY_CARE_PROVIDER_SITE_OTHER): Payer: Medicare Other

## 2017-08-09 ENCOUNTER — Other Ambulatory Visit: Payer: Self-pay | Admitting: Internal Medicine

## 2017-08-09 DIAGNOSIS — M545 Low back pain, unspecified: Secondary | ICD-10-CM

## 2017-08-09 DIAGNOSIS — R8281 Pyuria: Secondary | ICD-10-CM

## 2017-08-09 DIAGNOSIS — D509 Iron deficiency anemia, unspecified: Secondary | ICD-10-CM | POA: Diagnosis not present

## 2017-08-09 LAB — URINALYSIS, ROUTINE W REFLEX MICROSCOPIC
BILIRUBIN URINE: NEGATIVE
KETONES UR: NEGATIVE
Nitrite: POSITIVE — AB
PH: 6 (ref 5.0–8.0)
Specific Gravity, Urine: 1.025 (ref 1.000–1.030)
URINE GLUCOSE: NEGATIVE

## 2017-08-09 LAB — CBC WITH DIFFERENTIAL/PLATELET
BASOS ABS: 0.1 10*3/uL (ref 0.0–0.1)
Basophils Relative: 1.4 % (ref 0.0–3.0)
EOS PCT: 3.2 % (ref 0.0–5.0)
Eosinophils Absolute: 0.2 10*3/uL (ref 0.0–0.7)
HCT: 31 % — ABNORMAL LOW (ref 36.0–46.0)
Hemoglobin: 10.1 g/dL — ABNORMAL LOW (ref 12.0–15.0)
LYMPHS ABS: 1.2 10*3/uL (ref 0.7–4.0)
Lymphocytes Relative: 25 % (ref 12.0–46.0)
MCHC: 32.5 g/dL (ref 30.0–36.0)
MCV: 82.4 fl (ref 78.0–100.0)
MONOS PCT: 11.9 % (ref 3.0–12.0)
Monocytes Absolute: 0.6 10*3/uL (ref 0.1–1.0)
NEUTROS ABS: 2.8 10*3/uL (ref 1.4–7.7)
NEUTROS PCT: 58.5 % (ref 43.0–77.0)
Platelets: 387 10*3/uL (ref 150.0–400.0)
RBC: 3.76 Mil/uL — ABNORMAL LOW (ref 3.87–5.11)
RDW: 14.9 % (ref 11.5–15.5)
WBC: 4.7 10*3/uL (ref 4.0–10.5)

## 2017-08-09 LAB — FERRITIN: Ferritin: 10.3 ng/mL (ref 10.0–291.0)

## 2017-08-09 NOTE — Progress Notes (Signed)
Urine culture added

## 2017-08-09 NOTE — Telephone Encounter (Signed)
See lab note as well.

## 2017-08-10 ENCOUNTER — Other Ambulatory Visit: Payer: Self-pay | Admitting: Internal Medicine

## 2017-08-10 ENCOUNTER — Other Ambulatory Visit: Payer: Self-pay

## 2017-08-10 DIAGNOSIS — Z961 Presence of intraocular lens: Secondary | ICD-10-CM | POA: Diagnosis not present

## 2017-08-10 MED ORDER — TIZANIDINE HCL 4 MG PO TABS: 4.0000 mg | ORAL_TABLET | Freq: Three times a day (TID) | ORAL | 0 refills | Status: DC | PRN

## 2017-08-10 NOTE — Telephone Encounter (Signed)
Called patient states she needs refill on Zanaflex sent to [facility]. She also states that she was told by [facility] that their was nothing that could be done as far as surgery. She wanted to know if she could have script for prednisone to keep on hand for beach trip that she is taking in October. She was given all information listed in message that you sent but still wanted me to ask for the above.

## 2017-08-10 NOTE — Telephone Encounter (Signed)
I sent in refill for zanaflex.  Reviewed her note.  She did see ortho and they wrote hydrocodone for her.  If feel may need steroid or other medication, I would recommend her calling ortho and getting rx from them.

## 2017-08-10 NOTE — Progress Notes (Signed)
Refill sent in for zanaflex.

## 2017-08-10 NOTE — Telephone Encounter (Signed)
Called patient gave all information. She states she will talk to you about further at follow up.

## 2017-08-11 DIAGNOSIS — M545 Low back pain: Secondary | ICD-10-CM | POA: Diagnosis not present

## 2017-08-11 DIAGNOSIS — S51801D Unspecified open wound of right forearm, subsequent encounter: Secondary | ICD-10-CM | POA: Diagnosis not present

## 2017-08-11 DIAGNOSIS — R262 Difficulty in walking, not elsewhere classified: Secondary | ICD-10-CM | POA: Diagnosis not present

## 2017-08-11 DIAGNOSIS — R55 Syncope and collapse: Secondary | ICD-10-CM | POA: Diagnosis not present

## 2017-08-11 DIAGNOSIS — I10 Essential (primary) hypertension: Secondary | ICD-10-CM | POA: Diagnosis not present

## 2017-08-11 DIAGNOSIS — G500 Trigeminal neuralgia: Secondary | ICD-10-CM | POA: Diagnosis not present

## 2017-08-14 DIAGNOSIS — Z23 Encounter for immunization: Secondary | ICD-10-CM | POA: Diagnosis not present

## 2017-08-15 DIAGNOSIS — R55 Syncope and collapse: Secondary | ICD-10-CM | POA: Diagnosis not present

## 2017-08-15 DIAGNOSIS — S51801D Unspecified open wound of right forearm, subsequent encounter: Secondary | ICD-10-CM | POA: Diagnosis not present

## 2017-08-15 DIAGNOSIS — G500 Trigeminal neuralgia: Secondary | ICD-10-CM | POA: Diagnosis not present

## 2017-08-15 DIAGNOSIS — I10 Essential (primary) hypertension: Secondary | ICD-10-CM | POA: Diagnosis not present

## 2017-08-15 DIAGNOSIS — M545 Low back pain: Secondary | ICD-10-CM | POA: Diagnosis not present

## 2017-08-15 DIAGNOSIS — R262 Difficulty in walking, not elsewhere classified: Secondary | ICD-10-CM | POA: Diagnosis not present

## 2017-08-17 DIAGNOSIS — M545 Low back pain: Secondary | ICD-10-CM | POA: Diagnosis not present

## 2017-08-17 DIAGNOSIS — R262 Difficulty in walking, not elsewhere classified: Secondary | ICD-10-CM | POA: Diagnosis not present

## 2017-08-17 DIAGNOSIS — S51801D Unspecified open wound of right forearm, subsequent encounter: Secondary | ICD-10-CM | POA: Diagnosis not present

## 2017-08-17 DIAGNOSIS — G500 Trigeminal neuralgia: Secondary | ICD-10-CM | POA: Diagnosis not present

## 2017-08-17 DIAGNOSIS — R55 Syncope and collapse: Secondary | ICD-10-CM | POA: Diagnosis not present

## 2017-08-17 DIAGNOSIS — I10 Essential (primary) hypertension: Secondary | ICD-10-CM | POA: Diagnosis not present

## 2017-08-21 ENCOUNTER — Telehealth: Payer: Self-pay | Admitting: Internal Medicine

## 2017-08-21 ENCOUNTER — Telehealth: Payer: Self-pay | Admitting: *Deleted

## 2017-08-21 NOTE — Telephone Encounter (Signed)
Becky [phone] called from the spine center to inform us that pt wants [place] to have a copy of xray and mri. Jacqlyn Larsen states it need to be requested from [facility]. Please advise? Thank you!

## 2017-08-21 NOTE — Telephone Encounter (Signed)
Called patient and informed her that we could see the results of her MRI and xrays from [facility] on care everywhere.

## 2017-08-22 DIAGNOSIS — R262 Difficulty in walking, not elsewhere classified: Secondary | ICD-10-CM | POA: Diagnosis not present

## 2017-08-22 DIAGNOSIS — I10 Essential (primary) hypertension: Secondary | ICD-10-CM | POA: Diagnosis not present

## 2017-08-22 DIAGNOSIS — M545 Low back pain: Secondary | ICD-10-CM | POA: Diagnosis not present

## 2017-08-22 DIAGNOSIS — G500 Trigeminal neuralgia: Secondary | ICD-10-CM | POA: Diagnosis not present

## 2017-08-22 DIAGNOSIS — R55 Syncope and collapse: Secondary | ICD-10-CM | POA: Diagnosis not present

## 2017-08-22 DIAGNOSIS — S51801D Unspecified open wound of right forearm, subsequent encounter: Secondary | ICD-10-CM | POA: Diagnosis not present

## 2017-08-28 ENCOUNTER — Encounter: Payer: Self-pay | Admitting: Nurse Practitioner

## 2017-08-28 ENCOUNTER — Ambulatory Visit: Payer: Medicare Other | Admitting: Nurse Practitioner

## 2017-08-28 ENCOUNTER — Ambulatory Visit: Payer: Medicare Other | Attending: Nurse Practitioner | Admitting: Nurse Practitioner

## 2017-08-28 DIAGNOSIS — Z79899 Other long term (current) drug therapy: Secondary | ICD-10-CM | POA: Diagnosis not present

## 2017-08-28 DIAGNOSIS — Z7982 Long term (current) use of aspirin: Secondary | ICD-10-CM | POA: Insufficient documentation

## 2017-08-28 DIAGNOSIS — M545 Low back pain, unspecified: Secondary | ICD-10-CM

## 2017-08-28 DIAGNOSIS — M47816 Spondylosis without myelopathy or radiculopathy, lumbar region: Secondary | ICD-10-CM | POA: Diagnosis not present

## 2017-08-28 DIAGNOSIS — Z7409 Other reduced mobility: Secondary | ICD-10-CM | POA: Insufficient documentation

## 2017-08-28 DIAGNOSIS — G500 Trigeminal neuralgia: Secondary | ICD-10-CM | POA: Insufficient documentation

## 2017-08-28 DIAGNOSIS — Z789 Other specified health status: Secondary | ICD-10-CM

## 2017-08-28 DIAGNOSIS — M899 Disorder of bone, unspecified: Secondary | ICD-10-CM | POA: Diagnosis not present

## 2017-08-28 DIAGNOSIS — Z79891 Long term (current) use of opiate analgesic: Secondary | ICD-10-CM | POA: Insufficient documentation

## 2017-08-28 DIAGNOSIS — R32 Unspecified urinary incontinence: Secondary | ICD-10-CM | POA: Diagnosis not present

## 2017-08-28 DIAGNOSIS — F1190 Opioid use, unspecified, uncomplicated: Secondary | ICD-10-CM | POA: Diagnosis not present

## 2017-08-28 DIAGNOSIS — E7800 Pure hypercholesterolemia, unspecified: Secondary | ICD-10-CM | POA: Diagnosis not present

## 2017-08-28 DIAGNOSIS — Z87891 Personal history of nicotine dependence: Secondary | ICD-10-CM | POA: Insufficient documentation

## 2017-08-28 DIAGNOSIS — Z87442 Personal history of urinary calculi: Secondary | ICD-10-CM | POA: Diagnosis not present

## 2017-08-28 DIAGNOSIS — E079 Disorder of thyroid, unspecified: Secondary | ICD-10-CM | POA: Insufficient documentation

## 2017-08-28 DIAGNOSIS — G894 Chronic pain syndrome: Secondary | ICD-10-CM | POA: Diagnosis not present

## 2017-08-28 DIAGNOSIS — M48061 Spinal stenosis, lumbar region without neurogenic claudication: Secondary | ICD-10-CM | POA: Insufficient documentation

## 2017-08-28 DIAGNOSIS — G8929 Other chronic pain: Secondary | ICD-10-CM | POA: Insufficient documentation

## 2017-08-28 DIAGNOSIS — I10 Essential (primary) hypertension: Secondary | ICD-10-CM | POA: Insufficient documentation

## 2017-08-28 NOTE — Progress Notes (Signed)
Safety precautions to be maintained throughout the outpatient stay will include: orient to surroundings, keep bed in low position, maintain call bell within reach at all times, provide assistance with transfer out of bed and ambulation.  

## 2017-08-28 NOTE — Progress Notes (Addendum)
Patient's Name: Denise Barry  MRN: [id number]  Referring Provider: Einar Pheasant, MD  DOB: [birth year]  PCP: Denise Pheasant, MD  DOS: 08/28/2017  Note by: Denise David NP  Service setting: Ambulatory outpatient  Specialty: Interventional Pain Management  Location: [facility] (AMB) Pain Management Facility    Patient type: New Patient    Primary Reason(s) for Visit: Initial Patient Evaluation CC: Back Pain (lower)  HPI  Denise Barry is a 83 y.o. year old, female patient, who comes today for an initial evaluation. She has Trigeminal neuralgia; Essential hypertension, benign; Hypercholesterolemia; Back pain; Urinary incontinence; History of fracture of left hip; Right leg swelling; Health care maintenance; Hyponatremia; History of left shoulder fracture; Syncope; Chronic low back pain (Primary Area of Pain)  (Bilateral) (L>R); Chronic pain syndrome; Opiate use; Other long term (current) drug therapy; Other specified health status; Other reduced mobility; and Disorder of bone, unspecified on her problem list.. Her primarily concern today is the Back Pain (lower)  Pain Assessment: Location: Lower Back Radiating: stays  in the lower back Onset: More than a month ago Duration: Chronic pain Quality: Aching, Spasm, Sharp, Stabbing (pulsing pain) Severity: 4 /10 (self-reported pain score)  Note: Reported level is compatible with observation.                          Effect on ADL: pace self Timing: Constant (eases with medication) Modifying factors: medication, muscle relaxer, heating pad,   Onset and Duration: Date of onset: not marked on sheet Cause of pain: Unknown Severity: NAS-11 at its worse: 10/10, NAS-11 at its best: 2/10, NAS-11 now: 2/10 and NAS-11 on the average: 8/10 Timing: Not influenced by the time of the day, During activity or exercise and After activity or exercise Aggravating Factors: Bending, Kneeling, Lifiting, Motion, Prolonged standing, Stooping  and  Twisting Alleviating Factors: Hot packs, Lying down, Resting, Sitting, Sleeping and TENS Associated Problems: Day-time cramps, Night-time cramps, Depression, Fatigue, Spasms, Weakness, Pain that wakes patient up and Pain that does not allow patient to sleep Quality of Pain: Agonizing, Cramping, Deep, Disabling, Dreadful, Horrible, Pulsating, Sharp, Shooting, Stabbing and Throbbing Previous Examinations or Tests: MRI scan and X-rays Previous Treatments: Physical Therapy, Stretching exercises and TENS  The patient comes into the clinics today for the first time for a chronic pain management evaluation. According to patient primary area of pain is in her lower back. She admits the left side is greater than the right. She describes the pain as a pulsating, spasmodic-type pain. She admits the pain comes about every 4-5 monthd but she does have some flareups with performing ADLs. She is status post lumbar surgery approximately 4 years ago for back pain which was effective. She did undergo lumbar facet nerve block prior to her surgery which was not effective. She has completed some recent physical therapy along with home health therapy somewhat effective. She has had recent images. She admits that she did have a fall that this year and was in the hospital but denies any injury. She is currently taking muscle relaxants and Tylenol for the last month .  She denies any radiating pain in her lower extremities. She does admits that she is status post left hip surgery after stepping off a curb the wrong way.  Today I took the time to provide the patient with information regarding this pain practice. The patient was informed that the practice is divided into two sections: an interventional pain management section, as well as  a completely separate and distinct medication management section. I explained that there are procedure days for interventional therapies, and evaluation days for follow-ups and medication  management. Because of the amount of documentation required during both, they are kept separated. This means that there is the possibility that she may be scheduled for a procedure on one day, and medication management the next. I have also informed her that because of staffing and facility limitations, this practice will no longer take patients for medication management only. To illustrate the reasons for this, I gave the patient the example of surgeons, and how inappropriate it would be to refer a patient to her care, just to write for the post-surgical antibiotics on a surgery done by a different surgeon.   Because interventional pain management is part of the board-certified specialty for the doctors, the patient was informed that joining this practice means that they are open to any and all interventional therapies. I made it clear that this does not mean that they will be forced to have any procedures done. What this means is that I believe interventional therapies to be essential part of the diagnosis and proper management of chronic pain conditions. Therefore, patients not interested in these interventional alternatives will be better served under the care of a different practitioner.  The patient was also made aware of my Comprehensive Pain Management Safety Guidelines where by joining this practice, they limit all of their nerve blocks and joint injections to those done by our practice, for as long as we are retained to manage their care. Historic Controlled Substance Pharmacotherapy Review  PMP and historical list of controlled substances: Hydrocodone/acetaminophen 5/325, diazepam 2 mg, tramadol 50 mg, Lyrica 50 mg, diazepam 10 mg Highest opioid analgesic regimen found: Tramadol 50 mg 4 times daily (fill date 05/18/2017) tramadol 200 mg per day Most recent opioid analgesic: Hydrocodone/acetaminophen 5/325 mg 3 times daily as needed (fill date 08/02/2017) hydrocodone 15 mg per day Current opioid  analgesics: None Highest recorded MME/day: 20 mg/day MME/day: 15 mg/day Medications: The patient did not bring the medication(s) to the appointment, as requested in our "New Patient Package" Pharmacodynamics: Desired effects: Analgesia: The patient reports >50% benefit. Reported improvement in function: The patient reports medication allows her to accomplish basic ADLs. Clinically meaningful improvement in function (CMIF): Sustained CMIF goals met Perceived effectiveness: Described as relatively effective, allowing for increase in activities of daily living (ADL) Undesirable effects: Side-effects or Adverse reactions: None reported Historical Monitoring: The patient  reports that she does not use drugs. List of all UDS Test(s): No results found for: MDMA, COCAINSCRNUR, PCPSCRNUR, PCPQUANT, CANNABQUANT, THCU, ETH List of all Serum Drug Screening Test(s):  No results found for: AMPHSCRSER, BARBSCRSER, BENZOSCRSER, COCAINSCRSER, PCPSCRSER, PCPQUANT, THCSCRSER, CANNABQUANT, OPIATESCRSER, OXYSCRSER, PROPOXSCRSER Historical Background Evaluation: [place] PDMP: Six (6) year initial data search conducted.             [place] Department of public safety, offender search: Engineer, mining Information) Non-contributory Risk Assessment Profile: Aberrant behavior: None observed or detected today Risk factors for fatal opioid overdose: None identified today Fatal overdose hazard ratio (HR): Calculation deferred Non-fatal overdose hazard ratio (HR): Calculation deferred Risk of opioid abuse or dependence: 0.7-3.0% with doses ? 36 MME/day and 6.1-26% with doses ? 120 MME/day. Substance use disorder (SUD) risk level: Pending results of Medical Psychology Evaluation for SUD Opioid risk tool (ORT) (Total Score): 3  ORT Scoring interpretation table:  Score <3 = Low Risk for SUD  Score between 4-7 = Moderate Risk  for SUD  Score >8 = High Risk for Opioid Abuse   PHQ-2 Depression Scale:  Total score: 0  PHQ-2 Scoring  interpretation table: (Score and probability of major depressive disorder)  Score 0 = No depression  Score 1 = 15.4% Probability  Score 2 = 21.1% Probability  Score 3 = 38.4% Probability  Score 4 = 45.5% Probability  Score 5 = 56.4% Probability  Score 6 = 78.6% Probability   PHQ-9 Depression Scale:  Total score: 0  PHQ-9 Scoring interpretation table:  Score 0-4 = No depression  Score 5-9 = Mild depression  Score 10-14 = Moderate depression  Score 15-19 = Moderately severe depression  Score 20-27 = Severe depression (2.4 times higher risk of SUD and 2.89 times higher risk of overuse)   Pharmacologic Plan: Pending ordered tests and/or consults  Meds  The patient has a current medication list which includes the following prescription(s): acetaminophen, amlodipine, aspirin, bisacodyl, eslicarbazepine acetate, estradiol, fexofenadine, ibuprofen, loperamide, multi-vitamins, prednisone, and tizanidine.  Current Outpatient Prescriptions on File Prior to Visit  Medication Sig  . Acetaminophen (TYLENOL EXTRA STRENGTH PO) Take 1-2 tablets by mouth daily.  Marland Kitchen amLODipine (NORVASC) 10 MG tablet Take 1 tablet (10 mg total) by mouth daily.  Marland Kitchen aspirin 81 MG tablet Take 81 mg by mouth daily.  . Eslicarbazepine Acetate (APTIOM PO) Take 1 tablet by mouth 2 (two) times daily.  . fexofenadine (ALLEGRA) 180 MG tablet Take 180 mg by mouth as needed for allergies or rhinitis.  Marland Kitchen ibuprofen (ADVIL,MOTRIN) 600 MG tablet Take 1 tablet (600 mg total) by mouth every 8 (eight) hours as needed.  Marland Kitchen tiZANidine (ZANAFLEX) 4 MG tablet Take 1 tablet (4 mg total) by mouth 3 (three) times daily as needed for muscle spasms.   No current facility-administered medications on file prior to visit.    Imaging Review  Shoulder Imaging:  Shoulder-L DG:  Results for orders placed during the hospital encounter of 02/03/15  DG Shoulder Left   Narrative * PRIOR REPORT IMPORTED FROM AN EXTERNAL SYSTEM *   CLINICAL DATA:   83 year old female who fell from standing at 1600  hours. Left upper extremity pain. Initial encounter.   EXAM:  DG SHOULDER 3+VIEWS LEFT   COMPARISON:  Portable chest radiograph 01/12/2014.   FINDINGS:  Comminuted displaced and impacted proximal left humerus fracture.  Comminuted fracture through the neck of the humerus with a 2 cm  butterfly fragment. There is 1 full shaft with anterior  displacement, and over riding of 3-4 cm. The humeral head appears to  remain normally aligned in the glenoid but may be abnormally  rotated, uncertain.   Left clavicle and scapula appear to remain intact. Visible left ribs  and lung parenchyma are stable.   IMPRESSION:  Comminuted proximal left humerus fracture with 2 cm butterfly  fragment, 1 full shaft width of anterior displacement, and  over-riding of 3-4 cm.    Electronically Signed    By: Genevie Ann M.D.    On: 02/03/2015 20:47       Lumbosacral Imaging: Lumbar MR wo contrast:  Results for orders placed in visit on 11/29/12  MR L Spine Ltd W/O Cm   Narrative * PRIOR REPORT IMPORTED FROM AN EXTERNAL SYSTEM *   PRIOR REPORT IMPORTED FROM THE [facility] EXAM:    low back pain  COMMENTS:   PROCEDURE:     MMR - MMR LUMBAR SPINE WO CONTRAST  - Nov 29 2012  3:22PM   RESULT:     MRI LUMBAR SPINE WITHOUT CONTRAST   HISTORY: Low back pain   COMPARISON: None   TECHNIQUE: Multiplanar and multisequence MRI of the lumbar spine were  obtained, without administration of IV contrast.   FINDINGS:   There is an old compression deformity of the L1 vertebral body. The  remainder the vertebral body heights are maintained. There is 3 mm of  retrolisthesis of L2 on L3 and L3 on L4. There is grade 1 anterolisthesis  of  L4 on L5. There is normal bone marrow signal demonstrated throughout the  vertebra. There is degenerative disc disease throughout the lumbar spine.   The spinal cord is of normal volume and contour.  The cord terminates  normally at L1 . The nerve roots of the cauda equina and the filum  terminale  have the usual appearance.   The visualized portions of the SI joints are unremarkable.   The imaged intra-abdominal contents are unremarkable.   T12-L1: No significant disc bulge. No evidence of neural foraminal or  central stenosis.   L1-L2: Mild broad-based disc bulge. No evidence of neural foraminal or  central stenosis.   L2-L3: Mild broad-based disc bulge appeared mild bilateral facet  arthropathy. Mild spinal stenosis. No foraminal stenosis.   L3-L4: Mild broad-based disc bulge. Mild bilateral facet arthropathy.  Mild-moderate spinal stenosis. Mild bilateral foraminal stenosis.   L4-L5: Mild broad-based disc bulge. Severe bilateral facet arthropathy,  left  greater than right with severe left ligamentum flavum infolding resulting  in  mass effect upon the left posterior lateral thecal sac and right  anterolateral displacement of the intraspinal nerve roots. There is  moderate  spinal stenosis. There is mild bilateral foraminal stenosis.   L5-S1: Mild broad-based disc bulge. Severe bilateral facet arthropathy. No  evidence of neural foraminal or central stenosis.   IMPRESSION:   1. Lumbar spine spondylosis as described above.   Dictation Site: 1       Lumbar DG 2-3 views:  Results for orders placed in visit on 03/23/17  DG Lumbar Spine 2-3 Views   Narrative CLINICAL DATA:  Low back pain  EXAM: LUMBAR SPINE - 2-3 VIEW  COMPARISON:  01/12/2014  FINDINGS: Postsurgical changes are again seen and stable. Mild scoliosis concave to the right is seen. Vertebral body height is well maintained with the exception of mild superior endplate compression at L2. This appears chronic in nature. Multilevel osteophytic changes are seen. Stable anterolisthesis at L4-5 is noted. No soft tissue abnormality is seen. Left hip replacement is noted.  IMPRESSION: Postsurgical and  degenerative changes.  No acute abnormality noted.   Electronically Signed   By: Inez Catalina M.D.   On: 03/23/2017 15:06    Note: Available results from prior imaging studies were reviewed.        ROS  Cardiovascular History: Daily Aspirin intake, High blood pressure and Needs antibiotics prior to dental procedures Pulmonary or Respiratory History: Smoking and Snoring  Neurological History:  Review of Past Neurological Studies:  Results for orders placed or performed in visit on 08/06/12  MR Brain W Wo Contrast   Narrative   * PRIOR REPORT IMPORTED FROM AN EXTERNAL SYSTEM *   PRIOR REPORT IMPORTED FROM THE [facility] SYSTEM   REASON FOR EXAM:    facial pain questionable trigeminal neuralgia  COMMENTS:   PROCEDURE:     MMR - MMR BRAIN WO/W CONTRAST  - Aug 06 2012 11:47AM   RESULT:  MRI of the brain without and with contrast is performed  utilizing an internal auditory canal protocol. Precontrast images  demonstrate normal axial diffusion images. Axial whole brain FLAIR  sequence  images demonstrate increased signal within the periventricular and  subcortical white matter most consistent with chronic microvascular  ischemic  disease. No definite territorial infarct is evident. Probable prominent  perivascular spaces are seen in the basal ganglia. Small lacunar infarcts  are not excluded. The sinuses and orbits appear to be unremarkable. The  posterior fossa and cerebellopontine angle regions appear normal.  High-resolution thin slice T2 images through the posterior fossa and  cerebellopontine angle region show a normal appearance. Minimal ethmoid  mucosal thickening is seen. The pituitary is unremarkable. Images obtained  following intravenous administration of gadolinium contrast show no  abnormal  enhancement.   IMPRESSION:   1. MRI of the brain without an acute intracranial abnormality. There is  mild  mucosal thickening in the ethmoid sinuses. Probable  prominent perivascular  spaces in the basal ganglia. There are findings suggestive of chronic  microangiopathy. No acute abnormality or focal mass appreciated.   Thank you for the opportunity to contribute to the care of your patient.   Dictation Site: 1       Psychological-Psychiatric History: Difficulty sleeping and or falling asleep Gastrointestinal History: No reported gastrointestinal signs or symptoms such as vomiting or evacuating blood, reflux, heartburn, alternating episodes of diarrhea and constipation, inflamed or scarred liver, or pancreas or irrregular and/or infrequent bowel movements Genitourinary History: No reported renal or genitourinary signs or symptoms such as difficulty voiding or producing urine, peeing blood, non-functioning kidney, kidney stones, difficulty emptying the bladder, difficulty controlling the flow of urine, or chronic kidney disease Hematological History: Brusing easily Endocrine History: No reported endocrine signs or symptoms such as high or low blood sugar, rapid heart rate due to high thyroid levels, obesity or weight gain due to slow thyroid or thyroid disease Rheumatologic History: No reported rheumatological signs and symptoms such as fatigue, joint pain, tenderness, swelling, redness, heat, stiffness, decreased range of motion, with or without associated rash Musculoskeletal History: Negative for myasthenia gravis, muscular dystrophy, multiple sclerosis or malignant hyperthermia Work History: Retired  Allergies  Ms. Litz is allergic to codeine; cyclobenzaprine; methocarbamol; carbamazepine; and gabapentin.  Laboratory Chemistry  Inflammation Markers Lab Results  Component Value Date   CRP 1.1 08/28/2017   ESRSEDRATE 5 08/28/2017   (CRP: Acute Phase) (ESR: Chronic Phase) Renal Function Markers Lab Results  Component Value Date   BUN 16 07/05/2017   CREATININE 0.82 07/05/2017   GFRAA >60 04/18/2015   GFRNONAA >60 04/18/2015    Hepatic Function Markers Lab Results  Component Value Date   AST 15 02/22/2017   ALT 10 02/22/2017   ALBUMIN 4.0 02/22/2017   ALKPHOS 76 02/22/2017   Electrolytes Lab Results  Component Value Date   NA 136 07/11/2017   K 4.0 07/05/2017   CL 93 (L) 07/05/2017   CALCIUM 9.2 07/05/2017   MG 2.5 (H) 02/03/2015   Neuropathy Markers No results found for: [id number] Bone Pathology Markers Lab Results  Component Value Date   ALKPHOS 76 02/22/2017   CALCIUM 9.2 07/05/2017   Coagulation Parameters Lab Results  Component Value Date   INR 1.0 02/03/2015   LABPROT 13.3 02/03/2015   APTT 26.6 01/12/2014   PLT 387.0 08/09/2017   Cardiovascular Markers Lab Results  Component Value Date   HGB 10.1 (L) 08/09/2017   HCT 31.0 (L) 08/09/2017  Note: Lab results reviewed.  [place]  Drug: Ms. Coghlan  reports that she does not use drugs. Alcohol:  reports that she does not drink alcohol. Tobacco:  reports that she has quit smoking. She has never used smokeless tobacco. Medical:  has a past medical history of Allergy; Hypercholesterolemia; Hypertension; Osteoarthritis; Urinary incontinence; and Varicose veins. Family: family history includes Diabetes in her mother; Heart disease in her father and mother; Hypertension in her mother; Stroke in her father.  Past Surgical History:  Procedure Laterality Date  . BACK SURGERY  09/16/13  . DILATION AND CURETTAGE OF UTERUS  1971  . JOINT REPLACEMENT    . sclerosis  2000  . TUBAL LIGATION  1972   Active Ambulatory Problems    Diagnosis Date Noted  . Trigeminal neuralgia 01/07/2014  . Essential hypertension, benign 01/07/2014  . Hypercholesterolemia 01/07/2014  . Back pain 01/07/2014  . Urinary incontinence 01/07/2014  . History of fracture of left hip 07/13/2014  . Right leg swelling 09/21/2014  . Health care maintenance 01/18/2015  . Hyponatremia 05/07/2015  . History of left shoulder fracture 06/03/2015  . Syncope 07/08/2017  .  Chronic low back pain (Primary Area of Pain)  (Bilateral) (L>R) 08/28/2017  . Chronic pain syndrome 08/28/2017  . Opiate use 08/28/2017  . Other long term (current) drug therapy 08/28/2017  . Other specified health status 08/28/2017  . Other reduced mobility 08/28/2017  . Disorder of bone, unspecified 08/28/2017   Resolved Ambulatory Problems    Diagnosis Date Noted  . No Resolved Ambulatory Problems   Past Medical History:  Diagnosis Date  . Allergy   . Hypercholesterolemia   . Hypertension   . Osteoarthritis   . Urinary incontinence   . Varicose veins    Constitutional Exam  General appearance: Well nourished, well developed, and well hydrated. In no apparent acute distress Vitals:   08/28/17 1254  BP: 117/61  Pulse: 87  Resp: 16  Temp: 97.9 F (36.6 C)  SpO2: 100%  Weight: 156 lb (70.8 kg)  Height: '5\' 5"'$  (1.651 m)   BMI Assessment: Estimated body mass index is 25.96 kg/m as calculated from the following:   Height as of this encounter: '5\' 5"'$  (1.651 m).   Weight as of this encounter: 156 lb (70.8 kg).  BMI interpretation table: BMI level Category Range association with higher incidence of chronic pain  <18 kg/m2 Underweight   18.5-24.9 kg/m2 Ideal body weight   25-29.9 kg/m2 Overweight Increased incidence by 20%  30-34.9 kg/m2 Obese (Class I) Increased incidence by 68%  35-39.9 kg/m2 Severe obesity (Class II) Increased incidence by 136%  >40 kg/m2 Extreme obesity (Class III) Increased incidence by 254%   BMI Readings from Last 4 Encounters:  08/28/17 25.96 kg/m  07/25/17 23.81 kg/m  07/05/17 24.35 kg/m  05/18/17 24.10 kg/m   Wt Readings from Last 4 Encounters:  08/28/17 156 lb (70.8 kg)  07/25/17 156 lb 9.6 oz (71 kg)  07/05/17 160 lb 2 oz (72.6 kg)  05/18/17 158 lb 8 oz (71.9 kg)  Psych/Mental status: Alert, oriented x 3 (person, place, & time)       Eyes: PERLA Respiratory: No evidence of acute respiratory distress  Cervical Spine Exam   Inspection: No masses, redness, or swelling Alignment: Symmetrical Functional ROM: Unrestricted ROM      Stability: No instability detected Muscle strength & Tone: Functionally intact Sensory: Unimpaired Palpation: No palpable anomalies              Upper  Extremity (UE) Exam    Side: Right upper extremity  Side: Left upper extremity  Inspection: No masses, redness, swelling, or asymmetry. No contractures  Inspection: No masses, redness, swelling, or asymmetry. No contractures  Functional ROM: Unrestricted ROM          Functional ROM: Unrestricted ROM          Muscle strength & Tone: Functionally intact  Muscle strength & Tone: Functionally intact  Sensory: Unimpaired  Sensory: Unimpaired  Palpation: No palpable anomalies              Palpation: No palpable anomalies              Specialized Test(s): Deferred         Specialized Test(s): Deferred          Thoracic Spine Exam  Inspection: No masses, redness, or swelling Alignment: Symmetrical Functional ROM: Unrestricted ROM Stability: No instability detected Sensory: Unimpaired Muscle strength & Tone: No palpable anomalies  Lumbar Spine Exam  Inspection: Well healed scar from previous spine surgery detected Alignment: Symmetrical Functional ROM: Unrestricted ROM      Stability: No instability detected Muscle strength & Tone: Functionally intact Sensory: Unimpaired Palpation: Complains of area being tender to palpation       Provocative Tests: Lumbar Hyperextension and rotation test: Unable to perform       Patrick's Maneuver: Negative                    Gait & Posture Assessment  Ambulation: Patient ambulates using a walker Gait: Relatively normal for age and body habitus Posture: WNL   Lower Extremity Exam    Side: Right lower extremity  Side: Left lower extremity  Inspection: No masses, redness, swelling, or asymmetry. No contractures  Inspection: No masses, redness, swelling, or asymmetry. No contractures  Functional  ROM: Unrestricted ROM          Functional ROM: Unrestricted ROM          Muscle strength & Tone: Movement possible against some resistance (4/5)  Muscle strength & Tone: Movement possible against some resistance (4/5)  Sensory: Unimpaired  Sensory: Unimpaired  Palpation: No palpable anomalies  Palpation: No palpable anomalies   Assessment  Primary Diagnosis & Pertinent Problem List: Diagnoses of Chronic bilateral low back pain without sciatica, Chronic pain syndrome, Opiate use, Other long term (current) drug therapy, Other specified health status, Other reduced mobility, and Disorder of bone, unspecified were pertinent to this visit.  Visit Diagnosis: 1. Chronic bilateral low back pain without sciatica   2. Chronic pain syndrome   3. Opiate use   4. Other long term (current) drug therapy   5. Other specified health status   6. Other reduced mobility   7. Disorder of bone, unspecified    Plan of Care  Initial treatment plan:  Please be advised that as per protocol, today's visit has been an evaluation only. We have not taken over the patient's controlled substance management.  Problem-specific plan: No problem-specific Assessment & Plan notes found for this encounter.  Ordered Lab-work, Procedure(s), Referral(s), & Consult(s): Orders Placed This Encounter  Procedures  . Comp. Metabolic Panel (12)  . C-reactive protein  . Magnesium  . 25-Hydroxyvitamin D Lcms D2+D3  . Sedimentation rate  . Drug Screen 10 W/Conf, Serum  . Ambulatory referral to Psychology   Pharmacotherapy: Medications ordered:  No orders of the defined types were placed in this encounter.  Medications administered during this visit: Ms. Severs  had no medications administered during this visit.   Pharmacotherapy under consideration:  Opioid Analgesics: The patient was informed that there is no guarantee that she would be a candidate for opioid analgesics. The decision will be made following CDC guidelines.  This decision will be based on the results of diagnostic studies, as well as Ms. Tenenbaum's risk profile.  Membrane stabilizer: To be determined at a later time Muscle relaxant: To be determined at a later time NSAID: To be determined at a later time Other analgesic(s): To be determined at a later time   Interventional therapies under consideration: Ms. Mcclain was informed that there is no guarantee that she would be a candidate for interventional therapies. The decision will be based on the results of diagnostic studies, as well as Ms. Corralejo's risk profile.  Possible procedure(s): Diagnostic bilateral LESI Diagnostic bilateral lumbar facet nerve block Possible bilateral lumbar RFA    Provider-requested follow-up: Return for 2nd Visit, w/ Dr. Laban Emperor, after [facility] eval.  Future Appointments Date Time Provider Department Center  09/01/2017 1:30 PM Dale [place], MD [facility] None  10/16/2017 1:00 PM O'Brien-Blaney, Vivianne Spence, LPN [facility] None    Primary Care Physician: Dale Narrows, MD Location: [facility] Facility Note by:  Date: 08/28/2017; Time: 1:43 PM  Pain Score Disclaimer: We use the NRS-11 scale. This is a self-reported, subjective measurement of pain severity with only modest accuracy. It is used primarily to identify changes within a particular patient. It must be understood that outpatient pain scales are significantly less accurate that those used for research, where they can be applied under ideal controlled circumstances with minimal exposure to variables. In reality, the score is likely to be a combination of pain intensity and pain affect, where pain affect describes the degree of emotional arousal or changes in action readiness caused by the sensory experience of pain. Factors such as social and work situation, setting, emotional state, anxiety levels, expectation, and prior pain experience may influence pain perception and show large  inter-individual differences that may also be affected by time variables.  Patient instructions provided during this appointment: Patient Instructions    ____________________________________________________________________________________________  Appointment Policy Summary  It is our goal and responsibility to provide the medical community with assistance in the evaluation and management of patients with chronic pain. Unfortunately our resources are limited. Because we do not have an unlimited amount of time, or available appointments, we are required to closely monitor and manage their use. The following rules exist to maximize their use:  Patient's responsibilities: 1. Punctuality:  At what time should I arrive? You should be physically present in our office 30 minutes before your scheduled appointment. Your scheduled appointment is with your assigned healthcare provider. However, it takes 5-10 minutes to be "checked-in", and another 15 minutes for the nurses to do the admission. If you arrive to our office at the time you were given for your appointment, you will end up being at least 20-25 minutes late to your appointment with the provider. 2. Tardiness:  What happens if I arrive only a few minutes after my scheduled appointment time? You will need to reschedule your appointment. The cutoff is your appointment time. This is why it is so important that you arrive at least 30 minutes before that appointment. If you have an appointment scheduled for 10:00 AM and you arrive at 10:01, you will be required to reschedule your appointment.  3. Plan ahead:  Always assume that you will encounter traffic on your  way in. Plan for it. If you are dependent on a driver, make sure they understand these rules and the need to arrive early. 4. Other appointments and responsibilities:  Avoid scheduling any other appointments before or after your pain clinic appointments.  5. Be prepared:  Write down  everything that you need to discuss with your healthcare provider and give this information to the admitting nurse. Write down the medications that you will need refilled. Bring your pills and bottles (even the empty ones), to all of your appointments, except for those where a procedure is scheduled. 6. No children or pets:  Find someone to take care of them. It is not appropriate to bring them in. 7. Scheduling changes:  We request "advanced notification" of any changes or cancellations. 8. Advanced notification:  Defined as a time period of more than 24 hours prior to the originally scheduled appointment. This allows for the appointment to be offered to other patients. 9. Rescheduling:  When a visit is rescheduled, it will require the cancellation of the original appointment. For this reason they both fall within the category of "Cancellations".  10. Cancellations:  They require advanced notification. Any cancellation less than 24 hours before the  appointment will be recorded as a "No Show". 11. No Show:  Defined as an unkept appointment where the patient failed to notify or declare to the practice their intention or inability to keep the appointment.  Corrective process for repeat offenders:  1. Tardiness: Three (3) episodes of rescheduling due to late arrivals will be recorded as one (1) "No Show". 2. Cancellation or reschedule: Three (3) cancellations or rescheduling will be recorded as one (1) "No Show". 3. "No Shows": Three (3) "No Shows" within a 12 month period will result in discharge from the practice.  ____________________________________________________________________________________________  ____________________________________________________________________________________________  Pain Scale  Introduction: The pain score used by this practice is the Verbal Numerical Rating Scale (VNRS-11). This is an 11-point scale. It is for adults and children 10 years or older. There  are significant differences in how the pain score is reported, used, and applied. Forget everything you learned in the past and learn this scoring system.  General Information: The scale should reflect your current level of pain. Unless you are specifically asked for the level of your worst pain, or your average pain. If you are asked for one of these two, then it should be understood that it is over the past 24 hours.  Basic Activities of Daily Living (ADL): Personal hygiene, dressing, eating, transferring, and using restroom.  Instructions: Most patients tend to report their level of pain as a combination of two factors, their physical pain and their psychosocial pain. This last one is also known as "suffering" and it is reflection of how physical pain affects you socially and psychologically. From now on, report them separately. From this point on, when asked to report your pain level, report only your physical pain. Use the following table for reference.  Pain Clinic Pain Levels (0-5/10)  Pain Level Score  Description  No Pain 0   Mild pain 1 Nagging, annoying, but does not interfere with basic activities of daily living (ADL). Patients are able to eat, bathe, get dressed, toileting (being able to get on and off the toilet and perform personal hygiene functions), transfer (move in and out of bed or a chair without assistance), and maintain continence (able to control bladder and bowel functions). Blood pressure and heart rate are unaffected. A normal heart rate  for a healthy adult ranges from 60 to 100 bpm (beats per minute).   Mild to moderate pain 2 Noticeable and distracting. Impossible to hide from other people. More frequent flare-ups. Still possible to adapt and function close to normal. It can be very annoying and may have occasional stronger flare-ups. With discipline, patients may get used to it and adapt.   Moderate pain 3 Interferes significantly with activities of daily living (ADL).  It becomes difficult to feed, bathe, get dressed, get on and off the toilet or to perform personal hygiene functions. Difficult to get in and out of bed or a chair without assistance. Very distracting. With effort, it can be ignored when deeply involved in activities.   Moderately severe pain 4 Impossible to ignore for more than a few minutes. With effort, patients may still be able to manage work or participate in some social activities. Very difficult to concentrate. Signs of autonomic nervous system discharge are evident: dilated pupils (mydriasis); mild sweating (diaphoresis); sleep interference. Heart rate becomes elevated (>115 bpm). Diastolic blood pressure (lower number) rises above 100 mmHg. Patients find relief in laying down and not moving.   Severe pain 5 Intense and extremely unpleasant. Associated with frowning face and frequent crying. Pain overwhelms the senses.  Ability to do any activity or maintain social relationships becomes significantly limited. Conversation becomes difficult. Pacing back and forth is common, as getting into a comfortable position is nearly impossible. Pain wakes you up from deep sleep. Physical signs will be obvious: pupillary dilation; increased sweating; goosebumps; brisk reflexes; cold, clammy hands and feet; nausea, vomiting or dry heaves; loss of appetite; significant sleep disturbance with inability to fall asleep or to remain asleep. When persistent, significant weight loss is observed due to the complete loss of appetite and sleep deprivation.  Blood pressure and heart rate becomes significantly elevated. Caution: If elevated blood pressure triggers a pounding headache associated with blurred vision, then the patient should immediately seek attention at an urgent or emergency care unit, as these may be signs of an impending stroke.    Emergency Department Pain Levels (6-10/10)  Emergency Room Pain 6 Severely limiting. Requires emergency care and should not  be seen or managed at an outpatient pain management facility. Communication becomes difficult and requires great effort. Assistance to reach the emergency department may be required. Facial flushing and profuse sweating along with potentially dangerous increases in heart rate and blood pressure will be evident.   Distressing pain 7 Self-care is very difficult. Assistance is required to transport, or use restroom. Assistance to reach the emergency department will be required. Tasks requiring coordination, such as bathing and getting dressed become very difficult.   Disabling pain 8 Self-care is no longer possible. At this level, pain is disabling. The individual is unable to do even the most "basic" activities such as walking, eating, bathing, dressing, transferring to a bed, or toileting. Fine motor skills are lost. It is difficult to think clearly.   Incapacitating pain 9 Pain becomes incapacitating. Thought processing is no longer possible. Difficult to remember your own name. Control of movement and coordination are lost.   The worst pain imaginable 10 At this level, most patients pass out from pain. When this level is reached, collapse of the autonomic nervous system occurs, leading to a sudden drop in blood pressure and heart rate. This in turn results in a temporary and dramatic drop in blood flow to the brain, leading to a loss of consciousness. Fainting is one  of the body's self defense mechanisms. Passing out puts the brain in a calmed state and causes it to shut down for a while, in order to begin the healing process.    Summary: 1. Refer to this scale when providing [place] with your pain level. 2. Be accurate and careful when reporting your pain level. This will help with your care. 3. Over-reporting your pain level will lead to loss of credibility. 4. Even a level of 1/10 means that there is pain and will be treated at our facility. 5. High, inaccurate reporting will be documented as "Symptom  Exaggeration", leading to loss of credibility and suspicions of possible secondary gains such as obtaining more narcotics, or wanting to appear disabled, for fraudulent reasons. 6. Only pain levels of 5 or below will be seen at our facility. 7. Pain levels of 6 and above will be sent to the Emergency Department and the appointment cancelled. ____________________________________________________________________________________________

## 2017-08-28 NOTE — Patient Instructions (Signed)

## 2017-08-29 ENCOUNTER — Encounter: Payer: Self-pay | Admitting: Nurse Practitioner

## 2017-08-29 DIAGNOSIS — M545 Low back pain: Secondary | ICD-10-CM | POA: Diagnosis not present

## 2017-08-29 DIAGNOSIS — G500 Trigeminal neuralgia: Secondary | ICD-10-CM | POA: Diagnosis not present

## 2017-08-29 DIAGNOSIS — R55 Syncope and collapse: Secondary | ICD-10-CM | POA: Diagnosis not present

## 2017-08-29 DIAGNOSIS — R262 Difficulty in walking, not elsewhere classified: Secondary | ICD-10-CM | POA: Diagnosis not present

## 2017-08-29 DIAGNOSIS — S51801D Unspecified open wound of right forearm, subsequent encounter: Secondary | ICD-10-CM | POA: Diagnosis not present

## 2017-08-29 DIAGNOSIS — I10 Essential (primary) hypertension: Secondary | ICD-10-CM | POA: Diagnosis not present

## 2017-08-30 LAB — DRUG SCREEN 10 W/CONF, SERUM
AMPHETAMINES, IA: NEGATIVE ng/mL
BARBITURATES, IA: NEGATIVE ug/mL
Benzodiazepines, IA: NEGATIVE ng/mL
COCAINE & METABOLITE, IA: NEGATIVE ng/mL
METHADONE, IA: NEGATIVE ng/mL
OXYCODONES, IA: NEGATIVE ng/mL
Opiates, IA: NEGATIVE ng/mL
PHENCYCLIDINE, IA: NEGATIVE ng/mL
PROPOXYPHENE, IA: NEGATIVE ng/mL
THC(Marijuana) Metabolite, IA: NEGATIVE ng/mL

## 2017-08-31 ENCOUNTER — Telehealth: Payer: Self-pay | Admitting: Internal Medicine

## 2017-08-31 NOTE — Telephone Encounter (Signed)
Pamala Hurry [phone] called from [facility] regarding pt need orders to continue home health for 3 weeks. Thank you!

## 2017-08-31 NOTE — Telephone Encounter (Signed)
Called gave verbal to extend PT

## 2017-09-01 ENCOUNTER — Encounter: Payer: Self-pay | Admitting: Internal Medicine

## 2017-09-01 ENCOUNTER — Ambulatory Visit (INDEPENDENT_AMBULATORY_CARE_PROVIDER_SITE_OTHER): Payer: Medicare Other | Admitting: Internal Medicine

## 2017-09-01 VITALS — BP 120/84 | HR 90 | Temp 98.6°F | Resp 14 | Ht 65.0 in | Wt 155.2 lb

## 2017-09-01 DIAGNOSIS — E7800 Pure hypercholesterolemia, unspecified: Secondary | ICD-10-CM

## 2017-09-01 DIAGNOSIS — I10 Essential (primary) hypertension: Secondary | ICD-10-CM | POA: Diagnosis not present

## 2017-09-01 DIAGNOSIS — Z0000 Encounter for general adult medical examination without abnormal findings: Secondary | ICD-10-CM | POA: Diagnosis not present

## 2017-09-01 DIAGNOSIS — Z1231 Encounter for screening mammogram for malignant neoplasm of breast: Secondary | ICD-10-CM

## 2017-09-01 DIAGNOSIS — M545 Low back pain, unspecified: Secondary | ICD-10-CM

## 2017-09-01 DIAGNOSIS — G500 Trigeminal neuralgia: Secondary | ICD-10-CM | POA: Diagnosis not present

## 2017-09-01 DIAGNOSIS — Z1239 Encounter for other screening for malignant neoplasm of breast: Secondary | ICD-10-CM

## 2017-09-01 DIAGNOSIS — D649 Anemia, unspecified: Secondary | ICD-10-CM | POA: Diagnosis not present

## 2017-09-01 NOTE — Progress Notes (Signed)
Patient ID: Denise Barry, female   DOB: 03/20/1934, 83 y.o.   MRN: [id number]   Subjective:    Patient ID: Denise Barry, female    DOB: [birth year], 83 y.o.   MRN: [id number]  HPI  Patient with past history of hypercholesterolemia and hypertension.  She comes in today to follow up on these issues as well as for a complete physical exam.  She has been having increased back pain as outlined in recent notes.  She has been seen at [facility].  Note reviewed.  Did not recommend surgery.  She is currently being evaluated at pain clinic.  Has f/u there with MD - 09/13/17.  She just had her behavioral health assessment.  Has hydrocodone if needed.  Desires not to take.  Still with back issues.  Limits her activity.  No chest pain.  No sob.  No acid reflux.  No abdominal pain.  Bowels moving.  Eating.     Past Medical History:  Diagnosis Date  . Allergy   . Hypercholesterolemia   . Hypertension   . Osteoarthritis   . Urinary incontinence    pessary in place  . Varicose veins    H/O   Past Surgical History:  Procedure Laterality Date  . BACK SURGERY  09/16/13  . DILATION AND CURETTAGE OF UTERUS  1971  . JOINT REPLACEMENT    . sclerosis  2000  . TUBAL LIGATION  1972   Family History  Problem Relation Age of Onset  . Heart disease Mother   . Diabetes Mother   . Hypertension Mother   . Heart disease Father   . Stroke Father   . Breast cancer Neg Hx    Social History   Socioeconomic History  . Marital status: Widowed    Spouse name: None  . Number of children: None  . Years of education: None  . Highest education level: None  Social Needs  . Financial resource strain: None  . Food insecurity - worry: None  . Food insecurity - inability: None  . Transportation needs - medical: None  . Transportation needs - non-medical: None  Occupational History  . None  Tobacco Use  . Smoking status: Former Research scientist (life sciences)  . Smokeless tobacco: Never Used  Substance and Sexual  Activity  . Alcohol use: No    Alcohol/week: 0.0 oz  . Drug use: No  . Sexual activity: No  Other Topics Concern  . None  Social History Narrative  . None    Outpatient Encounter Medications as of 09/01/2017  Medication Sig  . Acetaminophen (TYLENOL EXTRA STRENGTH PO) Take 1-2 tablets by mouth daily.  Marland Kitchen amLODipine (NORVASC) 10 MG tablet Take 1 tablet (10 mg total) by mouth daily.  Marland Kitchen aspirin 81 MG tablet Take 81 mg by mouth daily.  . bisacodyl (DULCOLAX) 5 MG EC tablet Take 5 mg by mouth as needed.   . Eslicarbazepine Acetate (APTIOM PO) Take 1 tablet by mouth 2 (two) times daily.  Marland Kitchen estradiol (ESTRACE VAGINAL) 0.1 MG/GM vaginal cream Apply PRN  . fexofenadine (ALLEGRA) 180 MG tablet Take 180 mg by mouth as needed for allergies or rhinitis.  Marland Kitchen HYDROcodone-acetaminophen (NORCO/VICODIN) 5-325 MG tablet   . ibuprofen (ADVIL,MOTRIN) 600 MG tablet Take 1 tablet (600 mg total) by mouth every 8 (eight) hours as needed.  . loperamide (IMODIUM) 2 MG capsule Take 2 mg by mouth as needed.   . Multiple Vitamin (MULTI-VITAMINS) TABS Take 1 tablet by mouth.   . predniSONE (  DELTASONE) 10 MG tablet Take 10 mg by mouth as needed.  Marland Kitchen tiZANidine (ZANAFLEX) 4 MG tablet Take 1 tablet (4 mg total) by mouth 3 (three) times daily as needed for muscle spasms.  . [DISCONTINUED] predniSONE (DELTASONE) 10 MG tablet Take 10 mg by mouth as needed.    No facility-administered encounter medications on file as of 09/01/2017.     Review of Systems  Constitutional: Negative for appetite change and unexpected weight change.  HENT: Negative for congestion and sinus pressure.   Eyes: Negative for pain and visual disturbance.  Respiratory: Negative for cough, chest tightness and shortness of breath.   Cardiovascular: Negative for chest pain, palpitations and leg swelling.  Gastrointestinal: Negative for abdominal pain, diarrhea, nausea and vomiting.  Genitourinary: Negative for difficulty urinating and dysuria.    Musculoskeletal: Positive for back pain. Negative for joint swelling.  Skin: Negative for color change and rash.  Neurological: Negative for dizziness, light-headedness and headaches.  Hematological: Negative for adenopathy. Does not bruise/bleed easily.  Psychiatric/Behavioral: Negative for agitation and dysphoric mood.       Objective:    Physical Exam  Constitutional: She is oriented to person, place, and time. She appears well-developed and well-nourished. No distress.  HENT:  Nose: Nose normal.  Mouth/Throat: Oropharynx is clear and moist.  Eyes: Right eye exhibits no discharge. Left eye exhibits no discharge. No scleral icterus.  Neck: Neck supple. No thyromegaly present.  Cardiovascular: Normal rate and regular rhythm.  Pulmonary/Chest: Breath sounds normal. No accessory muscle usage. No tachypnea. No respiratory distress. She has no decreased breath sounds. She has no wheezes. She has no rhonchi. Right breast exhibits no inverted nipple, no mass, no nipple discharge and no tenderness (no axillary adenopathy). Left breast exhibits no inverted nipple, no mass, no nipple discharge and no tenderness (no axilarry adenopathy).  Abdominal: Soft. Bowel sounds are normal. There is no tenderness.  Musculoskeletal: She exhibits no edema or tenderness.  Lymphadenopathy:    She has no cervical adenopathy.  Neurological: She is alert and oriented to person, place, and time.  Skin: Skin is warm. No rash noted. No erythema.  Psychiatric: She has a normal mood and affect. Her behavior is normal.    BP 120/84 (BP Location: Left Arm, Patient Position: Sitting, Cuff Size: Normal)   Pulse 90   Temp 98.6 F (37 C) (Oral)   Resp 14   Ht 5\' 5"  (1.651 m)   Wt 155 lb 3.2 oz (70.4 kg)   SpO2 98%   BMI 25.83 kg/m  Wt Readings from Last 3 Encounters:  09/01/17 155 lb 3.2 oz (70.4 kg)  08/28/17 156 lb (70.8 kg)  07/25/17 156 lb 9.6 oz (71 kg)     Lab Results  Component Value Date   WBC 4.7  08/09/2017   HGB 10.1 (L) 08/09/2017   HCT 31.0 (L) 08/09/2017   PLT 387.0 08/09/2017   GLUCOSE 98 08/28/2017   CHOL 205 (H) 02/22/2017   TRIG 94.0 02/22/2017   HDL 71.30 02/22/2017   LDLCALC 115 (H) 02/22/2017   ALT 10 02/22/2017   AST 16 08/28/2017   NA 138 08/28/2017   K 4.6 08/28/2017   CL 96 08/28/2017   CREATININE 1.02 (H) 08/28/2017   BUN 17 08/28/2017   CO2 29 07/05/2017   TSH 2.21 07/05/2017   INR 1.0 02/03/2015    Mm Digital Screening Bilateral  Result Date: 01/11/2017 CLINICAL DATA:  Screening. EXAM: DIGITAL SCREENING BILATERAL MAMMOGRAM WITH CAD COMPARISON:  Previous exam(s).  ACR Breast Density Category b: There are scattered areas of fibroglandular density. FINDINGS: There are no findings suspicious for malignancy. Images were processed with CAD. IMPRESSION: No mammographic evidence of malignancy. A result letter of this screening mammogram will be mailed directly to the patient. RECOMMENDATION: Screening mammogram in one year. (Code:SM-B-01Y) BI-RADS CATEGORY  1: Negative. Electronically Signed   By: Lillia Mountain M.D.   On: 01/11/2017 10:17       Assessment & Plan:   Problem List Items Addressed This Visit    Anemia    Found to be anemic on recent blood checks.  Wanted to refer to GI.  She has declined.  Will recheck cbc and ferritin.        Relevant Orders   CBC with Differential/Platelet   Ferritin   Back pain    History of previous kyphoplasty.  Increased pain.  Limits activity.  Was evaluated at [facility].  Felt surgery not warranted.  Now establishing with pain control.        Relevant Medications   HYDROcodone-acetaminophen (NORCO/VICODIN) 5-325 MG tablet   predniSONE (DELTASONE) 10 MG tablet   Essential hypertension, benign    Blood pressure under good control.  Continue same medication regimen.  Follow pressures.  Follow metabolic panel.        Relevant Orders   Basic metabolic panel   Health care maintenance    Physical today.   Mammogram 01/11/17 - Birads I.        Hypercholesterolemia    Follow lipid panel.        Relevant Orders   Hepatic function panel   Lipid panel   Trigeminal neuralgia    Followed by neurology.  On aptiom.  Stable.         Other Visit Diagnoses    Screening for breast cancer    -  Primary   Relevant Orders   MM DIGITAL SCREENING BILATERAL       Einar Pheasant, MD

## 2017-09-03 LAB — SEDIMENTATION RATE: Sed Rate: 5 mm/hr (ref 0–40)

## 2017-09-03 LAB — COMP. METABOLIC PANEL (12)
ALBUMIN: 4.5 g/dL (ref 3.5–4.7)
ALK PHOS: 83 IU/L (ref 39–117)
AST: 16 IU/L (ref 0–40)
Albumin/Globulin Ratio: 2.3 — ABNORMAL HIGH (ref 1.2–2.2)
BUN/Creatinine Ratio: 17 (ref 12–28)
BUN: 17 mg/dL (ref 8–27)
CREATININE: 1.02 mg/dL — AB (ref 0.57–1.00)
Calcium: 9.2 mg/dL (ref 8.7–10.3)
Chloride: 96 mmol/L (ref 96–106)
GFR calc Af Amer: 60 mL/min/{1.73_m2} (ref >59)
GFR calc non Af Amer: 52 mL/min/{1.73_m2} — ABNORMAL LOW (ref >59)
Globulin, Total: 2 g/dL (ref 1.5–4.5)
Glucose: 98 mg/dL (ref 65–99)
Potassium: 4.6 mmol/L (ref 3.5–5.2)
SODIUM: 138 mmol/L (ref 134–144)
Total Protein: 6.5 g/dL (ref 6.0–8.5)

## 2017-09-03 LAB — 25-HYDROXYVITAMIN D LCMS D2+D3: 25-HYDROXY, VITAMIN D: 47 ng/mL

## 2017-09-03 LAB — 25-HYDROXY VITAMIN D LCMS D2+D3: 25-Hydroxy, Vitamin D-3: 47 ng/mL

## 2017-09-03 LAB — MAGNESIUM: MAGNESIUM: 2.2 mg/dL (ref 1.6–2.3)

## 2017-09-03 LAB — C-REACTIVE PROTEIN: CRP: 1.1 mg/L (ref 0.0–4.9)

## 2017-09-04 ENCOUNTER — Encounter: Payer: Self-pay | Admitting: Internal Medicine

## 2017-09-04 DIAGNOSIS — M545 Low back pain: Secondary | ICD-10-CM | POA: Diagnosis not present

## 2017-09-04 DIAGNOSIS — G500 Trigeminal neuralgia: Secondary | ICD-10-CM | POA: Diagnosis not present

## 2017-09-04 DIAGNOSIS — R55 Syncope and collapse: Secondary | ICD-10-CM | POA: Diagnosis not present

## 2017-09-04 DIAGNOSIS — I10 Essential (primary) hypertension: Secondary | ICD-10-CM | POA: Diagnosis not present

## 2017-09-04 DIAGNOSIS — R262 Difficulty in walking, not elsewhere classified: Secondary | ICD-10-CM | POA: Diagnosis not present

## 2017-09-04 DIAGNOSIS — S51801D Unspecified open wound of right forearm, subsequent encounter: Secondary | ICD-10-CM | POA: Diagnosis not present

## 2017-09-04 NOTE — Assessment & Plan Note (Signed)
Follow lipid panel.   

## 2017-09-04 NOTE — Assessment & Plan Note (Signed)
Physical today.  Mammogram 01/11/17 - Birads I.

## 2017-09-04 NOTE — Assessment & Plan Note (Signed)
History of previous kyphoplasty.  Increased pain.  Limits activity.  Was evaluated at [facility].  Felt surgery not warranted.  Now establishing with pain control.

## 2017-09-04 NOTE — Assessment & Plan Note (Signed)
Found to be anemic on recent blood checks.  Wanted to refer to GI.  She has declined.  Will recheck cbc and ferritin.

## 2017-09-04 NOTE — Assessment & Plan Note (Signed)
Blood pressure under good control.  Continue same medication regimen.  Follow pressures.  Follow metabolic panel.   

## 2017-09-04 NOTE — Assessment & Plan Note (Signed)
Followed by neurology.  On aptiom.  Stable.

## 2017-09-05 DIAGNOSIS — I10 Essential (primary) hypertension: Secondary | ICD-10-CM | POA: Diagnosis not present

## 2017-09-05 DIAGNOSIS — M545 Low back pain: Secondary | ICD-10-CM | POA: Diagnosis not present

## 2017-09-05 DIAGNOSIS — D509 Iron deficiency anemia, unspecified: Secondary | ICD-10-CM | POA: Diagnosis not present

## 2017-09-05 DIAGNOSIS — G500 Trigeminal neuralgia: Secondary | ICD-10-CM | POA: Diagnosis not present

## 2017-09-08 ENCOUNTER — Telehealth: Payer: Self-pay | Admitting: Internal Medicine

## 2017-09-08 MED ORDER — TIZANIDINE HCL 4 MG PO TABS: 4.0000 mg | ORAL_TABLET | Freq: Three times a day (TID) | ORAL | 0 refills | Status: DC | PRN

## 2017-09-08 NOTE — Telephone Encounter (Signed)
Spoke with pt and informed her that this rx has been refilled. Pt gave a verbal understanding.

## 2017-09-08 NOTE — Telephone Encounter (Signed)
Script sent & patient notified .  

## 2017-09-08 NOTE — Telephone Encounter (Signed)
Copied from [place] [id number]. Topic: Quick Communication - See Telephone Encounter >> Sep 08, 2017  2:13 PM Hewitt Shorts wrote: CRM for notification. See Telephone encounter for: pt states that she requested thru [place] a couple of days ago a refill request for tizanidine and she states she will be out by Monday and they pharmacy hasnt heard anything-nothing is in the system and she is hoping for a call back about this before 5   Best number [phone]  09/08/17.

## 2017-09-08 NOTE — Telephone Encounter (Signed)
refill request for tizanidine

## 2017-09-12 DIAGNOSIS — M5134 Other intervertebral disc degeneration, thoracic region: Secondary | ICD-10-CM | POA: Insufficient documentation

## 2017-09-12 DIAGNOSIS — R937 Abnormal findings on diagnostic imaging of other parts of musculoskeletal system: Secondary | ICD-10-CM | POA: Insufficient documentation

## 2017-09-12 DIAGNOSIS — S32010S Wedge compression fracture of first lumbar vertebra, sequela: Secondary | ICD-10-CM | POA: Insufficient documentation

## 2017-09-12 DIAGNOSIS — M419 Scoliosis, unspecified: Secondary | ICD-10-CM | POA: Insufficient documentation

## 2017-09-12 NOTE — Progress Notes (Addendum)
Patient's Name: Denise Barry  MRN: [id number]  Referring Provider: Einar Pheasant, MD  DOB: [birth year]  PCP: Einar Pheasant, MD  DOS: 09/13/2017  Note by: Gaspar Cola, MD  Service setting: Ambulatory outpatient  Specialty: Interventional Pain Management  Location: [facility] (AMB) Pain Management Facility    Patient type: Established   Primary Reason(s) for Visit: Encounter for evaluation before starting new chronic pain management plan of care (Level of risk: moderate) CC: Back Pain (lower, left, right)  HPI  Denise Barry is a 83 y.o. year old, female patient, who comes today for a follow-up evaluation to review the test results and decide on a treatment plan. 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); Urinary incontinence; History of hip fracture (Left); Right leg swelling; Health care maintenance; Hyponatremia; History of shoulder fracture (Left); Syncope; Chronic low back pain (Primary Area of Pain)  (Bilateral) (L>R); Chronic pain syndrome; Opiate use; Other long term (current) drug therapy; Other specified health status; Other reduced mobility; Disorder of bone, unspecified; Acquired spondylolisthesis; Closed 2-part displaced fracture of surgical neck of left humerus; DDD (degenerative disc disease), lumbar; Pyuria; Spinal stenosis, unspecified region other than cervical; SUNCT (short unilateral neuralgiform headache, conjunctival inj/tear); Chronic anemia; Old Compression fracture of L1 and L2; DDD (degenerative disc disease), thoracic; Scoliosis; Abnormal MRI, lumbar spine (07/25/2017); Chronic musculoskeletal pain; Spasm of muscle of lower back (B); Lumbar Spondylosis; Lumbar facet syndrome (Bilateral) (L>R); Chronic sacroiliac joint pain (Bilateral) (L>R); Grade 1 Anterolisthesis of L4 over L5; and Grade 1 Retrolisthesis of L3 over L4 on their problem list. Her primarily concern today  is the Back Pain (lower, left, right)  Pain Assessment: Location: Right, Left, Lower Back Radiating: denies Onset: More than a month ago Duration: Chronic pain Quality: Spasm, Sharp Severity: 5 /10 (self-reported pain score)  Note: Reported level is compatible with observation.                         When using our objective Pain Scale, levels between 6 and 10/10 are said to belong in an emergency room, as it progressively worsens from a 6/10, described as severely limiting, requiring emergency care not usually available at an outpatient pain management facility. At a 6/10 level, communication becomes difficult and requires great effort. Assistance to reach the emergency department may be required. Facial flushing and profuse sweating along with potentially dangerous increases in heart rate and blood pressure will be evident. Timing: Intermittent Modifying factors: heating pad, Tylenol, muscle relaxer  Denise Barry comes in today for a follow-up visit after her initial evaluation on 08/28/2017. Today we went over the results of her tests. These were explained in "Layman's terms". During today's appointment we went over my diagnostic impression, as well as the proposed treatment plan.  According to patient primary area of pain is in her lower back. She admits the left side is greater than the right. She describes the pain as a pulsating, spasmodic-type pain. She admits the pain comes about every 4-5/day but she does have some flareups with performing ADLs. She is status post lumbar surgery approximately 4 years ago (November 2015) for back pain which was effective. (Dr. Sherral Hammers @ [facility]) She did undergo lumbar facet nerve block prior to her surgery which was not effective. She has completed some recent physical therapy along with home health therapy somewhat effective. She has had recent images. She admits that she did have  a fall that this year and was in the hospital but denies  any injury. She is currently taking muscle relaxants and Tylenol for the last month .  She denies any radiating pain in her lower extremities. She does admits that she is status post left hip surgery ([facility] for fracture from fall) (Spring of 2016) of after stepping off a curb the wrong way (misstep). Left shoulder surgery on 2017, due to fall and fracture. Fall due to light-headedness, probably due to anemia.  In considering the treatment plan options, Denise Barry was reminded that I no longer take patients for medication management only. I asked her to let me know if she had no intention of taking advantage of the interventional therapies, so that we could make arrangements to provide this space to someone interested. I also made it clear that undergoing interventional therapies for the purpose of getting pain medications is very inappropriate on the part of a patient, and it will not be tolerated in this practice. This type of behavior would suggest true addiction and therefore it requires referral to an addiction specialist.   Further details on both, my assessment(s), as well as the proposed treatment plan, please see below.  Controlled Substance Pharmacotherapy Assessment REMS (Risk Evaluation and Mitigation Strategy)  Analgesic: None at this time. Highest recorded MME/day: 20 mg/day MME/day: 0 mg/day Pill Count: None expected due to no prior prescriptions written by our practice. Landis Martins, RN  09/13/2017  5:28 PM  Signed Safety precautions to be maintained throughout the outpatient stay will include: orient to surroundings, keep bed in low position, maintain call bell within reach at all times, provide assistance with transfer out of bed and ambulation.    Pharmacokinetics: N/a Pharmacodynamics: N/A Monitoring: [place] PMP: Online review of the past 71-monthperiod previously conducted. Not applicable at this point since we have not taken over the patient's medication management  yet. List of other Serum/Urine Drug Screening Test(s):  Lab Results  Component Value Date   AMPHSCRSER Negative 08/28/2017   BARBSCRSER Negative 08/28/2017   BENZOSCRSER Negative 08/28/2017   COCAINSCRSER Negative 08/28/2017   PCPSCRSER Negative 08/28/2017   THCSCRSER Negative 08/28/2017   OPIATESCRSER Negative 08/28/2017   OXYSCRSER Negative 08/28/2017   PROPOXSCRSER Negative 08/28/2017   List of all UDS test(s) done:  No results found for: TOXASSSELUR, SUMMARY Last UDS on record: No results found for: TOXASSSELUR, SUMMARY UDS interpretation: No unexpected findings.          Medication Assessment Form: Patient introduced to form today Treatment compliance: Treatment may start today if patient agrees with proposed plan. Evaluation of compliance is not applicable at this point Risk Assessment Profile: Aberrant behavior: See initial evaluations. None observed or detected today Comorbid factors increasing risk of overdose: See initial evaluation. No additional risks detected today Medical Psychology Evaluation: Please see scanned results in medical record. Opioid Risk Tool - 08/28/17 1305      Family History of Substance Abuse   Alcohol  Positive Female    Illegal Drugs  Negative    Rx Drugs  Negative      Personal History of Substance Abuse   Alcohol  Negative    Illegal Drugs  Negative    Rx Drugs  Negative      Age   Age between 127-45years   No      History of Preadolescent Sexual Abuse   History of Preadolescent Sexual Abuse  Negative or Female      Psychological Disease  Psychological Disease  Negative    Depression  Negative      Total Score   Opioid Risk Tool Scoring  3    Opioid Risk Interpretation  Low Risk      ORT Scoring interpretation table:  Score <3 = Low Risk for SUD  Score between 4-7 = Moderate Risk for SUD  Score >8 = High Risk for Opioid Abuse   Risk Mitigation Strategies:  Patient opioid safety counseling:  Not applicable. Patient-Prescriber  Agreement (PPA): No agreement signed.  Controlled substance notification to other providers: Not applicable  Pharmacologic Plan: The patient does have confirmed pathology for which the use of opioid therapy is justified.             Laboratory Chemistry  Inflammation Markers (CRP: Acute Phase) (ESR: Chronic Phase) Lab Results  Component Value Date   CRP 1.1 08/28/2017   ESRSEDRATE 5 08/28/2017                 Renal Function Markers Lab Results  Component Value Date   BUN 17 08/28/2017   CREATININE 1.02 (H) 08/28/2017   GFRAA 60 08/28/2017   GFRNONAA 52 (L) 08/28/2017                 Hepatic Function Markers Lab Results  Component Value Date   AST 16 08/28/2017   ALT 10 02/22/2017   ALBUMIN 4.5 08/28/2017   ALKPHOS 83 08/28/2017                 Electrolytes Lab Results  Component Value Date   NA 138 08/28/2017   K 4.6 08/28/2017   CL 96 08/28/2017   CALCIUM 9.2 08/28/2017   MG 2.2 08/28/2017                 Neuropathy Markers No results found for: [id number]               Bone Pathology Markers Lab Results  Component Value Date   ALKPHOS 83 08/28/2017   25OHVITD1 47 08/28/2017   25OHVITD2 <1.0 08/28/2017   25OHVITD3 47 08/28/2017   CALCIUM 9.2 08/28/2017                 Rheumatology Markers No results found for: LABURIC              Coagulation Parameters Lab Results  Component Value Date   INR 1.0 02/03/2015   LABPROT 13.3 02/03/2015   APTT 26.6 01/12/2014   PLT 387.0 08/09/2017                 Cardiovascular Markers Lab Results  Component Value Date   TROPONINI <0.03 02/03/2015   HGB 10.1 (L) 08/09/2017   HCT 31.0 (L) 08/09/2017                 CA Markers No results found for: CEA, CA125, LABCA2               Note: Lab results reviewed.  Recent Diagnostic Imaging Review  Shoulder Imaging: Shoulder-L DG:  Results for orders placed during the hospital encounter of 02/03/15  DG Shoulder Left   Narrative * PRIOR REPORT IMPORTED FROM AN  EXTERNAL SYSTEM *   CLINICAL DATA:  83 year old female who fell from standing at 1600  hours. Left upper extremity pain. Initial encounter.   EXAM:  DG SHOULDER 3+VIEWS LEFT   COMPARISON:  Portable chest radiograph 01/12/2014.   FINDINGS:  Comminuted displaced and impacted proximal left humerus  fracture.  Comminuted fracture through the neck of the humerus with a 2 cm  butterfly fragment. There is 1 full shaft with anterior  displacement, and over riding of 3-4 cm. The humeral head appears to  remain normally aligned in the glenoid but may be abnormally  rotated, uncertain.   Left clavicle and scapula appear to remain intact. Visible left ribs  and lung parenchyma are stable.   IMPRESSION:  Comminuted proximal left humerus fracture with 2 cm butterfly  fragment, 1 full shaft width of anterior displacement, and  over-riding of 3-4 cm.    Electronically Signed    By: Genevie Ann M.D.    On: 02/03/2015 20:47       Lumbosacral Imaging: Lumbar DG 2-3 views:  Results for orders placed in visit on 03/23/17  DG Lumbar Spine 2-3 Views   Narrative CLINICAL DATA:  Low back pain  EXAM: LUMBAR SPINE - 2-3 VIEW  COMPARISON:  01/12/2014  FINDINGS: Postsurgical changes are again seen and stable. Mild scoliosis concave to the right is seen. Vertebral body height is well maintained with the exception of mild superior endplate compression at L2. This appears chronic in nature. Multilevel osteophytic changes are seen. Stable anterolisthesis at L4-5 is noted. No soft tissue abnormality is seen. Left hip replacement is noted.  IMPRESSION: Postsurgical and degenerative changes.  No acute abnormality noted.   Electronically Signed   By: Inez Catalina M.D.   On: 03/23/2017 15:06    Complexity Note: Imaging results reviewed. Results shared with Ms. Baley, using [employer].                         Meds   Current Outpatient Medications:  .  Acetaminophen (TYLENOL EXTRA  STRENGTH PO), Take 1-2 tablets by mouth daily., Disp: , Rfl:  .  amLODipine (NORVASC) 10 MG tablet, Take 1 tablet (10 mg total) by mouth daily., Disp: 30 tablet, Rfl: 3 .  aspirin 81 MG tablet, Take 81 mg by mouth daily., Disp: , Rfl:  .  baclofen (LIORESAL) 10 MG tablet, Take 1 tablet (10 mg total) 3 (three) times daily by mouth., Disp: 90 tablet, Rfl: 0 .  bisacodyl (DULCOLAX) 5 MG EC tablet, Take 5 mg by mouth as needed. , Disp: , Rfl:  .  Eslicarbazepine Acetate (APTIOM PO), Take 1 tablet by mouth 2 (two) times daily., Disp: , Rfl:  .  estradiol (ESTRACE VAGINAL) 0.1 MG/GM vaginal cream, Apply PRN, Disp: , Rfl:  .  fexofenadine (ALLEGRA) 180 MG tablet, Take 180 mg by mouth as needed for allergies or rhinitis., Disp: , Rfl:  .  Magnesium Oxide 500 MG CAPS, Take 1 capsule (500 mg total) at bedtime by mouth., Disp: 30 capsule, Rfl: 0 .  meloxicam (MOBIC) 15 MG tablet, Take 1 tablet (15 mg total) daily by mouth., Disp: 30 tablet, Rfl: 0 .  Multiple Vitamin (MULTI-VITAMINS) TABS, Take 1 tablet by mouth. , Disp: , Rfl:  .  tiZANidine (ZANAFLEX) 4 MG tablet, Take 1 tablet (4 mg total) 3 (three) times daily as needed by mouth for muscle spasms., Disp: 90 tablet, Rfl: 0  ROS  Constitutional: Denies any fever or chills Gastrointestinal: No reported hemesis, hematochezia, vomiting, or acute GI distress Musculoskeletal: Denies any acute onset joint swelling, redness, loss of ROM, or weakness Neurological: No reported episodes of acute onset apraxia, aphasia, dysarthria, agnosia, amnesia, paralysis, loss of coordination, or loss of consciousness  Allergies  Ms. Biscardi is allergic  to codeine; cyclobenzaprine; methocarbamol; carbamazepine; and gabapentin.  [place]  Drug: Ms. Outland  reports that she does not use drugs. Alcohol:  reports that she does not drink alcohol. Tobacco:  reports that she has quit smoking. she has never used smokeless tobacco. Medical:  has a past medical history of Allergy,  Hypercholesterolemia, Hypertension, Osteoarthritis, Urinary incontinence, and Varicose veins. Surgical: Ms. Macaluso  has a past surgical history that includes Back surgery (09/16/13); sclerosis (2000); Dilation and curettage of uterus (1971); Tubal ligation (1972); and Joint replacement. Family: family history includes Diabetes in her mother; Heart disease in her father and mother; Hypertension in her mother; Stroke in her father.  Constitutional Exam  General appearance: Well nourished, well developed, and well hydrated. In no apparent acute distress Vitals:   09/13/17 1147  BP: (!) 154/60  Pulse: 78  Resp: 16  Temp: 98.4 F (36.9 C)  TempSrc: Oral  SpO2: 97%  Weight: 155 lb (70.3 kg)  Height: '5\' 5"'$  (1.651 m)   BMI Assessment: Estimated body mass index is 25.79 kg/m as calculated from the following:   Height as of this encounter: '5\' 5"'$  (1.651 m).   Weight as of this encounter: 155 lb (70.3 kg).  BMI interpretation table: BMI level Category Range association with higher incidence of chronic pain  <18 kg/m2 Underweight   18.5-24.9 kg/m2 Ideal body weight   25-29.9 kg/m2 Overweight Increased incidence by 20%  30-34.9 kg/m2 Obese (Class I) Increased incidence by 68%  35-39.9 kg/m2 Severe obesity (Class II) Increased incidence by 136%  >40 kg/m2 Extreme obesity (Class III) Increased incidence by 254%   BMI Readings from Last 4 Encounters:  09/13/17 25.79 kg/m  09/01/17 25.83 kg/m  08/28/17 25.96 kg/m  07/25/17 23.81 kg/m   Wt Readings from Last 4 Encounters:  09/13/17 155 lb (70.3 kg)  09/01/17 155 lb 3.2 oz (70.4 kg)  08/28/17 156 lb (70.8 kg)  07/25/17 156 lb 9.6 oz (71 kg)  Psych/Mental status: Alert, oriented x 3 (person, place, & time)       Eyes: PERLA Respiratory: No evidence of acute respiratory distress  Cervical Spine Area Exam  Skin & Axial Inspection: No masses, redness, edema, swelling, or associated skin lesions Alignment: Symmetrical Functional ROM:  Unrestricted ROM      Stability: No instability detected Muscle Tone/Strength: Functionally intact. No obvious neuro-muscular anomalies detected. Sensory (Neurological): Unimpaired Palpation: No palpable anomalies              Upper Extremity (UE) Exam    Side: Right upper extremity  Side: Left upper extremity  Skin & Extremity Inspection: Skin color, temperature, and hair growth are WNL. No peripheral edema or cyanosis. No masses, redness, swelling, asymmetry, or associated skin lesions. No contractures.  Skin & Extremity Inspection: Skin color, temperature, and hair growth are WNL. No peripheral edema or cyanosis. No masses, redness, swelling, asymmetry, or associated skin lesions. No contractures.  Functional ROM: Unrestricted ROM          Functional ROM: Unrestricted ROM          Muscle Tone/Strength: Functionally intact. No obvious neuro-muscular anomalies detected.  Muscle Tone/Strength: Functionally intact. No obvious neuro-muscular anomalies detected.  Sensory (Neurological): Unimpaired          Sensory (Neurological): Unimpaired          Palpation: No palpable anomalies              Palpation: No palpable anomalies  Specialized Test(s): Deferred         Specialized Test(s): Deferred          Thoracic Spine Area Exam  Skin & Axial Inspection: No masses, redness, or swelling Alignment: Symmetrical Functional ROM: Unrestricted ROM Stability: No instability detected Muscle Tone/Strength: Functionally intact. No obvious neuro-muscular anomalies detected. Sensory (Neurological): Unimpaired Muscle strength & Tone: No palpable anomalies  Lumbar Spine Area Exam  Skin & Axial Inspection: No masses, redness, or swelling Alignment: Symmetrical Functional ROM: Unrestricted ROM      Stability: No instability detected Muscle Tone/Strength: Functionally intact. No obvious neuro-muscular anomalies detected. Sensory (Neurological): Unimpaired Palpation: No palpable anomalies        Provocative Tests: Lumbar Hyperextension and rotation test: evaluation deferred today       Lumbar Lateral bending test: evaluation deferred today       Patrick's Maneuver: evaluation deferred today                    Gait & Posture Assessment  Ambulation: Unassisted Gait: Relatively normal for age and body habitus Posture: WNL   Lower Extremity Exam    Side: Right lower extremity  Side: Left lower extremity  Skin & Extremity Inspection: Skin color, temperature, and hair growth are WNL. No peripheral edema or cyanosis. No masses, redness, swelling, asymmetry, or associated skin lesions. No contractures.  Skin & Extremity Inspection: Skin color, temperature, and hair growth are WNL. No peripheral edema or cyanosis. No masses, redness, swelling, asymmetry, or associated skin lesions. No contractures.  Functional ROM: Unrestricted ROM          Functional ROM: Unrestricted ROM          Muscle Tone/Strength: Functionally intact. No obvious neuro-muscular anomalies detected.  Muscle Tone/Strength: Functionally intact. No obvious neuro-muscular anomalies detected.  Sensory (Neurological): Unimpaired  Sensory (Neurological): Unimpaired  Palpation: No palpable anomalies  Palpation: No palpable anomalies   Assessment & Plan  Primary Diagnosis & Pertinent Problem List: The primary encounter diagnosis was Chronic pain syndrome. Diagnoses of Chronic low back pain (Primary Area of Pain)  (Bilateral) (L>R), Grade 1 Retrolisthesis of L3 over L4, Grade 1 Anterolisthesis of L4 over L5, Facet syndrome, lumbar (B), Lumbar Spondylosis, Chronic sacroiliac joint pain (B), Abnormal MRI, lumbar spine (07/25/2017), Failed back surgical syndrome, Compression fracture of L1 lumbar vertebra, sequela, Spasm of muscle of lower back (B), DDD (degenerative disc disease), thoracic, Trigeminal neuralgia, and Chronic musculoskeletal pain were also pertinent to this visit.  Visit Diagnosis: 1. Chronic pain syndrome   2.  Chronic low back pain (Primary Area of Pain)  (Bilateral) (L>R)   3. Grade 1 Retrolisthesis of L3 over L4   4. Grade 1 Anterolisthesis of L4 over L5   5. Facet syndrome, lumbar (B)   6. Lumbar Spondylosis   7. Chronic sacroiliac joint pain (B)   8. Abnormal MRI, lumbar spine (07/25/2017)   9. Failed back surgical syndrome   10. Compression fracture of L1 lumbar vertebra, sequela   11. Spasm of muscle of lower back (B)   12. DDD (degenerative disc disease), thoracic   13. Trigeminal neuralgia   14. Chronic musculoskeletal pain    Problems updated and reviewed during this visit: Problem  Chronic Musculoskeletal Pain  Spasm of muscle of lower back (B)  Lumbar Spondylosis  Lumbar facet syndrome (Bilateral) (L>R)  Chronic sacroiliac joint pain (Bilateral) (L>R)  Grade 1 Anterolisthesis of L4 over L5  Grade 1 Retrolisthesis of L3 over L4  Old Compression  fracture of L1 and L2  Abnormal MRI, lumbar spine (07/25/2017)   FINDINGS: Vertebral Column: Stable wedging and depression of the superior L1 vertebral body  Grade 1 retrolisthesis of 6 mm at L2-L3 and 4 mm at L3-L4 L4-L5 grade 1 anterolisthesis of between 4 and 5 mm grade 1 anterolisthesis at L5-S1 Degenerative disc disease most severe at L2-L3. T11-T12: Disc desiccation; disc bulge T12-L1: Schmorl's node; disc desiccation; disc bulge L1-L2: Disc desiccation; disc bulge; disc/osteophyte complexes; facet arthropathy and ligamentum flavum hypertrophy. L2-L3: Grade 1 retrolisthesis with reactive marrow changes; severe degenerative disc disease; degenerative facet and ligamentum flavum hypertrophy; Posterolateral impression on the thecal sac; central canal stenosis; right foraminal stenosis;  Left neural foramen is narrowed. L3-L4: Disc desiccation; disc bulge and disc/osteophyte complexes; ventral impression on the thecal sac; Hardware artifacts at L4; degenerative facet arthropathy and ligamentum flavum hypertrophy; moderate central  canal stenosis; bilateral foraminal stenosis. L4-L5: Grade 1 anterolisthesis; disc space narrowing, desiccation; Disc/osteophyte complexes and mild disc bulge; Status post posterolateral fusion. More prominent medial ridge extends from the fused right facet joint, with more prominent impression on the posterolateral right thecal sac. L5-S1: Disc space narrowing; grade 1 anterolisthesis; disc desiccation; disc bulge; disc/osteophyte complexes with ventral impression on the thecal sac; facet and ligament flavum hypertrophy, more on the left; impressing on the S1 nerve roots in the lateral recesses, more on the left; neural foramina appears to have narrowing and stenosis bilaterally; 6 mm circumscribed high T2 and low T1 signal focus in the left posterior subcortical S1 vertebra, which may represent a small intraosseous ganglion or degenerative cyst.   History of shoulder fracture (Left)  History of hip fracture (Left)  Chronic Trigeminal neuralgia (Right)  Failed back surgical syndrome (L4-5 fusion)   Time Note: Greater than 50% of the 40 minute(s) of face-to-face time spent with Ms. Slavick, was spent in counseling/coordination of care regarding: Ms. Vannostrand primary cause of pain, the results of her recent test(s), the significance of each one oth the test(s) anomalies and it's corresponding characteristic pain pattern(s), the treatment plan, treatment alternatives, the risks and possible complications of proposed treatment, medication side effects, the goals of pain management (increased in functionality) and the need to collect and read the AVS material.  Plan of Care  Pharmacotherapy (Medications Ordered): Meds ordered this encounter  Medications  . baclofen (LIORESAL) 10 MG tablet    Sig: Take 1 tablet (10 mg total) 3 (three) times daily by mouth.    Dispense:  90 tablet    Refill:  0    Do not place this medication, or any other prescription from our practice, on "Automatic Refill".  Patient may have prescription filled one day early if pharmacy is closed on scheduled refill date.  . meloxicam (MOBIC) 15 MG tablet    Sig: Take 1 tablet (15 mg total) daily by mouth.    Dispense:  30 tablet    Refill:  0    Do not add this medication to the electronic "Automatic Refill" notification system. Patient may have prescription filled one day early if pharmacy is closed on scheduled refill date.  . Magnesium Oxide 500 MG CAPS    Sig: Take 1 capsule (500 mg total) at bedtime by mouth.    Dispense:  30 capsule    Refill:  0    Do not place medication on "Automatic Refill". Fill one day early if pharmacy is closed on scheduled refill date.    Procedure Orders     SACROILIAC  JOINT INJECTION     LUMBAR FACET(MEDIAL BRANCH NERVE BLOCK) [facility] Lab Orders  No laboratory test(s) ordered today    Imaging Orders     DG Lumbar Spine Complete W/Bend Referral Orders  No referral(s) requested today    Pharmacological management options:  Opioid Analgesics: None prescribed at this time. Membrane stabilizer: None prescribed at this time Muscle relaxant: The patient indicates having some weird side effects to the use of Zanaflex and therefore I will hold her current use of the medication and do a trial of baclofen 10 mg by mouth 3 times a day. NSAID: Mobic 15 mg by mouth daily in a.m. trial. Other analgesic(s): I will do a trial of magnesium 500 mg by mouth at bedtime to see if this can help her with some of her muscle discomfort.   Interventional management options: Planned, scheduled, and/or pending:    Diagnostic bilateral lumbar facet block + diagnostic bilateral sacroiliac joint block under fluoroscopic guidance and IV sedation    Considering:   Diagnostic bilateral lumbar facet block  Possible bilateral lumbar facet RFA  Diagnostic bilateral sacroiliac joint block  Possible bilateral sacroiliac joint RFA  Diagnostic caudal epidural steroid injection plus diagnostic epidurogram   Possible Racz procedure  Diagnostic left sided L2-3 interlaminar lumbar epidural steroid injection    PRN Procedures:   None at this time   Provider-requested follow-up: Return for Procedure (w/ sedation): (B) L-FCT BLK + SI BLK.  Future Appointments  Date Time Provider [place]  09/28/2017  9:45 AM Milinda Pointer, MD [facility] None  10/16/2017  1:00 PM O'Brien-Blaney, Bryson Corona, LPN [facility]  [id number]  9:40 AM [facility] MM DEXA [facility]    Primary Care Physician: Einar Pheasant, MD Location: [facility] Out[facility] Facility Note by: Gaspar Cola, MD Date: 09/13/2017; Time: 5:36 PM

## 2017-09-13 ENCOUNTER — Other Ambulatory Visit: Payer: Self-pay

## 2017-09-13 ENCOUNTER — Encounter: Payer: Self-pay | Admitting: Pain Medicine

## 2017-09-13 ENCOUNTER — Ambulatory Visit: Payer: Medicare Other | Attending: Pain Medicine | Admitting: Pain Medicine

## 2017-09-13 VITALS — BP 154/60 | HR 78 | Temp 98.4°F | Resp 16 | Ht 65.0 in | Wt 155.0 lb

## 2017-09-13 DIAGNOSIS — G8929 Other chronic pain: Secondary | ICD-10-CM

## 2017-09-13 DIAGNOSIS — G500 Trigeminal neuralgia: Secondary | ICD-10-CM

## 2017-09-13 DIAGNOSIS — S32010S Wedge compression fracture of first lumbar vertebra, sequela: Secondary | ICD-10-CM | POA: Diagnosis not present

## 2017-09-13 DIAGNOSIS — M899 Disorder of bone, unspecified: Secondary | ICD-10-CM | POA: Diagnosis not present

## 2017-09-13 DIAGNOSIS — M5136 Other intervertebral disc degeneration, lumbar region: Secondary | ICD-10-CM | POA: Diagnosis not present

## 2017-09-13 DIAGNOSIS — M5134 Other intervertebral disc degeneration, thoracic region: Secondary | ICD-10-CM

## 2017-09-13 DIAGNOSIS — G894 Chronic pain syndrome: Secondary | ICD-10-CM | POA: Insufficient documentation

## 2017-09-13 DIAGNOSIS — M7989 Other specified soft tissue disorders: Secondary | ICD-10-CM | POA: Insufficient documentation

## 2017-09-13 DIAGNOSIS — M7918 Myalgia, other site: Secondary | ICD-10-CM | POA: Insufficient documentation

## 2017-09-13 DIAGNOSIS — R937 Abnormal findings on diagnostic imaging of other parts of musculoskeletal system: Secondary | ICD-10-CM | POA: Diagnosis not present

## 2017-09-13 DIAGNOSIS — M48061 Spinal stenosis, lumbar region without neurogenic claudication: Secondary | ICD-10-CM | POA: Diagnosis not present

## 2017-09-13 DIAGNOSIS — E871 Hypo-osmolality and hyponatremia: Secondary | ICD-10-CM | POA: Insufficient documentation

## 2017-09-13 DIAGNOSIS — M47816 Spondylosis without myelopathy or radiculopathy, lumbar region: Secondary | ICD-10-CM | POA: Insufficient documentation

## 2017-09-13 DIAGNOSIS — M419 Scoliosis, unspecified: Secondary | ICD-10-CM | POA: Diagnosis not present

## 2017-09-13 DIAGNOSIS — Z79899 Other long term (current) drug therapy: Secondary | ICD-10-CM | POA: Diagnosis not present

## 2017-09-13 DIAGNOSIS — D649 Anemia, unspecified: Secondary | ICD-10-CM | POA: Insufficient documentation

## 2017-09-13 DIAGNOSIS — M533 Sacrococcygeal disorders, not elsewhere classified: Secondary | ICD-10-CM | POA: Insufficient documentation

## 2017-09-13 DIAGNOSIS — M4310 Spondylolisthesis, site unspecified: Secondary | ICD-10-CM | POA: Insufficient documentation

## 2017-09-13 DIAGNOSIS — M62830 Muscle spasm of back: Secondary | ICD-10-CM | POA: Insufficient documentation

## 2017-09-13 DIAGNOSIS — M4856XA Collapsed vertebra, not elsewhere classified, lumbar region, initial encounter for fracture: Secondary | ICD-10-CM | POA: Insufficient documentation

## 2017-09-13 DIAGNOSIS — M961 Postlaminectomy syndrome, not elsewhere classified: Secondary | ICD-10-CM

## 2017-09-13 DIAGNOSIS — R55 Syncope and collapse: Secondary | ICD-10-CM | POA: Insufficient documentation

## 2017-09-13 DIAGNOSIS — Z885 Allergy status to narcotic agent status: Secondary | ICD-10-CM | POA: Diagnosis not present

## 2017-09-13 DIAGNOSIS — R51 Headache: Secondary | ICD-10-CM | POA: Diagnosis not present

## 2017-09-13 DIAGNOSIS — E7800 Pure hypercholesterolemia, unspecified: Secondary | ICD-10-CM | POA: Insufficient documentation

## 2017-09-13 DIAGNOSIS — R32 Unspecified urinary incontinence: Secondary | ICD-10-CM | POA: Diagnosis not present

## 2017-09-13 DIAGNOSIS — M4316 Spondylolisthesis, lumbar region: Secondary | ICD-10-CM | POA: Insufficient documentation

## 2017-09-13 DIAGNOSIS — M545 Low back pain: Secondary | ICD-10-CM | POA: Diagnosis not present

## 2017-09-13 DIAGNOSIS — I10 Essential (primary) hypertension: Secondary | ICD-10-CM | POA: Insufficient documentation

## 2017-09-13 DIAGNOSIS — Z7982 Long term (current) use of aspirin: Secondary | ICD-10-CM | POA: Insufficient documentation

## 2017-09-13 MED ORDER — MELOXICAM 15 MG PO TABS: 15.0000 mg | ORAL_TABLET | Freq: Every day | ORAL | 0 refills | Status: DC

## 2017-09-13 MED ORDER — MAGNESIUM OXIDE -MG SUPPLEMENT 500 MG PO CAPS: 1.0000 | ORAL_CAPSULE | Freq: Every day | ORAL | 0 refills | Status: DC

## 2017-09-13 MED ORDER — BACLOFEN 10 MG PO TABS: 10.0000 mg | ORAL_TABLET | Freq: Three times a day (TID) | ORAL | 0 refills | Status: DC

## 2017-09-13 NOTE — Patient Instructions (Addendum)
____________________________________________________________________________________________  Preparing for Procedure with Sedation Instructions: . Oral Intake: Do not eat or drink anything for at least 8 hours prior to your procedure. . Transportation: Public transportation is not allowed. Bring an adult driver. The driver must be physically present in our waiting room before any procedure can be started. Marland Kitchen Physical Assistance: Bring an adult physically capable of assisting you, in the event you need help. This adult should keep you company at home for at least 6 hours after the procedure. . Blood Pressure Medicine: Take your blood pressure medicine with a sip of water the morning of the procedure. . Blood thinners:  . Diabetics on insulin: Notify the staff so that you can be scheduled 1st case in the morning. If your diabetes requires high dose insulin, take only  of your normal insulin dose the morning of the procedure and notify the staff that you have done so. . Preventing infections: Shower with an antibacterial soap the morning of your procedure. . Build-up your immune system: Take 1000 mg of Vitamin C with every meal (3 times a day) the day prior to your procedure. Marland Kitchen Antibiotics: Inform the staff if you have a condition or reason that requires you to take antibiotics before dental procedures. . Pregnancy: If you are pregnant, call and cancel the procedure. . Sickness: If you have a cold, fever, or any active infections, call and cancel the procedure. . Arrival: You must be in the facility at least 30 minutes prior to your scheduled procedure. . Children: Do not bring children with you. . Dress appropriately: Bring dark clothing that you would not mind if they get stained. . Valuables: Do not bring any jewelry or valuables. Procedure appointments are reserved for interventional treatments only. Marland Kitchen No Prescription Refills. . No medication changes will be discussed during procedure  appointments. . No disability issues will be discussed. ____________________________________________________________________________________________   Hold Zanaflex. Pain Management Discharge Instructions  General Discharge Instructions :  If you need to reach your doctor call: Monday-Friday 8:00 am - 4:00 pm at [phone] or toll free [phone].  After clinic hours [phone] to have operator reach doctor.  Bring all of your medication bottles to all your appointments in the pain clinic.  To cancel or reschedule your appointment with Pain Management please remember to call 24 hours in advance to avoid a fee.  Refer to the educational materials which you have been given on: General Risks, I had my Procedure. Discharge Instructions, Post Sedation.  Post Procedure Instructions:  The drugs you were given will stay in your system until tomorrow, so for the next 24 hours you should not drive, make any legal decisions or drink any alcoholic beverages.  You may eat anything you prefer, but it is better to start with liquids then soups and crackers, and gradually work up to solid foods.  Please notify your doctor immediately if you have any unusual bleeding, trouble breathing or pain that is not related to your normal pain.  Depending on the type of procedure that was done, some parts of your body may feel week and/or numb.  This usually clears up by tonight or the next day.  Walk with the use of an assistive device or accompanied by an adult for the 24 hours.  You may use ice on the affected area for the first 24 hours.  Put ice in a Ziploc bag and cover with a towel and place against area 15 minutes on 15 minutes off.  You may switch  to heat after 24 hours.GENERAL RISKS AND COMPLICATIONS  What are the risk, side effects and possible complications? Generally speaking, most procedures are safe.  However, with any procedure there are risks, side effects, and the possibility of  complications.  The risks and complications are dependent upon the sites that are lesioned, or the type of nerve block to be performed.  The closer the procedure is to the spine, the more serious the risks are.  Great care is taken when placing the radio frequency needles, block needles or lesioning probes, but sometimes complications can occur. 1. Infection: Any time there is an injection through the skin, there is a risk of infection.  This is why sterile conditions are used for these blocks.  There are four possible types of infection. 1. Localized skin infection. 2. Central Nervous System Infection-This can be in the form of Meningitis, which can be deadly. 3. Epidural Infections-This can be in the form of an epidural abscess, which can cause pressure inside of the spine, causing compression of the spinal cord with subsequent paralysis. This would require an emergency surgery to decompress, and there are no guarantees that the patient would recover from the paralysis. 4. Discitis-This is an infection of the intervertebral discs.  It occurs in about 1% of discography procedures.  It is difficult to treat and it may lead to surgery.        2. Pain: the needles have to go through skin and soft tissues, will cause soreness.       3. Damage to internal structures:  The nerves to be lesioned may be near blood vessels or    other nerves which can be potentially damaged.       4. Bleeding: Bleeding is more common if the patient is taking blood thinners such as  aspirin, Coumadin, Ticiid, Plavix, etc., or if he/she have some genetic predisposition  such as hemophilia. Bleeding into the spinal canal can cause compression of the spinal  cord with subsequent paralysis.  This would require an emergency surgery to  decompress and there are no guarantees that the patient would recover from the  paralysis.       5. Pneumothorax:  Puncturing of a lung is a possibility, every time a needle is introduced in  the area of  the chest or upper back.  Pneumothorax refers to free air around the  collapsed lung(s), inside of the thoracic cavity (chest cavity).  Another two possible  complications related to a similar event would include: Hemothorax and Chylothorax.   These are variations of the Pneumothorax, where instead of air around the collapsed  lung(s), you may have blood or chyle, respectively.       6. Spinal headaches: They may occur with any procedures in the area of the spine.       7. Persistent CSF (Cerebro-Spinal Fluid) leakage: This is a rare problem, but may occur  with prolonged intrathecal or epidural catheters either due to the formation of a fistulous  track or a dural tear.       8. Nerve damage: By working so close to the spinal cord, there is always a possibility of  nerve damage, which could be as serious as a permanent spinal cord injury with  paralysis.       9. Death:  Although rare, severe deadly allergic reactions known as "Anaphylactic  reaction" can occur to any of the medications used.      10. Worsening of the symptoms:  We  can always make thing worse.  What are the chances of something like this happening? Chances of any of this occuring are extremely low.  By statistics, you have more of a chance of getting killed in a motor vehicle accident: while driving to the hospital than any of the above occurring .  Nevertheless, you should be aware that they are possibilities.  In general, it is similar to taking a shower.  Everybody knows that you can slip, hit your head and get killed.  Does that mean that you should not shower again?  Nevertheless always keep in mind that statistics do not mean anything if you happen to be on the wrong side of them.  Even if a procedure has a 1 (one) in a 1,000,000 (million) chance of going wrong, it you happen to be that one..Also, keep in mind that by statistics, you have more of a chance of having something go wrong when taking medications.  Who should not have  this procedure? If you are on a blood thinning medication (e.g. Coumadin, Plavix, see list of "Blood Thinners"), or if you have an active infection going on, you should not have the procedure.  If you are taking any blood thinners, please inform your physician.  How should I prepare for this procedure?  Do not eat or drink anything at least six hours prior to the procedure.  Bring a driver with you .  It cannot be a taxi.  Come accompanied by an adult that can drive you back, and that is strong enough to help you if your legs get weak or numb from the local anesthetic.  Take all of your medicines the morning of the procedure with just enough water to swallow them.  If you have diabetes, make sure that you are scheduled to have your procedure done first thing in the morning, whenever possible.  If you have diabetes, take only half of your insulin dose and notify our nurse that you have done so as soon as you arrive at the clinic.  If you are diabetic, but only take blood sugar pills (oral hypoglycemic), then do not take them on the morning of your procedure.  You may take them after you have had the procedure.  Do not take aspirin or any aspirin-containing medications, at least eleven (11) days prior to the procedure.  They may prolong bleeding.  Wear loose fitting clothing that may be easy to take off and that you would not mind if it got stained with Betadine or blood.  Do not wear any jewelry or perfume  Remove any nail coloring.  It will interfere with some of our monitoring equipment.  NOTE: Remember that this is not meant to be interpreted as a complete list of all possible complications.  Unforeseen problems may occur.  BLOOD THINNERS The following drugs contain aspirin or other products, which can cause increased bleeding during surgery and should not be taken for 2 weeks prior to and 1 week after surgery.  If you should need take something for relief of minor pain, you may take  acetaminophen which is found in Tylenol,m Datril, Anacin-3 and Panadol. It is not blood thinner. The products listed below are.  Do not take any of the products listed below in addition to any listed on your instruction sheet.  A.P.C or A.P.C with Codeine Codeine Phosphate Capsules #3 Ibuprofen Ridaura  ABC compound Congesprin Imuran rimadil  Advil Cope Indocin Robaxisal  Alka-Seltzer Effervescent Pain Reliever and Antacid Coricidin  or Coricidin-D  Indomethacin Rufen  Alka-Seltzer plus Cold Medicine Cosprin Ketoprofen S-A-C Tablets  Anacin Analgesic Tablets or Capsules Coumadin Korlgesic Salflex  Anacin Extra Strength Analgesic tablets or capsules CP-2 Tablets Lanoril Salicylate  Anaprox Cuprimine Capsules Levenox Salocol  Anexsia-D Dalteparin Magan Salsalate  Anodynos Darvon compound Magnesium Salicylate Sine-off  Ansaid Dasin Capsules Magsal Sodium Salicylate  Anturane Depen Capsules Marnal Soma  APF Arthritis pain formula Dewitt's Pills Measurin Stanback  Argesic Dia-Gesic Meclofenamic Sulfinpyrazone  Arthritis Bayer Timed Release Aspirin Diclofenac Meclomen Sulindac  Arthritis pain formula Anacin Dicumarol Medipren Supac  Analgesic (Safety coated) Arthralgen Diffunasal Mefanamic Suprofen  Arthritis Strength Bufferin Dihydrocodeine Mepro Compound Suprol  Arthropan liquid Dopirydamole Methcarbomol with Aspirin Synalgos  ASA tablets/Enseals Disalcid Micrainin Tagament  Ascriptin Doan's Midol Talwin  Ascriptin A/D Dolene Mobidin Tanderil  Ascriptin Extra Strength Dolobid Moblgesic Ticlid  Ascriptin with Codeine Doloprin or Doloprin with Codeine Momentum Tolectin  Asperbuf Duoprin Mono-gesic Trendar  Aspergum Duradyne Motrin or Motrin IB Triminicin  Aspirin plain, buffered or enteric coated Durasal Myochrisine Trigesic  Aspirin Suppositories Easprin Nalfon Trillsate  Aspirin with Codeine Ecotrin Regular or Extra Strength Naprosyn Uracel  Atromid-S Efficin Naproxen Ursinus  Auranofin  Capsules Elmiron Neocylate Vanquish  Axotal Emagrin Norgesic Verin  Azathioprine Empirin or Empirin with Codeine Normiflo Vitamin E  Azolid Emprazil Nuprin Voltaren  Bayer Aspirin plain, buffered or children's or timed BC Tablets or powders Encaprin Orgaran Warfarin Sodium  Buff-a-Comp Enoxaparin Orudis Zorpin  Buff-a-Comp with Codeine Equegesic Os-Cal-Gesic   Buffaprin Excedrin plain, buffered or Extra Strength Oxalid   Bufferin Arthritis Strength Feldene Oxphenbutazone   Bufferin plain or Extra Strength Feldene Capsules Oxycodone with Aspirin   Bufferin with Codeine Fenoprofen Fenoprofen Pabalate or Pabalate-SF   Buffets II Flogesic Panagesic   Buffinol plain or Extra Strength Florinal or Florinal with Codeine Panwarfarin   Buf-Tabs Flurbiprofen Penicillamine   Butalbital Compound Four-way cold tablets Penicillin   Butazolidin Fragmin Pepto-Bismol   Carbenicillin Geminisyn Percodan   Carna Arthritis Reliever Geopen Persantine   Carprofen Gold's salt Persistin   Chloramphenicol Goody's Phenylbutazone   Chloromycetin Haltrain Piroxlcam   Clmetidine heparin Plaquenil   Cllnoril Hyco-pap Ponstel   Clofibrate Hydroxy chloroquine Propoxyphen         Before stopping any of these medications, be sure to consult the physician who ordered them.  Some, such as Coumadin (Warfarin) are ordered to prevent or treat serious conditions such as "deep thrombosis", "pumonary embolisms", and other heart problems.  The amount of time that you may need off of the medication may also vary with the medication and the reason for which you were taking it.  If you are taking any of these medications, please make sure you notify your pain physician before you undergo any procedures.          Facet Joint Block The facet joints connect the bones of the spine (vertebrae). They make it possible for you to bend, twist, and make other movements with your spine. They also keep you from bending too far, twisting  too far, and making other excessive movements. A facet joint block is a procedure where a numbing medicine (anesthetic) is injected into a facet joint. Often, a type of anti-inflammatory medicine called a steroid is also injected. A facet joint block may be done to diagnose neck or back pain. If the pain gets better after a facet joint block, it means the pain is probably coming from the facet joint. If the pain does not get better, it  means the pain is probably not coming from the facet joint. A facet joint block may also be done to relieve neck or back pain caused by an inflamed facet joint. A facet joint block is only done to relieve pain if the pain does not improve with other methods, such as medicine, exercise programs, and physical therapy. Tell a health care provider about:  Any allergies you have.  All medicines you are taking, including vitamins, herbs, eye drops, creams, and over-the-counter medicines.  Any problems you or family members have had with anesthetic medicines.  Any blood disorders you have.  Any surgeries you have had.  Any medical conditions you have.  Whether you are pregnant or may be pregnant. What are the risks? Generally, this is a safe procedure. However, problems may occur, including:  Bleeding.  Injury to a nerve near the injection site.  Pain at the injection site.  Weakness or numbness in areas controlled by nerves near the injection site.  Infection.  Temporary fluid retention.  Allergic reactions to medicines or dyes.  Injury to other structures or organs near the injection site.  What happens before the procedure?  Follow instructions from your health care provider about eating or drinking restrictions.  Ask your health care provider about: ? Changing or stopping your regular medicines. This is especially important if you are taking diabetes medicines or blood thinners. ? Taking medicines such as aspirin and ibuprofen. These medicines  can thin your blood. Do not take these medicines before your procedure if your health care provider instructs you not to.  Do not take any new dietary supplements or medicines without asking your health care provider first.  Plan to have someone take you home after the procedure. What happens during the procedure?  You may need to remove your clothing and dress in an open-back gown.  The procedure will be done while you are lying on an X-ray table. You will most likely be asked to lie on your stomach, but you may be asked to lie in a different position if an injection will be made in your neck.  Machines will be used to monitor your oxygen levels, heart rate, and blood pressure.  If an injection will be made in your neck, an IV tube will be inserted into one of your veins. Fluids and medicine will flow directly into your body through the IV tube.  The area over the facet joint where the injection will be made will be cleaned with soap. The surrounding skin will be covered with clean drapes.  A numbing medicine (local anesthetic) will be applied to your skin. Your skin may sting or burn for a moment.  A video X-ray machine (fluoroscopy) will be used to locate the joint. In some cases, a CT scan may be used.  A contrast dye may be injected into the facet joint area to help locate the joint.  When the joint is located, an anesthetic will be injected into the joint through the needle.  Your health care provider will ask you whether you feel pain relief. If you do feel relief, a steroid may be injected to provide pain relief for a longer period of time. If you do not feel relief or feel only partial relief, additional injections of an anesthetic may be made in other facet joints.  The needle will be removed.  Your skin will be cleaned.  A bandage (dressing) will be applied over each injection site. The procedure may vary among health  care providers and hospitals. What happens after the  procedure?  You will be observed for 15-30 minutes before being allowed to go home. This information is not intended to replace advice given to you by your health care provider. Make sure you discuss any questions you have with your health care provider. Document Released: 03/08/2007 Document Revised: 11/18/2015 Document Reviewed: 07/13/2015 Elsevier Interactive Patient Education  [employer].

## 2017-09-13 NOTE — Progress Notes (Signed)
Safety precautions to be maintained throughout the outpatient stay will include: orient to surroundings, keep bed in low position, maintain call bell within reach at all times, provide assistance with transfer out of bed and ambulation.  

## 2017-09-14 ENCOUNTER — Telehealth: Payer: Self-pay | Admitting: Pain Medicine

## 2017-09-14 NOTE — Telephone Encounter (Signed)
Attempted to call patient, message left. 

## 2017-09-14 NOTE — Telephone Encounter (Signed)
Patient is sched for procedure appt on 11-29 for L-FCT AND SI BLOCK. would like to speak with nurse about this

## 2017-09-16 DIAGNOSIS — I10 Essential (primary) hypertension: Secondary | ICD-10-CM | POA: Diagnosis not present

## 2017-09-16 DIAGNOSIS — G500 Trigeminal neuralgia: Secondary | ICD-10-CM | POA: Diagnosis not present

## 2017-09-16 DIAGNOSIS — D509 Iron deficiency anemia, unspecified: Secondary | ICD-10-CM | POA: Diagnosis not present

## 2017-09-16 DIAGNOSIS — M545 Low back pain: Secondary | ICD-10-CM | POA: Diagnosis not present

## 2017-09-18 ENCOUNTER — Telehealth: Payer: Self-pay

## 2017-09-18 NOTE — Telephone Encounter (Signed)
Home health certification and plan received and placed in red folder for your review.

## 2017-09-19 NOTE — Telephone Encounter (Signed)
Reviewed certification form.  Need to clarify with pt what medications she is taking and correct out med list.  See form placed in box.

## 2017-09-20 NOTE — Telephone Encounter (Signed)
PPW in red folder Can you try to reach patient not able before I left office.

## 2017-09-20 NOTE — Telephone Encounter (Signed)
Left message to return call to our office. Need to speak to our office.

## 2017-09-25 ENCOUNTER — Ambulatory Visit
Admission: RE | Admit: 2017-09-25 | Discharge: 2017-09-25 | Disposition: A | Discharge status: Home / Self Care | Payer: Medicare Other | Source: Ambulatory Visit | Attending: Pain Medicine | Admitting: Pain Medicine

## 2017-09-25 ENCOUNTER — Telehealth: Payer: Self-pay

## 2017-09-25 ENCOUNTER — Telehealth: Payer: Self-pay | Admitting: Pain Medicine

## 2017-09-25 ENCOUNTER — Telehealth (HOSPITAL_COMMUNITY): Payer: Self-pay

## 2017-09-25 DIAGNOSIS — M545 Low back pain: Secondary | ICD-10-CM | POA: Insufficient documentation

## 2017-09-25 DIAGNOSIS — M4856XA Collapsed vertebra, not elsewhere classified, lumbar region, initial encounter for fracture: Secondary | ICD-10-CM | POA: Diagnosis not present

## 2017-09-25 DIAGNOSIS — G8929 Other chronic pain: Secondary | ICD-10-CM | POA: Insufficient documentation

## 2017-09-25 DIAGNOSIS — M5136 Other intervertebral disc degeneration, lumbar region: Secondary | ICD-10-CM | POA: Insufficient documentation

## 2017-09-25 DIAGNOSIS — M47816 Spondylosis without myelopathy or radiculopathy, lumbar region: Secondary | ICD-10-CM | POA: Diagnosis not present

## 2017-09-25 DIAGNOSIS — M961 Postlaminectomy syndrome, not elsewhere classified: Secondary | ICD-10-CM

## 2017-09-25 DIAGNOSIS — M4316 Spondylolisthesis, lumbar region: Secondary | ICD-10-CM | POA: Insufficient documentation

## 2017-09-25 NOTE — Telephone Encounter (Signed)
Copied from [place]. Topic: Inquiry >> Sep 25, 2017  9:59 AM Malena Catholic I, NT wrote: Reason for CRM: Pt call for Doctor scott Nurse she said she miss her call

## 2017-09-25 NOTE — Telephone Encounter (Signed)
Copied from [place]. Topic: Inquiry >> Sep 25, 2017  9:59 AM Malena Catholic I, NT wrote: Reason for CRM: Pt call for Doctor scott Nurse she said she miss her call   >> Sep 25, 2017  4:22 PM Burnis Medin, NT wrote: Pt. Called back to speak to Dr Nicki Reaper nurse. Pt said she left a message for her to call the office back.

## 2017-09-25 NOTE — Telephone Encounter (Signed)
Corrections made and form signed and placed in box.

## 2017-09-25 NOTE — Telephone Encounter (Signed)
Spoke with patient and briefed her re; procedure she is having on Thursday, BLF.  Patient verbalizes u/o information.  States that she had xrays done today.

## 2017-09-25 NOTE — Telephone Encounter (Signed)
Patient has appt on thurs for procedure and wants to talk with a nurse before she comes in. Has some questions as to how it goes. She has a dentist appt so please call later today

## 2017-09-25 NOTE — Telephone Encounter (Signed)
Attempted to return call. Phone busy.

## 2017-09-25 NOTE — Telephone Encounter (Unsigned)
Copied from [place]. Topic: Inquiry >> Sep 25, 2017  9:59 AM Malena Catholic I, NT wrote: Reason for CRM: Pt call for Doctor scott Nurse she said she miss her call

## 2017-09-26 DIAGNOSIS — M545 Low back pain: Secondary | ICD-10-CM | POA: Diagnosis not present

## 2017-09-26 DIAGNOSIS — I10 Essential (primary) hypertension: Secondary | ICD-10-CM | POA: Diagnosis not present

## 2017-09-26 DIAGNOSIS — G500 Trigeminal neuralgia: Secondary | ICD-10-CM | POA: Diagnosis not present

## 2017-09-26 DIAGNOSIS — D509 Iron deficiency anemia, unspecified: Secondary | ICD-10-CM | POA: Diagnosis not present

## 2017-09-26 NOTE — Telephone Encounter (Signed)
Forms faxed

## 2017-09-27 NOTE — Progress Notes (Signed)
Patient's Name: Denise Barry  MRN: [id number]  Referring Provider: Einar Pheasant, MD  DOB: Jan 03, 1934  PCP: Einar Pheasant, MD  DOS: 09/28/2017  Note by: Gaspar Cola, MD  Service setting: Ambulatory outpatient  Specialty: Interventional Pain Management  Patient type: Established  Location: [facility] (AMB) Pain Management Facility  Visit type: Interventional Procedure   Primary Reason for Visit: Interventional Pain Management Treatment. CC: Back Pain (low, bilateral)  Procedure:  Anesthesia, Analgesia, Anxiolysis:  Procedure #1: Type: Diagnostic Medial Branch Facet Block #1 Region: Lumbar Level: L2, L3, L4, L5, & S1 Medial Branch Level(s) Laterality: Bilateral  Procedure #2: Type: Diagnostic Sacroiliac Joint Block #1 Region: Posterior Lumbosacral Level: PSIS (Posterior Superior Iliac Spine) Sacroiliac Joint Laterality: Bilateral  Type: Local Anesthesia with Moderate (Conscious) Sedation Local Anesthetic: Lidocaine 1% Route: Intravenous (IV) IV Access: Secured Sedation: Meaningful verbal contact was maintained at all times during the procedure  Indication(s): Analgesia and Anxiety   Indications: 1. Chronic low back pain (Primary Area of Pain)  (Bilateral) (L>R)   2. Chronic sacroiliac joint pain (Bilateral) (L>R)   3. Lumbar facet syndrome (Bilateral) (L>R)   4. Lumbar Spondylosis   5. Grade 1 Retrolisthesis of L3 over L4   6. Grade 1 Anterolisthesis of L4 over L5   7. Failed back surgical syndrome (L4-5 fusion)   8. Chronic bilateral low back pain without sciatica   9. Chronic sacroiliac joint pain   10. Facet syndrome, lumbar   11. Primary osteoarthritis of lumbar spine   12. Retrolisthesis   13. Anterolisthesis   14. Failed back surgical syndrome    Pain Score: Pre-procedure: 4 /10 Post-procedure: 0-No pain/10  Pre-op Assessment:  Denise Barry is a 83 y.o. (year old), female patient, seen today for interventional treatment. She  has a past surgical  history that includes Back surgery (09/16/13); sclerosis (2000); Dilation and curettage of uterus (1971); Tubal ligation (1972); and Joint replacement. Denise Barry has a current medication list which includes the following prescription(s): acetaminophen, amlodipine, aspirin, baclofen, bisacodyl, eslicarbazepine acetate, estradiol, fexofenadine, magnesium oxide, multi-vitamins, and tizanidine, and the following Facility-Administered Medications: fentanyl, lactated ringers, and midazolam. Her primarily concern today is the Back Pain (low, bilateral)  Initial Vital Signs: There were no vitals taken for this visit. BMI: Estimated body mass index is 25.79 kg/m as calculated from the following:   Height as of this encounter: 5\' 5"  (1.651 m).   Weight as of this encounter: 155 lb (70.3 kg).  Risk Assessment: Allergies: Reviewed. She is allergic to codeine; cyclobenzaprine; methocarbamol; carbamazepine; and gabapentin.  Allergy Precautions: None required Coagulopathies: Reviewed. None identified.  Blood-thinner therapy: None at this time Active Infection(s): Reviewed. None identified. Denise Barry is afebrile  Site Confirmation: Denise Barry was asked to confirm the procedure and laterality before marking the site Procedure checklist: Completed Consent: Before the procedure and under the influence of no sedative(s), amnesic(s), or anxiolytics, the patient was informed of the treatment options, risks and possible complications. To fulfill our ethical and legal obligations, as recommended by the American Medical Association's Code of Ethics, I have informed the patient of my clinical impression; the nature and purpose of the treatment or procedure; the risks, benefits, and possible complications of the intervention; the alternatives, including doing nothing; the risk(s) and benefit(s) of the alternative treatment(s) or procedure(s); and the risk(s) and benefit(s) of doing nothing. The patient was provided  information about the general risks and possible complications associated with the procedure. These may include, but are not limited to:  failure to achieve desired goals, infection, bleeding, organ or nerve damage, allergic reactions, paralysis, and death. In addition, the patient was informed of those risks and complications associated to Spine-related procedures, such as failure to decrease pain; infection (i.e.: Meningitis, epidural or intraspinal abscess); bleeding (i.e.: epidural hematoma, subarachnoid hemorrhage, or any other type of intraspinal or peri-dural bleeding); organ or nerve damage (i.e.: Any type of peripheral nerve, nerve root, or spinal cord injury) with subsequent damage to sensory, motor, and/or autonomic systems, resulting in permanent pain, numbness, and/or weakness of one or several areas of the body; allergic reactions; (i.e.: anaphylactic reaction); and/or death. Furthermore, the patient was informed of those risks and complications associated with the medications. These include, but are not limited to: allergic reactions (i.e.: anaphylactic or anaphylactoid reaction(s)); adrenal axis suppression; blood sugar elevation that in diabetics may result in ketoacidosis or comma; water retention that in patients with history of congestive heart failure may result in shortness of breath, pulmonary edema, and decompensation with resultant heart failure; weight gain; swelling or edema; medication-induced neural toxicity; particulate matter embolism and blood vessel occlusion with resultant organ, and/or nervous system infarction; and/or aseptic necrosis of one or more joints. Finally, the patient was informed that Medicine is not an exact science; therefore, there is also the possibility of unforeseen or unpredictable risks and/or possible complications that may result in a catastrophic outcome. The patient indicated having understood very clearly. We have given the patient no guarantees and we  have made no promises. Enough time was given to the patient to ask questions, all of which were answered to the patient's satisfaction. Denise Barry has indicated that she wanted to continue with the procedure. Attestation: I, the ordering provider, attest that I have discussed with the patient the benefits, risks, side-effects, alternatives, likelihood of achieving goals, and potential problems during recovery for the procedure that I have provided informed consent. Date: 09/28/2017; Time: 7:41 PM  Pre-Procedure Preparation:  Monitoring: As per clinic protocol. Respiration, ETCO2, SpO2, BP, heart rate and rhythm monitor placed and checked for adequate function Safety Precautions: Patient was assessed for positional comfort and pressure points before starting the procedure. Time-out: I initiated and conducted the "Time-out" before starting the procedure, as per protocol. The patient was asked to participate by confirming the accuracy of the "Time Out" information. Verification of the correct person, site, and procedure were performed and confirmed by me, the nursing staff, and the patient. "Time-out" conducted as per Joint Commission's Universal Protocol (UP.01.01.01). "Time-out" Date & Time: 09/28/2017; 0949 hrs.  Description of Procedure #1 Process:   Time-out: "Time-out" completed before starting procedure, as per protocol. Position: Prone Target Area: For Lumbar Facet blocks, the target is the groove formed by the junction of the transverse process and superior articular process. For the L5 dorsal ramus, the target is the notch between superior articular process and sacral ala. For the S1 dorsal ramus, the target is the superior and lateral edge of the posterior S1 Sacral foramen. Approach: Paramedial approach. Area Prepped: Entire Posterior Lumbosacral Region Prepping solution: ChloraPrep (2% chlorhexidine gluconate and 70% isopropyl alcohol) Safety Precautions: Aspiration looking for blood  return was conducted prior to all injections. At no point did we inject any substances, as a needle was being advanced. No attempts were made at seeking any paresthesias. Safe injection practices and needle disposal techniques used. Medications properly checked for expiration dates. SDV (single dose vial) medications used.  Description of the Procedure: Protocol guidelines were followed. The patient was placed  in position over the fluoroscopy table. The target area was identified and the area prepped in the usual manner. Skin desensitized using vapocoolant spray. Skin & deeper tissues infiltrated with local anesthetic. Appropriate amount of time allowed to pass for local anesthetics to take effect. The procedure needle was introduced through the skin, ipsilateral to the reported pain, and advanced to the target area. Employing the "Medial Branch Technique", the needles were advanced to the angle made by the superior and medial portion of the transverse process, and the lateral and inferior portion of the superior articulating process of the targeted vertebral bodies. This area is known as "Burton's Eye" or the "Eye of the [place] Dog". A procedure needle was introduced through the skin, and this time advanced to the angle made by the superior and medial border of the sacral ala, and the lateral border of the S1 vertebral body. This last needle was later repositioned at the superior and lateral border of the posterior S1 foramen. Negative aspiration confirmed. Solution injected in intermittent fashion, asking for systemic symptoms every 0.5cc of injectate. The needles were then removed and the area cleansed, making sure to leave some of the prepping solution back to take advantage of its long term bactericidal properties. Start Time: 0949 hrs. Materials:  Needle(s) Type: Regular needle Gauge: 22G Length: 3.5-in Medication(s): We administered lactated ringers, midazolam, fentaNYL, lidocaine, triamcinolone  acetonide, ropivacaine (PF) 2 mg/mL (0.2%), triamcinolone acetonide, ropivacaine (PF) 2 mg/mL (0.2%), ropivacaine (PF) 2 mg/mL (0.2%), and methylPREDNISolone acetate. Please see chart orders for dosing details.  Description of Procedure # 2 Process:   Position: Prone Target Area: For upper sacroiliac joint block(s), the target is the superior and posterior margin of the sacroiliac joint. Approach: Ipsilateral approach. Area Prepped: Entire Posterior Lumbosacral Region Prepping solution: ChloraPrep (2% chlorhexidine gluconate and 70% isopropyl alcohol) Safety Precautions: Aspiration looking for blood return was conducted prior to all injections. At no point did we inject any substances, as a needle was being advanced. No attempts were made at seeking any paresthesias. Safe injection practices and needle disposal techniques used. Medications properly checked for expiration dates. SDV (single dose vial) medications used. Description of the Procedure: Protocol guidelines were followed. The patient was placed in position over the fluoroscopy table. The target area was identified and the area prepped in the usual manner. Skin desensitized using vapocoolant spray. Skin & deeper tissues infiltrated with local anesthetic. Appropriate amount of time allowed to pass for local anesthetics to take effect. The procedure needle was advanced under fluoroscopic guidance into the sacroiliac joint until a firm endpoint was obtained. Proper needle placement secured. Negative aspiration confirmed. Solution injected in intermittent fashion, asking for systemic symptoms every 0.5cc of injectate. The needles were then removed and the area cleansed, making sure to leave some of the prepping solution back to take advantage of its long term bactericidal properties. Vitals:   09/28/17 1013 09/28/17 1018 09/28/17 1032 09/28/17 1041  BP: (!) 172/78 (!) 145/79 (!) 137/92 93/77  Pulse: (!) 105     Resp: 16 15 16  (!) 23  Temp:  97.8  F (36.6 C)    TempSrc:  Temporal    SpO2: 98% 97% 99% 97%  Weight:      Height:        End Time: 1010 hrs. Materials:  Needle(s) Type: Regular needle Gauge: 22G Length: 3.5-in Medication(s): We administered lactated ringers, midazolam, fentaNYL, lidocaine, triamcinolone acetonide, ropivacaine (PF) 2 mg/mL (0.2%), triamcinolone acetonide, ropivacaine (PF) 2 mg/mL (0.2%), ropivacaine (  PF) 2 mg/mL (0.2%), and methylPREDNISolone acetate. Please see chart orders for dosing details.  Imaging Guidance (Spinal):  Type of Imaging Technique: Fluoroscopy Guidance (Spinal) Indication(s): Assistance in needle guidance and placement for procedures requiring needle placement in or near specific anatomical locations not easily accessible without such assistance. Exposure Time: Please see nurses notes. Contrast: None used. Fluoroscopic Guidance: I was personally present during the use of fluoroscopy. "Tunnel Vision Technique" used to obtain the best possible view of the target area. Parallax error corrected before commencing the procedure. "Direction-depth-direction" technique used to introduce the needle under continuous pulsed fluoroscopy. Once target was reached, antero-posterior, oblique, and lateral fluoroscopic projection used confirm needle placement in all planes. Images permanently stored in EMR. Interpretation: No contrast injected. I personally interpreted the imaging intraoperatively. Adequate needle placement confirmed in multiple planes. Permanent images saved into the patient's record.  Antibiotic Prophylaxis:  Indication(s): None identified Antibiotic given: None  Post-operative Assessment:  EBL: None Complications: No immediate post-treatment complications observed by team, or reported by patient. Note: The patient tolerated the entire procedure well. A repeat set of vitals were taken after the procedure and the patient was kept under observation following institutional policy, for this  type of procedure. Post-procedural neurological assessment was performed, showing return to baseline, prior to discharge. The patient was provided with post-procedure discharge instructions, including a section on how to identify potential problems. Should any problems arise concerning this procedure, the patient was given instructions to immediately contact us, at any time, without hesitation. In any case, we plan to contact the patient by telephone for a follow-up status report regarding this interventional procedure. Comments:  No additional relevant information.  Plan of Care    Imaging Orders     DG C-Arm 1-60 Min-No Report  Procedure Orders     LUMBAR FACET(MEDIAL BRANCH NERVE BLOCK) MBNB     SACROILIAC JOINT INJECTION  Medications ordered for procedure: Meds ordered this encounter  Medications  . lactated ringers infusion 1,000 mL  . midazolam (VERSED) 5 MG/5ML injection 1-2 mg    Make sure Flumazenil is available in the pyxis when using this medication. If oversedation occurs, administer 0.2 mg IV over 15 sec. If after 45 sec no response, administer 0.2 mg again over 1 min; may repeat at 1 min intervals; not to exceed 4 doses (1 mg)  . fentaNYL (SUBLIMAZE) injection 25-50 mcg    Make sure Narcan is available in the pyxis when using this medication. In the event of respiratory depression (RR< 8/min): Titrate NARCAN (naloxone) in increments of 0.1 to 0.2 mg IV at 2-3 minute intervals, until desired degree of reversal.  . lidocaine (XYLOCAINE) 2 % (with pres) injection 200 mg  . triamcinolone acetonide (KENALOG-40) injection 40 mg  . ropivacaine (PF) 2 mg/mL (0.2%) (NAROPIN) injection 9 mL  . triamcinolone acetonide (KENALOG-40) injection 40 mg  . ropivacaine (PF) 2 mg/mL (0.2%) (NAROPIN) injection 9 mL  . ropivacaine (PF) 2 mg/mL (0.2%) (NAROPIN) injection 9 mL  . methylPREDNISolone acetate (DEPO-MEDROL) injection 80 mg   Medications administered: We administered lactated  ringers, midazolam, fentaNYL, lidocaine, triamcinolone acetonide, ropivacaine (PF) 2 mg/mL (0.2%), triamcinolone acetonide, ropivacaine (PF) 2 mg/mL (0.2%), ropivacaine (PF) 2 mg/mL (0.2%), and methylPREDNISolone acetate.  See the medical record for exact dosing, route, and time of administration.  This SmartLink is deprecated. Use AVSMEDLIST instead to display the medication list for a patient. Disposition: Discharge home  Discharge Date & Time: 09/28/2017; 1044 hrs.   Physician-requested Follow-up: Return for  post-procedure eval by Dr. Dossie Arbour in 2 wks. Future Appointments  Date Time Provider [place]  10/16/2017  1:00 PM Dia Crawford, LPN [facility]  [id number]  9:15 AM Milinda Pointer, MD [facility] None  01/15/2018  9:40 AM [facility]   Primary Care Physician: Einar Pheasant, MD Location: [facility] Out[facility] Note by: Gaspar Cola, MD Date: 09/28/2017; Time: 10:45 AM  Disclaimer:  Medicine is not an Chief Strategy Officer. The only guarantee in medicine is that nothing is guaranteed. It is important to note that the decision to proceed with this intervention was based on the information collected from the patient. The Data and conclusions were drawn from the patient's questionnaire, the interview, and the physical examination. Because the information was provided in large part by the patient, it cannot be guaranteed that it has not been purposely or unconsciously manipulated. Every effort has been made to obtain as much relevant data as possible for this evaluation. It is important to note that the conclusions that lead to this procedure are derived in large part from the available data. Always take into account that the treatment will also be dependent on availability of resources and existing treatment guidelines, considered by other Pain Management Practitioners as being common knowledge and practice, at the time of the  intervention. For Medico-Legal purposes, it is also important to point out that variation in procedural techniques and pharmacological choices are the acceptable norm. The indications, contraindications, technique, and results of the above procedure should only be interpreted and judged by a Board-Certified Interventional Pain Specialist with extensive familiarity and expertise in the same exact procedure and technique.

## 2017-09-28 ENCOUNTER — Telehealth: Payer: Self-pay | Admitting: Internal Medicine

## 2017-09-28 ENCOUNTER — Other Ambulatory Visit: Payer: Self-pay

## 2017-09-28 ENCOUNTER — Ambulatory Visit
Admission: RE | Admit: 2017-09-28 | Discharge: 2017-09-28 | Disposition: A | Discharge status: Home / Self Care | Payer: Medicare Other | Source: Ambulatory Visit | Attending: Pain Medicine | Admitting: Pain Medicine

## 2017-09-28 ENCOUNTER — Ambulatory Visit (HOSPITAL_BASED_OUTPATIENT_CLINIC_OR_DEPARTMENT_OTHER): Payer: Medicare Other | Admitting: Pain Medicine

## 2017-09-28 ENCOUNTER — Encounter: Payer: Self-pay | Admitting: Pain Medicine

## 2017-09-28 VITALS — BP 93/77 | HR 105 | Temp 97.8°F | Resp 23 | Ht 65.0 in | Wt 155.0 lb

## 2017-09-28 DIAGNOSIS — M961 Postlaminectomy syndrome, not elsewhere classified: Secondary | ICD-10-CM

## 2017-09-28 DIAGNOSIS — M545 Low back pain, unspecified: Secondary | ICD-10-CM

## 2017-09-28 DIAGNOSIS — G8929 Other chronic pain: Secondary | ICD-10-CM | POA: Diagnosis not present

## 2017-09-28 DIAGNOSIS — M533 Sacrococcygeal disorders, not elsewhere classified: Secondary | ICD-10-CM | POA: Diagnosis not present

## 2017-09-28 DIAGNOSIS — M47816 Spondylosis without myelopathy or radiculopathy, lumbar region: Secondary | ICD-10-CM | POA: Insufficient documentation

## 2017-09-28 DIAGNOSIS — Z885 Allergy status to narcotic agent status: Secondary | ICD-10-CM | POA: Diagnosis not present

## 2017-09-28 DIAGNOSIS — M4310 Spondylolisthesis, site unspecified: Secondary | ICD-10-CM

## 2017-09-28 MED ORDER — FENTANYL CITRATE (PF) 100 MCG/2ML IJ SOLN
25.0000 ug | INTRAMUSCULAR | Status: DC | PRN
  Administered 2017-09-28: 50 ug via INTRAVENOUS
  Filled 2017-09-28: qty 2

## 2017-09-28 MED ORDER — LIDOCAINE HCL 2 % IJ SOLN
10.0000 mL | Freq: Once | INTRAMUSCULAR | Status: AC
  Administered 2017-09-28: 200 mg
  Filled 2017-09-28: qty 20

## 2017-09-28 MED ORDER — TRIAMCINOLONE ACETONIDE 40 MG/ML IJ SUSP
40.0000 mg | Freq: Once | INTRAMUSCULAR | Status: AC
  Administered 2017-09-28: 40 mg
  Filled 2017-09-28: qty 1

## 2017-09-28 MED ORDER — ROPIVACAINE HCL 2 MG/ML IJ SOLN
9.0000 mL | Freq: Once | INTRAMUSCULAR | Status: AC
  Administered 2017-09-28: 99 mL via PERINEURAL
  Filled 2017-09-28: qty 10

## 2017-09-28 MED ORDER — LACTATED RINGERS IV SOLN
1000.0000 mL | Freq: Once | INTRAVENOUS | Status: AC
  Administered 2017-09-28: 1000 mL via INTRAVENOUS

## 2017-09-28 MED ORDER — METHYLPREDNISOLONE ACETATE 80 MG/ML IJ SUSP
80.0000 mg | Freq: Once | INTRAMUSCULAR | Status: AC
  Administered 2017-09-28: 80 mg via INTRA_ARTICULAR
  Filled 2017-09-28: qty 1

## 2017-09-28 MED ORDER — MIDAZOLAM HCL 5 MG/5ML IJ SOLN
1.0000 mg | INTRAMUSCULAR | Status: DC | PRN
  Administered 2017-09-28: 1 mg via INTRAVENOUS
  Filled 2017-09-28: qty 5

## 2017-09-28 MED ORDER — ROPIVACAINE HCL 2 MG/ML IJ SOLN
9.0000 mL | Freq: Once | INTRAMUSCULAR | Status: AC
  Administered 2017-09-28: 9 mL via INTRA_ARTICULAR
  Filled 2017-09-28: qty 10

## 2017-09-28 MED ORDER — ROPIVACAINE HCL 2 MG/ML IJ SOLN
9.0000 mL | Freq: Once | INTRAMUSCULAR | Status: AC
  Administered 2017-09-28: 9 mL via PERINEURAL
  Filled 2017-09-28: qty 10

## 2017-09-28 NOTE — Telephone Encounter (Signed)
Patient is aware and says she will call pain management

## 2017-09-28 NOTE — Telephone Encounter (Signed)
If seeing pain clinic, she will need to get all pain meds through them.

## 2017-09-28 NOTE — Progress Notes (Signed)
Safety precautions to be maintained throughout the outpatient stay will include: orient to surroundings, keep bed in low position, maintain call bell within reach at all times, provide assistance with transfer out of bed and ambulation.  

## 2017-09-28 NOTE — Telephone Encounter (Signed)
Copied from [place] [phone]. Topic: Quick Communication - See Telephone Encounter >> Sep 28, 2017  1:46 PM Ether Griffins B wrote: CRM for notification. See Telephone encounter for:  Pt spoke with someone last week about getting tramadol refilled the pharmacy hasnt received anything. [facility]. Pt would like to be called as soon as its called in she is hoping it will be taken care of today.  09/28/17.

## 2017-09-28 NOTE — Patient Instructions (Signed)

## 2017-09-28 NOTE — Telephone Encounter (Signed)
Patient is requesting Tramadol for chronic back pain. It looks like the prescription was discontinued in Sept. Patient is seeing the pain clinic. I do not see where patient requested this medication last week.

## 2017-09-29 ENCOUNTER — Telehealth: Payer: Self-pay

## 2017-09-29 ENCOUNTER — Other Ambulatory Visit: Payer: Self-pay

## 2017-09-29 MED ORDER — TRAMADOL HCL 50 MG PO TABS: 50.0000 mg | ORAL_TABLET | Freq: Two times a day (BID) | ORAL | 0 refills | Status: DC | PRN

## 2017-09-29 NOTE — Telephone Encounter (Signed)
Post procedure phone call.  Patient states she is doing good, having some pain at times and would like for Dr Dossie Arbour to order her pain medicine for back up.  Will speak to Dr Dossie Arbour.

## 2017-09-29 NOTE — Telephone Encounter (Signed)
Orders received to call in Tramadol 50 mg.  1 tablet bid prn #15.  Called into [facility].  Patient notified.

## 2017-10-02 ENCOUNTER — Telehealth: Payer: Self-pay | Admitting: Pain Medicine

## 2017-10-02 NOTE — Telephone Encounter (Signed)
Spoke with patient re; stomach upset.  She has had an upset stomach since Friday and procedure was on Thursday.  Patient denies fever or N/V.  Patient speech sounded slurred to me, her son was at home with her so I asked to speak with him to verify s/s patient is c/o.  She does say she has been drinking fluids and using Tylenol and occassional antidiarrheal medicine.  I did offer for patient to go to the ED or if she would like we could see her tomorrow.  Patient will wait until tomorrow.  I have spoken with Crystal, NP and upon her advice have instructed Denise Barry to get some gatorade and try and take at least 30 cc/ hour and to use BRAT type diet.  Patient verbalizes u/o information.  I will call he in the morning to see how she is and if she needs to be seen.  I will address being unable to get Dr Dossie Arbour with Cherly Anderson RN

## 2017-10-02 NOTE — Telephone Encounter (Addendum)
Patient states she is having complications from procedure and wants to talk with nurse or Dr. Dossie Arbour. Has had upset stomach and mucus. Very upset she could not reach [place] this weekend. She left msg 2 times on Saturday. Says she tried to call emergency number but they had no idea how to help her and would not page Dr. Dossie Arbour Patient left another vmail asking someone to call ASAP.

## 2017-10-03 NOTE — Telephone Encounter (Signed)
Spoke with patient and she is feeling better this morning, but still weak.  Is continuing to use Gatorade and advancing diet as tolerated.  States she did sleep a couple of hours this morning.  I was able to ask her about which numbers she used this weekend, first she called the office number and then proceeded with hospital number who told her they could not help her or didn't know how to help her and turned her over the ED which told her they had no Dr's on call for her situation.   Patient will continue to monitor her situation and call if things do not continue to improve.

## 2017-10-05 ENCOUNTER — Emergency Department: Payer: Medicare Other

## 2017-10-05 ENCOUNTER — Other Ambulatory Visit: Payer: Self-pay

## 2017-10-05 ENCOUNTER — Other Ambulatory Visit
Admission: RE | Admit: 2017-10-05 | Discharge: 2017-10-05 | Disposition: A | Discharge status: Home / Self Care | Payer: Medicare Other | Source: Ambulatory Visit | Attending: Nurse Practitioner | Admitting: Nurse Practitioner

## 2017-10-05 ENCOUNTER — Ambulatory Visit (HOSPITAL_BASED_OUTPATIENT_CLINIC_OR_DEPARTMENT_OTHER): Payer: Medicare Other | Admitting: Nurse Practitioner

## 2017-10-05 ENCOUNTER — Inpatient Hospital Stay
Admission: EM | Admit: 2017-10-05 | Discharge: 2017-10-11 | DRG: 392 | Disposition: A | Discharge status: Home / Self Care | Payer: Medicare Other | Attending: Surgery | Admitting: Surgery

## 2017-10-05 ENCOUNTER — Encounter: Payer: Self-pay | Admitting: Nurse Practitioner

## 2017-10-05 VITALS — BP 107/64 | HR 92 | Temp 98.1°F | Resp 16 | Ht 65.5 in | Wt 155.0 lb

## 2017-10-05 DIAGNOSIS — D62 Acute posthemorrhagic anemia: Secondary | ICD-10-CM | POA: Diagnosis present

## 2017-10-05 DIAGNOSIS — R531 Weakness: Secondary | ICD-10-CM

## 2017-10-05 DIAGNOSIS — D500 Iron deficiency anemia secondary to blood loss (chronic): Secondary | ICD-10-CM | POA: Diagnosis present

## 2017-10-05 DIAGNOSIS — Z7982 Long term (current) use of aspirin: Secondary | ICD-10-CM | POA: Diagnosis not present

## 2017-10-05 DIAGNOSIS — I10 Essential (primary) hypertension: Secondary | ICD-10-CM | POA: Diagnosis present

## 2017-10-05 DIAGNOSIS — Z885 Allergy status to narcotic agent status: Secondary | ICD-10-CM

## 2017-10-05 DIAGNOSIS — Z888 Allergy status to other drugs, medicaments and biological substances status: Secondary | ICD-10-CM

## 2017-10-05 DIAGNOSIS — D509 Iron deficiency anemia, unspecified: Secondary | ICD-10-CM | POA: Diagnosis present

## 2017-10-05 DIAGNOSIS — R1084 Generalized abdominal pain: Secondary | ICD-10-CM

## 2017-10-05 DIAGNOSIS — D72828 Other elevated white blood cell count: Secondary | ICD-10-CM | POA: Diagnosis not present

## 2017-10-05 DIAGNOSIS — G8929 Other chronic pain: Secondary | ICD-10-CM | POA: Diagnosis not present

## 2017-10-05 DIAGNOSIS — K5720 Diverticulitis of large intestine with perforation and abscess without bleeding: Secondary | ICD-10-CM | POA: Diagnosis not present

## 2017-10-05 DIAGNOSIS — R109 Unspecified abdominal pain: Secondary | ICD-10-CM

## 2017-10-05 DIAGNOSIS — K5730 Diverticulosis of large intestine without perforation or abscess without bleeding: Secondary | ICD-10-CM | POA: Diagnosis not present

## 2017-10-05 DIAGNOSIS — K5792 Diverticulitis of intestine, part unspecified, without perforation or abscess without bleeding: Secondary | ICD-10-CM | POA: Diagnosis not present

## 2017-10-05 DIAGNOSIS — D72829 Elevated white blood cell count, unspecified: Secondary | ICD-10-CM | POA: Insufficient documentation

## 2017-10-05 DIAGNOSIS — R32 Unspecified urinary incontinence: Secondary | ICD-10-CM | POA: Diagnosis present

## 2017-10-05 DIAGNOSIS — M1991 Primary osteoarthritis, unspecified site: Secondary | ICD-10-CM | POA: Diagnosis present

## 2017-10-05 DIAGNOSIS — M545 Low back pain, unspecified: Secondary | ICD-10-CM

## 2017-10-05 DIAGNOSIS — D649 Anemia, unspecified: Secondary | ICD-10-CM

## 2017-10-05 DIAGNOSIS — G894 Chronic pain syndrome: Secondary | ICD-10-CM | POA: Diagnosis present

## 2017-10-05 DIAGNOSIS — D72821 Monocytosis (symptomatic): Secondary | ICD-10-CM

## 2017-10-05 DIAGNOSIS — Z79899 Other long term (current) drug therapy: Secondary | ICD-10-CM | POA: Diagnosis not present

## 2017-10-05 DIAGNOSIS — Z87891 Personal history of nicotine dependence: Secondary | ICD-10-CM | POA: Diagnosis not present

## 2017-10-05 DIAGNOSIS — K5732 Diverticulitis of large intestine without perforation or abscess without bleeding: Secondary | ICD-10-CM | POA: Diagnosis not present

## 2017-10-05 LAB — COMPREHENSIVE METABOLIC PANEL
ALBUMIN: 2.9 g/dL — AB (ref 3.5–5.0)
ALK PHOS: 152 U/L — AB (ref 38–126)
ALT: 45 U/L (ref 14–54)
ANION GAP: 11 (ref 5–15)
AST: 25 U/L (ref 15–41)
BILIRUBIN TOTAL: 0.5 mg/dL (ref 0.3–1.2)
BUN: 35 mg/dL — ABNORMAL HIGH (ref 6–20)
CALCIUM: 9.1 mg/dL (ref 8.9–10.3)
CO2: 23 mmol/L (ref 22–32)
Chloride: 102 mmol/L (ref 101–111)
Creatinine, Ser: 0.98 mg/dL (ref 0.44–1.00)
GFR calc Af Amer: >60 mL/min (ref >60)
GFR calc non Af Amer: 53 mL/min — ABNORMAL LOW (ref >60)
GLUCOSE: 95 mg/dL (ref 65–99)
POTASSIUM: 3.9 mmol/L (ref 3.5–5.1)
SODIUM: 136 mmol/L (ref 135–145)
TOTAL PROTEIN: 7.2 g/dL (ref 6.5–8.1)

## 2017-10-05 LAB — CBC WITH DIFFERENTIAL/PLATELET
BASOS ABS: 0 10*3/uL (ref 0–0.1)
Basophils Relative: 0 %
EOS PCT: 0 %
Eosinophils Absolute: 0 10*3/uL (ref 0–0.7)
HEMATOCRIT: 23.3 % — AB (ref 35.0–47.0)
Hemoglobin: 7.5 g/dL — ABNORMAL LOW (ref 12.0–16.0)
LYMPHS PCT: 2 %
Lymphs Abs: 0.6 10*3/uL — ABNORMAL LOW (ref 1.0–3.6)
MCH: 25.5 pg — AB (ref 26.0–34.0)
MCHC: 32.1 g/dL (ref 32.0–36.0)
MCV: 79.4 fL — AB (ref 80.0–100.0)
MONO ABS: 1.4 10*3/uL — AB (ref 0.2–0.9)
MONOS PCT: 5 %
NEUTROS ABS: 27.8 10*3/uL — AB (ref 1.4–6.5)
Neutrophils Relative %: 93 %
Platelets: 484 10*3/uL — ABNORMAL HIGH (ref 150–440)
RBC: 2.94 MIL/uL — ABNORMAL LOW (ref 3.80–5.20)
RDW: 18.1 % — AB (ref 11.5–14.5)
WBC: 29.8 10*3/uL — ABNORMAL HIGH (ref 3.6–11.0)

## 2017-10-05 MED ORDER — METRONIDAZOLE IN NACL 5-0.79 MG/ML-% IV SOLN: 500.0000 mg | Freq: Once | INTRAVENOUS | Status: DC

## 2017-10-05 MED ORDER — PANTOPRAZOLE SODIUM 40 MG IV SOLR
40.0000 mg | Freq: Every day | INTRAVENOUS | Status: DC
  Administered 2017-10-05 – 2017-10-10 (×4): 40 mg via INTRAVENOUS
  Filled 2017-10-05 (×5): qty 40

## 2017-10-05 MED ORDER — IOPAMIDOL (ISOVUE-300) INJECTION 61%
100.0000 mL | Freq: Once | INTRAVENOUS | Status: AC | PRN
  Administered 2017-10-05: 100 mL via INTRAVENOUS

## 2017-10-05 MED ORDER — HEPARIN SODIUM (PORCINE) 5000 UNIT/ML IJ SOLN
5000.0000 [IU] | Freq: Three times a day (TID) | INTRAMUSCULAR | Status: DC
  Administered 2017-10-05 – 2017-10-06 (×2): 5000 [IU] via SUBCUTANEOUS
  Filled 2017-10-05 (×2): qty 1

## 2017-10-05 MED ORDER — MORPHINE SULFATE (PF) 2 MG/ML IV SOLN
2.0000 mg | INTRAVENOUS | Status: DC | PRN
  Administered 2017-10-07 – 2017-10-08 (×2): 2 mg via INTRAVENOUS
  Filled 2017-10-05 (×2): qty 1

## 2017-10-05 MED ORDER — HYDRALAZINE HCL 20 MG/ML IJ SOLN: 10.0000 mg | INTRAMUSCULAR | Status: DC | PRN

## 2017-10-05 MED ORDER — AMLODIPINE BESYLATE 10 MG PO TABS
10.0000 mg | ORAL_TABLET | Freq: Every day | ORAL | Status: DC
  Administered 2017-10-06: 10 mg via ORAL
  Filled 2017-10-05: qty 1

## 2017-10-05 MED ORDER — ONDANSETRON HCL 4 MG/2ML IJ SOLN: 4.0000 mg | Freq: Four times a day (QID) | INTRAMUSCULAR | Status: DC | PRN

## 2017-10-05 MED ORDER — ONDANSETRON 4 MG PO TBDP
4.0000 mg | ORAL_TABLET | Freq: Four times a day (QID) | ORAL | Status: DC | PRN
Start: 2017-10-05 — End: 2017-10-11

## 2017-10-05 MED ORDER — DIPHENHYDRAMINE HCL 12.5 MG/5ML PO ELIX
12.5000 mg | ORAL_SOLUTION | Freq: Four times a day (QID) | ORAL | Status: DC | PRN
  Filled 2017-10-05: qty 5

## 2017-10-05 MED ORDER — IOPAMIDOL (ISOVUE-300) INJECTION 61%
30.0000 mL | Freq: Once | INTRAVENOUS | Status: AC
  Administered 2017-10-05: 30 mL via ORAL

## 2017-10-05 MED ORDER — CIPROFLOXACIN IN D5W 400 MG/200ML IV SOLN: 400.0000 mg | Freq: Once | INTRAVENOUS | Status: DC

## 2017-10-05 MED ORDER — DIPHENHYDRAMINE HCL 50 MG/ML IJ SOLN: 12.5000 mg | Freq: Four times a day (QID) | INTRAMUSCULAR | Status: DC | PRN

## 2017-10-05 MED ORDER — TIZANIDINE HCL 2 MG PO TABS
4.0000 mg | ORAL_TABLET | Freq: Three times a day (TID) | ORAL | Status: DC | PRN
  Administered 2017-10-06 (×3): 4 mg via ORAL
  Filled 2017-10-05 (×5): qty 1

## 2017-10-05 MED ORDER — ESLICARBAZEPINE ACETATE 400 MG PO TABS
1.0000 | ORAL_TABLET | Freq: Three times a day (TID) | ORAL | Status: DC
  Administered 2017-10-06 – 2017-10-11 (×16): 1 via ORAL
  Filled 2017-10-05 (×15): qty 1

## 2017-10-05 MED ORDER — PIPERACILLIN-TAZOBACTAM 3.375 G IVPB
3.3750 g | Freq: Three times a day (TID) | INTRAVENOUS | Status: DC
  Administered 2017-10-06 – 2017-10-11 (×16): 3.375 g via INTRAVENOUS
  Filled 2017-10-05 (×16): qty 50

## 2017-10-05 MED ORDER — PIPERACILLIN-TAZOBACTAM 3.375 G IVPB 30 MIN
3.3750 g | Freq: Once | INTRAVENOUS | Status: AC
  Administered 2017-10-05: 3.375 g via INTRAVENOUS
  Filled 2017-10-05: qty 50

## 2017-10-05 MED ORDER — DEXTROSE IN LACTATED RINGERS 5 % IV SOLN
INTRAVENOUS | Status: DC
  Administered 2017-10-05 – 2017-10-07 (×3): via INTRAVENOUS

## 2017-10-05 NOTE — ED Provider Notes (Signed)
[facility] Emergency Department Provider Note   ____________________________________________   I have reviewed the triage vital signs and the nursing notes.   HISTORY  Chief Complaint Abdominal Pain   History limited by: Not Limited   HPI Denise Barry is a 83 y.o. female who presents to the emergency department today because of elevated WBC count and anemia noted on outpatient labwork. Patient has complaint of abdominal pain, nausea and diarrhea.  LOCATION:generalized abdomen DURATION: weeks TIMING: constant SEVERITY: severe QUALITY: cramping CONTEXT: patient states she has had her symptoms for a couple of weeks. Has history of admission for colitis in the past.  MODIFYING FACTORS: none ASSOCIATED SYMPTOMS: has had non bloody diarrhea. Has had weakness for the past couple of weeks.  Per medical record review patient has a history of colitis.   Past Medical History:  Diagnosis Date  . Allergy   . Hypercholesterolemia   . Hypertension   . Osteoarthritis   . Urinary incontinence    pessary in place  . Varicose veins    H/O    Patient Active Problem List   Diagnosis Date Noted  . Abdominal pain 10/05/2017  . Elevated WBC count 10/05/2017  . Granulocytosis 10/05/2017  . Chronic musculoskeletal pain 09/13/2017  . Spasm of muscle of lower back (B) 09/13/2017  . Lumbar Spondylosis 09/13/2017  . Lumbar facet syndrome (Bilateral) (L>R) 09/13/2017  . Chronic sacroiliac joint pain (Bilateral) (L>R) 09/13/2017  . Grade 1 Anterolisthesis of L4 over L5 09/13/2017  . Grade 1 Retrolisthesis of L3 over L4 09/13/2017  . Old Compression fracture of L1 and L2 09/12/2017  . DDD (degenerative disc disease), thoracic 09/12/2017  . Scoliosis 09/12/2017  . Abnormal MRI, lumbar spine (07/25/2017) 09/12/2017  . Chronic low back pain (Primary Area of Pain)  (Bilateral) (L>R) 08/28/2017  . Chronic pain syndrome 08/28/2017  . Opiate use 08/28/2017  .  Other long term (current) drug therapy 08/28/2017  . Other specified health status 08/28/2017  . Other reduced mobility 08/28/2017  . Disorder of bone, unspecified 08/28/2017  . Syncope 07/08/2017  . Pyuria 06/10/2017  . Chronic anemia 06/10/2017  . SUNCT (short unilateral neuralgiform headache, conjunctival inj/tear) 07/09/2015  . History of shoulder fracture (Left) 06/03/2015  . Hyponatremia 05/07/2015  . Closed 2-part displaced fracture of surgical neck of left humerus 02/09/2015  . Health care maintenance 01/18/2015  . Right leg swelling 09/21/2014  . History of hip fracture (Left) 07/13/2014  . Chronic Trigeminal neuralgia (Right) 01/07/2014  . Essential hypertension, benign 01/07/2014  . Hypercholesterolemia 01/07/2014  . Failed back surgical syndrome (L4-5 fusion) 01/07/2014  . Urinary incontinence 01/07/2014  . Acquired spondylolisthesis 09/13/2013  . Spinal stenosis, unspecified region other than cervical 09/13/2013  . DDD (degenerative disc disease), lumbar 06/05/2013    Past Surgical History:  Procedure Laterality Date  . BACK SURGERY  09/16/13  . DILATION AND CURETTAGE OF UTERUS  1971  . JOINT REPLACEMENT    . sclerosis  2000  . TUBAL LIGATION  1972    Prior to Admission medications   Medication Sig Start Date End Date Taking? Authorizing Provider  Acetaminophen (TYLENOL EXTRA STRENGTH PO) Take 1-2 tablets by mouth daily.    [provider]  amLODipine (NORVASC) 10 MG tablet Take 1 tablet (10 mg total) by mouth daily. 07/05/17   Einar Pheasant, MD  aspirin 81 MG tablet Take 81 mg by mouth daily.    [provider]  baclofen (LIORESAL) 10 MG tablet Take 1 tablet (  10 mg total) 3 (three) times daily by mouth. 09/13/17 10/13/17  Milinda Pointer, MD  bisacodyl (DULCOLAX) 5 MG EC tablet Take 5 mg by mouth as needed.     [provider]  Eslicarbazepine Acetate (APTIOM PO) Take 1 tablet by mouth 2 (two) times daily.    [provider]   estradiol (ESTRACE VAGINAL) 0.1 MG/GM vaginal cream Apply PRN 05/22/16   [provider]  fexofenadine (ALLEGRA) 180 MG tablet Take 180 mg by mouth as needed for allergies or rhinitis.    [provider]  Magnesium Oxide 500 MG CAPS Take 1 capsule (500 mg total) at bedtime by mouth. 09/13/17 10/13/17  Milinda Pointer, MD  Multiple Vitamin (MULTI-VITAMINS) TABS Take 1 tablet by mouth.     [provider]  tiZANidine (ZANAFLEX) 4 MG tablet Take 1 tablet (4 mg total) 3 (three) times daily as needed by mouth for muscle spasms. 09/08/17   Einar Pheasant, MD  traMADol (ULTRAM) 50 MG tablet Take 1 tablet (50 mg total) by mouth 2 (two) times daily as needed for moderate pain. 09/29/17   Milinda Pointer, MD    Allergies Codeine; Cyclobenzaprine; Methocarbamol; Carbamazepine; and Gabapentin  Family History  Problem Relation Age of Onset  . Heart disease Mother   . Diabetes Mother   . Hypertension Mother   . Heart disease Father   . Stroke Father   . Breast cancer Neg Hx     Social History Social History   Tobacco Use  . Smoking status: Former Research scientist (life sciences)  . Smokeless tobacco: Never Used  Substance Use Topics  . Alcohol use: No    Alcohol/week: 0.0 oz  . Drug use: No    Review of Systems Constitutional: No fever/chills. Positive for weakness. Eyes: No visual changes. ENT: No sore throat. Cardiovascular: Denies chest pain. Respiratory: Denies shortness of breath. Gastrointestinal: Positive for abdominal pain and nausea.   Genitourinary: Negative for dysuria. Musculoskeletal: Negative for back pain. Skin: Negative for rash. Neurological: Negative for headaches, focal weakness or numbness.  ____________________________________________   PHYSICAL EXAM:  VITAL SIGNS: ED Triage Vitals  Enc Vitals Group     BP 10/05/17 1430 122/63     Pulse Rate 10/05/17 1430 (!) 114     Resp 10/05/17 1430 16     Temp 10/05/17 1430 97.9 F (36.6 C)     Temp Source  10/05/17 1430 Oral     SpO2 10/05/17 1430 97 %     Weight 10/05/17 1431 155 lb (70.3 kg)     Height 10/05/17 1431 5\' 3"  (1.6 m)     Head Circumference --      Peak Flow --      Pain Score 10/05/17 1430 6     Pain Loc --      Pain Edu? --      Excl. in [place]? --      Constitutional: Alert and oriented. Well appearing and in no distress. Eyes: Conjunctivae are normal.  ENT   Head: Normocephalic and atraumatic.   Nose: No congestion/rhinnorhea.   Mouth/Throat: Mucous membranes are moist.   Neck: No stridor. Hematological/Lymphatic/Immunilogical: No cervical lymphadenopathy. Cardiovascular: Normal rate, regular rhythm.  No murmurs, rubs, or gallops.  Respiratory: Normal respiratory effort without tachypnea nor retractions. Breath sounds are clear and equal bilaterally. No wheezes/rales/rhonchi. Gastrointestinal: Soft and diffusely tender to palpation. No rebound. No guarding. Genitourinary: Deferred Musculoskeletal: Normal range of motion in all extremities. No lower extremity edema. Neurologic:  Normal speech and language. No  gross focal neurologic deficits are appreciated.  Skin:  Skin is warm, dry and intact. No rash noted. Psychiatric: Mood and affect are normal. Speech and behavior are normal. Patient exhibits appropriate insight and judgment.  ____________________________________________    LABS (pertinent positives/negatives)  Outpatient lab work reviewed  ____________________________________________   EKG  None  ____________________________________________    RADIOLOGY  CT abd/pel Concerning for diverticulitis with possible small perforation  ____________________________________________   PROCEDURES  Procedures  ____________________________________________   INITIAL IMPRESSION / ASSESSMENT AND PLAN / ED COURSE  Pertinent labs & imaging results that were available during my care of the patient were reviewed by me and considered in my medical  decision making (see chart for details). Patient presents to the emergency department because of concerns for abdominal pain and abnormal blood work.  Patient with significant leukocytosis.  CT abdomen pelvis was performed which did show diverticulitis with possible perforation.  Patient was started on IV antibiotics.  Consulted with surgery who called to evaluate the patient.  Discussed findings and plan with patient. ________________________________________   FINAL CLINICAL IMPRESSION(S) / ED DIAGNOSES  Final diagnoses:  Abdominal pain, unspecified abdominal location  Diverticulitis  Anemia, unspecified type     Note: This dictation was prepared with Dragon dictation. Any transcriptional errors that result from this process are unintentional     Nance Pear, MD 10/05/17 1950

## 2017-10-05 NOTE — ED Notes (Signed)
Report to [place], [place]

## 2017-10-05 NOTE — Progress Notes (Signed)
Patient's Name: Denise Barry  MRN: [id number]  Referring Provider: Einar Pheasant, MD  DOB: [birth year]  PCP: Einar Pheasant, MD  DOS: 10/05/2017  Note by: Vevelyn Francois NP  Service setting: Ambulatory outpatient  Specialty: Interventional Pain Management  Location: [facility] (AMB) Pain Management Facility    Patient type: Established    Primary Reason(s) for Visit: Evaluation of chronic illnesses with exacerbation, or progression (Level of risk: moderate) CC: Abdominal Pain (bilateral very weak)  HPI  Denise Barry is a 83 y.o. year old, female patient, who comes today for a follow-up evaluation. 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); Urinary incontinence; History of hip fracture (Left); Right leg swelling; Health care maintenance; Hyponatremia; History of shoulder fracture (Left); Syncope; Chronic low back pain (Primary Area of Pain)  (Bilateral) (L>R); Chronic pain syndrome; Opiate use; Other long term (current) drug therapy; Other specified health status; Other reduced mobility; Disorder of bone, unspecified; Acquired spondylolisthesis; Closed 2-part displaced fracture of surgical neck of left humerus; DDD (degenerative disc disease), lumbar; Pyuria; Spinal stenosis, unspecified region other than cervical; SUNCT (short unilateral neuralgiform headache, conjunctival inj/tear); Chronic anemia; Old Compression fracture of L1 and L2; DDD (degenerative disc disease), thoracic; Scoliosis; Abnormal MRI, lumbar spine (07/25/2017); Chronic musculoskeletal pain; Spasm of muscle of lower back (B); Lumbar Spondylosis; Lumbar facet syndrome (Bilateral) (L>R); Chronic sacroiliac joint pain (Bilateral) (L>R); Grade 1 Anterolisthesis of L4 over L5; Grade 1 Retrolisthesis of L3 over L4; Abdominal pain; Elevated WBC count; and Granulocytosis on their problem list. [name] was last seen on 08/28/2017. Her  primarily concern today is the Abdominal Pain (bilateral very weak)  Pain Assessment: Location: Mid Abdomen Radiating: no Onset: In the past 7 days Duration: Chronic pain Quality: Constant Severity: 6 (Not able to move or functions without having pain.  )/10 (self-reported pain score)  Note: Reported level is compatible with observation. Clinically the patient looks like a 2/10 A 2/10 is viewed as "Mild to Moderate" and described as noticeable and distracting. Impossible to hide from other people. More frequent flare-ups. Still possible to adapt and function close to normal. It can be very annoying and may have occasional stronger flare-ups. With discipline, patients may get used to it and adapt. Information on the proper use of the pain scale provided to the patient today. When using our objective Pain Scale, levels between 6 and 10/10 are said to belong in an emergency room, as it progressively worsens from a 6/10, described as severely limiting, requiring emergency care not usually available at an outpatient pain management facility. At a 6/10 level, communication becomes difficult and requires great effort. Assistance to reach the emergency department may be required. Facial flushing and profuse sweating along with potentially dangerous increases in heart rate and blood pressure will be evident. Effect on ADL: just pain Timing: Constant Modifying factors: nothing  Further details on both, my assessment(s), as well as the proposed treatment plan, please see below. She states that on Saturday she started to feel different. She has been having abdominal pain, nausea and diarrhea with mucous. She admits that her stools were never liquid just soft and "different". She admits that she had a formed stool this morning. She denies any previous problem with her bowels. She is having general abdominal pain. She denies any fever and no chills. She has not been able to sleep. She states that she is using the  muscle relaxer. She states Tizandine however she was  prescribed Baclofen. She is not using the Mobic secondary to contraindication. She is using APAP ES every four hours. She states that she was just taking this. She admtis that it was not for increased back pain. She denies any urinary symptoms, burning stinging, frequency or odor. She was having dry mouth but does not feel like it is getting worse. She was encouraged to train the 30 mL of Gatorade per hour.  Laboratory Chemistry  Inflammation Markers (CRP: Acute Phase) (ESR: Chronic Phase) Lab Results  Component Value Date   CRP 1.1 08/28/2017   ESRSEDRATE 5 08/28/2017   LATICACIDVEN 1.3 02/04/2015                 Rheumatology Markers No results found for: RF, ANA, LABURIC, URICUR, LYMEIGGIGMAB, LYMEABIGMQN              Renal Function Markers Lab Results  Component Value Date   BUN 35 (H) 10/05/2017   CREATININE 0.98 10/05/2017   GFRAA >60 10/05/2017   GFRNONAA 53 (L) 10/05/2017                 Hepatic Function Markers Lab Results  Component Value Date   AST 25 10/05/2017   ALT 45 10/05/2017   ALBUMIN 2.9 (L) 10/05/2017   ALKPHOS 152 (H) 10/05/2017   LIPASE 80 (H) 02/03/2015                 Electrolytes Lab Results  Component Value Date   NA 136 10/05/2017   K 3.9 10/05/2017   CL 102 10/05/2017   CALCIUM 9.1 10/05/2017   MG 2.2 08/28/2017   PHOS 3.9 02/03/2015                 Neuropathy Markers No results found for: VITAMINB12, FOLATE, HGBA1C, HIV               Bone Pathology Markers Lab Results  Component Value Date   25OHVITD1 47 08/28/2017   25OHVITD2 <1.0 08/28/2017   25OHVITD3 47 08/28/2017                 Coagulation Parameters Lab Results  Component Value Date   INR 1.0 02/03/2015   LABPROT 13.3 02/03/2015   APTT 26.6 01/12/2014   PLT 484 (H) 10/05/2017                 Cardiovascular Markers Lab Results  Component Value Date   TROPONINI <0.03 02/03/2015   HGB 7.5 (L) 10/05/2017   HCT 23.3  (L) 10/05/2017                 CA Markers No results found for: CEA, CA125, LABCA2               Note: Lab results reviewed.  Recent Diagnostic Imaging Review  Shoulder Imaging:  Shoulder-L DG:  Results for orders placed during the hospital encounter of 02/03/15  DG Shoulder Left   Narrative * PRIOR REPORT IMPORTED FROM AN EXTERNAL SYSTEM    CLINICAL DATA:  83 year old female who fell from standing at 1600  hours. Left upper extremity pain. Initial encounter.   EXAM:  DG SHOULDER 3+VIEWS LEFT   COMPARISON:  Portable chest radiograph 01/12/2014.   FINDINGS:  Comminuted displaced and impacted proximal left humerus fracture.  Comminuted fracture through the neck of the humerus with a 2 cm  butterfly fragment. There is 1 full shaft with anterior  displacement, and over riding of 3-4 cm. The humeral head appears  to  remain normally aligned in the glenoid but may be abnormally  rotated, uncertain.   Left clavicle and scapula appear to remain intact. Visible left ribs  and lung parenchyma are stable.   IMPRESSION:  Comminuted proximal left humerus fracture with 2 cm butterfly  fragment, 1 full shaft width of anterior displacement, and  over-riding of 3-4 cm.    Electronically Signed    By: Genevie Ann M.D.    On: 02/03/2015 20:47        Lumbosacral Imaging:  Lumbar DG 2-3 views:  Results for orders placed in visit on 03/23/17  DG Lumbar Spine 2-3 Views   Narrative CLINICAL DATA:  Low back pain  EXAM: LUMBAR SPINE - 2-3 VIEW  COMPARISON:  01/12/2014  FINDINGS: Postsurgical changes are again seen and stable. Mild scoliosis concave to the right is seen. Vertebral body height is well maintained with the exception of mild superior endplate compression at L2. This appears chronic in nature. Multilevel osteophytic changes are seen. Stable anterolisthesis at L4-5 is noted. No soft tissue abnormality is seen. Left hip replacement is noted.  IMPRESSION: Postsurgical  and degenerative changes.  No acute abnormality noted.   Electronically Signed   By: Inez Catalina M.D.   On: 03/23/2017 15:06    Lumbar DG Bending views:  Results for orders placed during the hospital encounter of 09/25/17  DG Lumbar Spine Complete W/Bend   Narrative CLINICAL DATA:  Mid to lower lumbar back pain without radiculopathy  EXAM: LUMBAR SPINE - COMPLETE WITH BENDING VIEWS  COMPARISON:  03/23/2017  FINDINGS: Hypoplastic last rib pair.  5 non-rib-bearing lumbar vertebra.  Prior posterior fusion L4-L5 with BILATERAL pedicle screws and bars.  Hardware appears intact without surrounding lucency.  L4-L5 disc prosthesis appears unchanged.  Anterior height loss of L1 appears grossly unchanged since previous exam.  Multilevel disc space narrowing and endplate spur formation pronounced at L1-L2 and L2-L3.  Vacuum phenomenon at L5-S1.  No definite acute fracture or bone destruction.  Minimal anterolisthesis at L4-L5 appears stable.  Retrolisthesis is presence at L3-L4, approximately 7.4 mm at neutral, decreased at 3.4 mm with flexion and increased 8.7 mm with extension.  Scattered facet degenerative changes lumbar spine.  IMPRESSION: Multilevel degenerative disc and facet disease changes of lumbar spine as above with evidence of prior L4-L5 posterior fusion.  Retrolisthesis at L3-L4 at neutral which decreases with flexion and slightly increases with extension.  Osseous demineralization with chronic superior endplate compression fracture deformity of L1.   Electronically Signed   By: Lavonia Dana M.D.   On: 09/25/2017 17:55     Complexity Note: Imaging results reviewed. Results shared with Ms. Derringer, using [employer].                         Meds   Current Outpatient Medications:  .  Acetaminophen (TYLENOL EXTRA STRENGTH PO), Take 1-2 tablets by mouth daily., Disp: , Rfl:  .  amLODipine (NORVASC) 10 MG tablet, Take 1 tablet (10 mg total) by  mouth daily., Disp: 30 tablet, Rfl: 3 .  aspirin 81 MG tablet, Take 81 mg by mouth daily., Disp: , Rfl:  .  baclofen (LIORESAL) 10 MG tablet, Take 1 tablet (10 mg total) 3 (three) times daily by mouth., Disp: 90 tablet, Rfl: 0 .  bisacodyl (DULCOLAX) 5 MG EC tablet, Take 5 mg by mouth as needed. , Disp: , Rfl:  .  Eslicarbazepine Acetate (APTIOM PO), Take 1 tablet  by mouth 2 (two) times daily., Disp: , Rfl:  .  estradiol (ESTRACE VAGINAL) 0.1 MG/GM vaginal cream, Apply PRN, Disp: , Rfl:  .  fexofenadine (ALLEGRA) 180 MG tablet, Take 180 mg by mouth as needed for allergies or rhinitis., Disp: , Rfl:  .  Magnesium Oxide 500 MG CAPS, Take 1 capsule (500 mg total) at bedtime by mouth., Disp: 30 capsule, Rfl: 0 .  Multiple Vitamin (MULTI-VITAMINS) TABS, Take 1 tablet by mouth. , Disp: , Rfl:  .  tiZANidine (ZANAFLEX) 4 MG tablet, Take 1 tablet (4 mg total) 3 (three) times daily as needed by mouth for muscle spasms., Disp: 90 tablet, Rfl: 0 .  traMADol (ULTRAM) 50 MG tablet, Take 1 tablet (50 mg total) by mouth 2 (two) times daily as needed for moderate pain., Disp: 15 tablet, Rfl: 0  ROS  Constitutional: Denies any fever or chills Gastrointestinal: No reported hemesis, hematochezia, vomiting, or acute GI distress Musculoskeletal: Denies any acute onset joint swelling, redness, loss of ROM, or weakness Neurological: No reported episodes of acute onset apraxia, aphasia, dysarthria, agnosia, amnesia, paralysis, loss of coordination, or loss of consciousness  Allergies  Ms. Hannig is allergic to codeine; cyclobenzaprine; methocarbamol; carbamazepine; and gabapentin.  [place]  Drug: Ms. Patin  reports that she does not use drugs. Alcohol:  reports that she does not drink alcohol. Tobacco:  reports that she has quit smoking. she has never used smokeless tobacco. Medical:  has a past medical history of Allergy, Hypercholesterolemia, Hypertension, Osteoarthritis, Urinary incontinence, and Varicose  veins. Surgical: Ms. Erck  has a past surgical history that includes Back surgery (09/16/13); sclerosis (2000); Dilation and curettage of uterus (1971); Tubal ligation (1972); and Joint replacement. Family: family history includes Diabetes in her mother; Heart disease in her father and mother; Hypertension in her mother; Stroke in her father.  Constitutional Exam  General appearance: Well nourished, well developed, and well hydrated. In no apparent acute distress Vitals:   10/05/17 1041  BP: 107/64  Pulse: 92  Resp: 16  Temp: 98.1 F (36.7 C)  TempSrc: Oral  SpO2: 97%  Weight: 155 lb (70.3 kg)  Height: 5' 5.5" (1.664 m)   BMI Assessment: Estimated body mass index is 25.4 kg/m as calculated from the following:   Height as of this encounter: 5' 5.5" (1.664 m).   Weight as of this encounter: 155 lb (70.3 kg). Psych/Mental status: Alert, oriented x 3 (person, place, & time)       Eyes: PERLA Respiratory: No evidence of acute respiratory distress Abdomen: Slightly distended, hypoactive bowel sounds, generalized tenderness no rebound tenderness  Lumbar Spine Area Exam  Skin & Axial Inspection: No masses, redness, or swelling Alignment: Symmetrical Functional ROM: Unrestricted ROM      Stability: No instability detected Muscle Tone/Strength: Functionally intact. No obvious neuro-muscular anomalies detected. Sensory (Neurological): Unimpaired Palpation: No palpable anomalies       Provocative Tests: Lumbar Hyperextension and rotation test: evaluation deferred today       Lumbar Lateral bending test: evaluation deferred today       Patrick's Maneuver: evaluation deferred today                    Gait & Posture Assessment  Ambulation: Patient ambulates using a wheel chair Able to ambulate Gait: Relatively normal for age and body habitus Posture: WNL   Lower Extremity Exam    Side: Right lower extremity  Side: Left lower extremity  Skin & Extremity Inspection: Skin color,  temperature, and hair growth are WNL. No peripheral edema or cyanosis. No masses, redness, swelling, asymmetry, or associated skin lesions. No contractures.  Skin & Extremity Inspection: Skin color, temperature, and hair growth are WNL. No peripheral edema or cyanosis. No masses, redness, swelling, asymmetry, or associated skin lesions. No contractures.  Functional ROM: Unrestricted ROM          Functional ROM: Unrestricted ROM          Muscle Tone/Strength: Functionally intact. No obvious neuro-muscular anomalies detected.  Muscle Tone/Strength: Functionally intact. No obvious neuro-muscular anomalies detected.  Sensory (Neurological): Unimpaired  Sensory (Neurological): Unimpaired  Palpation: No palpable anomalies  Palpation: No palpable anomalies   Assessment  Primary Diagnosis & Pertinent Problem List: The primary encounter diagnosis was Chronic low back pain (Primary Area of Pain)  (Bilateral) (L>R). Diagnoses of Chronic pain syndrome, Generalized abdominal pain, Weakness, Chronic anemia, Monocytosis, and Granulocytosis were also pertinent to this visit.  Status Diagnosis  Controlled Controlled Controlled 1. Chronic low back pain (Primary Area of Pain)  (Bilateral) (L>R)   2. Chronic pain syndrome   3. Generalized abdominal pain   4. Weakness   5. Chronic anemia   6. Monocytosis   7. Granulocytosis     Problems updated and reviewed during this visit: Problem  Abdominal Pain  Elevated Wbc Count  Granulocytosis   Plan of Care  Pharmacotherapy (Medications Ordered): No orders of the defined types were placed in this encounter. This SmartLink is deprecated. Use AVSMEDLIST instead to display the medication list for a patient. Medications administered today: Kebra Lowrimore. Auerbach had no medications administered during this visit. Lab-work, procedure(s), and/or referral(s): Orders Placed This Encounter  Procedures  . Comp. Metabolic Panel (12)  . CBC with Differential/Platelet  .  Urinalysis (clean catch)   Imaging and/or referral(s): None  Interventional management options: Planned, scheduled, and/or pending:    Unable to provide UA. PCP was called; pt to be sent to ED for admission secondary to anemia and elevated WBC    Considering:   Diagnostic bilateral lumbar facet block  Possible bilateral lumbar facet RFA  Diagnostic bilateral sacroiliac joint block  Possible bilateral sacroiliac joint RFA  Diagnostic caudal epidural steroid injection plus diagnostic epidurogram  Possible Racz procedure  Diagnostic left sided L2-3 interlaminar lumbar epidural steroid injection    PRN Procedures:   None at this time   Provider-requested follow-up: Return for Appointment As Scheduled.  Future Appointments  Date Time Provider [place]  10/16/2017  1:00 PM Dia Crawford, LPN [facility]  54/27/0623  9:15 AM Milinda Pointer, MD [facility] None  01/15/2018  9:40 AM [facility]   Primary Care Physician: Einar Pheasant, MD Location: [facility] Note by: Vevelyn Francois NP Date: 10/05/2017; Time: 2:54 PM  Pain Score Disclaimer: We use the NRS-11 scale. This is a self-reported, subjective measurement of pain severity with only modest accuracy. It is used primarily to identify changes within a particular patient. It must be understood that outpatient pain scales are significantly less accurate that those used for research, where they can be applied under ideal controlled circumstances with minimal exposure to variables. In reality, the score is likely to be a combination of pain intensity and pain affect, where pain affect describes the degree of emotional arousal or changes in action readiness caused by the sensory experience of pain. Factors such as social and work situation, setting, emotional state, anxiety levels, expectation, and prior pain experience may influence  pain perception and show large  inter-individual differences that may also be affected by time variables.  Patient instructions provided during this appointment: Patient Instructions    ____________________________________________________________________________________________  Medication Rules  Applies to: All patients receiving prescriptions (written or electronic).  Pharmacy of record: Pharmacy where electronic prescriptions will be sent. If written prescriptions are taken to a different pharmacy, please inform the nursing staff. The pharmacy listed in the electronic medical record should be the one where you would like electronic prescriptions to be sent.  Prescription refills: Only during scheduled appointments. Applies to both, written and electronic prescriptions.  NOTE: The following applies primarily to controlled substances (Opioid* Pain Medications).   Patient's responsibilities: 1. Pain Pills: Bring all pain pills to every appointment (except for procedure appointments). 2. Pill Bottles: Bring pills in original pharmacy bottle. Always bring newest bottle. Bring bottle, even if empty. 3. Medication refills: You are responsible for knowing and keeping track of what medications you need refilled. The day before your appointment, write a list of all prescriptions that need to be refilled. Bring that list to your appointment and give it to the admitting nurse. Prescriptions will be written only during appointments. If you forget a medication, it will not be "Called in", "Faxed", or "electronically sent". You will need to get another appointment to get these prescribed. 4. Prescription Accuracy: You are responsible for carefully inspecting your prescriptions before leaving our office. Have the discharge nurse carefully go over each prescription with you, before taking them home. Make sure that your name is accurately spelled, that your address is correct. Check the name and dose of your medication to make sure it is  accurate. Check the number of pills, and the written instructions to make sure they are clear and accurate. Make sure that you are given enough medication to last until your next medication refill appointment. 5. Taking Medication: Take medication as prescribed. Never take more pills than instructed. Never take medication more frequently than prescribed. Taking less pills or less frequently is permitted and encouraged, when it comes to controlled substances (written prescriptions).  6. Inform other Doctors: Always inform, all of your healthcare providers, of all the medications you take. 7. Pain Medication from other Providers: You are not allowed to accept any additional pain medication from any other Doctor or Healthcare provider. There are two exceptions to this rule. (see below) In the event that you require additional pain medication, you are responsible for notifying us, as stated below. 8. Medication Agreement: You are responsible for carefully reading and following our Medication Agreement. This must be signed before receiving any prescriptions from our practice. Safely store a copy of your signed Agreement. Violations to the Agreement will result in no further prescriptions. (Additional copies of our Medication Agreement are available upon request.) 9. Laws, Rules, & Regulations: All patients are expected to follow all Federal and [employer], [employer], Rules, [employer]. Ignorance of the Laws does not constitute a valid excuse. The use of any illegal substances is prohibited. 10. Adopted CDC guidelines & recommendations: Target dosing levels will be at or below 60 MME/day. Use of benzodiazepines** is not recommended.  Exceptions: There are only two exceptions to the rule of not receiving pain medications from other Healthcare Providers. 1. Exception #1 (Emergencies): In the event of an emergency (i.e.: accident requiring emergency care), you are allowed to receive additional pain medication.  However, you are responsible for: As soon as you are able, call our office [phone], at any  time of the day or night, and leave a message stating your name, the date and nature of the emergency, and the name and dose of the medication prescribed. In the event that your call is answered by a member of our staff, make sure to document and save the date, time, and the name of the person that took your information.  2. Exception #2 (Planned Surgery): In the event that you are scheduled by another doctor or dentist to have any type of surgery or procedure, you are allowed (for a period no longer than 30 days), to receive additional pain medication, for the acute post-op pain. However, in this case, you are responsible for picking up a copy of our "Post-op Pain Management for Surgeons" handout, and giving it to your surgeon or dentist. This document is available at our office, and does not require an appointment to obtain it. Simply go to our office during business hours (Monday-Thursday from 8:00 AM to 4:00 PM) (Friday 8:00 AM to 12:00 Noon) or if you have a scheduled appointment with [place], prior to your surgery, and ask for it by name. In addition, you will need to provide [place] with your name, name of your surgeon, type of surgery, and date of procedure or surgery.  *Opioid medications include: morphine, codeine, oxycodone, oxymorphone, hydrocodone, hydromorphone, meperidine, tramadol, tapentadol, buprenorphine, fentanyl, methadone. **Benzodiazepine medications include: diazepam (Valium), alprazolam (Xanax), clonazepam (Klonopine), lorazepam (Ativan), clorazepate (Tranxene), chlordiazepoxide (Librium), estazolam (Prosom), oxazepam (Serax), temazepam (Restoril), triazolam (Halcion)  ____________________________________________________________________________________________  ____________________________________________________________________________________________  Pain Scale  Introduction: The pain score  used by this practice is the Verbal Numerical Rating Scale (VNRS-11). This is an 11-point scale. It is for adults and children 10 years or older. There are significant differences in how the pain score is reported, used, and applied. Forget everything you learned in the past and learn this scoring system.  General Information: The scale should reflect your current level of pain. Unless you are specifically asked for the level of your worst pain, or your average pain. If you are asked for one of these two, then it should be understood that it is over the past 24 hours.  Basic Activities of Daily Living (ADL): Personal hygiene, dressing, eating, transferring, and using restroom.  Instructions: Most patients tend to report their level of pain as a combination of two factors, their physical pain and their psychosocial pain. This last one is also known as "suffering" and it is reflection of how physical pain affects you socially and psychologically. From now on, report them separately. From this point on, when asked to report your pain level, report only your physical pain. Use the following table for reference.  Pain Clinic Pain Levels (0-5/10)  Pain Level Score  Description  No Pain 0   Mild pain 1 Nagging, annoying, but does not interfere with basic activities of daily living (ADL). Patients are able to eat, bathe, get dressed, toileting (being able to get on and off the toilet and perform personal hygiene functions), transfer (move in and out of bed or a chair without assistance), and maintain continence (able to control bladder and bowel functions). Blood pressure and heart rate are unaffected. A normal heart rate for a healthy adult ranges from 60 to 100 bpm (beats per minute).   Mild to moderate pain 2 Noticeable and distracting. Impossible to hide from other people. More frequent flare-ups. Still possible to adapt and function close to normal. It can be very annoying and may have occasional stronger  flare-ups. With discipline, patients may get used to it and adapt.   Moderate pain 3 Interferes significantly with activities of daily living (ADL). It becomes difficult to feed, bathe, get dressed, get on and off the toilet or to perform personal hygiene functions. Difficult to get in and out of bed or a chair without assistance. Very distracting. With effort, it can be ignored when deeply involved in activities.   Moderately severe pain 4 Impossible to ignore for more than a few minutes. With effort, patients may still be able to manage work or participate in some social activities. Very difficult to concentrate. Signs of autonomic nervous system discharge are evident: dilated pupils (mydriasis); mild sweating (diaphoresis); sleep interference. Heart rate becomes elevated (>115 bpm). Diastolic blood pressure (lower number) rises above 100 mmHg. Patients find relief in laying down and not moving.   Severe pain 5 Intense and extremely unpleasant. Associated with frowning face and frequent crying. Pain overwhelms the senses.  Ability to do any activity or maintain social relationships becomes significantly limited. Conversation becomes difficult. Pacing back and forth is common, as getting into a comfortable position is nearly impossible. Pain wakes you up from deep sleep. Physical signs will be obvious: pupillary dilation; increased sweating; goosebumps; brisk reflexes; cold, clammy hands and feet; nausea, vomiting or dry heaves; loss of appetite; significant sleep disturbance with inability to fall asleep or to remain asleep. When persistent, significant weight loss is observed due to the complete loss of appetite and sleep deprivation.  Blood pressure and heart rate becomes significantly elevated. Caution: If elevated blood pressure triggers a pounding headache associated with blurred vision, then the patient should immediately seek attention at an urgent or emergency care unit, as these may be signs of an  impending stroke.    Emergency Department Pain Levels (6-10/10)  Emergency Room Pain 6 Severely limiting. Requires emergency care and should not be seen or managed at an outpatient pain management facility. Communication becomes difficult and requires great effort. Assistance to reach the emergency department may be required. Facial flushing and profuse sweating along with potentially dangerous increases in heart rate and blood pressure will be evident.   Distressing pain 7 Self-care is very difficult. Assistance is required to transport, or use restroom. Assistance to reach the emergency department will be required. Tasks requiring coordination, such as bathing and getting dressed become very difficult.   Disabling pain 8 Self-care is no longer possible. At this level, pain is disabling. The individual is unable to do even the most "basic" activities such as walking, eating, bathing, dressing, transferring to a bed, or toileting. Fine motor skills are lost. It is difficult to think clearly.   Incapacitating pain 9 Pain becomes incapacitating. Thought processing is no longer possible. Difficult to remember your own name. Control of movement and coordination are lost.   The worst pain imaginable 10 At this level, most patients pass out from pain. When this level is reached, collapse of the autonomic nervous system occurs, leading to a sudden drop in blood pressure and heart rate. This in turn results in a temporary and dramatic drop in blood flow to the brain, leading to a loss of consciousness. Fainting is one of the body's self defense mechanisms. Passing out puts the brain in a calmed state and causes it to shut down for a while, in order to begin the healing process.    Summary: 1. Refer to this scale when providing [place] with your pain level. 2. Be accurate and careful  when reporting your pain level. This will help with your care. 3. Over-reporting your pain level will lead to loss of  credibility. 4. Even a level of 1/10 means that there is pain and will be treated at our facility. 5. High, inaccurate reporting will be documented as "Symptom Exaggeration", leading to loss of credibility and suspicions of possible secondary gains such as obtaining more narcotics, or wanting to appear disabled, for fraudulent reasons. 6. Only pain levels of 5 or below will be seen at our facility. 7. Pain levels of 6 and above will be sent to the Emergency Department and the appointment cancelled. ____________________________________________________________________________________________   Patient instructed to go to ED for follow up now. Agrees and will be taken by wheelchair.

## 2017-10-05 NOTE — ED Triage Notes (Addendum)
Pt sent from the pain clinic for elevated WBC 28 and Hbg 7.5. Pt states she has had loose mucousy stools for the past 2 weeks. Denies N/V.Marland Kitchen Denies having in blood in stools. Pt having intermittent abd cramping. Pt had labs drawn at pain clinic and results are in [facility]

## 2017-10-05 NOTE — ED Notes (Signed)
Floor unable to take report at this time.

## 2017-10-05 NOTE — ED Notes (Signed)
Pt has drank approx 1 bottle of contrast.

## 2017-10-05 NOTE — Progress Notes (Signed)
Not sleeping very well. Having diarrhea with mucous. Not eating well due to weakness. Started after injection.  Labs shown to [facility]. Primary Care MD Dr. Einar Pheasant notified. Plan is to send patient to Emergency department for follow up regarding labs and abdominal pain.

## 2017-10-05 NOTE — ED Notes (Signed)
Pt up to bathroom; unable to provide urine sample at this time.

## 2017-10-05 NOTE — ED Notes (Signed)
ED Provider at bedside. 

## 2017-10-05 NOTE — Patient Instructions (Addendum)
____________________________________________________________________________________________  Medication Rules  Applies to: All patients receiving prescriptions (written or electronic).  Pharmacy of record: Pharmacy where electronic prescriptions will be sent. If written prescriptions are taken to a different pharmacy, please inform the nursing staff. The pharmacy listed in the electronic medical record should be the one where you would like electronic prescriptions to be sent.  Prescription refills: Only during scheduled appointments. Applies to both, written and electronic prescriptions.  NOTE: The following applies primarily to controlled substances (Opioid* Pain Medications).   Patient's responsibilities: 1. Pain Pills: Bring all pain pills to every appointment (except for procedure appointments). 2. Pill Bottles: Bring pills in original pharmacy bottle. Always bring newest bottle. Bring bottle, even if empty. 3. Medication refills: You are responsible for knowing and keeping track of what medications you need refilled. The day before your appointment, write a list of all prescriptions that need to be refilled. Bring that list to your appointment and give it to the admitting nurse. Prescriptions will be written only during appointments. If you forget a medication, it will not be "Called in", "Faxed", or "electronically sent". You will need to get another appointment to get these prescribed. 4. Prescription Accuracy: You are responsible for carefully inspecting your prescriptions before leaving our office. Have the discharge nurse carefully go over each prescription with you, before taking them home. Make sure that your name is accurately spelled, that your address is correct. Check the name and dose of your medication to make sure it is accurate. Check the number of pills, and the written instructions to make sure they are clear and accurate. Make sure that you are given enough medication to  last until your next medication refill appointment. 5. Taking Medication: Take medication as prescribed. Never take more pills than instructed. Never take medication more frequently than prescribed. Taking less pills or less frequently is permitted and encouraged, when it comes to controlled substances (written prescriptions).  6. Inform other Doctors: Always inform, all of your healthcare providers, of all the medications you take. 7. Pain Medication from other Providers: You are not allowed to accept any additional pain medication from any other Doctor or Healthcare provider. There are two exceptions to this rule. (see below) In the event that you require additional pain medication, you are responsible for notifying us, as stated below. 8. Medication Agreement: You are responsible for carefully reading and following our Medication Agreement. This must be signed before receiving any prescriptions from our practice. Safely store a copy of your signed Agreement. Violations to the Agreement will result in no further prescriptions. (Additional copies of our Medication Agreement are available upon request.) 9. Laws, Rules, & Regulations: All patients are expected to follow all Federal and State Laws, Statutes, Rules, & Regulations. Ignorance of the Laws does not constitute a valid excuse. The use of any illegal substances is prohibited. 10. Adopted CDC guidelines & recommendations: Target dosing levels will be at or below 60 MME/day. Use of benzodiazepines** is not recommended.  Exceptions: There are only two exceptions to the rule of not receiving pain medications from other Healthcare Providers. 1. Exception #1 (Emergencies): In the event of an emergency (i.e.: accident requiring emergency care), you are allowed to receive additional pain medication. However, you are responsible for: As soon as you are able, call our office [phone], at any time of the day or night, and leave a message stating your  name, the date and nature of the emergency, and the name and dose of the medication   prescribed. In the event that your call is answered by a member of our staff, make sure to document and save the date, time, and the name of the person that took your information.  2. Exception #2 (Planned Surgery): In the event that you are scheduled by another doctor or dentist to have any type of surgery or procedure, you are allowed (for a period no longer than 30 days), to receive additional pain medication, for the acute post-op pain. However, in this case, you are responsible for picking up a copy of our "Post-op Pain Management for Surgeons" handout, and giving it to your surgeon or dentist. This document is available at our office, and does not require an appointment to obtain it. Simply go to our office during business hours (Monday-Thursday from 8:00 AM to 4:00 PM) (Friday 8:00 AM to 12:00 Noon) or if you have a scheduled appointment with us, prior to your surgery, and ask for it by name. In addition, you will need to provide us with your name, name of your surgeon, type of surgery, and date of procedure or surgery.  *Opioid medications include: morphine, codeine, oxycodone, oxymorphone, hydrocodone, hydromorphone, meperidine, tramadol, tapentadol, buprenorphine, fentanyl, methadone. **Benzodiazepine medications include: diazepam (Valium), alprazolam (Xanax), clonazepam (Klonopine), lorazepam (Ativan), clorazepate (Tranxene), chlordiazepoxide (Librium), estazolam (Prosom), oxazepam (Serax), temazepam (Restoril), triazolam (Halcion)  ____________________________________________________________________________________________  ____________________________________________________________________________________________  Pain Scale  Introduction: The pain score used by this practice is the Verbal Numerical Rating Scale (VNRS-11). This is an 11-point scale. It is for adults and children 10 years or older. There are  significant differences in how the pain score is reported, used, and applied. Forget everything you learned in the past and learn this scoring system.  General Information: The scale should reflect your current level of pain. Unless you are specifically asked for the level of your worst pain, or your average pain. If you are asked for one of these two, then it should be understood that it is over the past 24 hours.  Basic Activities of Daily Living (ADL): Personal hygiene, dressing, eating, transferring, and using restroom.  Instructions: Most patients tend to report their level of pain as a combination of two factors, their physical pain and their psychosocial pain. This last one is also known as "suffering" and it is reflection of how physical pain affects you socially and psychologically. From now on, report them separately. From this point on, when asked to report your pain level, report only your physical pain. Use the following table for reference.  Pain Clinic Pain Levels (0-5/10)  Pain Level Score  Description  No Pain 0   Mild pain 1 Nagging, annoying, but does not interfere with basic activities of daily living (ADL). Patients are able to eat, bathe, get dressed, toileting (being able to get on and off the toilet and perform personal hygiene functions), transfer (move in and out of bed or a chair without assistance), and maintain continence (able to control bladder and bowel functions). Blood pressure and heart rate are unaffected. A normal heart rate for a healthy adult ranges from 60 to 100 bpm (beats per minute).   Mild to moderate pain 2 Noticeable and distracting. Impossible to hide from other people. More frequent flare-ups. Still possible to adapt and function close to normal. It can be very annoying and may have occasional stronger flare-ups. With discipline, patients may get used to it and adapt.   Moderate pain 3 Interferes significantly with activities of daily living (ADL). It  becomes difficult   to feed, bathe, get dressed, get on and off the toilet or to perform personal hygiene functions. Difficult to get in and out of bed or a chair without assistance. Very distracting. With effort, it can be ignored when deeply involved in activities.   Moderately severe pain 4 Impossible to ignore for more than a few minutes. With effort, patients may still be able to manage work or participate in some social activities. Very difficult to concentrate. Signs of autonomic nervous system discharge are evident: dilated pupils (mydriasis); mild sweating (diaphoresis); sleep interference. Heart rate becomes elevated (>115 bpm). Diastolic blood pressure (lower number) rises above 100 mmHg. Patients find relief in laying down and not moving.   Severe pain 5 Intense and extremely unpleasant. Associated with frowning face and frequent crying. Pain overwhelms the senses.  Ability to do any activity or maintain social relationships becomes significantly limited. Conversation becomes difficult. Pacing back and forth is common, as getting into a comfortable position is nearly impossible. Pain wakes you up from deep sleep. Physical signs will be obvious: pupillary dilation; increased sweating; goosebumps; brisk reflexes; cold, clammy hands and feet; nausea, vomiting or dry heaves; loss of appetite; significant sleep disturbance with inability to fall asleep or to remain asleep. When persistent, significant weight loss is observed due to the complete loss of appetite and sleep deprivation.  Blood pressure and heart rate becomes significantly elevated. Caution: If elevated blood pressure triggers a pounding headache associated with blurred vision, then the patient should immediately seek attention at an urgent or emergency care unit, as these may be signs of an impending stroke.    Emergency Department Pain Levels (6-10/10)  Emergency Room Pain 6 Severely limiting. Requires emergency care and should not be  seen or managed at an outpatient pain management facility. Communication becomes difficult and requires great effort. Assistance to reach the emergency department may be required. Facial flushing and profuse sweating along with potentially dangerous increases in heart rate and blood pressure will be evident.   Distressing pain 7 Self-care is very difficult. Assistance is required to transport, or use restroom. Assistance to reach the emergency department will be required. Tasks requiring coordination, such as bathing and getting dressed become very difficult.   Disabling pain 8 Self-care is no longer possible. At this level, pain is disabling. The individual is unable to do even the most "basic" activities such as walking, eating, bathing, dressing, transferring to a bed, or toileting. Fine motor skills are lost. It is difficult to think clearly.   Incapacitating pain 9 Pain becomes incapacitating. Thought processing is no longer possible. Difficult to remember your own name. Control of movement and coordination are lost.   The worst pain imaginable 10 At this level, most patients pass out from pain. When this level is reached, collapse of the autonomic nervous system occurs, leading to a sudden drop in blood pressure and heart rate. This in turn results in a temporary and dramatic drop in blood flow to the brain, leading to a loss of consciousness. Fainting is one of the body's self defense mechanisms. Passing out puts the brain in a calmed state and causes it to shut down for a while, in order to begin the healing process.    Summary: 1. Refer to this scale when providing us with your pain level. 2. Be accurate and careful when reporting your pain level. This will help with your care. 3. Over-reporting your pain level will lead to loss of credibility. 4. Even a level of   1/10 means that there is pain and will be treated at our facility. 5. High, inaccurate reporting will be documented as "Symptom  Exaggeration", leading to loss of credibility and suspicions of possible secondary gains such as obtaining more narcotics, or wanting to appear disabled, for fraudulent reasons. 6. Only pain levels of 5 or below will be seen at our facility. 7. Pain levels of 6 and above will be sent to the Emergency Department and the appointment cancelled. ____________________________________________________________________________________________   Patient instructed to go to ED for follow up now. Agrees and will be taken by wheelchair.

## 2017-10-05 NOTE — ED Notes (Addendum)
Pt reports lower abdominal cramping and mucous looking diarrhea X 1 week. Pt denies any active bleeding or black tarry stools. Pt alert and oriented X4, active, cooperative, pt in NAD. RR even and unlabored, color WNL.   Abnormal labs at doctor's office.

## 2017-10-05 NOTE — H&P (Signed)
Patient ID: Denise Barry, female   DOB: Nov 02, 1933, 83 y.o.   MRN: [id number]  CC: Abdominal pain  HPI Denise Barry is a 83 y.o. female who presents emergency department with a 1 week history of abdominal pain.  Patient was having an outpatient workup and was found to have an elevated white blood cell count and anemia and was referred to the ER for further workup.  Upon presentation she again admitted to her 1 week of lower abdominal pain as well as diarrhea.  She denies any fevers, chills, chest pain, shortness of breath but she has had nausea without vomiting.  She is otherwise in her usual state of health.  She is never had any abdominal surgeries.  The pain has always been in her lower abdomen especially in the left lower quadrant.  She describes the area as being achy.  HPI  Past Medical History:  Diagnosis Date  . Allergy   . Hypercholesterolemia   . Hypertension   . Osteoarthritis   . Urinary incontinence    pessary in place  . Varicose veins    H/O    Past Surgical History:  Procedure Laterality Date  . BACK SURGERY  09/16/13  . DILATION AND CURETTAGE OF UTERUS  1971  . JOINT REPLACEMENT    . sclerosis  2000  . TUBAL LIGATION  1972    Family History  Problem Relation Age of Onset  . Heart disease Mother   . Diabetes Mother   . Hypertension Mother   . Heart disease Father   . Stroke Father   . Breast cancer Neg Hx     Social History Social History   Tobacco Use  . Smoking status: Former Research scientist (life sciences)  . Smokeless tobacco: Never Used  Substance Use Topics  . Alcohol use: No    Alcohol/week: 0.0 oz  . Drug use: No    Allergies  Allergen Reactions  . Codeine Nausea Only    GI upset  . Cyclobenzaprine     Other reaction(s): Headache  . Methocarbamol     Other reaction(s): Other (See Comments)  . Carbamazepine Anxiety  . Gabapentin Anxiety    No current facility-administered medications for this encounter.    Current Outpatient Medications   Medication Sig Dispense Refill  . amLODipine (NORVASC) 10 MG tablet Take 1 tablet (10 mg total) by mouth daily. 30 tablet 3  . aspirin 81 MG tablet Take 81 mg by mouth daily.    . Eslicarbazepine Acetate 400 MG TABS Take 1 tablet by mouth 3 (three) times daily.    . Magnesium Oxide 500 MG CAPS Take 1 capsule (500 mg total) at bedtime by mouth. 30 capsule 0  . Multiple Vitamin (MULTI-VITAMINS) TABS Take 1 tablet by mouth.     Marland Kitchen tiZANidine (ZANAFLEX) 4 MG tablet Take 1 tablet (4 mg total) 3 (three) times daily as needed by mouth for muscle spasms. 90 tablet 0  . Acetaminophen (TYLENOL EXTRA STRENGTH PO) Take 1-2 tablets by mouth daily.    . baclofen (LIORESAL) 10 MG tablet Take 1 tablet (10 mg total) 3 (three) times daily by mouth. (Patient not taking: Reported on 10/05/2017) 90 tablet 0  . bisacodyl (DULCOLAX) 5 MG EC tablet Take 5 mg by mouth as needed.     Marland Kitchen estradiol (ESTRACE VAGINAL) 0.1 MG/GM vaginal cream Apply PRN    . fexofenadine (ALLEGRA) 180 MG tablet Take 180 mg by mouth as needed for allergies or rhinitis.    Marland Kitchen traMADol (  ULTRAM) 50 MG tablet Take 1 tablet (50 mg total) by mouth 2 (two) times daily as needed for moderate pain. 15 tablet 0     Review of Systems A multi-point review of systems was asked and was negative except for the findings documented in the HPI  Physical Exam Blood pressure (!) 140/52, pulse (!) 103, temperature 97.9 F (36.6 C), temperature source Oral, resp. rate 16, height 5\' 3"  (1.6 m), weight 70.3 kg (155 lb), SpO2 96 %. CONSTITUTIONAL: No acute distress. EYES: Pupils are equal, round, and reactive to light, Sclera are non-icteric. EARS, NOSE, MOUTH AND THROAT: The oropharynx is clear. The oral mucosa is pink and moist. Hearing is intact to voice. LYMPH NODES:  Lymph nodes in the neck are normal. RESPIRATORY:  Lungs are clear. There is normal respiratory effort, with equal breath sounds bilaterally, and without pathologic use of accessory  muscles. CARDIOVASCULAR: Heart is tachycardic without murmurs, gallops, or rubs. GI: The abdomen is soft, tender to palpation in the left lower abdomen, and nondistended. There are no palpable masses. There is no hepatosplenomegaly. There are normal bowel sounds in all quadrants. GU: Rectal deferred.   MUSCULOSKELETAL: Normal muscle strength and tone. No cyanosis or edema.   SKIN: Turgor is good and there are no pathologic skin lesions or ulcers. NEUROLOGIC: Motor and sensation is grossly normal. Cranial nerves are grossly intact. PSYCH:  Oriented to person, place and time. Affect is normal.  Data Reviewed Images and labs reviewed which show a leukocytosis of 29.8, and anemia of 7.5 or 23.3, thrombocytosis of 484.  Mildly elevated alkaline phosphatase 152, BUN 35, albumin of 2.9.  The remainder of her electrolytes are within normal limits.  CT scan of the abdomen shows that her colon is full of stool, there is a large pessary in the pelvis that appears to be causing compression to the distal colon.  As diverticulitis with a contained posterior perforation of the lower sigmoid colon. I have personally reviewed the patient's imaging, laboratory findings and medical records.    Assessment    Perforated diverticulitis    Plan    83 year old female with a perforated diverticulitis with a posterior perforation.  Discussed the diagnosis in detail with the patient and her family members in the room.  Discussed the likelihood of improvement with IV antibiotics and bowel rest.  Also discussed the possible risk of needing urgent surgical intervention.  Given that the pessary appears to be causing external compression if she continues to have issues with constipation then removal or downsizing of the pessary might need to be considered.  For now the plan is for admission, IV antibiotics, bowel rest.  Patient agrees with this plan.     Time spent with the patient was 50 minutes, with more than 50% of the  time spent in face-to-face education, counseling and care coordination.     Clayburn Pert, MD FACS General Surgeon 10/05/2017, 7:44 PM

## 2017-10-06 DIAGNOSIS — K5792 Diverticulitis of intestine, part unspecified, without perforation or abscess without bleeding: Secondary | ICD-10-CM

## 2017-10-06 LAB — PHOSPHORUS: Phosphorus: 4.1 mg/dL (ref 2.5–4.6)

## 2017-10-06 LAB — BASIC METABOLIC PANEL
Anion gap: 9 (ref 5–15)
BUN: 28 mg/dL — AB (ref 6–20)
CALCIUM: 8.1 mg/dL — AB (ref 8.9–10.3)
CHLORIDE: 102 mmol/L (ref 101–111)
CO2: 22 mmol/L (ref 22–32)
CREATININE: 0.89 mg/dL (ref 0.44–1.00)
GFR calc Af Amer: >60 mL/min (ref >60)
GFR calc non Af Amer: 59 mL/min — ABNORMAL LOW (ref >60)
GLUCOSE: 137 mg/dL — AB (ref 65–99)
Potassium: 3.9 mmol/L (ref 3.5–5.1)
Sodium: 133 mmol/L — ABNORMAL LOW (ref 135–145)

## 2017-10-06 LAB — PREPARE RBC (CROSSMATCH)

## 2017-10-06 LAB — IRON AND TIBC
IRON: 6 ug/dL — AB (ref 28–170)
SATURATION RATIOS: 3 % — AB (ref 10.4–31.8)
TIBC: 218 ug/dL — ABNORMAL LOW (ref 250–450)
UIBC: 212 ug/dL

## 2017-10-06 LAB — CBC
HEMATOCRIT: 18.7 % — AB (ref 35.0–47.0)
HEMOGLOBIN: 5.8 g/dL — AB (ref 12.0–16.0)
MCH: 25.2 pg — AB (ref 26.0–34.0)
MCHC: 31.3 g/dL — AB (ref 32.0–36.0)
MCV: 80.5 fL (ref 80.0–100.0)
Platelets: 424 10*3/uL (ref 150–440)
RBC: 2.32 MIL/uL — ABNORMAL LOW (ref 3.80–5.20)
RDW: 17.8 % — AB (ref 11.5–14.5)
WBC: 23 10*3/uL — ABNORMAL HIGH (ref 3.6–11.0)

## 2017-10-06 LAB — HEMOGLOBIN AND HEMATOCRIT, BLOOD
HEMATOCRIT: 20.9 % — AB (ref 35.0–47.0)
HEMATOCRIT: 21.9 % — AB (ref 35.0–47.0)
Hemoglobin: 6.5 g/dL — ABNORMAL LOW (ref 12.0–16.0)
Hemoglobin: 7.1 g/dL — ABNORMAL LOW (ref 12.0–16.0)

## 2017-10-06 LAB — ABO/RH: ABO/RH(D): O POS

## 2017-10-06 LAB — MAGNESIUM: Magnesium: 2.2 mg/dL (ref 1.7–2.4)

## 2017-10-06 MED ORDER — ACETAMINOPHEN 500 MG PO TABS
500.0000 mg | ORAL_TABLET | Freq: Four times a day (QID) | ORAL | Status: DC | PRN
  Administered 2017-10-06 – 2017-10-10 (×15): 1000 mg via ORAL
  Administered 2017-10-10 – 2017-10-11 (×2): 500 mg via ORAL
  Administered 2017-10-11: 1000 mg via ORAL
  Filled 2017-10-06 (×6): qty 2
  Filled 2017-10-06 (×2): qty 1
  Filled 2017-10-06 (×4): qty 2
  Filled 2017-10-06: qty 1
  Filled 2017-10-06 (×6): qty 2

## 2017-10-06 MED ORDER — SODIUM CHLORIDE 0.9 % IV SOLN
INTRAVENOUS | Status: DC | PRN
  Administered 2017-10-06: 09:00:00 via INTRAVENOUS

## 2017-10-06 MED ORDER — SODIUM CHLORIDE 0.9 % IV SOLN
Freq: Once | INTRAVENOUS | Status: AC
  Administered 2017-10-06: 16:00:00 via INTRAVENOUS

## 2017-10-06 MED ORDER — AMLODIPINE BESYLATE 5 MG PO TABS
5.0000 mg | ORAL_TABLET | Freq: Every day | ORAL | Status: DC
  Administered 2017-10-07 – 2017-10-08 (×2): 5 mg via ORAL
  Filled 2017-10-06 (×2): qty 1

## 2017-10-06 MED ORDER — TIZANIDINE HCL 2 MG PO TABS
4.0000 mg | ORAL_TABLET | ORAL | Status: DC | PRN
Start: 2017-10-06 — End: 2017-10-11
  Administered 2017-10-06 – 2017-10-11 (×15): 4 mg via ORAL
  Filled 2017-10-06: qty 1
  Filled 2017-10-06 (×10): qty 2
  Filled 2017-10-06 (×2): qty 1
  Filled 2017-10-06 (×3): qty 2

## 2017-10-06 NOTE — Progress Notes (Signed)
CC: Diverticulitis Subjective: Patient admitted last night with diverticulitis.  She reports that she is feeling much better this morning.  She is hungry and her abdominal pain is gone.  She has had a normal bowel movement since admission.  Objective: Vital signs in last 24 hours: Temp:  [97.5 F (36.4 C)-98.3 F (36.8 C)] 97.5 F (36.4 C) (12/07 0442) Pulse Rate:  [74-114] 92 (12/07 0858) Resp:  [16-20] 18 (12/07 0442) BP: (94-148)/(49-76) 127/67 (12/07 0858) SpO2:  [91 %-97 %] 95 % (12/07 0442) Weight:  [70.3 kg (155 lb)] 70.3 kg (155 lb) (12/06 1431) Last BM Date: 10/06/17  Intake/Output from previous day: 12/06 0701 - 12/07 0700 In: 849.6 [P.O.:60; I.V.:739.6; IV Piggyback:50] Out: 250 [Urine:250] Intake/Output this shift: Total I/O In: 372.9 [I.V.:372.9] Out: -   Physical exam:  General: No acute distress Chest: Clear to auscultation Heart: Regular rate and rhythm Abdomen: Soft, nontender, nondistended  Lab Results: CBC  Recent Labs    10/05/17 1144 10/06/17 0444 10/06/17 0845  WBC 29.8* 23.0*  --   HGB 7.5* 5.8* 6.5*  HCT 23.3* 18.7* 20.9*  PLT 484* 424  --    BMET Recent Labs    10/05/17 1144 10/06/17 0444  NA 136 133*  K 3.9 3.9  CL 102 102  CO2 23 22  GLUCOSE 95 137*  BUN 35* 28*  CREATININE 0.98 0.89  CALCIUM 9.1 8.1*   PT/INR No results for input(s): LABPROT, INR in the last 72 hours. ABG No results for input(s): PHART, HCO3 in the last 72 hours.  Invalid input(s): PCO2, PO2  Studies/Results: Ct Abdomen Pelvis W Contrast  Result Date: 10/05/2017 CLINICAL DATA:  Lower abdominal pain with diarrhea EXAM: CT ABDOMEN AND PELVIS WITH CONTRAST TECHNIQUE: Multidetector CT imaging of the abdomen and pelvis was performed using the standard protocol following bolus administration of intravenous contrast. CONTRAST:  129mL ISOVUE-300 IOPAMIDOL (ISOVUE-300) INJECTION 61% COMPARISON:  02/03/2015 FINDINGS: Lower chest: Lung bases demonstrate some  minimal scarring bilaterally. A hiatal hernia is noted as well. These changes are stable from the prior exam. Hepatobiliary: No focal liver abnormality is seen. No gallstones, gallbladder wall thickening, or biliary dilatation. Pancreas: Unremarkable. No pancreatic ductal dilatation or surrounding inflammatory changes. Spleen: Normal in size without focal abnormality. Adrenals/Urinary Tract: Stable left adrenal lesion is noted measuring approximately 1.4 cm consistent with an adenoma. The right adrenal is unremarkable. Renal cystic changes noted on the right. No renal calculi or obstructive changes are seen. The bladder is partially distended. Stomach/Bowel: Fecal material is noted throughout the colon. The ascending, transverse and descending colon appear within normal limits. The sigmoid colon demonstrates a significant amount of inflammatory change with surrounding adenopathy increased from the prior exam. Scattered diverticular noted in this likely represents a component of rectosigmoid diverticulitis. Adjacent to the inflamed sigmoid there is an ovoid collection of air which measures approximately 2.7 cm in greatest dimension. This was not present on the prior exam and may represent an area of focal perforation. No focal fluid collection is identified to correspond with this abnormality. The appendix is not visualized. Vascular/Lymphatic: Aortic atherosclerosis. Mesenteric lymph nodes are identified adjacent to the inflamed sigmoid colon as previously described. The largest of these measures 10 mm in short axis. No other significant lymphadenopathy is noted. Reproductive: A large pessary is again identified in place. The uterus is shrunken consistent with the patient's given age. No adnexal mass is seen. Other: No abdominal wall hernia or abnormality. No abdominopelvic ascites. Musculoskeletal: Postsurgical changes of  the lumbar spine and left hip are seen. No acute bony abnormality is noted. IMPRESSION:  Diverticulosis of the colon with evidence of significant wall thickening and pericolonic inflammatory change in the region of the sigmoid colon extending into the rectum consistent with diverticulitis. A small focus of air is noted adjacent to the sigmoid consistent with a small perforation. No associated fluid component is noted. No significant free air is identified. Some associated reactive lymph nodes are noted in the mesentery. Pessary in place. Stable left adrenal adenoma. Chronic changes as described. Electronically Signed   By: Inez Catalina M.D.   On: 10/05/2017 18:45    Anti-infectives: Anti-infectives (From admission, onward)   Start     Dose/Rate Route Frequency Ordered Stop   10/06/17 0100  piperacillin-tazobactam (ZOSYN) IVPB 3.375 g     3.375 g 12.5 mL/hr over 240 Minutes Intravenous Every 8 hours 10/05/17 2135     10/05/17 1915  piperacillin-tazobactam (ZOSYN) IVPB 3.375 g     3.375 g 100 mL/hr over 30 Minutes Intravenous  Once 10/05/17 1906 10/05/17 1943   10/05/17 1900  metroNIDAZOLE (FLAGYL) IVPB 500 mg  Status:  Discontinued     500 mg 100 mL/hr over 60 Minutes Intravenous  Once 10/05/17 1857 10/05/17 1906   10/05/17 1900  ciprofloxacin (CIPRO) IVPB 400 mg  Status:  Discontinued     400 mg 200 mL/hr over 60 Minutes Intravenous  Once 10/05/17 1857 10/05/17 1906      Assessment/Plan:  83 year old female admitted with diverticulitis with a microperforation that was contained overnight.  Her symptoms have completely resolved after having a bowel movement in a couple doses of antibiotics.  However, her blood counts have dropped unexpectedly.  Given her age she may require a blood transfusion.  However due to the unexpected drop in on clear etiology of her anemia I will consult internal medicine for assistance with a workup in the management of her chronic problems.  Encourage ambulation and incentive spirometer with the patient.  Denise Barry T. Adonis Huguenin, MD, [facility] General  Surgeon [facility]  Day [facility] [phone] Night [facility] [phone] 10/06/2017

## 2017-10-06 NOTE — Care Management (Signed)
Patient admitted with perforated diverticulitis.  Plan to treat with IV antibiotics and bowel rest. Patient lives at home with her son.  PCP SCOTT, CHARLENE .   Pharmacy [facility].  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  RNCM following.

## 2017-10-06 NOTE — Consult Note (Signed)
GI Inpatient Consult Note  Reason for Consult: Anemia, acute diverticulitis with small perforation.   Attending Requesting Consult: Dr. Anselm Jungling  History of Present Illness: Denise Barry is a 83 y.o. female with a known history of hypertension, hyperlipidemia, osteoarthritis, spinal stenosis, and degenerative disc disease admitted on 10/05/17 with acute diverticulitis and a small perforation. Denise Barry presented to the her Pain Management Physician, Dr. Consuela Mimes, with complaints of lower abdominal pain.  Symptoms began 24 hours after undergoing a diagnostic medial branch facet block at the L2-S1 levels. She denied associated symptoms like fevers, chills, nausea, and vomiting.  She experienced acute diarrhea along with abdominal pain, using Imodium prn. She endorses experiencing intermittent diarrhea since June 2018.  At baseline, she has a formed BM daily.  No hematochezia or melena. Also no NSAID use, EtOH use, or tobacco use. She takes acetaminophen and tinazidine as needed for back pain and spasms.  In the ED, labs demonstrated WBC 29.8 with neutrophils 27.8, Hgb 7.5, Hct 23.3, MCV 79.4, platelets 484.  CMP demonstrated BUN 35, albumin 2.9, GFR 35 alkaline phosphatase 152. CT A/P w/ contrast demonstrated significant wall thickening of the recto-sigmoid colon with pericolonic inflammatory changes consistent with diverticulitis; a small focus of air was noted adjacent to the sigmoid colon, consistent with a small perforation without free air.  Denise Barry was admitted to the General Surgery service and evaluated by Dr. Adonis Huguenin.  Recommendations included conservatives management with IV Zosyn and bowel rest.    This morning, Denise Barry's Hgb decreased to 5.8 with Hct 18.7. WBCs mildly improved to 23. Internal medicine consultation was obtained, with iron 6, TIBC 218, and % sat 3. Denise Barry will receive 1 unit PRBCs, and GI consultation was requested. Notably, further review of labs in  EMR indicate patient's Hgb has decreased from 12.6 to 7.5 g/dL over the last year. The etiology of this decrease is unclear.  At this time, Denise Barry reports 3/10 bilateral lower abdominal pain.  Her last BM about 1 hour ago was soft and formed without hematochezia or melena. She is tolerating a clear liquid diet without difficulty. She denies a history of prior diverticulitis. No known family history of colon cancer or colon polyps. Her last colonoscopy in 2008 was unremarkable (obtaining full records from EMR). She denies additional GI concerns at this time.   Past Medical History:  Past Medical History:  Diagnosis Date  . Allergy   . Hypercholesterolemia   . Hypertension   . Osteoarthritis   . Urinary incontinence    pessary in place  . Varicose veins    H/O    Problem List: Patient Active Problem List   Diagnosis Date Noted  . Diverticulitis   . Abdominal pain 10/05/2017  . Elevated WBC count 10/05/2017  . Granulocytosis 10/05/2017  . Diverticulitis of large intestine with perforation 10/05/2017  . Chronic musculoskeletal pain 09/13/2017  . Spasm of muscle of lower back (B) 09/13/2017  . Lumbar Spondylosis 09/13/2017  . Lumbar facet syndrome (Bilateral) (L>R) 09/13/2017  . Chronic sacroiliac joint pain (Bilateral) (L>R) 09/13/2017  . Grade 1 Anterolisthesis of L4 over L5 09/13/2017  . Grade 1 Retrolisthesis of L3 over L4 09/13/2017  . Old Compression fracture of L1 and L2 09/12/2017  . DDD (degenerative disc disease), thoracic 09/12/2017  . Scoliosis 09/12/2017  . Abnormal MRI, lumbar spine (07/25/2017) 09/12/2017  . Chronic low back pain (Primary Area of Pain)  (Bilateral) (L>R) 08/28/2017  . Chronic pain syndrome 08/28/2017  . Opiate  use 08/28/2017  . Other long term (current) drug therapy 08/28/2017  . Other specified health status 08/28/2017  . Other reduced mobility 08/28/2017  . Disorder of bone, unspecified 08/28/2017  . Syncope 07/08/2017  . Pyuria  06/10/2017  . Chronic anemia 06/10/2017  . SUNCT (short unilateral neuralgiform headache, conjunctival inj/tear) 07/09/2015  . History of shoulder fracture (Left) 06/03/2015  . Hyponatremia 05/07/2015  . Closed 2-part displaced fracture of surgical neck of left humerus 02/09/2015  . Health care maintenance 01/18/2015  . Right leg swelling 09/21/2014  . History of hip fracture (Left) 07/13/2014  . Chronic Trigeminal neuralgia (Right) 01/07/2014  . Essential hypertension, benign 01/07/2014  . Hypercholesterolemia 01/07/2014  . Failed back surgical syndrome (L4-5 fusion) 01/07/2014  . Urinary incontinence 01/07/2014  . Acquired spondylolisthesis 09/13/2013  . Spinal stenosis, unspecified region other than cervical 09/13/2013  . DDD (degenerative disc disease), lumbar 06/05/2013    Past Surgical History: Past Surgical History:  Procedure Laterality Date  . BACK SURGERY  09/16/13  . DILATION AND CURETTAGE OF UTERUS  1971  . JOINT REPLACEMENT    . sclerosis  2000  . TUBAL LIGATION  1972    Allergies: Allergies  Allergen Reactions  . Codeine Nausea Only    GI upset  . Cyclobenzaprine     Other reaction(s): Headache  . Methocarbamol     Other reaction(s): Other (See Comments)  . Carbamazepine Anxiety  . Gabapentin Anxiety    Home Medications: Medications Prior to Admission  Medication Sig Dispense Refill Last Dose  . amLODipine (NORVASC) 10 MG tablet Take 1 tablet (10 mg total) by mouth daily. 30 tablet 3 10/05/2017 at Unknown time  . aspirin 81 MG tablet Take 81 mg by mouth daily.   10/05/2017 at Unknown time  . Eslicarbazepine Acetate 400 MG TABS Take 1 tablet by mouth 3 (three) times daily.   10/05/2017 at Unknown time  . Magnesium Oxide 500 MG CAPS Take 1 capsule (500 mg total) at bedtime by mouth. 30 capsule 0 Past Week at Unknown time  . Multiple Vitamin (MULTI-VITAMINS) TABS Take 1 tablet by mouth.    10/05/2017 at Unknown time  . tiZANidine (ZANAFLEX) 4 MG tablet Take 1  tablet (4 mg total) 3 (three) times daily as needed by mouth for muscle spasms. 90 tablet 0 10/05/2017 at Unknown time  . Acetaminophen (TYLENOL EXTRA STRENGTH PO) Take 1-2 tablets by mouth daily.   prn at prn  . baclofen (LIORESAL) 10 MG tablet Take 1 tablet (10 mg total) 3 (three) times daily by mouth. (Patient not taking: Reported on 10/05/2017) 90 tablet 0 Not Taking at Unknown time  . bisacodyl (DULCOLAX) 5 MG EC tablet Take 5 mg by mouth as needed.    prn at prn  . estradiol (ESTRACE VAGINAL) 0.1 MG/GM vaginal cream Apply PRN   prn at prn  . fexofenadine (ALLEGRA) 180 MG tablet Take 180 mg by mouth as needed for allergies or rhinitis.   prn at prn  . traMADol (ULTRAM) 50 MG tablet Take 1 tablet (50 mg total) by mouth 2 (two) times daily as needed for moderate pain. 15 tablet 0 prn at prn   Home medication reconciliation was completed with the patient.   Scheduled Inpatient Medications:   . [START ON 10/07/2017] amLODipine  5 mg Oral Daily  . Eslicarbazepine Acetate  1 tablet Oral TID  . pantoprazole (PROTONIX) IV  40 mg Intravenous QHS    Continuous Inpatient Infusions:   . sodium chloride 10  mL/hr at 10/06/17 0902  . sodium chloride    . dextrose 5% lactated ringers 50 mL/hr at 10/06/17 1337  . piperacillin-tazobactam (ZOSYN)  IV Stopped (10/06/17 1402)    PRN Inpatient Medications:  sodium chloride, acetaminophen, diphenhydrAMINE **OR** diphenhydrAMINE, hydrALAZINE, morphine injection, ondansetron **OR** ondansetron (ZOFRAN) IV, tiZANidine  Family History: family history includes Diabetes in her mother; Heart disease in her father and mother; Hypertension in her mother; Stroke in her father.   Social History:   reports that she has quit smoking. she has never used smokeless tobacco. She reports that she does not drink alcohol or use drugs.   Review of Systems: Constitutional: Weight is stable.  Eyes: No changes in vision. ENT: No oral lesions, sore throat.  GI: see HPI.   Heme/Lymph: No easy bruising.  CV: No chest pain.  GU: No hematuria.  Integumentary: No rashes.  Neuro: No headaches.  Psych: No depression/anxiety.  Endocrine: No heat/cold intolerance.  Allergic/Immunologic: No urticaria.  Resp: No cough, SOB.  Musculoskeletal: No joint swelling.    Physical Examination: BP (!) 96/44 (BP Location: Left Arm)   Pulse 76   Temp (!) 97.5 F (36.4 C) (Oral)   Resp 18   Ht 5\' 3"  (1.6 m)   Wt 70.3 kg (155 lb)   SpO2 91%   BMI 27.46 kg/m  Gen: NAD, alert and oriented x 4 HEENT: PEERLA, EOMI, Neck: supple, no JVD or thyromegaly Chest: CTA bilaterally, no wheezes, crackles, or other adventitious sounds CV: RRR, no m/g/c/r Abd: soft, mild lower abdominal tenderness to moderate palpation, ND, +BS in all four quadrants; no HSM, guarding, ridigity, or rebound tenderness Ext: no edema, well perfused with 2+ pulses, Skin: no rash or lesions noted Lymph: no LAD  Data: Lab Results  Component Value Date   WBC 23.0 (H) 10/06/2017   HGB 6.5 (L) 10/06/2017   HCT 20.9 (L) 10/06/2017   MCV 80.5 10/06/2017   PLT 424 10/06/2017   Recent Labs  Lab 10/05/17 1144 10/06/17 0444 10/06/17 0845  HGB 7.5* 5.8* 6.5*   Lab Results  Component Value Date   NA 133 (L) 10/06/2017   K 3.9 10/06/2017   CL 102 10/06/2017   CO2 22 10/06/2017   BUN 28 (H) 10/06/2017   CREATININE 0.89 10/06/2017   GLU 77 08/08/2014   Lab Results  Component Value Date   ALT 45 10/05/2017   AST 25 10/05/2017   ALKPHOS 152 (H) 10/05/2017   BILITOT 0.5 10/05/2017   No results for input(s): APTT, INR, PTT in the last 168 hours.   Assessment/Plan: Denise Barry is a 83 y.o. female hypertension, hyperlipidemia, osteoarthritis, spinal stenosis, and degenerative disc disease admitted on 10/05/17 with acute diverticulitis and a small perforation on CT A/P.  Incidentally, Hgb decreased from 7.5 to 5.7 since admission; however, patient's Hgb has gradually decreased from 12 > 10 > 7 over  the last 2 years. She is symptomatically improving with IV antibiotics, bowel rest with recent initiation or clear liquids, and pain management as needed.  At this time, there is no evidence of acute GI bleeding; iron studies demonstrate low serum iron, TIBC, and % sat, which suggests a slow, chronic GI bleed in light of the gradual Hgb drop. Given patient's severe diverticulitis with small perforation, procedures are not advised at this time due to risk of perforation.  At discharge, patient will need outpatient follow-up of diverticulitis, as well as evaluation of iron deficiency anemia .   Recommendations: - Endoscopic procedures  not recommended at this time. - Continue to monitor Hgb - transfuse if < 7. - Continue clear liquid diet, progressing as tolerated. - If Hgb improves satisfactorily with 1 unit PRBCs, recommend continued monitoring of H&H serially.  Please re-consult GI if patient does not maintain Hgb. - Recommend outpatient GI follow-up to ensure clinical resolution of diverticulitis and evaluate iron deficiency anemia.   Thank you for the consult. Please call with questions or concerns.  Lavera Guise, PA-C [facility] Phone: [phone] Pager: [phone]

## 2017-10-06 NOTE — Consult Note (Signed)
[facility] NAME: Denise Barry    MR#:  [id number]  DATE OF BIRTH:  [birth year]  DATE OF ADMISSION:  10/05/2017  PRIMARY CARE PHYSICIAN: Einar Pheasant, MD   REQUESTING/REFERRING PHYSICIAN: Adonis Huguenin  CHIEF COMPLAINT:   Chief Complaint  Patient presents with  . Abdominal Pain    HISTORY OF PRESENT ILLNESS: Denise Barry  is a 83 y.o. female with a known history of Hypercholesterolemia, Htn, Osteoarthritis, Urinary incontinence- for last 4-5 days have 3-4 episodes daily of loose and dark brown stool, abdominal pian. Came with that- noted to have diverticulitis, with some localized perforation and collection, but no free air. Admitted to surgical services, on conservative management with Abx. Medical consult for low Hb. Pt denies any past hx of Bleed. Denies any vomiting. Denies Colonoscopy done ever.  her pain is much improved today and asking to eat something.  PAST MEDICAL HISTORY:   Past Medical History:  Diagnosis Date  . Allergy   . Hypercholesterolemia   . Hypertension   . Osteoarthritis   . Urinary incontinence    pessary in place  . Varicose veins    H/O    PAST SURGICAL HISTORY:  Past Surgical History:  Procedure Laterality Date  . BACK SURGERY  09/16/13  . DILATION AND CURETTAGE OF UTERUS  1971  . JOINT REPLACEMENT    . sclerosis  2000  . TUBAL LIGATION  1972    SOCIAL HISTORY:  Social History   Tobacco Use  . Smoking status: Former Research scientist (life sciences)  . Smokeless tobacco: Never Used  Substance Use Topics  . Alcohol use: No    Alcohol/week: 0.0 oz    FAMILY HISTORY:  Family History  Problem Relation Age of Onset  . Heart disease Mother   . Diabetes Mother   . Hypertension Mother   . Heart disease Father   . Stroke Father   . Breast cancer Neg Hx     DRUG ALLERGIES:  Allergies  Allergen Reactions  . Codeine Nausea Only    GI upset  . Cyclobenzaprine     Other reaction(s): Headache  . Methocarbamol      Other reaction(s): Other (See Comments)  . Carbamazepine Anxiety  . Gabapentin Anxiety    REVIEW OF SYSTEMS:   CONSTITUTIONAL: No fever,positive for fatigue or weakness.  EYES: No blurred or double vision.  EARS, NOSE, AND THROAT: No tinnitus or ear pain.  RESPIRATORY: No cough, shortness of breath, wheezing or hemoptysis.  CARDIOVASCULAR: No chest pain, orthopnea, edema.  GASTROINTESTINAL: No nausea, vomiting, diarrhea , have abdominal pain.  GENITOURINARY: No dysuria, hematuria.  ENDOCRINE: No polyuria, nocturia,  HEMATOLOGY: No anemia, easy bruising or bleeding SKIN: No rash or lesion. MUSCULOSKELETAL: No joint pain or arthritis.   NEUROLOGIC: No tingling, numbness, weakness.  PSYCHIATRY: No anxiety or depression.   MEDICATIONS AT HOME:  Prior to Admission medications   Medication Sig Start Date End Date Taking? Authorizing Provider  amLODipine (NORVASC) 10 MG tablet Take 1 tablet (10 mg total) by mouth daily. 07/05/17  Yes Einar Pheasant, MD  aspirin 81 MG tablet Take 81 mg by mouth daily.   Yes [provider]  Eslicarbazepine Acetate 400 MG TABS Take 1 tablet by mouth 3 (three) times daily.   Yes [provider]  Magnesium Oxide 500 MG CAPS Take 1 capsule (500 mg total) at bedtime by mouth. 09/13/17 10/13/17 Yes Milinda Pointer, MD  Multiple Vitamin (MULTI-VITAMINS) TABS Take 1 tablet by mouth.  Yes [provider]  tiZANidine (ZANAFLEX) 4 MG tablet Take 1 tablet (4 mg total) 3 (three) times daily as needed by mouth for muscle spasms. 09/08/17  Yes Einar Pheasant, MD  Acetaminophen (TYLENOL EXTRA STRENGTH PO) Take 1-2 tablets by mouth daily.    [provider]  baclofen (LIORESAL) 10 MG tablet Take 1 tablet (10 mg total) 3 (three) times daily by mouth. Patient not taking: Reported on 10/05/2017 09/13/17 10/13/17  Milinda Pointer, MD  bisacodyl (DULCOLAX) 5 MG EC tablet Take 5 mg by mouth as needed.     [provider]   estradiol (ESTRACE VAGINAL) 0.1 MG/GM vaginal cream Apply PRN 05/22/16   [provider]  fexofenadine (ALLEGRA) 180 MG tablet Take 180 mg by mouth as needed for allergies or rhinitis.    [provider]  traMADol (ULTRAM) 50 MG tablet Take 1 tablet (50 mg total) by mouth 2 (two) times daily as needed for moderate pain. 09/29/17   Milinda Pointer, MD      PHYSICAL EXAMINATION:   VITAL SIGNS: Blood pressure 127/67, pulse 92, temperature (!) 97.5 F (36.4 C), temperature source Oral, resp. rate 18, height 5\' 3"  (1.6 m), weight 70.3 kg (155 lb), SpO2 95 %.  GENERAL:  83 y.o.-year-old patient lying in the bed with no acute distress.  EYES: Pupils equal, round, reactive to light and accommodation. No scleral icterus. Extraocular muscles intact.  HEENT: Head atraumatic, normocephalic. Oropharynx and nasopharynx clear.  NECK:  Supple, no jugular venous distention. No thyroid enlargement, no tenderness.  LUNGS: Normal breath sounds bilaterally, no wheezing, rales,rhonchi or crepitation. No use of accessory muscles of respiration.  CARDIOVASCULAR: S1, S2 normal. No murmurs, rubs, or gallops.  ABDOMEN: Soft, mild left lower tender, nondistended. Bowel sounds present. No organomegaly or mass.  EXTREMITIES: No pedal edema, cyanosis, or clubbing.  NEUROLOGIC: Cranial nerves II through XII are intact. Muscle strength 5/5 in all extremities. Sensation intact. Gait not checked.  PSYCHIATRIC: The patient is alert and oriented x 3.  SKIN: No obvious rash, lesion, or ulcer.   LABORATORY PANEL:   CBC Recent Labs  Lab 10/05/17 1144 10/06/17 0444 10/06/17 0845  WBC 29.8* 23.0*  --   HGB 7.5* 5.8* 6.5*  HCT 23.3* 18.7* 20.9*  PLT 484* 424  --   MCV 79.4* 80.5  --   MCH 25.5* 25.2*  --   MCHC 32.1 31.3*  --   RDW 18.1* 17.8*  --   LYMPHSABS 0.6*  --   --   MONOABS 1.4*  --   --   EOSABS 0.0  --   --   BASOSABS 0.0  --   --     ------------------------------------------------------------------------------------------------------------------  Chemistries  Recent Labs  Lab 10/05/17 1144 10/06/17 0444  NA 136 133*  K 3.9 3.9  CL 102 102  CO2 23 22  GLUCOSE 95 137*  BUN 35* 28*  CREATININE 0.98 0.89  CALCIUM 9.1 8.1*  MG  --  2.2  AST 25  --   ALT 45  --   ALKPHOS 152*  --   BILITOT 0.5  --    ------------------------------------------------------------------------------------------------------------------ estimated creatinine clearance is 46.6 mL/min (by C-G formula based on SCr of 0.89 mg/dL). ------------------------------------------------------------------------------------------------------------------ No results for input(s): TSH, T4TOTAL, T3FREE, THYROIDAB in the last 72 hours.  Invalid input(s): FREET3   Coagulation profile No results for input(s): INR, PROTIME in the last 168 hours. ------------------------------------------------------------------------------------------------------------------- No results for input(s): DDIMER in the last 72 hours. -------------------------------------------------------------------------------------------------------------------  Cardiac  Enzymes No results for input(s): CKMB, TROPONINI, MYOGLOBIN in the last 168 hours.  Invalid input(s): CK ------------------------------------------------------------------------------------------------------------------ Invalid input(s): POCBNP  ---------------------------------------------------------------------------------------------------------------  Urinalysis    Component Value Date/Time   COLORURINE Yellow 08/09/2017 0940   APPEARANCEUR Cloudy (A) 08/09/2017 0940   APPEARANCEUR Hazy 02/05/2015 0830   LABSPEC 1.025 08/09/2017 0940   LABSPEC 1.020 02/05/2015 0830   PHURINE 6.0 08/09/2017 0940   GLUCOSEU Negative 08/09/2017 0940   HGBUR Moderate (A) 08/09/2017 0940   BILIRUBINUR Negative 08/09/2017  0940   BILIRUBINUR neg 07/25/2017 1128   BILIRUBINUR Negative 02/05/2015 0830   KETONESUR Negative 08/09/2017 0940   PROTEINUR 30 07/25/2017 1128   PROTEINUR Negative 02/05/2015 0830   UROBILINOGEN 1.0 mg/dL (A) 08/09/2017 0940   NITRITE Positive (A) 08/09/2017 0940   LEUKOCYTESUR Moderate (A) 08/09/2017 0940   LEUKOCYTESUR 2+ 02/05/2015 0830     RADIOLOGY: Ct Abdomen Pelvis W Contrast  Result Date: 10/05/2017 CLINICAL DATA:  Lower abdominal pain with diarrhea EXAM: CT ABDOMEN AND PELVIS WITH CONTRAST TECHNIQUE: Multidetector CT imaging of the abdomen and pelvis was performed using the standard protocol following bolus administration of intravenous contrast. CONTRAST:  167mL ISOVUE-300 IOPAMIDOL (ISOVUE-300) INJECTION 61% COMPARISON:  02/03/2015 FINDINGS: Lower chest: Lung bases demonstrate some minimal scarring bilaterally. A hiatal hernia is noted as well. These changes are stable from the prior exam. Hepatobiliary: No focal liver abnormality is seen. No gallstones, gallbladder wall thickening, or biliary dilatation. Pancreas: Unremarkable. No pancreatic ductal dilatation or surrounding inflammatory changes. Spleen: Normal in size without focal abnormality. Adrenals/Urinary Tract: Stable left adrenal lesion is noted measuring approximately 1.4 cm consistent with an adenoma. The right adrenal is unremarkable. Renal cystic changes noted on the right. No renal calculi or obstructive changes are seen. The bladder is partially distended. Stomach/Bowel: Fecal material is noted throughout the colon. The ascending, transverse and descending colon appear within normal limits. The sigmoid colon demonstrates a significant amount of inflammatory change with surrounding adenopathy increased from the prior exam. Scattered diverticular noted in this likely represents a component of rectosigmoid diverticulitis. Adjacent to the inflamed sigmoid there is an ovoid collection of air which measures approximately 2.7 cm  in greatest dimension. This was not present on the prior exam and may represent an area of focal perforation. No focal fluid collection is identified to correspond with this abnormality. The appendix is not visualized. Vascular/Lymphatic: Aortic atherosclerosis. Mesenteric lymph nodes are identified adjacent to the inflamed sigmoid colon as previously described. The largest of these measures 10 mm in short axis. No other significant lymphadenopathy is noted. Reproductive: A large pessary is again identified in place. The uterus is shrunken consistent with the patient's given age. No adnexal mass is seen. Other: No abdominal wall hernia or abnormality. No abdominopelvic ascites. Musculoskeletal: Postsurgical changes of the lumbar spine and left hip are seen. No acute bony abnormality is noted. IMPRESSION: Diverticulosis of the colon with evidence of significant wall thickening and pericolonic inflammatory change in the region of the sigmoid colon extending into the rectum consistent with diverticulitis. A small focus of air is noted adjacent to the sigmoid consistent with a small perforation. No associated fluid component is noted. No significant free air is identified. Some associated reactive lymph nodes are noted in the mesentery. Pessary in place. Stable left adrenal adenoma. Chronic changes as described. Electronically Signed   By: Inez Catalina M.D.   On: 10/05/2017 18:45    EKG: No orders found for this or any previous visit.  IMPRESSION AND PLAN:  *  Anemia- due to acute blood loss   Likely due to diverticulitis.   Hold heparin [place], Place SCD   Iron studies   Transfuse 1 unit PRBC, cont PPI   discussed with pt about possible side effects of blood transfusion- including more common like low grade fever to rare and serious like renal and lung involvement and infections.  She understood- and due to necessity of transfusion- agreed to receive the transfusion.      GI consult.  * Acute  diverticulitis with small perforation   Monitor with IV abx.  * Htn   Cont amlodipine.   All the records are reviewed and case discussed with ED provider. Management plans discussed with the patient, family and they are in agreement.  CODE STATUS:    Code Status Orders  (From admission, onward)        Start     Ordered   10/05/17 2136  Full code  Continuous     10/05/17 2135    Code Status History    Date Active Date Inactive Code Status Order ID Comments User Context   This patient has a current code status but no historical code status.     Spoke to her son in room.  TOTAL TIME TAKING CARE OF THIS PATIENT: 45 minutes.    Denise Barry M.D on 10/06/2017   Between 7am to 6pm - Pager - [phone]  After 6pm go to [email or web address] - password [email or web address] [facility]  Office  [phone]  CC: Primary care physician; Einar Pheasant, MD   Note: This dictation was prepared with Dragon dictation along with smaller phrase technology. Any transcriptional errors that result from this process are unintentional.

## 2017-10-07 LAB — CBC
HEMATOCRIT: 21.9 % — AB (ref 35.0–47.0)
Hemoglobin: 7.1 g/dL — ABNORMAL LOW (ref 12.0–16.0)
MCH: 26.1 pg (ref 26.0–34.0)
MCHC: 32.6 g/dL (ref 32.0–36.0)
MCV: 79.8 fL — ABNORMAL LOW (ref 80.0–100.0)
Platelets: 411 10*3/uL (ref 150–440)
RBC: 2.74 MIL/uL — ABNORMAL LOW (ref 3.80–5.20)
RDW: 17 % — AB (ref 11.5–14.5)
WBC: 14.4 10*3/uL — ABNORMAL HIGH (ref 3.6–11.0)

## 2017-10-07 LAB — PREPARE RBC (CROSSMATCH)

## 2017-10-07 MED ORDER — ACETAMINOPHEN 325 MG PO TABS
650.0000 mg | ORAL_TABLET | Freq: Once | ORAL | Status: AC
  Administered 2017-10-07: 650 mg via ORAL
  Filled 2017-10-07: qty 2

## 2017-10-07 MED ORDER — SODIUM CHLORIDE 0.9 % IV SOLN
Freq: Once | INTRAVENOUS | Status: AC
  Administered 2017-10-07: 11:00:00 via INTRAVENOUS

## 2017-10-07 MED ORDER — FUROSEMIDE 10 MG/ML IJ SOLN
40.0000 mg | Freq: Once | INTRAMUSCULAR | Status: AC
  Administered 2017-10-07: 40 mg via INTRAVENOUS
  Filled 2017-10-07: qty 4

## 2017-10-07 NOTE — Progress Notes (Signed)
Talked to Dr. Windell Moment about not being able to collect a urinalysis due to the fact that when patient voids she also has a BM that mixes with the urine.  He just acknowledged and gave order to discontinue to order for the UA.

## 2017-10-07 NOTE — Progress Notes (Signed)
Patient ID: Denise Barry, female   DOB: December 03, 1933, 83 y.o.   MRN: [id number]       [facility] Day(s): 2.   Post op day(s):  N/A.   Interval History: Patient seen and examined, no acute events or new complaints overnight. Patient reports improved pain and diarrhea, denies nausea, vomiting, fever or chills.  Vital signs in last 24 hours: [min-max] current  Temp:  [97.5 F (36.4 C)-99.1 F (37.3 C)] 98.3 F (36.8 C) (12/08 1119) Pulse Rate:  [74-97] 84 (12/08 1119) Resp:  [15-18] 18 (12/08 1119) BP: (96-147)/(44-76) 130/63 (12/08 1119) SpO2:  [91 %-98 %] 94 % (12/08 1119)     Height: 5\' 3"  (160 cm) Weight: 70.3 kg (155 lb) BMI (Calculated): 27.46    Physical Exam:  Constitutional: alert, cooperative and no distress  Respiratory: breathing non-labored at rest  Cardiovascular: regular rate and sinus rhythm  Gastrointestinal: soft, mild-tender to deep palpation on left lower quadrant, and non-distended Extremities: no edema, no cyanosis  Labs:  CBC Latest Ref Rng & Units 10/07/2017 10/06/2017 10/06/2017  WBC 3.6 - 11.0 K/uL 14.4(H) - -  Hemoglobin 12.0 - 16.0 g/dL 7.1(L) 7.1(L) 6.5(L)  Hematocrit 35.0 - 47.0 % 21.9(L) 21.9(L) 20.9(L)  Platelets 150 - 440 K/uL 411 - -   CMP Latest Ref Rng & Units 10/06/2017 10/05/2017 08/28/2017  Glucose 65 - 99 mg/dL 137(H) 95 98  BUN 6 - 20 mg/dL 28(H) 35(H) 17  Creatinine 0.44 - 1.00 mg/dL 0.89 0.98 1.02(H)  Sodium 135 - 145 mmol/L 133(L) 136 138  Potassium 3.5 - 5.1 mmol/L 3.9 3.9 4.6  Chloride 101 - 111 mmol/L 102 102 96  CO2 22 - 32 mmol/L 22 23 -  Calcium 8.9 - 10.3 mg/dL 8.1(L) 9.1 9.2  Total Protein 6.5 - 8.1 g/dL - 7.2 6.5  Total Bilirubin 0.3 - 1.2 mg/dL - 0.5 <0.2  Alkaline Phos 38 - 126 U/L - 152(H) 83  AST 15 - 41 U/L - 25 16  ALT 14 - 54 U/L - 45 -    Assessment/Plan:  83 y.o. female with acute diverticulitis without abscess. Patient responding properly to conservative management. Tolerating diet  without increased pain. Will progress diet. Last WBC on 14.4 will repeat in AM and will follow hemoglobin trend to which today is stable. Encourage to ambulate and incentive spirometer.

## 2017-10-07 NOTE — Progress Notes (Signed)
Physical Therapy Evaluation Patient Details Name: Janeka Libman MRN: [id number] DOB: 1934-07-02 Today's Date: 10/07/2017   History of Present Illness  Patient is an 83 y.o. female admitted on 06 DEC with 1 week of abdominal pain. Found to have diverticulitis of large intenstine.   Clinical Impression  Patient presents to hospital for admission for above listed reasons. Patient on evaluation found to be mildly limited in gait tolerance, secondary to abdominal pain, demonstrating increased forward trunk lean and thoracic kyphosis. At baseline, patient lives with son and ambulates household distances with rollator. Patient does have history of fall with injury and will benefit from progressive PT to improve dynamic balance and postural strength, preventing future falls and improving modI function.     Follow Up Recommendations Home health PT    Equipment Recommendations  None recommended by PT    Recommendations for Other Services       Precautions / Restrictions Precautions Precautions: Fall Restrictions Weight Bearing Restrictions: No      Mobility  Bed Mobility Overal bed mobility: Modified Independent             General bed mobility comments: Patient moves from supine to sit and sit to supine with use of bed rails.  Transfers Overall transfer level: Modified independent Equipment used: Rolling walker (2 wheeled)             General transfer comment: Patient moves from sit to stand and stand to sit with good safety awareness.  Ambulation/Gait Ambulation/Gait assistance: Min guard Ambulation Distance (Feet): 40 Feet Assistive device: Rolling walker (2 wheeled)       General Gait Details: Patient ambulates with increased thoracic kyphosis. Demonstrates non-antalgic gait pattern, but limited by abdominal pain.  Stairs            Wheelchair Mobility    Modified Rankin (Stroke Patients Only)       Balance Overall balance assessment: Modified  Independent;History of Falls                                           Pertinent Vitals/Pain Pain Assessment: Faces Faces Pain Scale: Hurts a little bit Pain Location: Abdomen Pain Descriptors / Indicators: Aching Pain Intervention(s): Limited activity within patient's tolerance;Monitored during session;Repositioned    Home Living Family/patient expects to be discharged to:: Private residence Living Arrangements: Children Available Help at Discharge: Family;Available 24 hours/day Type of Home: House Home Access: Stairs to enter Entrance Stairs-Rails: Left Entrance Stairs-Number of Steps: 1 Home Layout: One level Home Equipment: Walker - 4 wheels;Cane - single point;Grab bars - toilet;Grab bars - tub/shower      Prior Function Level of Independence: Needs assistance   Gait / Transfers Assistance Needed: Household ambulation with rollator  ADL's / Homemaking Assistance Needed: Performed by son; has son or friend "close by" when showering. Hasn't driven in 6 months.        Hand Dominance        Extremity/Trunk Assessment   Upper Extremity Assessment Upper Extremity Assessment: Overall WFL for tasks assessed    Lower Extremity Assessment Lower Extremity Assessment: Overall WFL for tasks assessed    Cervical / Trunk Assessment Cervical / Trunk Assessment: Kyphotic  Communication   Communication: No difficulties  Cognition Arousal/Alertness: Awake/alert Behavior During Therapy: WFL for tasks assessed/performed Overall Cognitive Status: Within Functional Limits for tasks assessed  General Comments      Exercises     Assessment/Plan    PT Assessment Patient needs continued PT services  PT Problem List Decreased strength;Decreased activity tolerance;Decreased balance;Decreased mobility;Pain       PT Treatment Interventions DME instruction;Gait training;Stair training;Functional mobility  training;Therapeutic activities;Therapeutic exercise;Balance training;Patient/family education    PT Goals (Current goals can be found in the Care Plan section)  Acute Rehab PT Goals Patient Stated Goal: To go home PT Goal Formulation: With patient Time For Goal Achievement: 10/21/17 Potential to Achieve Goals: Good    Frequency Min 2X/week   Barriers to discharge        Co-evaluation               AM-PAC PT "6 Clicks" Daily Activity  Outcome Measure Difficulty turning over in bed (including adjusting bedclothes, sheets and blankets)?: A Little Difficulty moving from lying on back to sitting on the side of the bed? : A Little Difficulty sitting down on and standing up from a chair with arms (e.g., wheelchair, bedside commode, etc,.)?: A Little Help needed moving to and from a bed to chair (including a wheelchair)?: A Little Help needed walking in hospital room?: A Little Help needed climbing 3-5 steps with a railing? : A Little 6 Click Score: 18    End of Session Equipment Utilized During Treatment: Gait belt Activity Tolerance: Patient tolerated treatment well Patient left: in bed;with call bell/phone within reach;with bed alarm set;with family/visitor present Nurse Communication: Mobility status PT Visit Diagnosis: History of falling (Z91.81);Muscle weakness (generalized) (M62.81);Pain;Difficulty in walking, not elsewhere classified (R26.2)    Time: 1275-1700 PT Time Calculation (min) (ACUTE ONLY): 18 min   Charges:   PT Evaluation $PT Eval Low Complexity: 1 Low     PT G Codes:   PT G-Codes **NOT FOR INPATIENT CLASS** Functional Assessment Tool Used: AM-PAC 6 Clicks Basic Mobility;Clinical judgement Functional Limitation: Mobility: Walking and moving around Mobility: Walking and Moving Around Current Status (F7494): At least 40 percent but less than 60 percent impaired, limited or restricted Mobility: Walking and Moving Around Goal Status [phone]): At least 20  percent but less than 40 percent impaired, limited or restricted      Dorice Lamas, PT, DPT, COMT 10/07/2017, 3:21 PM

## 2017-10-07 NOTE — Progress Notes (Signed)
Patient ID: Denise Barry, female   DOB: 08/20/1934, 83 y.o.   MRN: [id number]  [facility] PROGRESS NOTE  Denise Barry TKZ:[id number] DOB: 05-Jul-1934 DOA: 10/05/2017 PCP: Einar Pheasant, MD  HPI/Subjective: Patient feeling okay.  Some abdominal pain.  Some loose stools.  No nausea or vomiting and tolerating liquid diet.  Objective: Vitals:   10/06/17 2041 10/07/17 0507  BP: 118/76 (!) 147/61  Pulse: 84 81  Resp: 17 17  Temp: (!) 97.5 F (36.4 C) (!) 97.5 F (36.4 C)  SpO2: 98% 98%    Filed Weights   10/05/17 1431  Weight: 70.3 kg (155 lb)    ROS: Review of Systems  Constitutional: Negative for chills and fever.  Eyes: Negative for blurred vision.  Respiratory: Negative for cough and shortness of breath.   Cardiovascular: Negative for chest pain.  Gastrointestinal: Positive for abdominal pain and diarrhea. Negative for constipation, nausea and vomiting.  Genitourinary: Negative for dysuria.  Musculoskeletal: Negative for joint pain.  Neurological: Negative for dizziness and headaches.   Exam: Physical Exam  Constitutional: She is oriented to person, place, and time.  HENT:  Nose: No mucosal edema.  Mouth/Throat: No oropharyngeal exudate or posterior oropharyngeal edema.  Eyes: Conjunctivae, EOM and lids are normal. Pupils are equal, round, and reactive to light.  Neck: No JVD present. Carotid bruit is not present. No edema present. No thyroid mass and no thyromegaly present.  Cardiovascular: S1 normal and S2 normal. Exam reveals no gallop.  No murmur heard. Pulses:      Dorsalis pedis pulses are 2+ on the right side, and 2+ on the left side.  Respiratory: No respiratory distress. She has decreased breath sounds in the right lower field and the left lower field. She has no wheezes. She has no rhonchi. She has no rales.  GI: Soft. Bowel sounds are normal. She exhibits distension. There is tenderness in the left lower quadrant.  Musculoskeletal:        Right shoulder: She exhibits no swelling.  Lymphadenopathy:    She has no cervical adenopathy.  Neurological: She is alert and oriented to person, place, and time. No cranial nerve deficit.  Skin: Skin is warm. No rash noted. Nails show no clubbing.  Psychiatric: She has a normal mood and affect.      Data Reviewed: Basic Metabolic Panel: Recent Labs  Lab 10/05/17 1144 10/06/17 0444  NA 136 133*  K 3.9 3.9  CL 102 102  CO2 23 22  GLUCOSE 95 137*  BUN 35* 28*  CREATININE 0.98 0.89  CALCIUM 9.1 8.1*  MG  --  2.2  PHOS  --  4.1   Liver Function Tests: Recent Labs  Lab 10/05/17 1144  AST 25  ALT 45  ALKPHOS 152*  BILITOT 0.5  PROT 7.2  ALBUMIN 2.9*   CBC: Recent Labs  Lab 10/05/17 1144 10/06/17 0444 10/06/17 0845 10/06/17 2005 10/07/17 0307  WBC 29.8* 23.0*  --   --  14.4*  NEUTROABS 27.8*  --   --   --   --   HGB 7.5* 5.8* 6.5* 7.1* 7.1*  HCT 23.3* 18.7* 20.9* 21.9* 21.9*  MCV 79.4* 80.5  --   --  79.8*  PLT 484* 424  --   --  411     Studies: Ct Abdomen Pelvis W Contrast  Result Date: 10/05/2017 CLINICAL DATA:  Lower abdominal pain with diarrhea EXAM: CT ABDOMEN AND PELVIS WITH CONTRAST TECHNIQUE: Multidetector CT imaging of the abdomen and pelvis  was performed using the standard protocol following bolus administration of intravenous contrast. CONTRAST:  145mL ISOVUE-300 IOPAMIDOL (ISOVUE-300) INJECTION 61% COMPARISON:  02/03/2015 FINDINGS: Lower chest: Lung bases demonstrate some minimal scarring bilaterally. A hiatal hernia is noted as well. These changes are stable from the prior exam. Hepatobiliary: No focal liver abnormality is seen. No gallstones, gallbladder wall thickening, or biliary dilatation. Pancreas: Unremarkable. No pancreatic ductal dilatation or surrounding inflammatory changes. Spleen: Normal in size without focal abnormality. Adrenals/Urinary Tract: Stable left adrenal lesion is noted measuring approximately 1.4 cm consistent with an  adenoma. The right adrenal is unremarkable. Renal cystic changes noted on the right. No renal calculi or obstructive changes are seen. The bladder is partially distended. Stomach/Bowel: Fecal material is noted throughout the colon. The ascending, transverse and descending colon appear within normal limits. The sigmoid colon demonstrates a significant amount of inflammatory change with surrounding adenopathy increased from the prior exam. Scattered diverticular noted in this likely represents a component of rectosigmoid diverticulitis. Adjacent to the inflamed sigmoid there is an ovoid collection of air which measures approximately 2.7 cm in greatest dimension. This was not present on the prior exam and may represent an area of focal perforation. No focal fluid collection is identified to correspond with this abnormality. The appendix is not visualized. Vascular/Lymphatic: Aortic atherosclerosis. Mesenteric lymph nodes are identified adjacent to the inflamed sigmoid colon as previously described. The largest of these measures 10 mm in short axis. No other significant lymphadenopathy is noted. Reproductive: A large pessary is again identified in place. The uterus is shrunken consistent with the patient's given age. No adnexal mass is seen. Other: No abdominal wall hernia or abnormality. No abdominopelvic ascites. Musculoskeletal: Postsurgical changes of the lumbar spine and left hip are seen. No acute bony abnormality is noted. IMPRESSION: Diverticulosis of the colon with evidence of significant wall thickening and pericolonic inflammatory change in the region of the sigmoid colon extending into the rectum consistent with diverticulitis. A small focus of air is noted adjacent to the sigmoid consistent with a small perforation. No associated fluid component is noted. No significant free air is identified. Some associated reactive lymph nodes are noted in the mesentery. Pessary in place. Stable left adrenal adenoma.  Chronic changes as described. Electronically Signed   By: Inez Catalina M.D.   On: 10/05/2017 18:45    Scheduled Meds: . acetaminophen  650 mg Oral Once  . amLODipine  5 mg Oral Daily  . Eslicarbazepine Acetate  1 tablet Oral TID  . furosemide  40 mg Intravenous Once  . pantoprazole (PROTONIX) IV  40 mg Intravenous QHS   Continuous Infusions: . sodium chloride Stopped (10/06/17 1400)  . sodium chloride    . piperacillin-tazobactam (ZOSYN)  IV Stopped (10/07/17 0612)    Assessment/Plan:  1. Acute on chronic blood loss anemia.  Iron deficiency anemia with iron studies.  Patient will need GI workup as outpatient.  I will give another unit of blood on today's hemoglobin of 7.1.  Stop IV fluids to avoid diluting the patient down further. 2. Weakness.  Physical therapy consultation 3. Essential hypertension on amlodipine 4. Acute diverticulitis with small perforation.  Surgery following and on empiric antibiotic Zosyn. 5. Chronic pain follows with pain management as outpatient  Code Status:     Code Status Orders  (From admission, onward)        Start     Ordered   10/05/17 2136  Full code  Continuous     10/05/17 2135  Code Status History    Date Active Date Inactive Code Status Order ID Comments User Context   This patient has a current code status but no historical code status.      Disposition Plan: As per surgery  Consultants:  General surgery  Gastroenterology  Antibiotics:  Zosyn  Time spent: 28 minutes  [place]

## 2017-10-08 LAB — CBC WITH DIFFERENTIAL/PLATELET
BASOS ABS: 0 10*3/uL (ref 0–0.1)
Basophils Relative: 0 %
Eosinophils Absolute: 0.1 10*3/uL (ref 0–0.7)
Eosinophils Relative: 1 %
HEMATOCRIT: 30.8 % — AB (ref 35.0–47.0)
HEMOGLOBIN: 10.2 g/dL — AB (ref 12.0–16.0)
LYMPHS PCT: 6 %
Lymphs Abs: 0.8 10*3/uL — ABNORMAL LOW (ref 1.0–3.6)
MCH: 26.7 pg (ref 26.0–34.0)
MCHC: 33.1 g/dL (ref 32.0–36.0)
MCV: 80.6 fL (ref 80.0–100.0)
MONO ABS: 1 10*3/uL — AB (ref 0.2–0.9)
Monocytes Relative: 7 %
NEUTROS ABS: 12.2 10*3/uL — AB (ref 1.4–6.5)
NEUTROS PCT: 86 %
Platelets: 560 10*3/uL — ABNORMAL HIGH (ref 150–440)
RBC: 3.83 MIL/uL (ref 3.80–5.20)
RDW: 17.2 % — AB (ref 11.5–14.5)
WBC: 14.1 10*3/uL — ABNORMAL HIGH (ref 3.6–11.0)

## 2017-10-08 LAB — TYPE AND SCREEN
ABO/RH(D): O POS
Antibody Screen: NEGATIVE
UNIT DIVISION: 0
Unit division: 0

## 2017-10-08 LAB — BPAM RBC
BLOOD PRODUCT EXPIRATION DATE: 201901052359
Blood Product Expiration Date: 201812272359
ISSUE DATE / TIME: 201812071541
ISSUE DATE / TIME: 201812081119
UNIT TYPE AND RH: 5100
Unit Type and Rh: 5100

## 2017-10-08 LAB — C DIFFICILE QUICK SCREEN W PCR REFLEX
C DIFFICILE (CDIFF) INTERP: NOT DETECTED
C DIFFICILE (CDIFF) TOXIN: NEGATIVE
C Diff antigen: NEGATIVE

## 2017-10-08 MED ORDER — OXYCODONE HCL 5 MG PO TABS
5.0000 mg | ORAL_TABLET | Freq: Four times a day (QID) | ORAL | Status: DC | PRN
Start: 2017-10-08 — End: 2017-10-11

## 2017-10-08 NOTE — Progress Notes (Signed)
Patient ID: Denise Barry, female   DOB: 07-10-1934, 83 y.o.   MRN: [id number]  [facility] PROGRESS NOTE  Ayaan Ringle MAU:[id number] DOB: 1934/09/23 DOA: 10/05/2017 PCP: Einar Pheasant, MD  HPI/Subjective: Patient feeling okay.  Some abdominal pain.  Some loose stools.  No nausea or vomiting and tolerating liquid diet.  Objective: Vitals:   10/08/17 0921 10/08/17 1304  BP: (!) 148/64 (!) 120/59  Pulse: 96 85  Resp: 17 (!) 22  Temp:  98.6 F (37 C)  SpO2: 96% 95%    Filed Weights   10/05/17 1431  Weight: 70.3 kg (155 lb)    ROS: Review of Systems  Constitutional: Negative for chills and fever.  Eyes: Negative for blurred vision.  Respiratory: Negative for cough and shortness of breath.   Cardiovascular: Negative for chest pain.  Gastrointestinal: Positive for abdominal pain and diarrhea. Negative for constipation, nausea and vomiting.  Genitourinary: Negative for dysuria.  Musculoskeletal: Negative for joint pain.  Neurological: Negative for dizziness and headaches.   Exam: Physical Exam  Constitutional: She is oriented to person, place, and time.  HENT:  Nose: No mucosal edema.  Mouth/Throat: No oropharyngeal exudate or posterior oropharyngeal edema.  Eyes: Conjunctivae, EOM and lids are normal. Pupils are equal, round, and reactive to light.  Neck: No JVD present. Carotid bruit is not present. No edema present. No thyroid mass and no thyromegaly present.  Cardiovascular: S1 normal and S2 normal. Exam reveals no gallop.  No murmur heard. Pulses:      Dorsalis pedis pulses are 2+ on the right side, and 2+ on the left side.  Respiratory: No respiratory distress. She has decreased breath sounds in the right lower field and the left lower field. She has no wheezes. She has no rhonchi. She has no rales.  GI: Soft. Bowel sounds are normal. She exhibits distension. There is tenderness in the left lower quadrant.  Musculoskeletal:       Right shoulder:  She exhibits no swelling.  Lymphadenopathy:    She has no cervical adenopathy.  Neurological: She is alert and oriented to person, place, and time. No cranial nerve deficit.  Skin: Skin is warm. No rash noted. Nails show no clubbing.  Psychiatric: She has a normal mood and affect.      Data Reviewed: Basic Metabolic Panel: Recent Labs  Lab 10/05/17 1144 10/06/17 0444  NA 136 133*  K 3.9 3.9  CL 102 102  CO2 23 22  GLUCOSE 95 137*  BUN 35* 28*  CREATININE 0.98 0.89  CALCIUM 9.1 8.1*  MG  --  2.2  PHOS  --  4.1   Liver Function Tests: Recent Labs  Lab 10/05/17 1144  AST 25  ALT 45  ALKPHOS 152*  BILITOT 0.5  PROT 7.2  ALBUMIN 2.9*   CBC: Recent Labs  Lab 10/05/17 1144 10/06/17 0444 10/06/17 0845 10/06/17 2005 10/07/17 0307 10/08/17 0330  WBC 29.8* 23.0*  --   --  14.4* 14.1*  NEUTROABS 27.8*  --   --   --   --  12.2*  HGB 7.5* 5.8* 6.5* 7.1* 7.1* 10.2*  HCT 23.3* 18.7* 20.9* 21.9* 21.9* 30.8*  MCV 79.4* 80.5  --   --  79.8* 80.6  PLT 484* 424  --   --  411 560*     Studies: No results found.  Scheduled Meds: . amLODipine  5 mg Oral Daily  . Eslicarbazepine Acetate  1 tablet Oral TID  . pantoprazole (PROTONIX) IV  40 mg Intravenous QHS   Continuous Infusions: . sodium chloride Stopped (10/06/17 1400)  . piperacillin-tazobactam (ZOSYN)  IV Stopped (10/08/17 1349)    Assessment/Plan:  1. Acute on chronic blood loss anemia.  Iron deficiency anemia with iron studies.  Patient will need GI workup as outpatient.  Responded good to blood 2. Weakness.  Physical therapy consultation 3. Essential hypertension on amlodipine 4. Acute diverticulitis with small perforation.  Surgery following and on empiric antibiotic Zosyn.  Patient with abdominal pain and diarrhea.  Stool for C. difficile negative. 5. Chronic pain follows with pain management as outpatient  Code Status:     Code Status Orders  (From admission, onward)        Start     Ordered    10/05/17 2136  Full code  Continuous     10/05/17 2135    Code Status History    Date Active Date Inactive Code Status Order ID Comments User Context   This patient has a current code status but no historical code status.      Disposition Plan: As per surgery  Consultants:  General surgery  Gastroenterology  Antibiotics:  Zosyn  Time spent: 25 minutes  [place]

## 2017-10-08 NOTE — Progress Notes (Signed)
Patient ID: Denise Barry, female   DOB: 1934/05/20, 83 y.o.   MRN: [id number]      [place] Hospital Day(s): 3.   Post op day(s):  Marland Kitchen   Interval History: Patient seen and examined with same mild abdominal pain. Patient reports tolerating diet,which is not making the pain worse at this moment. Refers difficulty sleeping due to diarrhea. Denies worsening abdominal pain. Denies nausea or vomiting.  Vital signs in last 24 hours: [min-max] current  Temp:  [98.1 F (36.7 C)-98.6 F (37 C)] 98.6 F (37 C) (12/09 0434) Pulse Rate:  [82-115] 97 (12/09 0434) Resp:  [2-18] 18 (12/09 0434) BP: (103-155)/(49-79) 155/69 (12/09 0434) SpO2:  [94 %-95 %] 94 % (12/09 0434)     Height: 5\' 3"  (160 cm) Weight: 70.3 kg (155 lb) BMI (Calculated): 27.46    Physical Exam:  Constitutional: alert, cooperative and no distress  Respiratory: breathing non-labored at rest  Cardiovascular: regular rate and sinus rhythm  Gastrointestinal: soft, mild-tender on left lower quadrant and suprapubic area, non-distended  Labs:  CBC Latest Ref Rng & Units 10/08/2017 10/07/2017 10/06/2017  WBC 3.6 - 11.0 K/uL 14.1(H) 14.4(H) -  Hemoglobin 12.0 - 16.0 g/dL 10.2(L) 7.1(L) 7.1(L)  Hematocrit 35.0 - 47.0 % 30.8(L) 21.9(L) 21.9(L)  Platelets 150 - 440 K/uL 560(H) 411 -   CMP Latest Ref Rng & Units 10/06/2017 10/05/2017 08/28/2017  Glucose 65 - 99 mg/dL 137(H) 95 98  BUN 6 - 20 mg/dL 28(H) 35(H) 17  Creatinine 0.44 - 1.00 mg/dL 0.89 0.98 1.02(H)  Sodium 135 - 145 mmol/L 133(L) 136 138  Potassium 3.5 - 5.1 mmol/L 3.9 3.9 4.6  Chloride 101 - 111 mmol/L 102 102 96  CO2 22 - 32 mmol/L 22 23 -  Calcium 8.9 - 10.3 mg/dL 8.1(L) 9.1 9.2  Total Protein 6.5 - 8.1 g/dL - 7.2 6.5  Total Bilirubin 0.3 - 1.2 mg/dL - 0.5 <0.2  Alkaline Phos 38 - 126 U/L - 152(H) 83  AST 15 - 41 U/L - 25 16  ALT 14 - 54 U/L - 45 -    Assessment/Plan:  83 y.o. female with acute diverticulitis without abscess.  Tolerating diet  without increased pain. Will keep diet as pain did not improved since yesterday (not worse either). Last WBC on 14.1 which stay the same (elevated). Will repeat in AM and will follow. Hemoglobin significantly increased. Will follow diarrhea episodes today before deciding if need antidiarrheals. Oriented to call in the night if not able to sleep today.  Encourage to ambulate and incentive spirometer.

## 2017-10-09 DIAGNOSIS — D649 Anemia, unspecified: Secondary | ICD-10-CM

## 2017-10-09 DIAGNOSIS — R109 Unspecified abdominal pain: Secondary | ICD-10-CM

## 2017-10-09 LAB — CBC WITH DIFFERENTIAL/PLATELET
BASOS PCT: 1 %
Basophils Absolute: 0.1 10*3/uL (ref 0–0.1)
Eosinophils Absolute: 0.2 10*3/uL (ref 0–0.7)
Eosinophils Relative: 1 %
HEMATOCRIT: 31.5 % — AB (ref 35.0–47.0)
HEMOGLOBIN: 10.3 g/dL — AB (ref 12.0–16.0)
Lymphocytes Relative: 4 %
Lymphs Abs: 0.6 10*3/uL — ABNORMAL LOW (ref 1.0–3.6)
MCH: 26.6 pg (ref 26.0–34.0)
MCHC: 32.6 g/dL (ref 32.0–36.0)
MCV: 81.5 fL (ref 80.0–100.0)
MONOS PCT: 8 %
Monocytes Absolute: 1.2 10*3/uL — ABNORMAL HIGH (ref 0.2–0.9)
NEUTROS ABS: 13.7 10*3/uL — AB (ref 1.4–6.5)
NEUTROS PCT: 86 %
Platelets: 573 10*3/uL — ABNORMAL HIGH (ref 150–440)
RBC: 3.87 MIL/uL (ref 3.80–5.20)
RDW: 17.2 % — ABNORMAL HIGH (ref 11.5–14.5)
WBC: 15.8 10*3/uL — AB (ref 3.6–11.0)

## 2017-10-09 MED ORDER — AMLODIPINE BESYLATE 10 MG PO TABS
10.0000 mg | ORAL_TABLET | Freq: Every day | ORAL | Status: DC
Start: 2017-10-09 — End: 2017-10-11
  Administered 2017-10-09 – 2017-10-11 (×3): 10 mg via ORAL
  Filled 2017-10-09 (×3): qty 1

## 2017-10-09 NOTE — Progress Notes (Signed)
PT Cancellation Note  Patient Details Name: Denise Barry MRN: [id number] DOB: 1934/02/07   Cancelled Treatment:    Reason Eval/Treat Not Completed: Other (comment). Treatment attempted, Pt reports she just got back into bed. She reports she's been up in chair all morning and would rather not ambulate right now. Said she would call RN staff if she felt like walking later. Requesting therapy to re-attempt next date.   Juliocesar Blasius 10/09/2017, 2:14 PM Greggory Stallion, PT, DPT [phone]

## 2017-10-09 NOTE — Progress Notes (Signed)
CC: Abdominal pain Subjective: This patient admitted to the hospital with a diagnosis of acute abdominal pain secondary to diverticulitis.  She required a transfusion the other day but today she states she feels so much better and is doing well.  She has no nausea vomiting fevers or chills and is passing gas.  Objective: Vital signs in last 24 hours: Temp:  [97.6 F (36.4 C)-98.6 F (37 C)] 98.1 F (36.7 C) (12/10 0501) Pulse Rate:  [83-101] 101 (12/10 0501) Resp:  [17-22] 17 (12/09 2056) BP: (120-175)/(59-80) 175/80 (12/10 0501) SpO2:  [95 %-100 %] 100 % (12/10 0501) Last BM Date: 10/09/17  Intake/Output from previous day: 12/09 0701 - 12/10 0700 In: 540 [P.O.:540] Out: -  Intake/Output this shift: Total I/O In: -  Out: 2 [Urine:1; Stool:1]  Physical exam:  Awake alert and oriented vital signs reviewed and stable afebrile.  Abdomen is slightly distended slightly tympanitic but nontender.  Calves are nontender.  No icterus no jaundice  Lab Results: CBC  Recent Labs    10/08/17 0330 10/09/17 0342  WBC 14.1* 15.8*  HGB 10.2* 10.3*  HCT 30.8* 31.5*  PLT 560* 573*   BMET No results for input(s): NA, K, CL, CO2, GLUCOSE, BUN, CREATININE, CALCIUM in the last 72 hours. PT/INR No results for input(s): LABPROT, INR in the last 72 hours. ABG No results for input(s): PHART, HCO3 in the last 72 hours.  Invalid input(s): PCO2, PO2  Studies/Results: No results found.  Anti-infectives: Anti-infectives (From admission, onward)   Start     Dose/Rate Route Frequency Ordered Stop   10/06/17 0100  piperacillin-tazobactam (ZOSYN) IVPB 3.375 g     3.375 g 12.5 mL/hr over 240 Minutes Intravenous Every 8 hours 10/05/17 2135     10/05/17 1915  piperacillin-tazobactam (ZOSYN) IVPB 3.375 g     3.375 g 100 mL/hr over 30 Minutes Intravenous  Once 10/05/17 1906 10/05/17 1943   10/05/17 1900  metroNIDAZOLE (FLAGYL) IVPB 500 mg  Status:  Discontinued     500 mg 100 mL/hr over 60  Minutes Intravenous  Once 10/05/17 1857 10/05/17 1906   10/05/17 1900  ciprofloxacin (CIPRO) IVPB 400 mg  Status:  Discontinued     400 mg 200 mL/hr over 60 Minutes Intravenous  Once 10/05/17 1857 10/05/17 1906      Assessment/Plan:  Patient feels much better today.  I had considered obtaining a CT scan but in discussion with his prime doc and the patient's considerable improvement we will forego a CT scan today.  We need to watch her white blood cell count as it remains elevated today.  I would consider a CT scan tomorrow should she not continue to improve  Florene Glen, MD, FACS  10/09/2017

## 2017-10-09 NOTE — Progress Notes (Signed)
Patient ID: Denise Barry, female   DOB: Jun 11, 1934, 83 y.o.   MRN: [id number]  [facility] PROGRESS NOTE  Denise Barry KGY:[id number] DOB: 04-14-1934 DOA: 10/05/2017 PCP: Einar Pheasant, MD  HPI/Subjective: Patient feels better this morning when I saw her.  Abdominal pain better.  Diarrhea seems to have resolved and had a formed bowel movement today.  Objective: Vitals:   10/08/17 2056 10/09/17 0501  BP: 140/68 (!) 175/80  Pulse: 83 (!) 101  Resp: 17   Temp: 97.6 F (36.4 C) 98.1 F (36.7 C)  SpO2: 96% 100%    Filed Weights   10/05/17 1431  Weight: 70.3 kg (155 lb)    ROS: Review of Systems  Constitutional: Negative for chills and fever.  Eyes: Negative for blurred vision.  Respiratory: Negative for cough and shortness of breath.   Cardiovascular: Negative for chest pain.  Gastrointestinal: Negative for abdominal pain, constipation, diarrhea, nausea and vomiting.  Genitourinary: Negative for dysuria.  Musculoskeletal: Negative for joint pain.  Neurological: Negative for dizziness and headaches.   Exam: Physical Exam  Constitutional: She is oriented to person, place, and time.  HENT:  Nose: No mucosal edema.  Mouth/Throat: No oropharyngeal exudate or posterior oropharyngeal edema.  Eyes: Conjunctivae, EOM and lids are normal. Pupils are equal, round, and reactive to light.  Neck: No JVD present. Carotid bruit is not present. No edema present. No thyroid mass and no thyromegaly present.  Cardiovascular: S1 normal and S2 normal. Exam reveals no gallop.  No murmur heard. Pulses:      Dorsalis pedis pulses are 2+ on the right side, and 2+ on the left side.  Respiratory: No respiratory distress. She has decreased breath sounds in the right lower field and the left lower field. She has no wheezes. She has no rhonchi. She has no rales.  GI: Soft. Bowel sounds are normal. She exhibits no distension. There is no tenderness.  Musculoskeletal:       Right  shoulder: She exhibits no swelling.  Lymphadenopathy:    She has no cervical adenopathy.  Neurological: She is alert and oriented to person, place, and time. No cranial nerve deficit.  Skin: Skin is warm. No rash noted. Nails show no clubbing.  Psychiatric: She has a normal mood and affect.      Data Reviewed: Basic Metabolic Panel: Recent Labs  Lab 10/05/17 1144 10/06/17 0444  NA 136 133*  K 3.9 3.9  CL 102 102  CO2 23 22  GLUCOSE 95 137*  BUN 35* 28*  CREATININE 0.98 0.89  CALCIUM 9.1 8.1*  MG  --  2.2  PHOS  --  4.1   Liver Function Tests: Recent Labs  Lab 10/05/17 1144  AST 25  ALT 45  ALKPHOS 152*  BILITOT 0.5  PROT 7.2  ALBUMIN 2.9*   CBC: Recent Labs  Lab 10/05/17 1144 10/06/17 0444 10/06/17 0845 10/06/17 2005 10/07/17 0307 10/08/17 0330 10/09/17 0342  WBC 29.8* 23.0*  --   --  14.4* 14.1* 15.8*  NEUTROABS 27.8*  --   --   --   --  12.2* 13.7*  HGB 7.5* 5.8* 6.5* 7.1* 7.1* 10.2* 10.3*  HCT 23.3* 18.7* 20.9* 21.9* 21.9* 30.8* 31.5*  MCV 79.4* 80.5  --   --  79.8* 80.6 81.5  PLT 484* 424  --   --  411 560* 573*     Studies: No results found.  Scheduled Meds: . amLODipine  10 mg Oral Daily  . Eslicarbazepine Acetate  1 tablet Oral TID  . pantoprazole (PROTONIX) IV  40 mg Intravenous QHS   Continuous Infusions: . sodium chloride Stopped (10/06/17 1400)  . piperacillin-tazobactam (ZOSYN)  IV Stopped (10/09/17 1413)    Assessment/Plan:  1. Acute on chronic blood loss anemia.  Iron deficiency anemia with iron studies.  Patient will need GI workup as outpatient.  Responded good to blood transfusion. 2. Weakness.  Physical therapy consultation appreciated  3. essential hypertension on amlodipine.  I increased the dose to her home dose 10 mg daily with her high blood pressure this morning. 4. Acute diverticulitis with small perforation.  Surgery following and on empiric antibiotic Zosyn.  Patient feeling better today.  Diet advanced by general  surgery. 5. Chronic pain follows with pain management as outpatient  Code Status:     Code Status Orders  (From admission, onward)        Start     Ordered   10/05/17 2136  Full code  Continuous     10/05/17 2135    Code Status History    Date Active Date Inactive Code Status Order ID Comments User Context   This patient has a current code status but no historical code status.      Disposition Plan: As per surgery  Consultants:  General surgery  Gastroenterology  Antibiotics:  Zosyn  Time spent: 24 minutes  [place]

## 2017-10-10 LAB — CBC WITH DIFFERENTIAL/PLATELET
Basophils Absolute: 0 10*3/uL (ref 0–0.1)
Basophils Relative: 0 %
EOS PCT: 1 %
Eosinophils Absolute: 0.2 10*3/uL (ref 0–0.7)
HCT: 29 % — ABNORMAL LOW (ref 35.0–47.0)
Hemoglobin: 9.3 g/dL — ABNORMAL LOW (ref 12.0–16.0)
LYMPHS ABS: 0.9 10*3/uL — AB (ref 1.0–3.6)
LYMPHS PCT: 6 %
MCH: 26.3 pg (ref 26.0–34.0)
MCHC: 32.1 g/dL (ref 32.0–36.0)
MCV: 82 fL (ref 80.0–100.0)
MONO ABS: 1 10*3/uL — AB (ref 0.2–0.9)
MONOS PCT: 7 %
Neutro Abs: 12.4 10*3/uL — ABNORMAL HIGH (ref 1.4–6.5)
Neutrophils Relative %: 86 %
PLATELETS: 614 10*3/uL — AB (ref 150–440)
RBC: 3.54 MIL/uL — ABNORMAL LOW (ref 3.80–5.20)
RDW: 17.2 % — AB (ref 11.5–14.5)
WBC: 14.5 10*3/uL — ABNORMAL HIGH (ref 3.6–11.0)

## 2017-10-10 NOTE — Progress Notes (Signed)
CC: Diverticulitis Subjective: Patient feels better today she has minimal if any left lower quadrant pain.  She is tolerating a regular diet.  No fevers or chills no nausea or vomiting  Objective: Vital signs in last 24 hours: Temp:  [97.7 F (36.5 C)-98.8 F (37.1 C)] 97.9 F (36.6 C) (12/11 0442) Pulse Rate:  [76-102] 76 (12/11 0442) BP: (107-131)/(55-72) 131/55 (12/11 0442) SpO2:  [95 %-98 %] 98 % (12/11 0442) Last BM Date: 10/09/17  Intake/Output from previous day: 12/10 0701 - 12/11 0700 In: 1090 [P.O.:1040; IV Piggyback:50] Out: 652 [Urine:651; Stool:1] Intake/Output this shift: Total I/O In: -  Out: 200 [Urine:200]  Physical exam:  Vital signs reviewed and stable afebrile abdomen is soft minimally tympanitic and nontender no peritoneal signs nontender calves no icterus no jaundice  Lab Results: CBC  Recent Labs    10/09/17 0342 10/10/17 0420  WBC 15.8* 14.5*  HGB 10.3* 9.3*  HCT 31.5* 29.0*  PLT 573* 614*   BMET No results for input(s): NA, K, CL, CO2, GLUCOSE, BUN, CREATININE, CALCIUM in the last 72 hours. PT/INR No results for input(s): LABPROT, INR in the last 72 hours. ABG No results for input(s): PHART, HCO3 in the last 72 hours.  Invalid input(s): PCO2, PO2  Studies/Results: No results found.  Anti-infectives: Anti-infectives (From admission, onward)   Start     Dose/Rate Route Frequency Ordered Stop   10/06/17 0100  piperacillin-tazobactam (ZOSYN) IVPB 3.375 g     3.375 g 12.5 mL/hr over 240 Minutes Intravenous Every 8 hours 10/05/17 2135     10/05/17 1915  piperacillin-tazobactam (ZOSYN) IVPB 3.375 g     3.375 g 100 mL/hr over 30 Minutes Intravenous  Once 10/05/17 1906 10/05/17 1943   10/05/17 1900  metroNIDAZOLE (FLAGYL) IVPB 500 mg  Status:  Discontinued     500 mg 100 mL/hr over 60 Minutes Intravenous  Once 10/05/17 1857 10/05/17 1906   10/05/17 1900  ciprofloxacin (CIPRO) IVPB 400 mg  Status:  Discontinued     400 mg 200 mL/hr over  60 Minutes Intravenous  Once 10/05/17 1857 10/05/17 1906      Assessment/Plan:  White blood cell count remains elevated.  We will continue IV antibiotics for now as she somewhat difficult course and would benefit from more aggressive therapy with IVs as opposed to oral antibiotics and discharge at this time.  Florene Glen, MD, FACS  10/10/2017

## 2017-10-10 NOTE — Progress Notes (Signed)
Patient ID: Denise Barry, female   DOB: 1934-01-07, 83 y.o.   MRN: [id number]  [facility] PROGRESS NOTE  Gratia Disla KYH:[id number] DOB: 20-Mar-1934 DOA: 10/05/2017 PCP: Einar Pheasant, MD  HPI/Subjective: Patient feeling a little bit better.  Had a little diarrhea today.  No pain in the abdomen.  Objective: Vitals:   10/10/17 0920 10/10/17 1202  BP: (!) 128/59 (!) 138/59  Pulse: 75 97  Resp:    Temp:  97.6 F (36.4 C)  SpO2:  95%    Filed Weights   10/05/17 1431  Weight: 70.3 kg (155 lb)    ROS: Review of Systems  Constitutional: Negative for chills and fever.  Eyes: Negative for blurred vision.  Respiratory: Negative for cough and shortness of breath.   Cardiovascular: Negative for chest pain.  Gastrointestinal: Positive for diarrhea. Negative for abdominal pain, constipation, nausea and vomiting.  Genitourinary: Negative for dysuria.  Musculoskeletal: Negative for joint pain.  Neurological: Negative for dizziness and headaches.   Exam: Physical Exam  Constitutional: She is oriented to person, place, and time.  HENT:  Nose: No mucosal edema.  Mouth/Throat: No oropharyngeal exudate or posterior oropharyngeal edema.  Eyes: Conjunctivae, EOM and lids are normal. Pupils are equal, round, and reactive to light.  Neck: No JVD present. Carotid bruit is not present. No edema present. No thyroid mass and no thyromegaly present.  Cardiovascular: S1 normal and S2 normal. Exam reveals no gallop.  No murmur heard. Pulses:      Dorsalis pedis pulses are 2+ on the right side, and 2+ on the left side.  Respiratory: No respiratory distress. She has decreased breath sounds in the right lower field and the left lower field. She has no wheezes. She has no rhonchi. She has no rales.  GI: Soft. Bowel sounds are normal. She exhibits no distension. There is no tenderness.  Musculoskeletal:       Right shoulder: She exhibits no swelling.  Lymphadenopathy:    She  has no cervical adenopathy.  Neurological: She is alert and oriented to person, place, and time. No cranial nerve deficit.  Skin: Skin is warm. No rash noted. Nails show no clubbing.  Psychiatric: She has a normal mood and affect.      Data Reviewed: Basic Metabolic Panel: Recent Labs  Lab 10/05/17 1144 10/06/17 0444  NA 136 133*  K 3.9 3.9  CL 102 102  CO2 23 22  GLUCOSE 95 137*  BUN 35* 28*  CREATININE 0.98 0.89  CALCIUM 9.1 8.1*  MG  --  2.2  PHOS  --  4.1   Liver Function Tests: Recent Labs  Lab 10/05/17 1144  AST 25  ALT 45  ALKPHOS 152*  BILITOT 0.5  PROT 7.2  ALBUMIN 2.9*   CBC: Recent Labs  Lab 10/05/17 1144 10/06/17 0444  10/06/17 2005 10/07/17 0307 10/08/17 0330 10/09/17 0342 10/10/17 0420  WBC 29.8* 23.0*  --   --  14.4* 14.1* 15.8* 14.5*  NEUTROABS 27.8*  --   --   --   --  12.2* 13.7* 12.4*  HGB 7.5* 5.8*   < > 7.1* 7.1* 10.2* 10.3* 9.3*  HCT 23.3* 18.7*   < > 21.9* 21.9* 30.8* 31.5* 29.0*  MCV 79.4* 80.5  --   --  79.8* 80.6 81.5 82.0  PLT 484* 424  --   --  411 560* 573* 614*   < > = values in this interval not displayed.      Scheduled Meds: .  amLODipine  10 mg Oral Daily  . Eslicarbazepine Acetate  1 tablet Oral TID  . pantoprazole (PROTONIX) IV  40 mg Intravenous QHS   Continuous Infusions: . sodium chloride Stopped (10/06/17 1400)  . piperacillin-tazobactam (ZOSYN)  IV 3.375 g (10/10/17 0904)    Assessment/Plan:  1. Acute on chronic blood loss anemia.  Iron deficiency anemia with iron studies.  Patient will need GI workup as outpatient.  Responded good to blood transfusion.  Hemoglobin drifted a little bit lower today. 2. Weakness.  Physical therapy consultation appreciated  3. Essential hypertension on amlodipine.  Continue Norvasc 10 mg daily. 4. Acute diverticulitis with small perforation.  Surgery following and on empiric antibiotic Zosyn.  Diet advanced by general surgery. 5. Chronic pain follows with pain management as  outpatient  Code Status:     Code Status Orders  (From admission, onward)        Start     Ordered   10/05/17 2136  Full code  Continuous     10/05/17 2135    Code Status History    Date Active Date Inactive Code Status Order ID Comments User Context   This patient has a current code status but no historical code status.      Disposition Plan: As per surgery  Consultants:  General surgery  Gastroenterology  Antibiotics:  Zosyn  Time spent: 23 minutes  [place]

## 2017-10-10 NOTE — Progress Notes (Signed)
Physical Therapy Treatment Patient Details Name: Tannie Koskela MRN: [id number] DOB: May 06, 1934 Today's Date: 10/10/2017    History of Present Illness Patient is an 83 y.o. female admitted on 06 DEC with 1 week of abdominal pain. Found to have diverticulitis of large intenstine.     PT Comments    Pt is making good progress towards goals, doubling ambulation distance this session. Still unsteady, needs RW for balance/endurance. Able to participate in there-ex in bed, encouraged to continue performance when therapy out of room. Refused to currently sit in recliner, however agreeable later this date. Will continue to progress as able.   Follow Up Recommendations  Home health PT     Equipment Recommendations  None recommended by PT    Recommendations for Other Services       Precautions / Restrictions Precautions Precautions: Fall Restrictions Weight Bearing Restrictions: No    Mobility  Bed Mobility Overal bed mobility: Modified Independent             General bed mobility comments: Patient moves from supine to sit and sit to supine with use of bed rails.  Transfers Overall transfer level: Modified independent Equipment used: Rolling walker (2 wheeled)             General transfer comment: Patient moves from sit to stand and stand to sit with good safety awareness.  Ambulation/Gait Ambulation/Gait assistance: Min guard Ambulation Distance (Feet): 80 Feet Assistive device: Rolling walker (2 wheeled) Gait Pattern/deviations: Step-through pattern     General Gait Details: ambulates with slight unsteadiness, reports slight dizziness. Small BOS and shuffling of feet. No formal LOB noted, however unsteady. R LE appears weaker in functional mobility.   Stairs            Wheelchair Mobility    Modified Rankin (Stroke Patients Only)       Balance                                            Cognition Arousal/Alertness:  Awake/alert Behavior During Therapy: WFL for tasks assessed/performed Overall Cognitive Status: Within Functional Limits for tasks assessed                                        Exercises Other Exercises Other Exercises: Supine ther-ex performed on B LE including ankle pumps, SLRs, hip abd/add, hip add squeezes, SAQ, glut squeezes. All ther-ex performed x 12 reps with cga. Safe technique performed    General Comments        Pertinent Vitals/Pain Pain Assessment: No/denies pain    Home Living                      Prior Function            PT Goals (current goals can now be found in the care plan section) Acute Rehab PT Goals Patient Stated Goal: To go home PT Goal Formulation: With patient Time For Goal Achievement: 10/21/17 Potential to Achieve Goals: Good Progress towards PT goals: Progressing toward goals    Frequency    Min 2X/week      PT Plan Current plan remains appropriate    Co-evaluation              AM-PAC PT "6 Clicks" Daily  Activity  Outcome Measure  Difficulty turning over in bed (including adjusting bedclothes, sheets and blankets)?: None Difficulty moving from lying on back to sitting on the side of the bed? : None Difficulty sitting down on and standing up from a chair with arms (e.g., wheelchair, bedside commode, etc,.)?: None Help needed moving to and from a bed to chair (including a wheelchair)?: A Little Help needed walking in hospital room?: A Little Help needed climbing 3-5 steps with a railing? : A Little 6 Click Score: 21    End of Session Equipment Utilized During Treatment: Gait belt Activity Tolerance: Patient tolerated treatment well Patient left: in bed;with call bell/phone within reach;with bed alarm set;with family/visitor present Nurse Communication: Mobility status PT Visit Diagnosis: History of falling (Z91.81);Muscle weakness (generalized) (M62.81);Pain;Difficulty in walking, not elsewhere  classified (R26.2)     Time: 7342-8768 PT Time Calculation (min) (ACUTE ONLY): 23 min  Charges:  $Gait Training: 8-22 mins $Therapeutic Exercise: 8-22 mins                    G Codes:  Functional Assessment Tool Used: AM-PAC 6 Clicks Basic Mobility;Clinical judgement Functional Limitation: Mobility: Walking and moving around Mobility: Walking and Moving Around Current Status (T1572): At least 20 percent but less than 40 percent impaired, limited or restricted Mobility: Walking and Moving Around Goal Status [id number]): At least 1 percent but less than 20 percent impaired, limited or restricted    Greggory Stallion, PT, DPT [phone]    Ashe Gago 10/10/2017, 11:42 AM

## 2017-10-11 ENCOUNTER — Telehealth: Payer: Self-pay

## 2017-10-11 ENCOUNTER — Telehealth: Payer: Self-pay | Admitting: Pain Medicine

## 2017-10-11 LAB — CBC WITH DIFFERENTIAL/PLATELET
BASOS ABS: 0.1 10*3/uL (ref 0–0.1)
BASOS PCT: 1 %
EOS PCT: 2 %
Eosinophils Absolute: 0.2 10*3/uL (ref 0–0.7)
HCT: 28.8 % — ABNORMAL LOW (ref 35.0–47.0)
Hemoglobin: 9.3 g/dL — ABNORMAL LOW (ref 12.0–16.0)
Lymphocytes Relative: 8 %
Lymphs Abs: 0.8 10*3/uL — ABNORMAL LOW (ref 1.0–3.6)
MCH: 26.6 pg (ref 26.0–34.0)
MCHC: 32.5 g/dL (ref 32.0–36.0)
MCV: 82.1 fL (ref 80.0–100.0)
MONO ABS: 0.9 10*3/uL (ref 0.2–0.9)
Monocytes Relative: 10 %
Neutro Abs: 7.8 10*3/uL — ABNORMAL HIGH (ref 1.4–6.5)
Neutrophils Relative %: 79 %
PLATELETS: 618 10*3/uL — AB (ref 150–440)
RBC: 3.51 MIL/uL — ABNORMAL LOW (ref 3.80–5.20)
RDW: 17.5 % — AB (ref 11.5–14.5)
WBC: 9.7 10*3/uL (ref 3.6–11.0)

## 2017-10-11 MED ORDER — OXYCODONE HCL 5 MG PO TABS: 5.0000 mg | ORAL_TABLET | Freq: Four times a day (QID) | ORAL | 0 refills | Status: DC | PRN

## 2017-10-11 MED ORDER — CIPROFLOXACIN HCL 500 MG PO TABS: 500.0000 mg | ORAL_TABLET | Freq: Two times a day (BID) | ORAL | 1 refills | Status: DC

## 2017-10-11 MED ORDER — METRONIDAZOLE 500 MG PO TABS: 500.0000 mg | ORAL_TABLET | Freq: Three times a day (TID) | ORAL | 1 refills | Status: DC

## 2017-10-11 NOTE — Telephone Encounter (Signed)
Copied from [place] [id number] [phone]. Topic: Appointment Scheduling - Scheduling Inquiry for Clinic >> Oct 11, 2017  8:18 AM Ether Griffins B wrote: Reason for CRM: pt needing hospital follow up 2 weeks out with Nicki Reaper being discharged today 10/11/17

## 2017-10-11 NOTE — Progress Notes (Signed)
Patient ID: Denise Barry, female   DOB: 03/04/1934, 83 y.o.   MRN: [id number]  [facility] PROGRESS NOTE  Denise Barry [id number]:[id number] DOB: 07/29/1934 DOA: 10/05/2017 PCP: Einar Pheasant, MD  HPI/Subjective: Patient was concerned about going home but did well enough with physical therapy and they recommended home with home health.  Patient not having any abdominal pain currently.  Objective: Vitals:   10/11/17 0420 10/11/17 0950  BP: 128/61 130/63  Pulse: 87 88  Resp: 19 18  Temp: 98.1 F (36.7 C) 98 F (36.7 C)  SpO2: 95% 96%    Filed Weights   10/05/17 1431  Weight: 70.3 kg (155 lb)    ROS: Review of Systems  Constitutional: Negative for chills and fever.  Eyes: Negative for blurred vision.  Respiratory: Negative for cough and shortness of breath.   Cardiovascular: Negative for chest pain.  Gastrointestinal: Negative for abdominal pain, constipation, diarrhea, nausea and vomiting.  Genitourinary: Negative for dysuria.  Musculoskeletal: Negative for joint pain.  Neurological: Negative for dizziness and headaches.   Exam: Physical Exam  Constitutional: She is oriented to person, place, and time.  HENT:  Nose: No mucosal edema.  Mouth/Throat: No oropharyngeal exudate or posterior oropharyngeal edema.  Eyes: Conjunctivae, EOM and lids are normal. Pupils are equal, round, and reactive to light.  Neck: No JVD present. Carotid bruit is not present. No edema present. No thyroid mass and no thyromegaly present.  Cardiovascular: S1 normal and S2 normal. Exam reveals no gallop.  No murmur heard. Pulses:      Dorsalis pedis pulses are 2+ on the right side, and 2+ on the left side.  Respiratory: No respiratory distress. She has decreased breath sounds in the right lower field and the left lower field. She has no wheezes. She has no rhonchi. She has no rales.  GI: Soft. Bowel sounds are normal. She exhibits no distension. There is no tenderness.   Musculoskeletal:       Right ankle: She exhibits swelling.       Left ankle: She exhibits swelling.  Lymphadenopathy:    She has no cervical adenopathy.  Neurological: She is alert and oriented to person, place, and time. No cranial nerve deficit.  Skin: Skin is warm. No rash noted. Nails show no clubbing.  Psychiatric: She has a normal mood and affect.      Data Reviewed: Basic Metabolic Panel: Recent Labs  Lab 10/05/17 1144 10/06/17 0444  NA 136 133*  K 3.9 3.9  CL 102 102  CO2 23 22  GLUCOSE 95 137*  BUN 35* 28*  CREATININE 0.98 0.89  CALCIUM 9.1 8.1*  MG  --  2.2  PHOS  --  4.1   Liver Function Tests: Recent Labs  Lab 10/05/17 1144  AST 25  ALT 45  ALKPHOS 152*  BILITOT 0.5  PROT 7.2  ALBUMIN 2.9*   CBC: Recent Labs  Lab 10/05/17 1144  10/07/17 0307 10/08/17 0330 10/09/17 0342 10/10/17 0420 10/11/17 0435  WBC 29.8*   < > 14.4* 14.1* 15.8* 14.5* 9.7  NEUTROABS 27.8*  --   --  12.2* 13.7* 12.4* 7.8*  HGB 7.5*   < > 7.1* 10.2* 10.3* 9.3* 9.3*  HCT 23.3*   < > 21.9* 30.8* 31.5* 29.0* 28.8*  MCV 79.4*   < > 79.8* 80.6 81.5 82.0 82.1  PLT 484*   < > 411 560* 573* 614* 618*   < > = values in this interval not displayed.  Scheduled Meds: . amLODipine  10 mg Oral Daily  . Eslicarbazepine Acetate  1 tablet Oral TID  . pantoprazole (PROTONIX) IV  40 mg Intravenous QHS   Continuous Infusions: . sodium chloride Stopped (10/06/17 1400)  . piperacillin-tazobactam (ZOSYN)  IV Stopped (10/11/17 0440)    Assessment/Plan:  1. Acute on chronic blood loss anemia.  Iron deficiency anemia with iron studies.  Patient will need GI workup as outpatient.  Responded good to blood transfusion.  Hemoglobin stable 2. Weakness.  Physical therapy consultation appreciated.  Home health set up physical therapy and aid 3. Essential hypertension on amlodipine.  Continue Norvasc 10 mg daily. 4. Acute diverticulitis with small perforation.  Surgery following and on  empiric antibiotic Zosyn.  Discharged home by surgery on Cipro and Flagyl.  Surgical follow-up as outpatient 5. Chronic pain follows with pain management as outpatient  Code Status:     Code Status Orders  (From admission, onward)        Start     Ordered   10/05/17 2136  Full code  Continuous     10/05/17 2135    Code Status History    Date Active Date Inactive Code Status Order ID Comments User Context   This patient has a current code status but no historical code status.      Disposition Plan: As per surgery  Consultants:  General surgery  Gastroenterology  Antibiotics:  Zosyn  Time spent: 20 minutes  [place]

## 2017-10-11 NOTE — Progress Notes (Signed)
CC: Diverticulitis Subjective: Patient has no pain today.  She is still having some loose stools.  No nausea or vomiting no fevers or chills and she is tolerating a regular diet  Objective: Vital signs in last 24 hours: Temp:  [97.6 F (36.4 C)-98.2 F (36.8 C)] 98.1 F (36.7 C) (12/12 0420) Pulse Rate:  [75-97] 87 (12/12 0420) Resp:  [19] 19 (12/12 0420) BP: (128-138)/(53-61) 128/61 (12/12 0420) SpO2:  [95 %] 95 % (12/12 0420) Last BM Date: 10/10/17  Intake/Output from previous day: 12/11 0701 - 12/12 0700 In: 974 [P.O.:720; IV Piggyback:254] Out: 850 [Urine:850] Intake/Output this shift: No intake/output data recorded.  Physical exam:  Vital signs stable and reviewed abdomen is soft nontender no peritoneal signs no mass calves are nontender but no icterus no jaundice  Lab Results: CBC  Recent Labs    10/10/17 0420 10/11/17 0435  WBC 14.5* 9.7  HGB 9.3* 9.3*  HCT 29.0* 28.8*  PLT 614* 618*   BMET No results for input(s): NA, K, CL, CO2, GLUCOSE, BUN, CREATININE, CALCIUM in the last 72 hours. PT/INR No results for input(s): LABPROT, INR in the last 72 hours. ABG No results for input(s): PHART, HCO3 in the last 72 hours.  Invalid input(s): PCO2, PO2  Studies/Results: No results found.  Anti-infectives: Anti-infectives (From admission, onward)   Start     Dose/Rate Route Frequency Ordered Stop   10/06/17 0100  piperacillin-tazobactam (ZOSYN) IVPB 3.375 g     3.375 g 12.5 mL/hr over 240 Minutes Intravenous Every 8 hours 10/05/17 2135     10/05/17 1915  piperacillin-tazobactam (ZOSYN) IVPB 3.375 g     3.375 g 100 mL/hr over 30 Minutes Intravenous  Once 10/05/17 1906 10/05/17 1943   10/05/17 1900  metroNIDAZOLE (FLAGYL) IVPB 500 mg  Status:  Discontinued     500 mg 100 mL/hr over 60 Minutes Intravenous  Once 10/05/17 1857 10/05/17 1906   10/05/17 1900  ciprofloxacin (CIPRO) IVPB 400 mg  Status:  Discontinued     400 mg 200 mL/hr over 60 Minutes Intravenous   Once 10/05/17 1857 10/05/17 1906      Assessment/Plan:  Normalized white blood cell count with stable H&H.  Patient doing very well at this point tolerating a diet will be able to discharge her later today on oral antibiotics.  Florene Glen, MD, FACS  10/11/2017

## 2017-10-11 NOTE — Care Management Important Message (Signed)
Important Message  Patient Details  Name: Denise Barry MRN: [id number] Date of Birth: 1933-11-22   Medicare Important Message Given:  Yes    Beverly Sessions, RN 10/11/2017, 2:33 PM

## 2017-10-11 NOTE — Telephone Encounter (Signed)
Patient was hospitalized with severe diverticulitis. Still in hospital and wanted to let dr Dossie Arbour know so he could look at records. Has appt 10-18-17 for eval

## 2017-10-11 NOTE — Progress Notes (Signed)
Patient is ready for discharge but is nervous about going home and wants to go to the [facility] home.  Will ask nursing and social services to investigate this as a possibility.  Discussed with her the fact that she is not postoperative and does not need rehabilitation services that placement may not be possible.

## 2017-10-11 NOTE — Care Management (Signed)
Patient admitted for anemia.  Patient lives at home with son.  PCP Nicki Reaper,  Pharmacy [facility].  Patient has been evaluated by PT and recommends home health PT.  Patient states that she has a RW, cane, and elevated toilet seat.  Patient in agreement to home health.  Patient states that she has sued [facility] home health previously, and wants to use them again.  Referral made to [facility] with [facility].  RNCM signing off.

## 2017-10-11 NOTE — Discharge Instructions (Signed)
Soft diet Resume normal activities Resume all home medications Take oral antibiotics as ordered Follow-up with Dr. Dahlia Byes in 2 weeks Follow-up with primary care physician in 2 weeks Follow-up with GI consultation as outpatient.

## 2017-10-11 NOTE — Telephone Encounter (Signed)
Will forward to Dr. Dossie Arbour

## 2017-10-11 NOTE — Progress Notes (Signed)
Patient d/c home with home health. Son at bedside.Reviewed discharge instructions, prescriptions, and follow up appts with patient. Patient medication returned to patient from pharmacy. IV removed without complications.

## 2017-10-11 NOTE — Discharge Summary (Signed)
Physician Discharge Summary  Patient ID: Denise Barry MRN: [id number] DOB/AGE: 1934-04-05 83 y.o.  Admit date: 10/05/2017 Discharge date: 10/11/2017   Discharge Diagnoses:  Active Problems:   Anemia   Diverticulitis of large intestine with perforation   Diverticulitis   Procedures: None  Hospital Course: This a patient admitted to the hospital with acute diverticulitis.  She was treated on IV antibiotics and developed a falling hemoglobin and hematocrit for which she was transfused 1 unit of blood.  Her source of bleeding was unclear and due to her microperforation of the diverticular area of her colon it was decided not to request GI consultation as colonoscopy would not be possible.  Her H&H stabilized and never fell again.  Her white blood cell count has normalized.  She is tolerating a soft diet.  She will is been restarted on her home medications.  She is discharged in stable condition on a soft diet to resume all of her home medications to follow-up with her primary care physician in 2 weeks to follow-up with Dr. Clydene Laming him in 2 weeks.  She will also have a GI consultation arranged as an outpatient.  She will require a colonoscopy to assess for the cause of her fall in hemoglobin which has since stabilized.  She will be sent home on oral antibiotics.  She is instructed to return to the emergency room or call the office should she worsen at any time but her pain is completely gone at this point and her exam shows no sign of abdominal tenderness  Consults: Internal medicine  Disposition: 01-Home or Self Care   Allergies as of 10/11/2017      Reactions   Codeine Nausea Only   GI upset   Cyclobenzaprine    Other reaction(s): Headache   Methocarbamol    Other reaction(s): Other (See Comments)   Carbamazepine Anxiety   Gabapentin Anxiety      Medication List    TAKE these medications   amLODipine 10 MG tablet Commonly known as:  NORVASC Take 1 tablet (10 mg total) by  mouth daily.   aspirin 81 MG tablet Take 81 mg by mouth daily.   baclofen 10 MG tablet Commonly known as:  LIORESAL Take 1 tablet (10 mg total) 3 (three) times daily by mouth.   bisacodyl 5 MG EC tablet Commonly known as:  DULCOLAX Take 5 mg by mouth as needed.   ciprofloxacin 500 MG tablet Commonly known as:  CIPRO Take 1 tablet (500 mg total) by mouth 2 (two) times daily.   Eslicarbazepine Acetate 400 MG Tabs Take 1 tablet by mouth 3 (three) times daily.   ESTRACE VAGINAL 0.1 MG/GM vaginal cream Generic drug:  estradiol Apply PRN   fexofenadine 180 MG tablet Commonly known as:  ALLEGRA Take 180 mg by mouth as needed for allergies or rhinitis.   Magnesium Oxide 500 MG Caps Take 1 capsule (500 mg total) at bedtime by mouth.   metroNIDAZOLE 500 MG tablet Commonly known as:  FLAGYL Take 1 tablet (500 mg total) by mouth 3 (three) times daily.   MULTI-VITAMINS Tabs Take 1 tablet by mouth.   oxyCODONE 5 MG immediate release tablet Commonly known as:  Oxy IR/ROXICODONE Take 1 tablet (5 mg total) by mouth every 6 (six) hours as needed for moderate pain.   tiZANidine 4 MG tablet Commonly known as:  ZANAFLEX Take 1 tablet (4 mg total) 3 (three) times daily as needed by mouth for muscle spasms.   traMADol 50 MG  tablet Commonly known as:  ULTRAM Take 1 tablet (50 mg total) by mouth 2 (two) times daily as needed for moderate pain.   TYLENOL EXTRA STRENGTH PO Take 1-2 tablets by mouth daily.      Follow-up Information    Clayburn Pert, MD Follow up in 2 week(s).   Specialty:  General Surgery Contact information: [address] [place] [address] [phone]           Florene Glen, MD, FACS

## 2017-10-12 NOTE — Telephone Encounter (Signed)
Need appointment date and time to place patient HFU

## 2017-10-12 NOTE — Telephone Encounter (Signed)
Transition Care Management Follow-up Telephone Call  How have you been since you were released from the hospital? Patient feeling tired still and looking forward to having PT come into the home , hospital assigned amedysis    Do you understand why you were in the hospital? yes   Do you understand the discharge instrcutions? yes  Items Reviewed:  Medications reviewed: yes Zanaflex stopped in hospital per patient but this is not in DC summary.  Allergies reviewed: yes  Dietary changes reviewed: yes  Referrals reviewed: yes   Functional Questionnaire:   Activities of Daily Living (ADLs):   She states they are independent in the following: ambulation, feeding, continence, grooming and toileting  States they require assistance with the following: bathing and hygiene   Any transportation issues/concerns?: no   Any patient concerns? no   Confirmed importance and date/time of follow-up visits scheduled: yes   Confirmed with patient if condition begins to worsen call PCP or go to the ER.  Patient was given the Call-a-Nurse line [phone]: yes

## 2017-10-12 NOTE — Telephone Encounter (Signed)
Needs hospital follow up.  This is the one I was talking to you about - putting in 3:30 on 10/19/17 - if open.

## 2017-10-13 DIAGNOSIS — I10 Essential (primary) hypertension: Secondary | ICD-10-CM | POA: Diagnosis not present

## 2017-10-13 DIAGNOSIS — M545 Low back pain: Secondary | ICD-10-CM | POA: Diagnosis not present

## 2017-10-13 DIAGNOSIS — D509 Iron deficiency anemia, unspecified: Secondary | ICD-10-CM | POA: Diagnosis not present

## 2017-10-13 DIAGNOSIS — G500 Trigeminal neuralgia: Secondary | ICD-10-CM | POA: Diagnosis not present

## 2017-10-13 NOTE — Telephone Encounter (Signed)
Pt scheduled for 10/20/17 @12 :30 If doesn't work let me know  Thanks

## 2017-10-16 ENCOUNTER — Telehealth: Payer: Self-pay | Admitting: Pain Medicine

## 2017-10-16 ENCOUNTER — Ambulatory Visit: Payer: Medicare Other

## 2017-10-16 DIAGNOSIS — I10 Essential (primary) hypertension: Secondary | ICD-10-CM | POA: Diagnosis not present

## 2017-10-16 DIAGNOSIS — G500 Trigeminal neuralgia: Secondary | ICD-10-CM | POA: Diagnosis not present

## 2017-10-16 DIAGNOSIS — D509 Iron deficiency anemia, unspecified: Secondary | ICD-10-CM | POA: Diagnosis not present

## 2017-10-16 DIAGNOSIS — M545 Low back pain: Secondary | ICD-10-CM | POA: Diagnosis not present

## 2017-10-16 NOTE — Telephone Encounter (Signed)
Spoke with patient and she will keep her appt on Wednesday but would like to come a little bit later as she has difficulty getting going in the morning.  I told her that I would send to the secretaries to see if we can appt later in the day.

## 2017-10-16 NOTE — Telephone Encounter (Signed)
Has been in hospital and would like to speak with someone regarding upcoming appt. She is sched on 12-19 for follow up.

## 2017-10-17 DIAGNOSIS — G500 Trigeminal neuralgia: Secondary | ICD-10-CM | POA: Diagnosis not present

## 2017-10-17 DIAGNOSIS — I10 Essential (primary) hypertension: Secondary | ICD-10-CM | POA: Diagnosis not present

## 2017-10-17 DIAGNOSIS — M545 Low back pain: Secondary | ICD-10-CM | POA: Diagnosis not present

## 2017-10-17 DIAGNOSIS — D509 Iron deficiency anemia, unspecified: Secondary | ICD-10-CM | POA: Diagnosis not present

## 2017-10-18 ENCOUNTER — Ambulatory Visit: Payer: Medicare Other | Attending: Pain Medicine | Admitting: Pain Medicine

## 2017-10-18 ENCOUNTER — Encounter: Payer: Self-pay | Admitting: Pain Medicine

## 2017-10-18 VITALS — BP 116/46 | Temp 97.5°F | Resp 16 | Ht 65.5 in | Wt 155.0 lb

## 2017-10-18 DIAGNOSIS — E7800 Pure hypercholesterolemia, unspecified: Secondary | ICD-10-CM | POA: Insufficient documentation

## 2017-10-18 DIAGNOSIS — K5732 Diverticulitis of large intestine without perforation or abscess without bleeding: Secondary | ICD-10-CM | POA: Diagnosis not present

## 2017-10-18 DIAGNOSIS — R32 Unspecified urinary incontinence: Secondary | ICD-10-CM | POA: Diagnosis not present

## 2017-10-18 DIAGNOSIS — D72829 Elevated white blood cell count, unspecified: Secondary | ICD-10-CM | POA: Insufficient documentation

## 2017-10-18 DIAGNOSIS — Z79891 Long term (current) use of opiate analgesic: Secondary | ICD-10-CM | POA: Diagnosis not present

## 2017-10-18 DIAGNOSIS — G8929 Other chronic pain: Secondary | ICD-10-CM

## 2017-10-18 DIAGNOSIS — I10 Essential (primary) hypertension: Secondary | ICD-10-CM | POA: Diagnosis not present

## 2017-10-18 DIAGNOSIS — Z7982 Long term (current) use of aspirin: Secondary | ICD-10-CM | POA: Insufficient documentation

## 2017-10-18 DIAGNOSIS — M545 Low back pain: Secondary | ICD-10-CM | POA: Diagnosis not present

## 2017-10-18 DIAGNOSIS — M5134 Other intervertebral disc degeneration, thoracic region: Secondary | ICD-10-CM | POA: Diagnosis not present

## 2017-10-18 DIAGNOSIS — D649 Anemia, unspecified: Secondary | ICD-10-CM | POA: Diagnosis not present

## 2017-10-18 DIAGNOSIS — M961 Postlaminectomy syndrome, not elsewhere classified: Secondary | ICD-10-CM | POA: Diagnosis not present

## 2017-10-18 DIAGNOSIS — M533 Sacrococcygeal disorders, not elsewhere classified: Secondary | ICD-10-CM

## 2017-10-18 DIAGNOSIS — M48061 Spinal stenosis, lumbar region without neurogenic claudication: Secondary | ICD-10-CM | POA: Insufficient documentation

## 2017-10-18 DIAGNOSIS — R1030 Lower abdominal pain, unspecified: Secondary | ICD-10-CM | POA: Insufficient documentation

## 2017-10-18 DIAGNOSIS — Z885 Allergy status to narcotic agent status: Secondary | ICD-10-CM | POA: Diagnosis not present

## 2017-10-18 DIAGNOSIS — M7918 Myalgia, other site: Secondary | ICD-10-CM | POA: Insufficient documentation

## 2017-10-18 DIAGNOSIS — E871 Hypo-osmolality and hyponatremia: Secondary | ICD-10-CM | POA: Insufficient documentation

## 2017-10-18 DIAGNOSIS — G500 Trigeminal neuralgia: Secondary | ICD-10-CM | POA: Diagnosis not present

## 2017-10-18 DIAGNOSIS — Z79899 Other long term (current) drug therapy: Secondary | ICD-10-CM | POA: Diagnosis not present

## 2017-10-18 DIAGNOSIS — M5136 Other intervertebral disc degeneration, lumbar region: Secondary | ICD-10-CM | POA: Insufficient documentation

## 2017-10-18 DIAGNOSIS — M47816 Spondylosis without myelopathy or radiculopathy, lumbar region: Secondary | ICD-10-CM | POA: Diagnosis not present

## 2017-10-18 DIAGNOSIS — G894 Chronic pain syndrome: Secondary | ICD-10-CM | POA: Insufficient documentation

## 2017-10-18 DIAGNOSIS — K5720 Diverticulitis of large intestine with perforation and abscess without bleeding: Secondary | ICD-10-CM | POA: Diagnosis not present

## 2017-10-18 DIAGNOSIS — Z87891 Personal history of nicotine dependence: Secondary | ICD-10-CM | POA: Insufficient documentation

## 2017-10-18 NOTE — Patient Instructions (Addendum)
____________________________________________________________________________________________  Pain Scale  Introduction: The pain score used by this practice is the Verbal Numerical Rating Scale (VNRS-11). This is an 11-point scale. It is for adults and children 10 years or older. There are significant differences in how the pain score is reported, used, and applied. Forget everything you learned in the past and learn this scoring system.  General Information: The scale should reflect your current level of pain. Unless you are specifically asked for the level of your worst pain, or your average pain. If you are asked for one of these two, then it should be understood that it is over the past 24 hours.  Basic Activities of Daily Living (ADL): Personal hygiene, dressing, eating, transferring, and using restroom.  Instructions: Most patients tend to report their level of pain as a combination of two factors, their physical pain and their psychosocial pain. This last one is also known as "suffering" and it is reflection of how physical pain affects you socially and psychologically. From now on, report them separately. From this point on, when asked to report your pain level, report only your physical pain. Use the following table for reference.  Pain Clinic Pain Levels (0-5/10)  Pain Level Score  Description  No Pain 0   Mild pain 1 Nagging, annoying, but does not interfere with basic activities of daily living (ADL). Patients are able to eat, bathe, get dressed, toileting (being able to get on and off the toilet and perform personal hygiene functions), transfer (move in and out of bed or a chair without assistance), and maintain continence (able to control bladder and bowel functions). Blood pressure and heart rate are unaffected. A normal heart rate for a healthy adult ranges from 60 to 100 bpm (beats per minute).   Mild to moderate pain 2 Noticeable and distracting. Impossible to hide from other  people. More frequent flare-ups. Still possible to adapt and function close to normal. It can be very annoying and may have occasional stronger flare-ups. With discipline, patients may get used to it and adapt.   Moderate pain 3 Interferes significantly with activities of daily living (ADL). It becomes difficult to feed, bathe, get dressed, get on and off the toilet or to perform personal hygiene functions. Difficult to get in and out of bed or a chair without assistance. Very distracting. With effort, it can be ignored when deeply involved in activities.   Moderately severe pain 4 Impossible to ignore for more than a few minutes. With effort, patients may still be able to manage work or participate in some social activities. Very difficult to concentrate. Signs of autonomic nervous system discharge are evident: dilated pupils (mydriasis); mild sweating (diaphoresis); sleep interference. Heart rate becomes elevated (>115 bpm). Diastolic blood pressure (lower number) rises above 100 mmHg. Patients find relief in laying down and not moving.   Severe pain 5 Intense and extremely unpleasant. Associated with frowning face and frequent crying. Pain overwhelms the senses.  Ability to do any activity or maintain social relationships becomes significantly limited. Conversation becomes difficult. Pacing back and forth is common, as getting into a comfortable position is nearly impossible. Pain wakes you up from deep sleep. Physical signs will be obvious: pupillary dilation; increased sweating; goosebumps; brisk reflexes; cold, clammy hands and feet; nausea, vomiting or dry heaves; loss of appetite; significant sleep disturbance with inability to fall asleep or to remain asleep. When persistent, significant weight loss is observed due to the complete loss of appetite and sleep deprivation.  Blood   pressure and heart rate becomes significantly elevated. Caution: If elevated blood pressure triggers a pounding headache  associated with blurred vision, then the patient should immediately seek attention at an urgent or emergency care unit, as these may be signs of an impending stroke.    Emergency Department Pain Levels (6-10/10)  Emergency Room Pain 6 Severely limiting. Requires emergency care and should not be seen or managed at an outpatient pain management facility. Communication becomes difficult and requires great effort. Assistance to reach the emergency department may be required. Facial flushing and profuse sweating along with potentially dangerous increases in heart rate and blood pressure will be evident.   Distressing pain 7 Self-care is very difficult. Assistance is required to transport, or use restroom. Assistance to reach the emergency department will be required. Tasks requiring coordination, such as bathing and getting dressed become very difficult.   Disabling pain 8 Self-care is no longer possible. At this level, pain is disabling. The individual is unable to do even the most "basic" activities such as walking, eating, bathing, dressing, transferring to a bed, or toileting. Fine motor skills are lost. It is difficult to think clearly.   Incapacitating pain 9 Pain becomes incapacitating. Thought processing is no longer possible. Difficult to remember your own name. Control of movement and coordination are lost.   The worst pain imaginable 10 At this level, most patients pass out from pain. When this level is reached, collapse of the autonomic nervous system occurs, leading to a sudden drop in blood pressure and heart rate. This in turn results in a temporary and dramatic drop in blood flow to the brain, leading to a loss of consciousness. Fainting is one of the body's self defense mechanisms. Passing out puts the brain in a calmed state and causes it to shut down for a while, in order to begin the healing process.    Summary: 1. Refer to this scale when providing [place] with your pain level. 2. Be  accurate and careful when reporting your pain level. This will help with your care. 3. Over-reporting your pain level will lead to loss of credibility. 4. Even a level of 1/10 means that there is pain and will be treated at our facility. 5. High, inaccurate reporting will be documented as "Symptom Exaggeration", leading to loss of credibility and suspicions of possible secondary gains such as obtaining more narcotics, or wanting to appear disabled, for fraudulent reasons. 6. Only pain levels of 5 or below will be seen at our facility. 7. Pain levels of 6 and above will be sent to the Emergency Department and the appointment cancelled. ____________________________________________________________________________________________   GENERAL RISKS AND COMPLICATIONS  What are the risk, side effects and possible complications? Generally speaking, most procedures are safe.  However, with any procedure there are risks, side effects, and the possibility of complications.  The risks and complications are dependent upon the sites that are lesioned, or the type of nerve block to be performed.  The closer the procedure is to the spine, the more serious the risks are.  Great care is taken when placing the radio frequency needles, block needles or lesioning probes, but sometimes complications can occur. 1. Infection: Any time there is an injection through the skin, there is a risk of infection.  This is why sterile conditions are used for these blocks.  There are four possible types of infection. 1. Localized skin infection. 2. Central Nervous System Infection-This can be in the form of Meningitis, which can be deadly.  3. Epidural Infections-This can be in the form of an epidural abscess, which can cause pressure inside of the spine, causing compression of the spinal cord with subsequent paralysis. This would require an emergency surgery to decompress, and there are no guarantees that the patient would recover from  the paralysis. 4. Discitis-This is an infection of the intervertebral discs.  It occurs in about 1% of discography procedures.  It is difficult to treat and it may lead to surgery.        2. Pain: the needles have to go through skin and soft tissues, will cause soreness.       3. Damage to internal structures:  The nerves to be lesioned may be near blood vessels or    other nerves which can be potentially damaged.       4. Bleeding: Bleeding is more common if the patient is taking blood thinners such as  aspirin, Coumadin, Ticiid, Plavix, etc., or if he/she have some genetic predisposition  such as hemophilia. Bleeding into the spinal canal can cause compression of the spinal  cord with subsequent paralysis.  This would require an emergency surgery to  decompress and there are no guarantees that the patient would recover from the  paralysis.       5. Pneumothorax:  Puncturing of a lung is a possibility, every time a needle is introduced in  the area of the chest or upper back.  Pneumothorax refers to free air around the  collapsed lung(s), inside of the thoracic cavity (chest cavity).  Another two possible  complications related to a similar event would include: Hemothorax and Chylothorax.   These are variations of the Pneumothorax, where instead of air around the collapsed  lung(s), you may have blood or chyle, respectively.       6. Spinal headaches: They may occur with any procedures in the area of the spine.       7. Persistent CSF (Cerebro-Spinal Fluid) leakage: This is a rare problem, but may occur  with prolonged intrathecal or epidural catheters either due to the formation of a fistulous  track or a dural tear.       8. Nerve damage: By working so close to the spinal cord, there is always a possibility of  nerve damage, which could be as serious as a permanent spinal cord injury with  paralysis.       9. Death:  Although rare, severe deadly allergic reactions known as "Anaphylactic  reaction" can  occur to any of the medications used.      10. Worsening of the symptoms:  We can always make thing worse.  What are the chances of something like this happening? Chances of any of this occuring are extremely low.  By statistics, you have more of a chance of getting killed in a motor vehicle accident: while driving to the hospital than any of the above occurring .  Nevertheless, you should be aware that they are possibilities.  In general, it is similar to taking a shower.  Everybody knows that you can slip, hit your head and get killed.  Does that mean that you should not shower again?  Nevertheless always keep in mind that statistics do not mean anything if you happen to be on the wrong side of them.  Even if a procedure has a 1 (one) in a 1,000,000 (million) chance of going wrong, it you happen to be that one..Also, keep in mind that by statistics, you have more of  a chance of having something go wrong when taking medications.  Who should not have this procedure? If you are on a blood thinning medication (e.g. Coumadin, Plavix, see list of "Blood Thinners"), or if you have an active infection going on, you should not have the procedure.  If you are taking any blood thinners, please inform your physician.  How should I prepare for this procedure?  Do not eat or drink anything at least six hours prior to the procedure.  Bring a driver with you .  It cannot be a taxi.  Come accompanied by an adult that can drive you back, and that is strong enough to help you if your legs get weak or numb from the local anesthetic.  Take all of your medicines the morning of the procedure with just enough water to swallow them.  If you have diabetes, make sure that you are scheduled to have your procedure done first thing in the morning, whenever possible.  If you have diabetes, take only half of your insulin dose and notify our nurse that you have done so as soon as you arrive at the clinic.  If you are  diabetic, but only take blood sugar pills (oral hypoglycemic), then do not take them on the morning of your procedure.  You may take them after you have had the procedure.  Do not take aspirin or any aspirin-containing medications, at least eleven (11) days prior to the procedure.  They may prolong bleeding.  Wear loose fitting clothing that may be easy to take off and that you would not mind if it got stained with Betadine or blood.  Do not wear any jewelry or perfume  Remove any nail coloring.  It will interfere with some of our monitoring equipment.  NOTE: Remember that this is not meant to be interpreted as a complete list of all possible complications.  Unforeseen problems may occur.  BLOOD THINNERS The following drugs contain aspirin or other products, which can cause increased bleeding during surgery and should not be taken for 2 weeks prior to and 1 week after surgery.  If you should need take something for relief of minor pain, you may take acetaminophen which is found in Tylenol,m Datril, Anacin-3 and Panadol. It is not blood thinner. The products listed below are.  Do not take any of the products listed below in addition to any listed on your instruction sheet.  A.P.C or A.P.C with Codeine Codeine Phosphate Capsules #3 Ibuprofen Ridaura  ABC compound Congesprin Imuran rimadil  Advil Cope Indocin Robaxisal  Alka-Seltzer Effervescent Pain Reliever and Antacid Coricidin or Coricidin-D  Indomethacin Rufen  Alka-Seltzer plus Cold Medicine Cosprin Ketoprofen S-A-C Tablets  Anacin Analgesic Tablets or Capsules Coumadin Korlgesic Salflex  Anacin Extra Strength Analgesic tablets or capsules CP-2 Tablets Lanoril Salicylate  Anaprox Cuprimine Capsules Levenox Salocol  Anexsia-D Dalteparin Magan Salsalate  Anodynos Darvon compound Magnesium Salicylate Sine-off  Ansaid Dasin Capsules Magsal Sodium Salicylate  Anturane Depen Capsules Marnal Soma  APF Arthritis pain formula Dewitt's Pills  Measurin Stanback  Argesic Dia-Gesic Meclofenamic Sulfinpyrazone  Arthritis Bayer Timed Release Aspirin Diclofenac Meclomen Sulindac  Arthritis pain formula Anacin Dicumarol Medipren Supac  Analgesic (Safety coated) Arthralgen Diffunasal Mefanamic Suprofen  Arthritis Strength Bufferin Dihydrocodeine Mepro Compound Suprol  Arthropan liquid Dopirydamole Methcarbomol with Aspirin Synalgos  ASA tablets/Enseals Disalcid Micrainin Tagament  Ascriptin Doan's Midol Talwin  Ascriptin A/D Dolene Mobidin Tanderil  Ascriptin Extra Strength Dolobid Moblgesic Ticlid  Ascriptin with Codeine Doloprin or Doloprin with  Codeine Momentum Tolectin  Asperbuf Duoprin Mono-gesic Trendar  Aspergum Duradyne Motrin or Motrin IB Triminicin  Aspirin plain, buffered or enteric coated Durasal Myochrisine Trigesic  Aspirin Suppositories Easprin Nalfon Trillsate  Aspirin with Codeine Ecotrin Regular or Extra Strength Naprosyn Uracel  Atromid-S Efficin Naproxen Ursinus  Auranofin Capsules Elmiron Neocylate Vanquish  Axotal Emagrin Norgesic Verin  Azathioprine Empirin or Empirin with Codeine Normiflo Vitamin E  Azolid Emprazil Nuprin Voltaren  Bayer Aspirin plain, buffered or children's or timed BC Tablets or powders Encaprin Orgaran Warfarin Sodium  Buff-a-Comp Enoxaparin Orudis Zorpin  Buff-a-Comp with Codeine Equegesic Os-Cal-Gesic   Buffaprin Excedrin plain, buffered or Extra Strength Oxalid   Bufferin Arthritis Strength Feldene Oxphenbutazone   Bufferin plain or Extra Strength Feldene Capsules Oxycodone with Aspirin   Bufferin with Codeine Fenoprofen Fenoprofen Pabalate or Pabalate-SF   Buffets II Flogesic Panagesic   Buffinol plain or Extra Strength Florinal or Florinal with Codeine Panwarfarin   Buf-Tabs Flurbiprofen Penicillamine   Butalbital Compound Four-way cold tablets Penicillin   Butazolidin Fragmin Pepto-Bismol   Carbenicillin Geminisyn Percodan   Carna Arthritis Reliever Geopen Persantine    Carprofen Gold's salt Persistin   Chloramphenicol Goody's Phenylbutazone   Chloromycetin Haltrain Piroxlcam   Clmetidine heparin Plaquenil   Cllnoril Hyco-pap Ponstel   Clofibrate Hydroxy chloroquine Propoxyphen         Before stopping any of these medications, be sure to consult the physician who ordered them.  Some, such as Coumadin (Warfarin) are ordered to prevent or treat serious conditions such as "deep thrombosis", "pumonary embolisms", and other heart problems.  The amount of time that you may need off of the medication may also vary with the medication and the reason for which you were taking it.  If you are taking any of these medications, please make sure you notify your pain physician before you undergo any procedures.         Sacroiliac (SI) Joint Injection Patient Information  Description: The sacroiliac joint connects the scrum (very low back and tailbone) to the ilium (a pelvic bone which also forms half of the hip joint).  Normally this joint experiences very little motion.  When this joint becomes inflamed or unstable low back and or hip and pelvis pain may result.  Injection of this joint with local anesthetics (numbing medicines) and steroids can provide diagnostic information and reduce pain.  This injection is performed with the aid of x-ray guidance into the tailbone area while you are lying on your stomach.   You may experience an electrical sensation down the leg while this is being done.  You may also experience numbness.  We also may ask if we are reproducing your normal pain during the injection.  Conditions which may be treated SI injection:   Low back, buttock, hip or leg pain  Preparation for the Injection:  1. Do not eat any solid food or dairy products within 8 hours of your appointment.  2. You may drink clear liquids up to 3 hours before appointment.  Clear liquids include water, black coffee, juice or soda.  No milk or cream please. 3. You may  take your regular medications, including pain medications with a sip of water before your appointment.  Diabetics should hold regular insulin (if take separately) and take 1/2 normal NPH dose the morning of the procedure.  Carry some sugar containing items with you to your appointment. 4. A driver must accompany you and be prepared to drive you home after your procedure. 5.  Bring all of your current medications with you. 6. An IV may be inserted and sedation may be given at the discretion of the physician. 7. A blood pressure cuff, EKG and other monitors will often be applied during the procedure.  Some patients may need to have extra oxygen administered for a short period.  8. You will be asked to provide medical information, including your allergies, prior to the procedure.  We must know immediately if you are taking blood thinners (like Coumadin/Warfarin) or if you are allergic to IV iodine contrast (dye).  We must know if you could possible be pregnant.  Possible side effects:   Bleeding from needle site  Infection (rare, may require surgery)  Nerve injury (rare)  Numbness & tingling (temporary)  A brief convulsion or seizure  Light-headedness (temporary)  Pain at injection site (several days)  Decreased blood pressure (temporary)  Weakness in the leg (temporary)   Call if you experience:   New onset weakness or numbness of an extremity below the injection site that last more than 8 hours.  Hives or difficulty breathing ( go to the emergency room)  Inflammation or drainage at the injection site  Any new symptoms which are concerning to you  Please note:  Although the local anesthetic injected can often make your back/ hip/ buttock/ leg feel good for several hours after the injections, the pain will likely return.  It takes 3-7 days for steroids to work in the sacroiliac area.  You may not notice any pain relief for at least that one week.  If effective, we will often  do a series of three injections spaced 3-6 weeks apart to maximally decrease your pain.  After the initial series, we generally will wait some months before a repeat injection of the same type.  If you have any questions, please call [phone] [facility]   Facet Joint Block The facet joints connect the bones of the spine (vertebrae). They make it possible for you to bend, twist, and make other movements with your spine. They also keep you from bending too far, twisting too far, and making other excessive movements. A facet joint block is a procedure where a numbing medicine (anesthetic) is injected into a facet joint. Often, a type of anti-inflammatory medicine called a steroid is also injected. A facet joint block may be done to diagnose neck or back pain. If the pain gets better after a facet joint block, it means the pain is probably coming from the facet joint. If the pain does not get better, it means the pain is probably not coming from the facet joint. A facet joint block may also be done to relieve neck or back pain caused by an inflamed facet joint. A facet joint block is only done to relieve pain if the pain does not improve with other methods, such as medicine, exercise programs, and physical therapy. Tell a health care provider about:  Any allergies you have.  All medicines you are taking, including vitamins, herbs, eye drops, creams, and over-the-counter medicines.  Any problems you or family members have had with anesthetic medicines.  Any blood disorders you have.  Any surgeries you have had.  Any medical conditions you have.  Whether you are pregnant or may be pregnant. What are the risks? Generally, this is a safe procedure. However, problems may occur, including:  Bleeding.  Injury to a nerve near the injection site.  Pain at the injection site.  Weakness or numbness in areas controlled by nerves near the injection  site.  Infection.  Temporary fluid retention.  Allergic reactions to medicines or dyes.  Injury to other structures or organs near the injection site.  What happens before the procedure?  Follow instructions from your health care provider about eating or drinking restrictions.  Ask your health care provider about: ? Changing or stopping your regular medicines. This is especially important if you are taking diabetes medicines or blood thinners. ? Taking medicines such as aspirin and ibuprofen. These medicines can thin your blood. Do not take these medicines before your procedure if your health care provider instructs you not to.  Do not take any new dietary supplements or medicines without asking your health care provider first.  Plan to have someone take you home after the procedure. What happens during the procedure?  You may need to remove your clothing and dress in an open-back gown.  The procedure will be done while you are lying on an X-ray table. You will most likely be asked to lie on your stomach, but you may be asked to lie in a different position if an injection will be made in your neck.  Machines will be used to monitor your oxygen levels, heart rate, and blood pressure.  If an injection will be made in your neck, an IV tube will be inserted into one of your veins. Fluids and medicine will flow directly into your body through the IV tube.  The area over the facet joint where the injection will be made will be cleaned with soap. The surrounding skin will be covered with clean drapes.  A numbing medicine (local anesthetic) will be applied to your skin. Your skin may sting or burn for a moment.  A video X-ray machine (fluoroscopy) will be used to locate the joint. In some cases, a CT scan may be used.  A contrast dye may be injected into the facet joint area to help locate the joint.  When the joint is located, an anesthetic will be injected into the joint through the  needle.  Your health care provider will ask you whether you feel pain relief. If you do feel relief, a steroid may be injected to provide pain relief for a longer period of time. If you do not feel relief or feel only partial relief, additional injections of an anesthetic may be made in other facet joints.  The needle will be removed.  Your skin will be cleaned.  A bandage (dressing) will be applied over each injection site. The procedure may vary among health care providers and hospitals. What happens after the procedure?  You will be observed for 15-30 minutes before being allowed to go home. This information is not intended to replace advice given to you by your health care provider. Make sure you discuss any questions you have with your health care provider. Document Released: 03/08/2007 Document Revised: 11/18/2015 Document Reviewed: 07/13/2015 [employer] Interactive Patient Education  2018 [place]. Pain Management Discharge Instructions  General Discharge Instructions :  If you need to reach your doctor call: Monday-Friday 8:00 am - 4:00 pm at [phone] or toll free [phone].  After clinic hours [phone] to have operator reach doctor.  Bring all of your medication bottles to all your appointments in the pain clinic.  To cancel or reschedule your appointment with Pain Management please remember to call 24 hours in advance to avoid a fee.  Refer to the educational materials which you have  been given on: General Risks, I had my Procedure. Discharge Instructions, Post Sedation.

## 2017-10-18 NOTE — Progress Notes (Signed)
Patient's Name: Denise Barry  MRN: [id number]  Referring Provider: Einar Pheasant, MD  DOB: [birth year]  PCP: Einar Pheasant, MD  DOS: 10/18/2017  Note by: Gaspar Cola, MD  Service setting: Ambulatory outpatient  Specialty: Interventional Pain Management  Location: [facility] (AMB) Pain Management Facility    Patient type: Established   Primary Reason(s) for Visit: Encounter for post-procedure evaluation of chronic illness with mild to moderate exacerbation CC: Back Pain (lower)  HPI  Denise Barry is a 83 y.o. year old, female patient, who comes today for a post-procedure evaluation. 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); Urinary incontinence; History of hip fracture (Left); Right leg swelling; Health care maintenance; Hyponatremia; History of shoulder fracture (Left); Syncope; Chronic low back pain (Primary Area of Pain)  (Bilateral) (L>R); Chronic pain syndrome; Opiate use; Other long term (current) drug therapy; Other specified health status; Other reduced mobility; Disorder of bone, unspecified; Acquired spondylolisthesis; Closed 2-part displaced fracture of surgical neck of left humerus; DDD (degenerative disc disease), lumbar; Pyuria; Spinal stenosis, unspecified region other than cervical; SUNCT (short unilateral neuralgiform headache, conjunctival inj/tear); Anemia; Old Compression fracture of L1 and L2; DDD (degenerative disc disease), thoracic; Scoliosis; Abnormal MRI, lumbar spine (07/25/2017); Chronic musculoskeletal pain; Spasm of muscle of lower back (B); Lumbar Spondylosis; Lumbar facet syndrome (Bilateral) (L>R); Chronic sacroiliac joint pain (Bilateral) (L>R); Grade 1 Anterolisthesis of L4 over L5; Grade 1 Retrolisthesis of L3 over L4; Abdominal pain; Elevated WBC count; Granulocytosis; Diverticulitis of large intestine with perforation; and Diverticulitis on their problem  list. Her primarily concern today is the Back Pain (lower)  Pain Assessment: Location: Mid, Lower Back Radiating: na Onset: More than a month ago Duration: Chronic pain Quality: (Severe, piercing) Severity: 8 /10 (self-reported pain score)  Note: Reported level is inconsistent with clinical observations. Clinically the patient looks like a 4/10 A 4/10 is viewed as "Moderately Severe" and described as impossible to ignore for more than a few minutes. With effort, patients may still be able to manage work or participate in some social activities. Very difficult to concentrate. Signs of autonomic nervous system discharge are evident: dilated pupils (mydriasis); mild sweating (diaphoresis); sleep interference. Heart rate becomes elevated (>115 bpm). Diastolic blood pressure (lower number) rises above 100 mmHg. Patients find relief in laying down and not moving. Denise Barry does not seem to understand the use of our objective pain scale When using our objective Pain Scale, levels between 6 and 10/10 are said to belong in an emergency room, as it progressively worsens from a 6/10, described as severely limiting, requiring emergency care not usually available at an outpatient pain management facility. At a 6/10 level, communication becomes difficult and requires great effort. Assistance to reach the emergency department may be required. Facial flushing and profuse sweating along with potentially dangerous increases in heart rate and blood pressure will be evident. Effect on ADL: walking, bending, moving Timing: Constant Modifying factors: rest  Denise Barry comes in today for post-procedure evaluation after the treatment done on 10/16/2017. Since the patient's last visit to our clinic, she was hospitalized due to diarrhea and abdominal pain which turned out to be a diverticulitis. For some reason, her hemoglobin and hematocrit are low and she is pending to see her primary care physician tomorrow. Because of  her anemia, she feels extremely weak and therefore we plan to hold until she gets all of her medical problems solved before we move on to continue with  the treatment of her chronic pain syndrome.  Further details on both, my assessment(s), as well as the proposed treatment plan, please see below.  Post-Procedure Assessment  10/16/2017 Procedure: Diagnostic bilateral lumbar facet block + diagnostic bilateral sacroiliac joint block #1 under fluoroscopic guidance and IV sedation Pre-procedure pain score:  4/10 Post-procedure pain score: 0/10 (100% relief) Influential Factors: BMI: 25.40 kg/m Intra-procedural challenges: None observed.         Assessment challenges: None detected.              Reported side-effects: None.        Post-procedural adverse reactions or complications: None reported         Sedation: Sedation provided. When no sedatives are used, the analgesic levels obtained are directly associated to the effectiveness of the local anesthetics. However, when sedation is provided, the level of analgesia obtained during the initial 1 hour following the intervention, is believed to be the result of a combination of factors. These factors may include, but are not limited to: 1. The effectiveness of the local anesthetics used. 2. The effects of the analgesic(s) and/or anxiolytic(s) used. 3. The degree of discomfort experienced by the patient at the time of the procedure. 4. The patients ability and reliability in recalling and recording the events. 5. The presence and influence of possible secondary gains and/or psychosocial factors. Reported result: Relief experienced during the 1st hour after the procedure: 100 % (Ultra-Short Term Relief) Denise Barry has indicated area to have been numb during this time. Interpretative annotation: Clinically appropriate result. Analgesia during this period is likely to be Local Anesthetic and/or IV Sedative (Analgesic/Anxiolytic) related.           Effects of local anesthetic: The analgesic effects attained during this period are directly associated to the localized infiltration of local anesthetics and therefore cary significant diagnostic value as to the etiological location, or anatomical origin, of the pain. Expected duration of relief is directly dependent on the pharmacodynamics of the local anesthetic used. Long-acting (4-6 hours) anesthetics used.  Reported result: Relief during the next 4 to 6 hour after the procedure: 50 % (Short-Term Relief)            Interpretative annotation: Clinically appropriate result. Analgesia during this period is likely to be Local Anesthetic-related.          Long-term benefit: Defined as the period of time past the expected duration of local anesthetics (1 hour for short-acting and 4-6 hours for long-acting). With the possible exception of prolonged sympathetic blockade from the local anesthetics, benefits during this period are typically attributed to, or associated with, other factors such as analgesic sensory neuropraxia, antiinflammatory effects, or beneficial biochemical changes provided by agents other than the local anesthetics.  Reported result: Extended relief following procedure: 0 %(2-3 days) (Long-Term Relief)            Interpretative annotation: Clinically appropriate result. Poor/inaccurate patient record keeping and/or recollection. No permanent benefit expected. Immediately after the patient's last procedure she started having problems with diarrhea which turned out to be a different diverticulitis. This problem skewed the results of her procedure and at this point I believe them to be unreliable.          Current benefits: Defined as reported results that persistent at this point in time.   Analgesia: 0-25 %            Function: Somewhat improved ROM: Somewhat improved Interpretative annotation: Recurrence of symptoms. Incomplete therapeutic success. The  patient medical problems seem to  have affected her recovery from the procedure and therefore the initial diagnostic injection was rather unreliable.          Interpretation: Results would suggest failure of therapy in achieving desired goal(s).                  Plan:  Repeat treatment or therapy and compare extent and duration of benefits.          Laboratory Chemistry  Inflammation Markers (CRP: Acute Phase) (ESR: Chronic Phase) Lab Results  Component Value Date   CRP 1.1 08/28/2017   ESRSEDRATE 5 08/28/2017   LATICACIDVEN 1.3 02/04/2015                 Rheumatology Markers No results found for: RF, ANA, LABURIC, URICUR, LYMEIGGIGMAB, LYMEABIGMQN              Renal Function Markers Lab Results  Component Value Date   BUN 28 (H) 10/06/2017   CREATININE 0.89 10/06/2017   GFRAA >60 10/06/2017   GFRNONAA 59 (L) 10/06/2017                 Hepatic Function Markers Lab Results  Component Value Date   AST 25 10/05/2017   ALT 45 10/05/2017   ALBUMIN 2.9 (L) 10/05/2017   ALKPHOS 152 (H) 10/05/2017   LIPASE 80 (H) 02/03/2015                 Electrolytes Lab Results  Component Value Date   NA 133 (L) 10/06/2017   K 3.9 10/06/2017   CL 102 10/06/2017   CALCIUM 8.1 (L) 10/06/2017   MG 2.2 10/06/2017   PHOS 4.1 10/06/2017                 Neuropathy Markers No results found for: VITAMINB12, FOLATE, HGBA1C, HIV               Bone Pathology Markers Lab Results  Component Value Date   25OHVITD1 47 08/28/2017   25OHVITD2 <1.0 08/28/2017   25OHVITD3 47 08/28/2017                 Coagulation Parameters Lab Results  Component Value Date   INR 1.0 02/03/2015   LABPROT 13.3 02/03/2015   APTT 26.6 01/12/2014   PLT 618 (H) 10/11/2017                 Cardiovascular Markers Lab Results  Component Value Date   TROPONINI <0.03 02/03/2015   HGB 9.3 (L) 10/11/2017   HCT 28.8 (L) 10/11/2017                 CA Markers No results found for: CEA, CA125, LABCA2               Note: Lab results  reviewed.  Recent Diagnostic Imaging Results  CT Abdomen Pelvis W Contrast CLINICAL DATA:  Lower abdominal pain with diarrhea  EXAM: CT ABDOMEN AND PELVIS WITH CONTRAST  TECHNIQUE: Multidetector CT imaging of the abdomen and pelvis was performed using the standard protocol following bolus administration of intravenous contrast.  CONTRAST:  166m ISOVUE-300 IOPAMIDOL (ISOVUE-300) INJECTION 61%  COMPARISON:  02/03/2015  FINDINGS: Lower chest: Lung bases demonstrate some minimal scarring bilaterally. A hiatal hernia is noted as well. These changes are stable from the prior exam.  Hepatobiliary: No focal liver abnormality is seen. No gallstones, gallbladder wall thickening, or biliary dilatation.  Pancreas: Unremarkable. No pancreatic ductal dilatation or surrounding  inflammatory changes.  Spleen: Normal in size without focal abnormality.  Adrenals/Urinary Tract: Stable left adrenal lesion is noted measuring approximately 1.4 cm consistent with an adenoma. The right adrenal is unremarkable. Renal cystic changes noted on the right. No renal calculi or obstructive changes are seen. The bladder is partially distended.  Stomach/Bowel: Fecal material is noted throughout the colon. The ascending, transverse and descending colon appear within normal limits. The sigmoid colon demonstrates a significant amount of inflammatory change with surrounding adenopathy increased from the prior exam. Scattered diverticular noted in this likely represents a component of rectosigmoid diverticulitis. Adjacent to the inflamed sigmoid there is an ovoid collection of air which measures approximately 2.7 cm in greatest dimension. This was not present on the prior exam and may represent an area of focal perforation. No focal fluid collection is identified to correspond with this abnormality. The appendix is not visualized.  Vascular/Lymphatic: Aortic atherosclerosis. Mesenteric lymph nodes are  identified adjacent to the inflamed sigmoid colon as previously described. The largest of these measures 10 mm in short axis. No other significant lymphadenopathy is noted.  Reproductive: A large pessary is again identified in place. The uterus is shrunken consistent with the patient's given age. No adnexal mass is seen.  Other: No abdominal wall hernia or abnormality. No abdominopelvic ascites.  Musculoskeletal: Postsurgical changes of the lumbar spine and left hip are seen. No acute bony abnormality is noted.  IMPRESSION: Diverticulosis of the colon with evidence of significant wall thickening and pericolonic inflammatory change in the region of the sigmoid colon extending into the rectum consistent with diverticulitis. A small focus of air is noted adjacent to the sigmoid consistent with a small perforation. No associated fluid component is noted. No significant free air is identified. Some associated reactive lymph nodes are noted in the mesentery.  Pessary in place.  Stable left adrenal adenoma.  Chronic changes as described.  Electronically Signed   By: Inez Catalina M.D.   On: 10/05/2017 18:45  Complexity Note: Imaging results reviewed. Results shared with Ms. Josten, using [employer].                         Meds   Current Outpatient Medications:  .  Acetaminophen (TYLENOL EXTRA STRENGTH PO), Take 1-2 tablets by mouth daily., Disp: , Rfl:  .  amLODipine (NORVASC) 10 MG tablet, Take 1 tablet (10 mg total) by mouth daily., Disp: 30 tablet, Rfl: 3 .  aspirin 81 MG tablet, Take 81 mg by mouth daily., Disp: , Rfl:  .  ciprofloxacin (CIPRO) 500 MG tablet, Take 1 tablet (500 mg total) by mouth 2 (two) times daily., Disp: 20 tablet, Rfl: 1 .  Eslicarbazepine Acetate 400 MG TABS, Take 1 tablet by mouth 3 (three) times daily., Disp: , Rfl:  .  estradiol (ESTRACE VAGINAL) 0.1 MG/GM vaginal cream, Apply PRN, Disp: , Rfl:  .  fexofenadine (ALLEGRA) 180 MG tablet, Take 180  mg by mouth as needed for allergies or rhinitis., Disp: , Rfl:  .  metroNIDAZOLE (FLAGYL) 500 MG tablet, Take 1 tablet (500 mg total) by mouth 3 (three) times daily., Disp: 30 tablet, Rfl: 1 .  Multiple Vitamin (MULTI-VITAMINS) TABS, Take 1 tablet by mouth. , Disp: , Rfl:  .  tiZANidine (ZANAFLEX) 4 MG tablet, Take 1 tablet (4 mg total) 3 (three) times daily as needed by mouth for muscle spasms., Disp: 90 tablet, Rfl: 0 .  traMADol (ULTRAM) 50 MG tablet, Take 1 tablet (  50 mg total) by mouth 2 (two) times daily as needed for moderate pain., Disp: 15 tablet, Rfl: 0  ROS  Constitutional: Denies any fever or chills Gastrointestinal: No reported hemesis, hematochezia, vomiting, or acute GI distress Musculoskeletal: Denies any acute onset joint swelling, redness, loss of ROM, or weakness Neurological: No reported episodes of acute onset apraxia, aphasia, dysarthria, agnosia, amnesia, paralysis, loss of coordination, or loss of consciousness  Allergies  Ms. Ficco is allergic to codeine; cyclobenzaprine; methocarbamol; carbamazepine; and gabapentin.  [place]  Drug: Ms. Rome  reports that she does not use drugs. Alcohol:  reports that she does not drink alcohol. Tobacco:  reports that she has quit smoking. she has never used smokeless tobacco. Medical:  has a past medical history of Allergy, Diverticulitis, Hypercholesterolemia, Hypertension, Osteoarthritis, Urinary incontinence, and Varicose veins. Surgical: Ms. Oelkers  has a past surgical history that includes Back surgery (09/16/13); sclerosis (2000); Dilation and curettage of uterus (1971); Tubal ligation (1972); and Joint replacement. Family: family history includes Diabetes in her mother; Heart disease in her father and mother; Hypertension in her mother; Stroke in her father.  Constitutional Exam  General appearance: alert, cooperative, slowed mentation, in mild distress and pale. The patient also indicates feeling very weak, probably to  her anemia. Vitals:   10/18/17 1405  BP: (!) 116/46  Resp: 16  Temp: (!) 97.5 F (36.4 C)  SpO2: 98%  Weight: 155 lb (70.3 kg)  Height: 5' 5.5" (1.664 m)   BMI Assessment: Estimated body mass index is 25.4 kg/m as calculated from the following:   Height as of this encounter: 5' 5.5" (1.664 m).   Weight as of this encounter: 155 lb (70.3 kg).  BMI interpretation table: BMI level Category Range association with higher incidence of chronic pain  <18 kg/m2 Underweight   18.5-24.9 kg/m2 Ideal body weight   25-29.9 kg/m2 Overweight Increased incidence by 20%  30-34.9 kg/m2 Obese (Class I) Increased incidence by 68%  35-39.9 kg/m2 Severe obesity (Class II) Increased incidence by 136%  >40 kg/m2 Extreme obesity (Class III) Increased incidence by 254%   BMI Readings from Last 4 Encounters:  10/19/17 25.70 kg/m  10/18/17 25.40 kg/m  10/05/17 27.46 kg/m  10/05/17 25.40 kg/m   Wt Readings from Last 4 Encounters:  10/19/17 156 lb 12.8 oz (71.1 kg)  10/18/17 155 lb (70.3 kg)  10/05/17 155 lb (70.3 kg)  10/05/17 155 lb (70.3 kg)  Psych/Mental status: Alert, oriented x 3 (person, place, & time)       Eyes: PERLA Respiratory: No evidence of acute respiratory distress  Cervical Spine Area Exam  Skin & Axial Inspection: No masses, redness, edema, swelling, or associated skin lesions Alignment: Symmetrical Functional ROM: Unrestricted ROM      Stability: No instability detected Muscle Tone/Strength: Functionally intact. No obvious neuro-muscular anomalies detected. Sensory (Neurological): Unimpaired Palpation: No palpable anomalies              Upper Extremity (UE) Exam    Side: Right upper extremity  Side: Left upper extremity  Skin & Extremity Inspection: Skin color, temperature, and hair growth are WNL. No peripheral edema or cyanosis. No masses, redness, swelling, asymmetry, or associated skin lesions. No contractures.  Skin & Extremity Inspection: Skin color, temperature, and  hair growth are WNL. No peripheral edema or cyanosis. No masses, redness, swelling, asymmetry, or associated skin lesions. No contractures.  Functional ROM: Unrestricted ROM          Functional ROM: Unrestricted ROM  Muscle Tone/Strength: Functionally intact. No obvious neuro-muscular anomalies detected.  Muscle Tone/Strength: Functionally intact. No obvious neuro-muscular anomalies detected.  Sensory (Neurological): Unimpaired          Sensory (Neurological): Unimpaired          Palpation: No palpable anomalies              Palpation: No palpable anomalies              Specialized Test(s): Deferred         Specialized Test(s): Deferred          Thoracic Spine Area Exam  Skin & Axial Inspection: No masses, redness, or swelling Alignment: Symmetrical Functional ROM: Unrestricted ROM Stability: No instability detected Muscle Tone/Strength: Functionally intact. No obvious neuro-muscular anomalies detected. Sensory (Neurological): Unimpaired Muscle strength & Tone: No palpable anomalies  Lumbar Spine Area Exam  Skin & Axial Inspection: No masses, redness, or swelling Alignment: Symmetrical Functional ROM: Unrestricted ROM      Stability: No instability detected Muscle Tone/Strength: Functionally intact. No obvious neuro-muscular anomalies detected. Sensory (Neurological): Unimpaired Palpation: No palpable anomalies       Provocative Tests: Lumbar Hyperextension and rotation test: evaluation deferred today       Lumbar Lateral bending test: evaluation deferred today       Patrick's Maneuver: evaluation deferred today                    Gait & Posture Assessment  Ambulation: Unassisted Gait: Relatively normal for age and body habitus Posture: WNL   Lower Extremity Exam    Side: Right lower extremity  Side: Left lower extremity  Skin & Extremity Inspection: Skin color, temperature, and hair growth are WNL. No peripheral edema or cyanosis. No masses, redness, swelling, asymmetry,  or associated skin lesions. No contractures.  Skin & Extremity Inspection: Skin color, temperature, and hair growth are WNL. No peripheral edema or cyanosis. No masses, redness, swelling, asymmetry, or associated skin lesions. No contractures.  Functional ROM: Unrestricted ROM          Functional ROM: Unrestricted ROM          Muscle Tone/Strength: Functionally intact. No obvious neuro-muscular anomalies detected.  Muscle Tone/Strength: Functionally intact. No obvious neuro-muscular anomalies detected.  Sensory (Neurological): Unimpaired  Sensory (Neurological): Unimpaired  Palpation: No palpable anomalies  Palpation: No palpable anomalies   Assessment  Primary Diagnosis & Pertinent Problem List: The primary encounter diagnosis was Chronic low back pain (Primary Area of Pain)  (Bilateral) (L>R). 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  Status Diagnosis  Controlled Controlled Controlled 1. Chronic low back pain (Primary Area of Pain)  (Bilateral) (L>R)   2. Lumbar facet syndrome (Bilateral) (L>R)   3. Chronic sacroiliac joint pain (Bilateral) (L>R)   4. 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  Medications administered today: Lanette Ell. Rodell had no medications administered during this visit.   Procedure Orders     LUMBAR FACET(MEDIAL BRANCH NERVE BLOCK) MBNB     SACROILIAC JOINT INJECTION Lab Orders  No laboratory test(s) ordered today   Imaging Orders  No imaging studies ordered today   Referral Orders  No referral(s) requested  today    Interventional management options: Planned, scheduled, and/or pending:   Diagnostic bilateral lumbar facet block + diagnostic bilateral sacroiliac joint block #2 under fluoroscopic guidance and IV sedation    Considering:   Diagnostic bilateral lumbar facet block  Possible bilateral lumbar facet RFA  Diagnostic bilateral sacroiliac joint block  Possible bilateral sacroiliac joint RFA  Diagnostic caudal epidural steroid injection plus diagnostic epidurogram  Possible Racz procedure  Diagnostic left sided L2-3 interlaminar lumbar epidural steroid injection    Palliative PRN treatment(s):   None at this time   Provider-requested follow-up: Return for Procedure (w/ sedation): (B) L-FCT + (B) SI BLK #2.  Future Appointments  Date Time Provider [place]  10/26/2017 10:45 AM Jules Husbands, MD [facility] None  11/01/2017  9:00 AM Lucilla Lame, MD [facility] None  11/03/2017  1:00 PM Dia Crawford, LPN [facility]  7/90/3833 10:15 AM Milinda Pointer, MD [facility] None  11/27/2017 12:00 PM Einar Pheasant, MD [facility]  01/15/2018  9:40 AM [facility] MM DEXA [facility]   Primary Care Physician: Einar Pheasant, MD Location: [facility] Facility Note by: Gaspar Cola, MD Date: 10/18/2017; Time: 8:19 PM

## 2017-10-19 ENCOUNTER — Ambulatory Visit (INDEPENDENT_AMBULATORY_CARE_PROVIDER_SITE_OTHER): Payer: Medicare Other | Admitting: Internal Medicine

## 2017-10-19 ENCOUNTER — Encounter: Payer: Self-pay | Admitting: Internal Medicine

## 2017-10-19 VITALS — BP 130/58 | HR 103 | Temp 98.2°F | Ht 65.5 in | Wt 156.8 lb

## 2017-10-19 DIAGNOSIS — M545 Low back pain: Secondary | ICD-10-CM | POA: Diagnosis not present

## 2017-10-19 DIAGNOSIS — G500 Trigeminal neuralgia: Secondary | ICD-10-CM

## 2017-10-19 DIAGNOSIS — G479 Sleep disorder, unspecified: Secondary | ICD-10-CM

## 2017-10-19 DIAGNOSIS — K5720 Diverticulitis of large intestine with perforation and abscess without bleeding: Secondary | ICD-10-CM | POA: Diagnosis not present

## 2017-10-19 DIAGNOSIS — D649 Anemia, unspecified: Secondary | ICD-10-CM

## 2017-10-19 DIAGNOSIS — D509 Iron deficiency anemia, unspecified: Secondary | ICD-10-CM | POA: Diagnosis not present

## 2017-10-19 DIAGNOSIS — I10 Essential (primary) hypertension: Secondary | ICD-10-CM | POA: Diagnosis not present

## 2017-10-19 NOTE — Patient Instructions (Signed)
Melatonin 3mg  - one tablet 3-4 hours before bed.

## 2017-10-19 NOTE — Progress Notes (Signed)
Patient ID: Denise Barry, female   DOB: [birth year], 83 y.o.   MRN: [id number]   Subjective:    Patient ID: Denise Barry, female    DOB: Apr 30, 1934, 83 y.o.   MRN: [id number]  HPI  Patient here for hospital follow up.  She is accompanied by her son.  History obtained from both of them.  Was admitted 10/05/17 with diverticulitis.  Treated with IV abx.  hgb decreased.  Was transfused 2 units of blood (per pt).  Since her discharge, she states she is feeling some better, but still weak.  PT coming to her house.  Eating.  No vomiting.  Bowels moving.  No blood in stool. She is having problems sleeping.  Discussed with her today.  Discussed different treatment options.  Discussed melatonin.     Past Medical History:  Diagnosis Date  . Allergy   . Diverticulitis    11/18  . Hypercholesterolemia   . Hypertension   . Osteoarthritis   . Urinary incontinence    pessary in place  . Varicose veins    H/O   Past Surgical History:  Procedure Laterality Date  . BACK SURGERY  09/16/13  . DILATION AND CURETTAGE OF UTERUS  1971  . JOINT REPLACEMENT    . sclerosis  2000  . TUBAL LIGATION  1972   Family History  Problem Relation Age of Onset  . Heart disease Mother   . Diabetes Mother   . Hypertension Mother   . Heart disease Father   . Stroke Father   . Breast cancer Neg Hx    Social History   Socioeconomic History  . Marital status: Widowed    Spouse name: None  . Number of children: None  . Years of education: None  . Highest education level: None  Social Needs  . Financial resource strain: None  . Food insecurity - worry: None  . Food insecurity - inability: None  . Transportation needs - medical: None  . Transportation needs - non-medical: None  Occupational History  . None  Tobacco Use  . Smoking status: Former Research scientist (life sciences)  . Smokeless tobacco: Never Used  Substance and Sexual Activity  . Alcohol use: No    Alcohol/week: 0.0 oz  . Drug use: No  . Sexual  activity: No  Other Topics Concern  . None  Social History Narrative  . None    Outpatient Encounter Medications as of 10/19/2017  Medication Sig  . Acetaminophen (TYLENOL EXTRA STRENGTH PO) Take 1-2 tablets by mouth daily.  Marland Kitchen amLODipine (NORVASC) 10 MG tablet Take 1 tablet (10 mg total) by mouth daily.  Marland Kitchen aspirin 81 MG tablet Take 81 mg by mouth daily.  . ciprofloxacin (CIPRO) 500 MG tablet Take 1 tablet (500 mg total) by mouth 2 (two) times daily.  . Eslicarbazepine Acetate 400 MG TABS Take 1 tablet by mouth 3 (three) times daily.  Marland Kitchen estradiol (ESTRACE VAGINAL) 0.1 MG/GM vaginal cream Apply PRN  . fexofenadine (ALLEGRA) 180 MG tablet Take 180 mg by mouth as needed for allergies or rhinitis.  Marland Kitchen metroNIDAZOLE (FLAGYL) 500 MG tablet Take 1 tablet (500 mg total) by mouth 3 (three) times daily.  . Multiple Vitamin (MULTI-VITAMINS) TABS Take 1 tablet by mouth.   Marland Kitchen tiZANidine (ZANAFLEX) 4 MG tablet Take 1 tablet (4 mg total) 3 (three) times daily as needed by mouth for muscle spasms.  . traMADol (ULTRAM) 50 MG tablet Take 1 tablet (50 mg total) by mouth 2 (two) times daily as  needed for moderate pain.  . [DISCONTINUED] baclofen (LIORESAL) 10 MG tablet Take 1 tablet (10 mg total) 3 (three) times daily by mouth. (Patient not taking: Reported on 10/05/2017)  . [DISCONTINUED] Magnesium Oxide 500 MG CAPS Take 1 capsule (500 mg total) at bedtime by mouth.   No facility-administered encounter medications on file as of 10/19/2017.     Review of Systems  Constitutional: Positive for fatigue. Negative for fever and unexpected weight change.  HENT: Negative for congestion and sinus pressure.   Respiratory: Negative for cough, chest tightness and shortness of breath.   Cardiovascular: Negative for chest pain, palpitations and leg swelling.  Gastrointestinal:       No abdominal pain.  Bowels moving.  Eating.  No vomiting.   Genitourinary: Negative for difficulty urinating and dysuria.    Musculoskeletal: Negative for joint swelling and myalgias.  Skin: Negative for color change and rash.  Neurological: Negative for dizziness, light-headedness and headaches.  Psychiatric/Behavioral: Negative for agitation and dysphoric mood.       Objective:     Blood pressure rechecked by me:  128/74, pulse 92  Physical Exam  Constitutional: She appears well-developed and well-nourished. No distress.  HENT:  Nose: Nose normal.  Mouth/Throat: Oropharynx is clear and moist.  Neck: Neck supple. No thyromegaly present.  Cardiovascular: Normal rate and regular rhythm.  Pulmonary/Chest: Breath sounds normal. No respiratory distress. She has no wheezes.  Abdominal: Soft. Bowel sounds are normal. There is no tenderness.  Musculoskeletal: She exhibits no edema or tenderness.  Lymphadenopathy:    She has no cervical adenopathy.  Skin: No rash noted. No erythema.  Psychiatric: She has a normal mood and affect. Her behavior is normal.    BP (!) 130/58 (BP Location: Right Arm, Patient Position: Sitting, Cuff Size: Normal) Comment: Irregular  Pulse (!) 103   Temp 98.2 F (36.8 C) (Oral)   Ht 5' 5.5" (1.664 m)   Wt 156 lb 12.8 oz (71.1 kg)   SpO2 96%   BMI 25.70 kg/m  Wt Readings from Last 3 Encounters:  10/19/17 156 lb 12.8 oz (71.1 kg)  10/18/17 155 lb (70.3 kg)  10/05/17 155 lb (70.3 kg)     Lab Results  Component Value Date   WBC 9.7 10/19/2017   HGB 9.6 (L) 10/19/2017   HCT 30.6 (L) 10/19/2017   PLT 690.0 (H) 10/19/2017   GLUCOSE 99 10/19/2017   CHOL 205 (H) 02/22/2017   TRIG 94.0 02/22/2017   HDL 71.30 02/22/2017   LDLCALC 115 (H) 02/22/2017   ALT 45 10/05/2017   AST 25 10/05/2017   NA 136 10/19/2017   K 4.6 10/19/2017   CL 101 10/19/2017   CREATININE 1.01 10/19/2017   BUN 22 10/19/2017   CO2 27 10/19/2017   TSH 2.21 07/05/2017   INR 1.0 02/03/2015    Ct Abdomen Pelvis W Contrast  Result Date: 10/05/2017 CLINICAL DATA:  Lower abdominal pain with diarrhea  EXAM: CT ABDOMEN AND PELVIS WITH CONTRAST TECHNIQUE: Multidetector CT imaging of the abdomen and pelvis was performed using the standard protocol following bolus administration of intravenous contrast. CONTRAST:  180mL ISOVUE-300 IOPAMIDOL (ISOVUE-300) INJECTION 61% COMPARISON:  02/03/2015 FINDINGS: Lower chest: Lung bases demonstrate some minimal scarring bilaterally. A hiatal hernia is noted as well. These changes are stable from the prior exam. Hepatobiliary: No focal liver abnormality is seen. No gallstones, gallbladder wall thickening, or biliary dilatation. Pancreas: Unremarkable. No pancreatic ductal dilatation or surrounding inflammatory changes. Spleen: Normal in size without focal abnormality.  Adrenals/Urinary Tract: Stable left adrenal lesion is noted measuring approximately 1.4 cm consistent with an adenoma. The right adrenal is unremarkable. Renal cystic changes noted on the right. No renal calculi or obstructive changes are seen. The bladder is partially distended. Stomach/Bowel: Fecal material is noted throughout the colon. The ascending, transverse and descending colon appear within normal limits. The sigmoid colon demonstrates a significant amount of inflammatory change with surrounding adenopathy increased from the prior exam. Scattered diverticular noted in this likely represents a component of rectosigmoid diverticulitis. Adjacent to the inflamed sigmoid there is an ovoid collection of air which measures approximately 2.7 cm in greatest dimension. This was not present on the prior exam and may represent an area of focal perforation. No focal fluid collection is identified to correspond with this abnormality. The appendix is not visualized. Vascular/Lymphatic: Aortic atherosclerosis. Mesenteric lymph nodes are identified adjacent to the inflamed sigmoid colon as previously described. The largest of these measures 10 mm in short axis. No other significant lymphadenopathy is noted. Reproductive: A  large pessary is again identified in place. The uterus is shrunken consistent with the patient's given age. No adnexal mass is seen. Other: No abdominal wall hernia or abnormality. No abdominopelvic ascites. Musculoskeletal: Postsurgical changes of the lumbar spine and left hip are seen. No acute bony abnormality is noted. IMPRESSION: Diverticulosis of the colon with evidence of significant wall thickening and pericolonic inflammatory change in the region of the sigmoid colon extending into the rectum consistent with diverticulitis. A small focus of air is noted adjacent to the sigmoid consistent with a small perforation. No associated fluid component is noted. No significant free air is identified. Some associated reactive lymph nodes are noted in the mesentery. Pessary in place. Stable left adrenal adenoma. Chronic changes as described. Electronically Signed   By: Inez Catalina M.D.   On: 10/05/2017 18:45       Assessment & Plan:   Problem List Items Addressed This Visit    Anemia - Primary    Unclear etiology.  Noticed worsening in hospital.  Being treated for diverticulitis.  S/p transfusion as outlined.  Recheck cbc and ferritin.  Keep appt with GI.        Relevant Orders   CBC with Differential/Platelet (Completed)   Ferritin (Completed)   Basic metabolic panel (Completed)   Chronic Trigeminal neuralgia (Right) (Chronic)    Followed by neurology.  On aptiom.        Difficulty sleeping    Discussed treatment options.  Start melatonin.  Follow.        Diverticulitis of large intestine with perforation    Recently admitted with diverticulitis with perforation.  Started on IV abx and now on oral abx.  Doing well with her abx.  Eating.  Bowels moving.  Plans to f/u with GI for question of need for colonoscopy (to evaluated anemia).  Take probiotics as directed.        Essential hypertension, benign    Blood pressure under good control.  Continue same medication regimen.  Follow pressures.   Follow metabolic panel.            Einar Pheasant, MD

## 2017-10-20 ENCOUNTER — Ambulatory Visit: Payer: Medicare Other | Admitting: Internal Medicine

## 2017-10-20 ENCOUNTER — Telehealth: Payer: Self-pay | Admitting: Internal Medicine

## 2017-10-20 LAB — BASIC METABOLIC PANEL
BUN: 22 mg/dL (ref 6–23)
CHLORIDE: 101 meq/L (ref 96–112)
CO2: 27 meq/L (ref 19–32)
CREATININE: 1.01 mg/dL (ref 0.40–1.20)
Calcium: 8.9 mg/dL (ref 8.4–10.5)
GFR: 55.86 mL/min — ABNORMAL LOW (ref >60.00)
Glucose, Bld: 99 mg/dL (ref 70–99)
Potassium: 4.6 mEq/L (ref 3.5–5.1)
Sodium: 136 mEq/L (ref 135–145)

## 2017-10-20 LAB — CBC WITH DIFFERENTIAL/PLATELET
Basophils Absolute: 0.1 10*3/uL (ref 0.0–0.1)
Basophils Relative: 1.3 % (ref 0.0–3.0)
EOS PCT: 1.2 % (ref 0.0–5.0)
Eosinophils Absolute: 0.1 10*3/uL (ref 0.0–0.7)
HCT: 30.6 % — ABNORMAL LOW (ref 36.0–46.0)
HEMOGLOBIN: 9.6 g/dL — AB (ref 12.0–15.0)
Lymphocytes Relative: 8.9 % — ABNORMAL LOW (ref 12.0–46.0)
Lymphs Abs: 0.9 10*3/uL (ref 0.7–4.0)
MCHC: 31.4 g/dL (ref 30.0–36.0)
MCV: 84.9 fl (ref 78.0–100.0)
MONOS PCT: 14.2 % — AB (ref 3.0–12.0)
Monocytes Absolute: 1.4 10*3/uL — ABNORMAL HIGH (ref 0.1–1.0)
Neutro Abs: 7.2 10*3/uL (ref 1.4–7.7)
Neutrophils Relative %: 74.4 % (ref 43.0–77.0)
Platelets: 690 10*3/uL — ABNORMAL HIGH (ref 150.0–400.0)
RBC: 3.6 Mil/uL — AB (ref 3.87–5.11)
RDW: 18.8 % — ABNORMAL HIGH (ref 11.5–15.5)
WBC: 9.7 10*3/uL (ref 4.0–10.5)

## 2017-10-20 LAB — FERRITIN: Ferritin: 14.3 ng/mL (ref 10.0–291.0)

## 2017-10-20 NOTE — Telephone Encounter (Signed)
Labs have not been reviewed by provider at this time. Patient will be contacted once results are available.

## 2017-10-20 NOTE — Telephone Encounter (Signed)
Do not ever forward anything to me. Come in and tell me about it, or call me immediately, if urgent. Do not ever assume I am constantly checking these electronic messages or that I even have access to a computer or other means to see these messages.

## 2017-10-20 NOTE — Telephone Encounter (Signed)
Copied from [place] [id number]. Topic: Quick Communication - See Telephone Encounter >> Oct 20, 2017  2:11 PM Ether Griffins B wrote: CRM for notification. See Telephone encounter for:  Pt wanting a call about her lab results  10/20/17.

## 2017-10-22 ENCOUNTER — Other Ambulatory Visit: Payer: Self-pay | Admitting: Internal Medicine

## 2017-10-22 ENCOUNTER — Encounter: Payer: Self-pay | Admitting: Internal Medicine

## 2017-10-22 DIAGNOSIS — G479 Sleep disorder, unspecified: Secondary | ICD-10-CM | POA: Insufficient documentation

## 2017-10-22 DIAGNOSIS — D649 Anemia, unspecified: Secondary | ICD-10-CM

## 2017-10-22 NOTE — Assessment & Plan Note (Signed)
Blood pressure under good control.  Continue same medication regimen.  Follow pressures.  Follow metabolic panel.   

## 2017-10-22 NOTE — Assessment & Plan Note (Signed)
Unclear etiology.  Noticed worsening in hospital.  Being treated for diverticulitis.  S/p transfusion as outlined.  Recheck cbc and ferritin.  Keep appt with GI.

## 2017-10-22 NOTE — Assessment & Plan Note (Signed)
Discussed treatment options.  Start melatonin.  Follow.  

## 2017-10-22 NOTE — Progress Notes (Signed)
Order placed for f/u labs.  

## 2017-10-22 NOTE — Assessment & Plan Note (Signed)
Followed by neurology.  On aptiom.

## 2017-10-22 NOTE — Assessment & Plan Note (Signed)
Recently admitted with diverticulitis with perforation.  Started on IV abx and now on oral abx.  Doing well with her abx.  Eating.  Bowels moving.  Plans to f/u with GI for question of need for colonoscopy (to evaluated anemia).  Take probiotics as directed.

## 2017-10-23 ENCOUNTER — Other Ambulatory Visit: Payer: Self-pay | Admitting: Surgery

## 2017-10-23 ENCOUNTER — Telehealth: Payer: Self-pay

## 2017-10-23 DIAGNOSIS — D509 Iron deficiency anemia, unspecified: Secondary | ICD-10-CM | POA: Diagnosis not present

## 2017-10-23 DIAGNOSIS — I10 Essential (primary) hypertension: Secondary | ICD-10-CM | POA: Diagnosis not present

## 2017-10-23 DIAGNOSIS — M545 Low back pain: Secondary | ICD-10-CM | POA: Diagnosis not present

## 2017-10-23 DIAGNOSIS — G500 Trigeminal neuralgia: Secondary | ICD-10-CM | POA: Diagnosis not present

## 2017-10-23 NOTE — Telephone Encounter (Signed)
Copied from [place] [phone]. Topic: Quick Communication - See Telephone Encounter >> Oct 20, 2017  2:11 PM Ether Griffins B wrote: CRM for notification. See Telephone encounter for:  Pt wanting a call about her lab results  10/20/17. >> Oct 23, 2017  8:23 AM Carolyn Stare wrote:   Pt call today to follow up on her lab results and would like a call back   [phone]

## 2017-10-23 NOTE — Telephone Encounter (Signed)
Patient was informed of results.  Patient understood and no questions, comments, or concerns at this time.  

## 2017-10-26 ENCOUNTER — Inpatient Hospital Stay: Payer: Self-pay | Admitting: Surgery

## 2017-10-26 ENCOUNTER — Telehealth: Payer: Self-pay | Admitting: Internal Medicine

## 2017-10-26 NOTE — Telephone Encounter (Signed)
What dosage of integra would patient need?Please advise

## 2017-10-26 NOTE — Telephone Encounter (Signed)
Copied from [place] [phone]. Topic: Quick Communication - See Telephone Encounter >> Oct 26, 2017 12:20 PM Vernona Rieger wrote: CRM for notification. See Telephone encounter for:   10/26/17.  Pt said Fransisco Beau told her that Dr Nicki Reaper was going to send over a script for IRON to [facility], she states it is not there.

## 2017-10-27 ENCOUNTER — Telehealth: Payer: Self-pay | Admitting: Internal Medicine

## 2017-10-27 DIAGNOSIS — M545 Low back pain: Secondary | ICD-10-CM | POA: Diagnosis not present

## 2017-10-27 DIAGNOSIS — I10 Essential (primary) hypertension: Secondary | ICD-10-CM | POA: Diagnosis not present

## 2017-10-27 DIAGNOSIS — G500 Trigeminal neuralgia: Secondary | ICD-10-CM | POA: Diagnosis not present

## 2017-10-27 DIAGNOSIS — D509 Iron deficiency anemia, unspecified: Secondary | ICD-10-CM | POA: Diagnosis not present

## 2017-10-27 MED ORDER — FERROUS SULFATE 325 (65 FE) MG PO TABS: 325.0000 mg | ORAL_TABLET | Freq: Every day | ORAL | 1 refills | Status: DC

## 2017-10-27 MED ORDER — INTEGRA 62.5-62.5-40-3 MG PO CAPS: ORAL_CAPSULE | ORAL | 1 refills | Status: DC

## 2017-10-27 NOTE — Telephone Encounter (Signed)
She could trial the OTC iron she has, though I also sent in a prescription for plain iron that she could take as well. Either way she should take them with vitamin C OTC to help with absorption. I will forward to Dr Nicki Reaper as well to inform her of cost concerns.

## 2017-10-27 NOTE — Telephone Encounter (Signed)
rx sent in for Fabens.  Take one per day.  Please notify pt and tell her sorry for the inconvenience.

## 2017-10-27 NOTE — Telephone Encounter (Signed)
Tried to reach patient by phone line busy no voicemail.

## 2017-10-27 NOTE — Addendum Note (Signed)
Addended by: Caryl Bis, Xiomar Crompton G on: 10/27/2017 12:13 PM   Modules accepted: Orders

## 2017-10-27 NOTE — Telephone Encounter (Signed)
Her pharmacy called her letting her know her insurance would not cover the iron Rx that Dr. Nicki Reaper prescribed.   It's $20/bottle.  She is wanting to know if it was ok to take what she has now which is an OTC Slow release iron supplement that  Is taken once a day.   Dose is 45mg . She would prefer to use this if it is ok with Dr. Nicki Reaper.  I routed a note to Dr. Bary Leriche nurse pool making them aware.

## 2017-10-29 NOTE — Telephone Encounter (Signed)
Ok to try otc iron.  See Dr Zyria Henri note.

## 2017-10-30 ENCOUNTER — Telehealth: Payer: Self-pay | Admitting: Internal Medicine

## 2017-10-30 NOTE — Telephone Encounter (Signed)
Copied from [place] [phone]. Topic: Inquiry >> Oct 30, 2017  8:46 AM Pricilla Handler wrote: Reason for CRM: Patient called requesting a refill of TraMADol (ULTRAM) 50 MG tablet. Patient stated that she needs this medication ASAP this morning as her pharmacy will be closing early. Patient's preferred pharmacy is [facility], [place] - [place] [phone] (Phone)  [phone] (Fax)

## 2017-10-30 NOTE — Telephone Encounter (Signed)
Copied from [place] [phone]. Topic: Inquiry >> Oct 30, 2017  8:46 AM Pricilla Handler wrote: Reason for CRM: Patient called requesting a refill of TraMADol (ULTRAM) 50 MG tablet. Patient stated that she needs this medication ASAP this morning as her pharmacy will be closing early. Patient's preferred pharmacy is [facility] [place] [facility] [place] [phone] (Phone)  [phone] (Fax)

## 2017-10-30 NOTE — Telephone Encounter (Signed)
Dr. Dossie Arbour writes this prescription

## 2017-10-31 DIAGNOSIS — I10 Essential (primary) hypertension: Secondary | ICD-10-CM | POA: Diagnosis not present

## 2017-10-31 DIAGNOSIS — G500 Trigeminal neuralgia: Secondary | ICD-10-CM | POA: Diagnosis not present

## 2017-10-31 DIAGNOSIS — M545 Low back pain: Secondary | ICD-10-CM | POA: Diagnosis not present

## 2017-10-31 DIAGNOSIS — D509 Iron deficiency anemia, unspecified: Secondary | ICD-10-CM | POA: Diagnosis not present

## 2017-11-01 ENCOUNTER — Other Ambulatory Visit: Payer: Self-pay | Admitting: Surgery

## 2017-11-01 ENCOUNTER — Telehealth: Payer: Self-pay

## 2017-11-01 ENCOUNTER — Encounter: Payer: Self-pay | Admitting: Gastroenterology

## 2017-11-01 ENCOUNTER — Ambulatory Visit: Payer: Medicare Other | Admitting: Gastroenterology

## 2017-11-01 DIAGNOSIS — I10 Essential (primary) hypertension: Secondary | ICD-10-CM | POA: Diagnosis not present

## 2017-11-01 DIAGNOSIS — G500 Trigeminal neuralgia: Secondary | ICD-10-CM | POA: Diagnosis not present

## 2017-11-01 DIAGNOSIS — D509 Iron deficiency anemia, unspecified: Secondary | ICD-10-CM | POA: Diagnosis not present

## 2017-11-01 DIAGNOSIS — M545 Low back pain: Secondary | ICD-10-CM | POA: Diagnosis not present

## 2017-11-01 MED ORDER — TRAMADOL HCL 50 MG PO TABS: 50.0000 mg | ORAL_TABLET | Freq: Two times a day (BID) | ORAL | 0 refills | Status: DC

## 2017-11-01 NOTE — Telephone Encounter (Signed)
We can not do this per Dr. Lowella Dandy policy.

## 2017-11-01 NOTE — Telephone Encounter (Signed)
Called pt and instructed her that she would need to come in for an appt to have medication refilled. Offered her appt for today. Transferred call to front to make appt

## 2017-11-01 NOTE — Telephone Encounter (Signed)
Patient called by Corky Downs NP

## 2017-11-02 ENCOUNTER — Inpatient Hospital Stay: Payer: Medicare Other | Admitting: Surgery

## 2017-11-02 ENCOUNTER — Encounter: Payer: Medicare Other | Admitting: Nurse Practitioner

## 2017-11-02 ENCOUNTER — Telehealth: Payer: Self-pay | Admitting: Surgery

## 2017-11-02 DIAGNOSIS — I10 Essential (primary) hypertension: Secondary | ICD-10-CM | POA: Diagnosis not present

## 2017-11-02 DIAGNOSIS — D509 Iron deficiency anemia, unspecified: Secondary | ICD-10-CM | POA: Diagnosis not present

## 2017-11-02 DIAGNOSIS — G500 Trigeminal neuralgia: Secondary | ICD-10-CM | POA: Diagnosis not present

## 2017-11-02 DIAGNOSIS — M545 Low back pain: Secondary | ICD-10-CM | POA: Diagnosis not present

## 2017-11-02 NOTE — Telephone Encounter (Signed)
Patient needs a refill on flagyl. Please advise

## 2017-11-02 NOTE — Telephone Encounter (Signed)
Patient is requesting Flagyl for muscle spasms.  She denies abdominal pain, fever.chills,nausea or vomiting. She is having regular bowel movements, as well as a good appetite. She states the Flagyl eases her body aches and prevents her back from hurting.  Patient needs an appointment per Dr.Cooper.  Patient is added to 11/09/17 @ 1:45 with Dr.Piscoya. Patient is adamantly requesting Flagyl for the back spasms. She was told that we could not provide Flagyl for back spasms.  Patient verbalized appointment time and date.

## 2017-11-02 NOTE — Telephone Encounter (Signed)
Patient notified and voiced understanding.

## 2017-11-03 ENCOUNTER — Ambulatory Visit (INDEPENDENT_AMBULATORY_CARE_PROVIDER_SITE_OTHER): Payer: Medicare Other

## 2017-11-03 VITALS — BP 118/64 | HR 95 | Temp 97.9°F | Resp 14 | Ht 65.0 in | Wt 151.8 lb

## 2017-11-03 DIAGNOSIS — Z0000 Encounter for general adult medical examination without abnormal findings: Secondary | ICD-10-CM | POA: Diagnosis not present

## 2017-11-03 DIAGNOSIS — Z1331 Encounter for screening for depression: Secondary | ICD-10-CM

## 2017-11-03 NOTE — Progress Notes (Signed)
Subjective:   Denise Barry is a 84 y.o. female who presents for Medicare Annual (Subsequent) preventive examination.  Review of Systems:  No ROS.  Medicare Wellness Visit. Additional risk factors are reflected in the social history.  Cardiac Risk Factors include: advanced age (>83men, >9 women);hypertension     Objective:     Vitals: BP 118/64 (BP Location: Right Arm, Patient Position: Sitting, Cuff Size: Normal)   Pulse 95   Temp 97.9 F (36.6 C) (Oral)   Resp 14   Ht 5\' 5"  (1.651 m)   Wt 151 lb 12.8 oz (68.9 kg)   SpO2 97%   BMI 25.26 kg/m   Body mass index is 25.26 kg/m.  Advanced Directives 11/03/2017 10/05/2017 10/05/2017 08/28/2017 10/14/2016  Does Patient Have a Medical Advance Directive? Yes No No Yes Yes  Type of Paramedic of [place];Living will - - Living will [place];Living will  Does patient want to make changes to medical advance directive? No - Patient declined - - - -  Copy of [place] in Chart? No - copy requested - - - No - copy requested  Would patient like information on creating a medical advance directive? - No - Patient declined No - Patient declined - -    Tobacco Social History   Tobacco Use  Smoking Status Former Smoker  Smokeless Tobacco Never Used     Counseling given: Not Answered   Clinical Intake:  Pre-visit preparation completed: Yes  Pain : No/denies pain     Nutritional Status: BMI 25 -29 Overweight Diabetes: No  How often do you need to have someone help you when you read instructions, pamphlets, or other written materials from your doctor or pharmacy?: 1 - Never  Interpreter Needed?: No     Past Medical History:  Diagnosis Date  . Allergy   . Diverticulitis    11/18  . Hypercholesterolemia   . Hypertension   . Osteoarthritis   . Urinary incontinence    pessary in place  . Varicose veins    H/O   Past Surgical History:  Procedure  Laterality Date  . BACK SURGERY  09/16/13  . DILATION AND CURETTAGE OF UTERUS  1971  . JOINT REPLACEMENT    . sclerosis  2000  . TUBAL LIGATION  1972   Family History  Problem Relation Age of Onset  . Heart disease Mother   . Diabetes Mother   . Hypertension Mother   . Heart disease Father   . Stroke Father   . Cancer Son        bladder  . COPD Son   . Breast cancer Neg Hx    Social History   Socioeconomic History  . Marital status: Widowed    Spouse name: None  . Number of children: None  . Years of education: None  . Highest education level: None  Social Needs  . Financial resource strain: None  . Food insecurity - worry: None  . Food insecurity - inability: None  . Transportation needs - medical: None  . Transportation needs - non-medical: None  Occupational History  . None  Tobacco Use  . Smoking status: Former Research scientist (life sciences)  . Smokeless tobacco: Never Used  Substance and Sexual Activity  . Alcohol use: No    Alcohol/week: 0.0 oz  . Drug use: No  . Sexual activity: No  Other Topics Concern  . None  Social History Narrative  . None  Outpatient Encounter Medications as of 11/03/2017  Medication Sig  . Acetaminophen (TYLENOL EXTRA STRENGTH PO) Take 1-2 tablets by mouth daily.  Marland Kitchen amLODipine (NORVASC) 10 MG tablet Take 1 tablet (10 mg total) by mouth daily.  Marland Kitchen aspirin 81 MG tablet Take 81 mg by mouth daily.  . Eslicarbazepine Acetate 400 MG TABS Take 1 tablet by mouth 3 (three) times daily.  Marland Kitchen estradiol (ESTRACE VAGINAL) 0.1 MG/GM vaginal cream Apply PRN  . Fe Fum-FePoly-Vit C-Vit B3 (INTEGRA) 62.5-62.5-40-3 MG CAPS Take one capsule daily.  . ferrous sulfate 325 (65 FE) MG tablet Take 1 tablet (325 mg total) by mouth daily with breakfast.  . fexofenadine (ALLEGRA) 180 MG tablet Take 180 mg by mouth as needed for allergies or rhinitis.  . Multiple Vitamin (MULTI-VITAMINS) TABS Take 1 tablet by mouth.   Marland Kitchen tiZANidine (ZANAFLEX) 4 MG tablet Take 1 tablet (4 mg total) 3  (three) times daily as needed by mouth for muscle spasms.  . traMADol (ULTRAM) 50 MG tablet Take 1 tablet (50 mg total) by mouth 2 (two) times daily.  . [DISCONTINUED] ciprofloxacin (CIPRO) 500 MG tablet Take 1 tablet (500 mg total) by mouth 2 (two) times daily.  . [DISCONTINUED] metroNIDAZOLE (FLAGYL) 500 MG tablet Take 1 tablet (500 mg total) by mouth 3 (three) times daily.   No facility-administered encounter medications on file as of 11/03/2017.     Activities of Daily Living In your present state of health, do you have any difficulty performing the following activities: 11/03/2017 10/05/2017  Hearing? N N  Vision? N N  Difficulty concentrating or making decisions? N N  Walking or climbing stairs? Y Y  Comment unsteady gait -  Dressing or bathing? Y N  Comment home health assists -  Doing errands, shopping? Tempie Donning  Preparing Food and eating ? Y -  Comment son assists with meal prep -  Using the Toilet? N -  In the past six months, have you accidently leaked urine? N -  Do you have problems with loss of bowel control? N -  Managing your Medications? Y -  Managing your Finances? N -  Comment daughter assists as needed -  Housekeeping or managing your Housekeeping? Y -  Some recent data might be hidden    Patient Care Team: Einar Pheasant, MD as PCP - General (Internal Medicine)    Assessment:   This is a routine wellness examination for Denise Barry. The goal of the wellness visit is to assist the patient how to close the gaps in care and create a preventative care plan for the patient.   The roster of all physicians providing medical care to patient is listed in the Snapshot section of the chart.  Osteoporosis risk reviewed.    Safety issues reviewed; Smoke and carbon monoxide detectors in the home. No firearms in the home.  Wears seatbelts when riding with others. Patient does wear sunscreen or protective clothing when in direct sunlight. No violence in the home.  Depression- PHQ  2 &9 complete.  No signs/symptoms or verbal communication regarding little pleasure in doing things, feeling down, depressed or hopeless. No changes in sleeping, energy, eating, concentrating.  No thoughts of self harm or harm towards others.  Time spent on this topic is 10 minutes.   Patient is alert, normal appearance, oriented to person/place/and time. Correctly identified the president of the [place], recall of 3/3 words, and performing simple calculations. Displays appropriate judgement and can read correct time from watch face.  No new identified risk were noted.  No failures at ADL's or IADL's.  Ambulates with walker/cane.  BMI- discussed the importance of a healthy diet, water intake and the benefits of aerobic exercise. Educational material provided.   24 hour diet recall: Regular diet  Daily fluid intake: 2 cups of caffeine, 3 cups of water  Dental- every 3 months.  Dr. Wayne Both.  Eye- Visual acuity not assessed per patient preference since they have regular follow up with the ophthalmologist.  Wears corrective lenses.  Sleep patterns- Sleeps  hours at night.  Wakes feeling rested.  Health maintenance gaps- closed.  Patient Concerns: None at this time. Follow up with PCP as needed.  Exercise Activities and Dietary recommendations Current Exercise Habits: Home exercise routine, Type of exercise: walking, Time (Minutes): 45, Frequency (Times/Week): 3, Weekly Exercise (Minutes/Week): 135, Intensity: Mild  Goals    . DIET - INCREASE WATER INTAKE       Fall Risk Fall Risk  11/03/2017 10/18/2017 10/05/2017 09/28/2017 09/13/2017  Falls in the past year? Yes Yes Yes No No  Comment - - - - -  Number falls in past yr: 1 2 or more 2 or more - -  Injury with Fall? Yes Yes Yes - -  Comment - - - - -  Risk Factor Category  High Fall Risk High Fall Risk High Fall Risk - -  Risk for fall due to : Impaired balance/gait;History of fall(s) History of fall(s) History of fall(s) - -    Follow up Falls prevention discussed;Education provided Education provided;Falls prevention discussed Falls evaluation completed - -    Depression Screen PHQ 2/9 Scores 11/03/2017 10/18/2017 10/05/2017 09/28/2017  PHQ - 2 Score 0 2 0 0  PHQ- 9 Score 0 10 - -  Exception Documentation - Patient refusal - -     Cognitive Function MMSE - Mini Mental State Exam 11/03/2017  Orientation to time 5  Orientation to Place 5  Registration 3  Attention/ Calculation 5  Recall 3  Language- name 2 objects 2  Language- repeat 1  Language- follow 3 step command 3  Language- read & follow direction 1  Write a sentence 1  Copy design 1  Total score 30     6CIT Screen 10/14/2016  What Year? 0 points  What month? 0 points  What time? 0 points  Count back from 20 0 points  Months in reverse 0 points  Repeat phrase 0 points  Total Score 0    Immunization History  Administered Date(s) Administered  . Influenza Split 09/30/2013, 07/03/2014  . Influenza-Unspecified 07/01/2016  . Pneumococcal Conjugate-13 01/06/2014  . Pneumococcal Polysaccharide-23 11/15/2015  . Tdap 12/30/2012  . Zoster 11/15/2015    Screening Tests Health Maintenance  Topic Date Due  . TETANUS/TDAP  01/01/2023  . INFLUENZA VACCINE  Completed  . DEXA SCAN  Completed  . PNA vac Low Risk Adult  Completed       Plan:    End of life planning; Advance aging; Advanced directives discussed. Copy of current HCPOA/Living Will requested.    I have personally reviewed and noted the following in the patient's chart:   . Medical and social history . Use of alcohol, tobacco or illicit drugs  . Current medications and supplements . Functional ability and status . Nutritional status . Physical activity . Advanced directives . List of other physicians . Hospitalizations, surgeries, and ER visits in previous 12 months . Vitals . Screenings to include cognitive, depression, and falls .  Referrals and appointments  In  addition, I have reviewed and discussed with patient certain preventive protocols, quality metrics, and best practice recommendations. A written personalized care plan for preventive services as well as general preventive health recommendations were provided to patient.     Varney Biles, LPN  11/05/1094   Reviewed above information.  Agree with assessment and plan.    Dr Nicki Reaper

## 2017-11-03 NOTE — Patient Instructions (Addendum)
  Ms. Maudlin , Thank you for taking time to come for your Medicare Wellness Visit. I appreciate your ongoing commitment to your health goals. Please review the following plan we discussed and let me know if I can assist you in the future.   Follow up with Dr. Nicki Reaper as needed.    Bring a copy of your [place] and/or Living Will to be scanned into chart.  Have a great day!  These are the goals we discussed: Goals    . DIET - INCREASE WATER INTAKE       This is a list of the screening recommended for you and due dates:  Health Maintenance  Topic Date Due  . Tetanus Vaccine  01/01/2023  . Flu Shot  Completed  . DEXA scan (bone density measurement)  Completed  . Pneumonia vaccines  Completed

## 2017-11-04 DIAGNOSIS — M545 Low back pain: Secondary | ICD-10-CM | POA: Diagnosis not present

## 2017-11-04 DIAGNOSIS — G500 Trigeminal neuralgia: Secondary | ICD-10-CM | POA: Diagnosis not present

## 2017-11-04 DIAGNOSIS — K5790 Diverticulosis of intestine, part unspecified, without perforation or abscess without bleeding: Secondary | ICD-10-CM | POA: Diagnosis not present

## 2017-11-04 DIAGNOSIS — M1991 Primary osteoarthritis, unspecified site: Secondary | ICD-10-CM | POA: Diagnosis not present

## 2017-11-04 DIAGNOSIS — R269 Unspecified abnormalities of gait and mobility: Secondary | ICD-10-CM | POA: Diagnosis not present

## 2017-11-04 DIAGNOSIS — I10 Essential (primary) hypertension: Secondary | ICD-10-CM | POA: Diagnosis not present

## 2017-11-04 DIAGNOSIS — D509 Iron deficiency anemia, unspecified: Secondary | ICD-10-CM | POA: Diagnosis not present

## 2017-11-04 DIAGNOSIS — Z87891 Personal history of nicotine dependence: Secondary | ICD-10-CM | POA: Diagnosis not present

## 2017-11-06 ENCOUNTER — Encounter: Payer: Self-pay | Admitting: Gastroenterology

## 2017-11-06 ENCOUNTER — Ambulatory Visit (INDEPENDENT_AMBULATORY_CARE_PROVIDER_SITE_OTHER): Payer: Medicare Other | Admitting: Gastroenterology

## 2017-11-06 VITALS — BP 118/76 | HR 72 | Ht 65.5 in | Wt 151.8 lb

## 2017-11-06 DIAGNOSIS — D509 Iron deficiency anemia, unspecified: Secondary | ICD-10-CM

## 2017-11-06 DIAGNOSIS — K5732 Diverticulitis of large intestine without perforation or abscess without bleeding: Secondary | ICD-10-CM

## 2017-11-06 DIAGNOSIS — R1013 Epigastric pain: Secondary | ICD-10-CM

## 2017-11-06 NOTE — Progress Notes (Signed)
Jonathon Bellows MD, MRCP(U.K) [address]  [place]  [place], [place] [address]  Main: [phone]  Fax: [phone]   Gastroenterology Consultation  Referring Provider:     Einar Pheasant, MD Primary Care Physician:  Einar Pheasant, MD Primary Gastroenterologist:  Dr. Jonathon Bellows  Reason for Consultation:     Diverticulitis        HPI:   Denise Barry is a 84 y.o. y/o female referred for consultation & management  by Dr. Einar Pheasant, MD.    She has been referred for diverticulitis. She was admitted on 10/05/17 with acute sigmoid diverticulitis with microperforation  along with anemia. No overt bleeding. Hb 7.5, MCV 79. Treated  Conservatively with IV fluids and antibiotics. During admission Hb dropped to 5.8 grams , iron studies showed low iron of 6 and iron % saturation of 3 . She was seen by Dr Alice Reichert from the [facility]. Last colonoscopy in 2008 was apparently normal . She was advised to undergo outpatient endoscopy . I reviewed her old labs and it suggests low Hb of 10.1 grams 3 months back but was normal 8 months back.   She recalls that she has had issues with her back, diarrhea on and off. She denies any over blood loss from stool , urine, vagina or nose bleeds. Denies any blood thinners except 81 mg asprin. No nsaid use. No weight loss. On iron tablets but yet to start. Apetite is normal. Normal bowel movements, no abdominal pain or change in bowel habits.    CBC Latest Ref Rng & Units 10/19/2017 10/11/2017 10/10/2017  WBC 4.0 - 10.5 K/uL 9.7 9.7 14.5(H)  Hemoglobin 12.0 - 15.0 g/dL 9.6(L) 9.3(L) 9.3(L)  Hematocrit 36.0 - 46.0 % 30.6(L) 28.8(L) 29.0(L)  Platelets 150.0 - 400.0 K/uL 690.0(H) 618(H) 614(H)     Iron/TIBC/Ferritin/ %Sat    Component Value Date/Time   IRON 6 (L) 10/06/2017 1351   TIBC 218 (L) 10/06/2017 1351   FERRITIN 14.3 10/19/2017 1634   IRONPCTSAT 3 (L) 10/06/2017 1351     Past Medical History:  Diagnosis Date  . Allergy     . Diverticulitis    11/18  . Hypercholesterolemia   . Hypertension   . Osteoarthritis   . Urinary incontinence    pessary in place  . Varicose veins    H/O    Past Surgical History:  Procedure Laterality Date  . BACK SURGERY  09/16/13  . DILATION AND CURETTAGE OF UTERUS  1971  . JOINT REPLACEMENT    . sclerosis  2000  . TUBAL LIGATION  1972    Prior to Admission medications   Medication Sig Start Date End Date Taking? Authorizing Provider  amLODipine (NORVASC) 10 MG tablet Take 1 tablet (10 mg total) by mouth daily. 07/05/17  Yes Einar Pheasant, MD  aspirin 81 MG tablet Take 81 mg by mouth daily.   Yes [provider]  Eslicarbazepine Acetate 400 MG TABS Take 1 tablet by mouth 3 (three) times daily.   Yes [provider]  estradiol (ESTRACE VAGINAL) 0.1 MG/GM vaginal cream Apply PRN 05/22/16  Yes [provider]  Fe Fum-FePoly-Vit C-Vit B3 (INTEGRA) 62.5-62.5-40-3 MG CAPS Take one capsule daily. 10/27/17  Yes Einar Pheasant, MD  ferrous sulfate 325 (65 FE) MG tablet Take 1 tablet (325 mg total) by mouth daily with breakfast. 10/27/17  Yes Leone Haven, MD  fexofenadine (ALLEGRA) 180 MG tablet Take 180 mg by mouth as needed for allergies or rhinitis.  Yes [provider]  Multiple Vitamin (MULTI-VITAMINS) TABS Take 1 tablet by mouth.    Yes [provider]  tiZANidine (ZANAFLEX) 4 MG tablet Take 1 tablet (4 mg total) 3 (three) times daily as needed by mouth for muscle spasms. 09/08/17  Yes Einar Pheasant, MD  traMADol (ULTRAM) 50 MG tablet Take 1 tablet (50 mg total) by mouth 2 (two) times daily. 11/01/17 12/01/17 Yes King, Diona Foley, NP  Acetaminophen (TYLENOL EXTRA STRENGTH PO) Take 1-2 tablets by mouth daily.    [provider]    Family History  Problem Relation Age of Onset  . Heart disease Mother   . Diabetes Mother   . Hypertension Mother   . Heart disease Father   . Stroke Father   . Cancer Son        bladder   . COPD Son   . Breast cancer Neg Hx      Social History   Tobacco Use  . Smoking status: Former Research scientist (life sciences)  . Smokeless tobacco: Never Used  Substance Use Topics  . Alcohol use: No    Alcohol/week: 0.0 oz  . Drug use: No    Allergies as of 11/06/2017 - Review Complete 11/06/2017  Allergen Reaction Noted  . Codeine Nausea Only 01/06/2014  . Cyclobenzaprine  05/07/2015  . Methocarbamol  05/07/2015  . Carbamazepine Anxiety 05/07/2015  . Gabapentin Anxiety 05/07/2015    Review of Systems:    All systems reviewed and negative except where noted in HPI.   Physical Exam:  BP 118/76 (BP Location: Left Arm, Patient Position: Sitting, Cuff Size: Normal)   Pulse 72   Ht 5' 5.5" (1.664 m)   Wt 151 lb 12.8 oz (68.9 kg)   BMI 24.88 kg/m  No LMP recorded. Patient is postmenopausal. Psych:  Alert and cooperative. Normal mood and affect. General:   Alert,  Well-developed, well-nourished, pleasant and cooperative in NAD Head:  Normocephalic and atraumatic. Eyes:  Sclera clear, no icterus.   Conjunctiva pink. Ears:  Normal auditory acuity. Nose:  No deformity, discharge, or lesions. Mouth:  No deformity or lesions,oropharynx pink & moist. Neck:  Supple; no masses or thyromegaly. Lungs:  Respirations even and unlabored.  Clear throughout to auscultation.   No wheezes, crackles, or rhonchi. No acute distress. Heart:  Regular rate and rhythm; no murmurs, clicks, rubs, or gallops. Abdomen:  Normal bowel sounds.  No bruits.  Soft, non-tender and non-distended without masses, hepatosplenomegaly or hernias noted.  No guarding or rebound tenderness.    Extremities:  No clubbing or edema.  No cyanosis. Neurologic:  Alert and oriented x3;  grossly normal neurologically. Skin:  Intact without significant lesions or rashes. No jaundice. Lymph Nodes:  No significant cervical adenopathy. Psych:  Alert and cooperative. Normal mood and affect.  Imaging Studies: No results found.  Assessment and  Plan:   Denise Barry is a 84 y.o. y/o female has been referred for  sigmoid diverticulitis with microperforation in 09/2017 , treated conservatively with antibiotics. In addition has new onset microcytic iron deficiency anemia.    Plan  1. EGD+ colonoscpy in 3-4 weeks , will need CT scan to show resolution prior 2. CT scan to check for resolution of diverticulitis and perforation in 2 weeks  3. Continue oral iron , check urine for blood loss, will need capsule study of endoscopy is negative.  4. Check b12,folate   Follow up in 2 months   Dr Jonathon Bellows MD,MRCP(U.K)

## 2017-11-06 NOTE — Addendum Note (Signed)
Addended by: Peggye Ley on: 11/06/2017 01:18 PM   Modules accepted: Orders

## 2017-11-07 DIAGNOSIS — M545 Low back pain: Secondary | ICD-10-CM | POA: Diagnosis not present

## 2017-11-07 DIAGNOSIS — D509 Iron deficiency anemia, unspecified: Secondary | ICD-10-CM | POA: Diagnosis not present

## 2017-11-07 DIAGNOSIS — K5790 Diverticulosis of intestine, part unspecified, without perforation or abscess without bleeding: Secondary | ICD-10-CM | POA: Diagnosis not present

## 2017-11-07 DIAGNOSIS — G500 Trigeminal neuralgia: Secondary | ICD-10-CM | POA: Diagnosis not present

## 2017-11-07 DIAGNOSIS — R269 Unspecified abnormalities of gait and mobility: Secondary | ICD-10-CM | POA: Diagnosis not present

## 2017-11-07 DIAGNOSIS — I10 Essential (primary) hypertension: Secondary | ICD-10-CM | POA: Diagnosis not present

## 2017-11-08 LAB — URINALYSIS, ROUTINE W REFLEX MICROSCOPIC
Bilirubin, UA: NEGATIVE
Glucose, UA: NEGATIVE
Nitrite, UA: POSITIVE — AB
PH UA: 5.5 (ref 5.0–7.5)
RBC, UA: NEGATIVE
Specific Gravity, UA: >=1.03 — AB (ref 1.005–1.030)
Urobilinogen, Ur: 0.2 mg/dL (ref 0.2–1.0)

## 2017-11-08 LAB — MICROSCOPIC EXAMINATION: CASTS: NONE SEEN /LPF

## 2017-11-09 ENCOUNTER — Ambulatory Visit: Payer: Medicare Other | Admitting: Surgery

## 2017-11-09 DIAGNOSIS — K5790 Diverticulosis of intestine, part unspecified, without perforation or abscess without bleeding: Secondary | ICD-10-CM | POA: Diagnosis not present

## 2017-11-09 DIAGNOSIS — G500 Trigeminal neuralgia: Secondary | ICD-10-CM | POA: Diagnosis not present

## 2017-11-09 DIAGNOSIS — M545 Low back pain: Secondary | ICD-10-CM | POA: Diagnosis not present

## 2017-11-09 DIAGNOSIS — R269 Unspecified abnormalities of gait and mobility: Secondary | ICD-10-CM | POA: Diagnosis not present

## 2017-11-09 DIAGNOSIS — D509 Iron deficiency anemia, unspecified: Secondary | ICD-10-CM | POA: Diagnosis not present

## 2017-11-09 DIAGNOSIS — I10 Essential (primary) hypertension: Secondary | ICD-10-CM | POA: Diagnosis not present

## 2017-11-13 ENCOUNTER — Telehealth: Payer: Self-pay

## 2017-11-13 DIAGNOSIS — D509 Iron deficiency anemia, unspecified: Secondary | ICD-10-CM | POA: Diagnosis not present

## 2017-11-13 DIAGNOSIS — M545 Low back pain: Secondary | ICD-10-CM | POA: Diagnosis not present

## 2017-11-13 DIAGNOSIS — K5790 Diverticulosis of intestine, part unspecified, without perforation or abscess without bleeding: Secondary | ICD-10-CM | POA: Diagnosis not present

## 2017-11-13 DIAGNOSIS — R269 Unspecified abnormalities of gait and mobility: Secondary | ICD-10-CM | POA: Diagnosis not present

## 2017-11-13 DIAGNOSIS — I10 Essential (primary) hypertension: Secondary | ICD-10-CM | POA: Diagnosis not present

## 2017-11-13 DIAGNOSIS — G500 Trigeminal neuralgia: Secondary | ICD-10-CM | POA: Diagnosis not present

## 2017-11-13 NOTE — Telephone Encounter (Signed)
Advised patient of lab results.   Patient states she is having a procedure done at [facility]. She will have urinalysis repeated there.   Advised her to have results sent to PCP for follow-up with Urology if needed.

## 2017-11-14 ENCOUNTER — Other Ambulatory Visit: Payer: Self-pay

## 2017-11-14 ENCOUNTER — Ambulatory Visit
Admission: RE | Admit: 2017-11-14 | Discharge: 2017-11-14 | Disposition: A | Discharge status: Home / Self Care | Payer: Medicare Other | Source: Ambulatory Visit | Attending: Pain Medicine | Admitting: Pain Medicine

## 2017-11-14 ENCOUNTER — Telehealth: Payer: Self-pay | Admitting: *Deleted

## 2017-11-14 ENCOUNTER — Ambulatory Visit (HOSPITAL_BASED_OUTPATIENT_CLINIC_OR_DEPARTMENT_OTHER): Payer: Medicare Other | Admitting: Pain Medicine

## 2017-11-14 ENCOUNTER — Encounter: Payer: Self-pay | Admitting: Pain Medicine

## 2017-11-14 ENCOUNTER — Other Ambulatory Visit: Payer: Self-pay | Admitting: Internal Medicine

## 2017-11-14 VITALS — BP 122/70 | HR 87 | Temp 97.1°F | Resp 16 | Ht 65.0 in | Wt 155.0 lb

## 2017-11-14 DIAGNOSIS — M47816 Spondylosis without myelopathy or radiculopathy, lumbar region: Secondary | ICD-10-CM | POA: Diagnosis not present

## 2017-11-14 DIAGNOSIS — Z885 Allergy status to narcotic agent status: Secondary | ICD-10-CM | POA: Insufficient documentation

## 2017-11-14 DIAGNOSIS — M545 Low back pain, unspecified: Secondary | ICD-10-CM

## 2017-11-14 DIAGNOSIS — M533 Sacrococcygeal disorders, not elsewhere classified: Secondary | ICD-10-CM

## 2017-11-14 DIAGNOSIS — R319 Hematuria, unspecified: Secondary | ICD-10-CM

## 2017-11-14 DIAGNOSIS — G8929 Other chronic pain: Secondary | ICD-10-CM | POA: Insufficient documentation

## 2017-11-14 DIAGNOSIS — R300 Dysuria: Secondary | ICD-10-CM

## 2017-11-14 MED ORDER — ROPIVACAINE HCL 2 MG/ML IJ SOLN
9.0000 mL | Freq: Once | INTRAMUSCULAR | Status: AC
  Administered 2017-11-14: 10 mL via INTRA_ARTICULAR

## 2017-11-14 MED ORDER — MIDAZOLAM HCL 5 MG/5ML IJ SOLN
1.0000 mg | INTRAMUSCULAR | Status: DC | PRN
  Administered 2017-11-14: 1.5 mg via INTRAVENOUS
  Filled 2017-11-14: qty 5

## 2017-11-14 MED ORDER — METHYLPREDNISOLONE ACETATE 80 MG/ML IJ SUSP
80.0000 mg | Freq: Once | INTRAMUSCULAR | Status: AC
  Administered 2017-11-14: 80 mg via INTRA_ARTICULAR
  Filled 2017-11-14: qty 1

## 2017-11-14 MED ORDER — TRIAMCINOLONE ACETONIDE 40 MG/ML IJ SUSP
40.0000 mg | Freq: Once | INTRAMUSCULAR | Status: AC
  Administered 2017-11-14: 40 mg
  Filled 2017-11-14: qty 1

## 2017-11-14 MED ORDER — LIDOCAINE HCL 2 % IJ SOLN
10.0000 mL | Freq: Once | INTRAMUSCULAR | Status: AC
  Administered 2017-11-14: 400 mg
  Filled 2017-11-14: qty 20

## 2017-11-14 MED ORDER — ROPIVACAINE HCL 2 MG/ML IJ SOLN
9.0000 mL | Freq: Once | INTRAMUSCULAR | Status: AC
  Administered 2017-11-14: 10 mL via PERINEURAL

## 2017-11-14 MED ORDER — LACTATED RINGERS IV SOLN
1000.0000 mL | Freq: Once | INTRAVENOUS | Status: AC
  Administered 2017-11-14: 1000 mL via INTRAVENOUS

## 2017-11-14 MED ORDER — FENTANYL CITRATE (PF) 100 MCG/2ML IJ SOLN
25.0000 ug | INTRAMUSCULAR | Status: DC | PRN
  Administered 2017-11-14: 75 ug via INTRAVENOUS
  Filled 2017-11-14: qty 2

## 2017-11-14 NOTE — Patient Instructions (Signed)

## 2017-11-14 NOTE — Progress Notes (Signed)
Patient's Name: Denise Barry  MRN: [id number]  Referring Provider: Einar Pheasant, MD  DOB: April 07, 1934  PCP: Einar Pheasant, MD  DOS: 11/14/2017  Note by: Gaspar Cola, MD  Service setting: Ambulatory outpatient  Specialty: Interventional Pain Management  Patient type: Established  Location: [facility] (AMB) Pain Management Facility  Visit type: Interventional Procedure   Primary Reason for Visit: Interventional Pain Management Treatment. CC: Back Pain (lower)  Procedure:  Anesthesia, Analgesia, Anxiolysis:  Procedure #1: Type: Diagnostic Medial Branch Facet Block #2  Region: Lumbar Level: L2, L3, L4, L5, & S1 Medial Branch Level(s) Laterality: Bilateral  Procedure #2: Type: Diagnostic Sacroiliac Joint Block #2  Region: Posterior Lumbosacral Level: PSIS (Posterior Superior Iliac Spine) Sacroiliac Joint Laterality: Bilateral  Type: Local Anesthesia with Moderate (Conscious) Sedation Local Anesthetic: Lidocaine 1% Route: Intravenous (IV) IV Access: Secured Sedation: Meaningful verbal contact was maintained at all times during the procedure  Indication(s): Analgesia and Anxiety   Indications: 1. Chronic low back pain (Primary Area of Pain)  (Bilateral) (L>R)   2. Lumbar facet syndrome (Bilateral) (L>R)   3. Chronic sacroiliac joint pain (Bilateral) (L>R)   4. Chronic bilateral low back pain without sciatica   5. Facet syndrome, lumbar   6. Chronic sacroiliac joint pain    Pain Score: Pre-procedure: 3 /10 Post-procedure: 0-No pain/10  Pre-op Assessment:  Denise Barry is a 84 y.o. (year old), female patient, seen today for interventional treatment. She  has a past surgical history that includes Back surgery (09/16/13); sclerosis (2000); Dilation and curettage of uterus (1971); Tubal ligation (1972); and Joint replacement. Denise Barry has a current medication list which includes the following prescription(s): acetaminophen, amlodipine, aspirin, eslicarbazepine acetate,  estradiol, integra, ferrous sulfate, fexofenadine, multi-vitamins, tizanidine, and tramadol, and the following Facility-Administered Medications: fentanyl and midazolam. Her primarily concern today is the Back Pain (lower)  Initial Vital Signs: There were no vitals taken for this visit. BMI: Estimated body mass index is 25.79 kg/m as calculated from the following:   Height as of this encounter: 5\' 5"  (1.651 m).   Weight as of this encounter: 155 lb (70.3 kg).  Risk Assessment: Allergies: Reviewed. She is allergic to codeine; cyclobenzaprine; methocarbamol; carbamazepine; and gabapentin.  Allergy Precautions: None required Coagulopathies: Reviewed. None identified.  Blood-thinner therapy: None at this time Active Infection(s): Reviewed. None identified. Denise Barry is afebrile  Site Confirmation: Denise Barry was asked to confirm the procedure and laterality before marking the site Procedure checklist: Completed Consent: Before the procedure and under the influence of no sedative(s), amnesic(s), or anxiolytics, the patient was informed of the treatment options, risks and possible complications. To fulfill our ethical and legal obligations, as recommended by the American Medical Association's Code of Ethics, I have informed the patient of my clinical impression; the nature and purpose of the treatment or procedure; the risks, benefits, and possible complications of the intervention; the alternatives, including doing nothing; the risk(s) and benefit(s) of the alternative treatment(s) or procedure(s); and the risk(s) and benefit(s) of doing nothing. The patient was provided information about the general risks and possible complications associated with the procedure. These may include, but are not limited to: failure to achieve desired goals, infection, bleeding, organ or nerve damage, allergic reactions, paralysis, and death. In addition, the patient was informed of those risks and complications  associated to Spine-related procedures, such as failure to decrease pain; infection (i.e.: Meningitis, epidural or intraspinal abscess); bleeding (i.e.: epidural hematoma, subarachnoid hemorrhage, or any other type of intraspinal or peri-dural bleeding);  organ or nerve damage (i.e.: Any type of peripheral nerve, nerve root, or spinal cord injury) with subsequent damage to sensory, motor, and/or autonomic systems, resulting in permanent pain, numbness, and/or weakness of one or several areas of the body; allergic reactions; (i.e.: anaphylactic reaction); and/or death. Furthermore, the patient was informed of those risks and complications associated with the medications. These include, but are not limited to: allergic reactions (i.e.: anaphylactic or anaphylactoid reaction(s)); adrenal axis suppression; blood sugar elevation that in diabetics may result in ketoacidosis or comma; water retention that in patients with history of congestive heart failure may result in shortness of breath, pulmonary edema, and decompensation with resultant heart failure; weight gain; swelling or edema; medication-induced neural toxicity; particulate matter embolism and blood vessel occlusion with resultant organ, and/or nervous system infarction; and/or aseptic necrosis of one or more joints. Finally, the patient was informed that Medicine is not an exact science; therefore, there is also the possibility of unforeseen or unpredictable risks and/or possible complications that may result in a catastrophic outcome. The patient indicated having understood very clearly. We have given the patient no guarantees and we have made no promises. Enough time was given to the patient to ask questions, all of which were answered to the patient's satisfaction. Denise Barry has indicated that she wanted to continue with the procedure. Attestation: I, the ordering provider, attest that I have discussed with the patient the benefits, risks, side-effects,  alternatives, likelihood of achieving goals, and potential problems during recovery for the procedure that I have provided informed consent. Date: 11/14/2017; Time: 7:49 AM  Pre-Procedure Preparation:  Monitoring: As per clinic protocol. Respiration, ETCO2, SpO2, BP, heart rate and rhythm monitor placed and checked for adequate function Safety Precautions: Patient was assessed for positional comfort and pressure points before starting the procedure. Time-out: I initiated and conducted the "Time-out" before starting the procedure, as per protocol. The patient was asked to participate by confirming the accuracy of the "Time Out" information. Verification of the correct person, site, and procedure were performed and confirmed by me, the nursing staff, and the patient. "Time-out" conducted as per Joint Commission's Universal Protocol (UP.01.01.01). "Time-out" Date & Time: 11/14/2017; 1140 hrs.  Description of Procedure #1 Process:   Time-out: "Time-out" completed before starting procedure, as per protocol. Position: Prone Target Area: For Lumbar Facet blocks, the target is the groove formed by the junction of the transverse process and superior articular process. For the L5 dorsal ramus, the target is the notch between superior articular process and sacral ala. For the S1 dorsal ramus, the target is the superior and lateral edge of the posterior S1 Sacral foramen. Approach: Paramedial approach. Area Prepped: Entire Posterior Lumbosacral Region Prepping solution: ChloraPrep (2% chlorhexidine gluconate and 70% isopropyl alcohol) Safety Precautions: Aspiration looking for blood return was conducted prior to all injections. At no point did we inject any substances, as a needle was being advanced. No attempts were made at seeking any paresthesias. Safe injection practices and needle disposal techniques used. Medications properly checked for expiration dates. SDV (single dose vial) medications used.   Description of the Procedure: Protocol guidelines were followed. The patient was placed in position over the fluoroscopy table. The target area was identified and the area prepped in the usual manner. Skin desensitized using vapocoolant spray. Skin & deeper tissues infiltrated with local anesthetic. Appropriate amount of time allowed to pass for local anesthetics to take effect. The procedure needle was introduced through the skin, ipsilateral to the reported pain, and  advanced to the target area. Employing the "Medial Branch Technique", the needles were advanced to the angle made by the superior and medial portion of the transverse process, and the lateral and inferior portion of the superior articulating process of the targeted vertebral bodies. This area is known as "Burton's Eye" or the "Eye of the [place] Dog". A procedure needle was introduced through the skin, and this time advanced to the angle made by the superior and medial border of the sacral ala, and the lateral border of the S1 vertebral body. This last needle was later repositioned at the superior and lateral border of the posterior S1 foramen. Negative aspiration confirmed. Solution injected in intermittent fashion, asking for systemic symptoms every 0.5cc of injectate. The needles were then removed and the area cleansed, making sure to leave some of the prepping solution back to take advantage of its long term bactericidal properties. Start Time: 1140 hrs. Materials:  Needle(s) Type: Regular needle Gauge: 22G Length: 3.5-in Medication(s): We administered midazolam, fentaNYL, lactated ringers, lidocaine, ropivacaine (PF) 2 mg/mL (0.2%), triamcinolone acetonide, ropivacaine (PF) 2 mg/mL (0.2%), triamcinolone acetonide, ropivacaine (PF) 2 mg/mL (0.2%), and methylPREDNISolone acetate. Please see chart orders for dosing details.  Description of Procedure # 2 Process:   Position: Prone Target Area: For upper sacroiliac joint block(s), the  target is the superior and posterior margin of the sacroiliac joint. Approach: Ipsilateral approach. Area Prepped: Entire Posterior Lumbosacral Region Prepping solution: ChloraPrep (2% chlorhexidine gluconate and 70% isopropyl alcohol) Safety Precautions: Aspiration looking for blood return was conducted prior to all injections. At no point did we inject any substances, as a needle was being advanced. No attempts were made at seeking any paresthesias. Safe injection practices and needle disposal techniques used. Medications properly checked for expiration dates. SDV (single dose vial) medications used. Description of the Procedure: Protocol guidelines were followed. The patient was placed in position over the fluoroscopy table. The target area was identified and the area prepped in the usual manner. Skin desensitized using vapocoolant spray. Skin & deeper tissues infiltrated with local anesthetic. Appropriate amount of time allowed to pass for local anesthetics to take effect. The procedure needle was advanced under fluoroscopic guidance into the sacroiliac joint until a firm endpoint was obtained. Proper needle placement secured. Negative aspiration confirmed. Solution injected in intermittent fashion, asking for systemic symptoms every 0.5cc of injectate. The needles were then removed and the area cleansed, making sure to leave some of the prepping solution back to take advantage of its long term bactericidal properties. Vitals:   11/14/17 1156 11/14/17 1206 11/14/17 1216 11/14/17 1226  BP: 140/68 (!) 159/73 (!) 142/80 122/70  Pulse:      Resp: 15 14 16 16   Temp:  (!) 97.5 F (36.4 C)  (!) 97.1 F (36.2 C)  TempSrc:      SpO2: 96% 97% 97% 97%  Weight:      Height:        End Time: 1156 hrs. Materials:  Needle(s) Type: Regular needle Gauge: 22G Length: 3.5-in Medication(s): We administered midazolam, fentaNYL, lactated ringers, lidocaine, ropivacaine (PF) 2 mg/mL (0.2%), triamcinolone  acetonide, ropivacaine (PF) 2 mg/mL (0.2%), triamcinolone acetonide, ropivacaine (PF) 2 mg/mL (0.2%), and methylPREDNISolone acetate. Please see chart orders for dosing details.  Imaging Guidance (Spinal):  Type of Imaging Technique: Fluoroscopy Guidance (Spinal) Indication(s): Assistance in needle guidance and placement for procedures requiring needle placement in or near specific anatomical locations not easily accessible without such assistance. Exposure Time: Please see nurses notes. Contrast:  None used. Fluoroscopic Guidance: I was personally present during the use of fluoroscopy. "Tunnel Vision Technique" used to obtain the best possible view of the target area. Parallax error corrected before commencing the procedure. "Direction-depth-direction" technique used to introduce the needle under continuous pulsed fluoroscopy. Once target was reached, antero-posterior, oblique, and lateral fluoroscopic projection used confirm needle placement in all planes. Images permanently stored in EMR. Interpretation: No contrast injected. I personally interpreted the imaging intraoperatively. Adequate needle placement confirmed in multiple planes. Permanent images saved into the patient's record.  Antibiotic Prophylaxis:  Indication(s): None identified Antibiotic given: None  Post-operative Assessment:  EBL: None Complications: No immediate post-treatment complications observed by team, or reported by patient. Note: The patient tolerated the entire procedure well. A repeat set of vitals were taken after the procedure and the patient was kept under observation following institutional policy, for this type of procedure. Post-procedural neurological assessment was performed, showing return to baseline, prior to discharge. The patient was provided with post-procedure discharge instructions, including a section on how to identify potential problems. Should any problems arise concerning this procedure, the patient  was given instructions to immediately contact us, at any time, without hesitation. In any case, we plan to contact the patient by telephone for a follow-up status report regarding this interventional procedure. Comments:  No additional relevant information.  Plan of Care    Imaging Orders     DG C-Arm 1-60 Min-No Report  Procedure Orders     LUMBAR FACET(MEDIAL BRANCH NERVE BLOCK) MBNB     SACROILIAC JOINT INJECTION  Medications ordered for procedure: Meds ordered this encounter  Medications  . midazolam (VERSED) 5 MG/5ML injection 1-2 mg    Make sure Flumazenil is available in the pyxis when using this medication. If oversedation occurs, administer 0.2 mg IV over 15 sec. If after 45 sec no response, administer 0.2 mg again over 1 min; may repeat at 1 min intervals; not to exceed 4 doses (1 mg)  . fentaNYL (SUBLIMAZE) injection 25-50 mcg    Make sure Narcan is available in the pyxis when using this medication. In the event of respiratory depression (RR< 8/min): Titrate NARCAN (naloxone) in increments of 0.1 to 0.2 mg IV at 2-3 minute intervals, until desired degree of reversal.  . lactated ringers infusion 1,000 mL  . lidocaine (XYLOCAINE) 2 % (with pres) injection 200 mg  . ropivacaine (PF) 2 mg/mL (0.2%) (NAROPIN) injection 9 mL  . triamcinolone acetonide (KENALOG-40) injection 40 mg  . ropivacaine (PF) 2 mg/mL (0.2%) (NAROPIN) injection 9 mL  . triamcinolone acetonide (KENALOG-40) injection 40 mg  . ropivacaine (PF) 2 mg/mL (0.2%) (NAROPIN) injection 9 mL  . methylPREDNISolone acetate (DEPO-MEDROL) injection 80 mg   Medications administered: We administered midazolam, fentaNYL, lactated ringers, lidocaine, ropivacaine (PF) 2 mg/mL (0.2%), triamcinolone acetonide, ropivacaine (PF) 2 mg/mL (0.2%), triamcinolone acetonide, ropivacaine (PF) 2 mg/mL (0.2%), and methylPREDNISolone acetate.  See the medical record for exact dosing, route, and time of administration.  New Prescriptions    No medications on file   Disposition: Discharge home  Discharge Date & Time: 11/14/2017; 1230 hrs.   Physician-requested Follow-up: Return for post-procedure eval (2 wks), w/ Dr. Dossie Arbour. Future Appointments  Date Time Provider [place]  11/22/2017 11:30 AM Einar Pheasant, MD [facility]  11/27/2017  1:00 PM [facility]  11/29/2017 11:15 AM Milinda Pointer, MD [facility] None  01/15/2018  9:40 AM MCM MM DEXA [facility]  11/06/2018  1:00 PM O'Brien-Blaney, Denisa L, LPN [facility]  Primary Care Physician: Einar Pheasant, MD Location: [facility] Note by: Gaspar Cola, MD Date: 11/14/2017; Time: 1:04 PM  Disclaimer:  Medicine is not an Chief Strategy Officer. The only guarantee in medicine is that nothing is guaranteed. It is important to note that the decision to proceed with this intervention was based on the information collected from the patient. The Data and conclusions were drawn from the patient's questionnaire, the interview, and the physical examination. Because the information was provided in large part by the patient, it cannot be guaranteed that it has not been purposely or unconsciously manipulated. Every effort has been made to obtain as much relevant data as possible for this evaluation. It is important to note that the conclusions that lead to this procedure are derived in large part from the available data. Always take into account that the treatment will also be dependent on availability of resources and existing treatment guidelines, considered by other Pain Management Practitioners as being common knowledge and practice, at the time of the intervention. For Medico-Legal purposes, it is also important to point out that variation in procedural techniques and pharmacological choices are the acceptable norm. The indications, contraindications, technique, and results of the above procedure should only be interpreted and  judged by a Board-Certified Interventional Pain Specialist with extensive familiarity and expertise in the same exact procedure and technique.

## 2017-11-14 NOTE — Telephone Encounter (Signed)
-----   Message from Einar Pheasant, MD sent at 11/13/2017  8:48 PM EST ----- Received this note from GI - Dr Vicente Males.  He is recommending a f/u urinalysis.  If not ordered by GI, needs a f/u urinalysis here.

## 2017-11-14 NOTE — Telephone Encounter (Signed)
Called patient lefty message needs lab appointment for [facility] may schedule.

## 2017-11-14 NOTE — Progress Notes (Signed)
Order placed for urine and culture °

## 2017-11-14 NOTE — Progress Notes (Signed)
Safety precautions to be maintained throughout the outpatient stay will include: orient to surroundings, keep bed in low position, maintain call bell within reach at all times, provide assistance with transfer out of bed and ambulation.  

## 2017-11-15 ENCOUNTER — Other Ambulatory Visit: Payer: Medicare Other

## 2017-11-15 ENCOUNTER — Telehealth: Payer: Self-pay | Admitting: *Deleted

## 2017-11-15 NOTE — Addendum Note (Signed)
Addended by: Arby Barrette on: 11/15/2017 11:02 AM   Modules accepted: Orders

## 2017-11-15 NOTE — Telephone Encounter (Signed)
Denies complications post procedure. 

## 2017-11-16 ENCOUNTER — Other Ambulatory Visit (INDEPENDENT_AMBULATORY_CARE_PROVIDER_SITE_OTHER): Payer: Medicare Other

## 2017-11-16 DIAGNOSIS — K5790 Diverticulosis of intestine, part unspecified, without perforation or abscess without bleeding: Secondary | ICD-10-CM | POA: Diagnosis not present

## 2017-11-16 DIAGNOSIS — R300 Dysuria: Secondary | ICD-10-CM | POA: Diagnosis not present

## 2017-11-16 DIAGNOSIS — D509 Iron deficiency anemia, unspecified: Secondary | ICD-10-CM | POA: Diagnosis not present

## 2017-11-16 DIAGNOSIS — M545 Low back pain: Secondary | ICD-10-CM | POA: Diagnosis not present

## 2017-11-16 DIAGNOSIS — R269 Unspecified abnormalities of gait and mobility: Secondary | ICD-10-CM | POA: Diagnosis not present

## 2017-11-16 DIAGNOSIS — I10 Essential (primary) hypertension: Secondary | ICD-10-CM | POA: Diagnosis not present

## 2017-11-16 DIAGNOSIS — G500 Trigeminal neuralgia: Secondary | ICD-10-CM | POA: Diagnosis not present

## 2017-11-16 LAB — URINALYSIS, ROUTINE W REFLEX MICROSCOPIC
Bilirubin Urine: NEGATIVE
Hgb urine dipstick: NEGATIVE
KETONES UR: NEGATIVE
Nitrite: NEGATIVE
PH: 7 (ref 5.0–8.0)
RBC / HPF: NONE SEEN (ref 0–?)
SPECIFIC GRAVITY, URINE: 1.02 (ref 1.000–1.030)
TOTAL PROTEIN, URINE-UPE24: NEGATIVE
URINE GLUCOSE: NEGATIVE
UROBILINOGEN UA: 0.2 (ref 0.0–1.0)

## 2017-11-17 LAB — URINE CULTURE
MICRO NUMBER:: 90072254
SPECIMEN QUALITY: ADEQUATE

## 2017-11-19 ENCOUNTER — Encounter: Payer: Self-pay | Admitting: Internal Medicine

## 2017-11-20 ENCOUNTER — Ambulatory Visit: Payer: Medicare Other | Admitting: Gastroenterology

## 2017-11-20 DIAGNOSIS — R269 Unspecified abnormalities of gait and mobility: Secondary | ICD-10-CM | POA: Diagnosis not present

## 2017-11-20 DIAGNOSIS — M545 Low back pain: Secondary | ICD-10-CM | POA: Diagnosis not present

## 2017-11-20 DIAGNOSIS — I10 Essential (primary) hypertension: Secondary | ICD-10-CM | POA: Diagnosis not present

## 2017-11-20 DIAGNOSIS — G500 Trigeminal neuralgia: Secondary | ICD-10-CM | POA: Diagnosis not present

## 2017-11-20 DIAGNOSIS — D509 Iron deficiency anemia, unspecified: Secondary | ICD-10-CM | POA: Diagnosis not present

## 2017-11-20 DIAGNOSIS — K5790 Diverticulosis of intestine, part unspecified, without perforation or abscess without bleeding: Secondary | ICD-10-CM | POA: Diagnosis not present

## 2017-11-22 ENCOUNTER — Encounter: Payer: Self-pay | Admitting: Internal Medicine

## 2017-11-22 ENCOUNTER — Ambulatory Visit (INDEPENDENT_AMBULATORY_CARE_PROVIDER_SITE_OTHER): Payer: Medicare Other | Admitting: Internal Medicine

## 2017-11-22 DIAGNOSIS — I10 Essential (primary) hypertension: Secondary | ICD-10-CM

## 2017-11-22 DIAGNOSIS — M5136 Other intervertebral disc degeneration, lumbar region: Secondary | ICD-10-CM

## 2017-11-22 DIAGNOSIS — E7800 Pure hypercholesterolemia, unspecified: Secondary | ICD-10-CM | POA: Diagnosis not present

## 2017-11-22 DIAGNOSIS — K5720 Diverticulitis of large intestine with perforation and abscess without bleeding: Secondary | ICD-10-CM | POA: Diagnosis not present

## 2017-11-22 DIAGNOSIS — D649 Anemia, unspecified: Secondary | ICD-10-CM

## 2017-11-22 LAB — HEPATIC FUNCTION PANEL
ALK PHOS: 61 U/L (ref 39–117)
ALT: 15 U/L (ref 0–35)
AST: 13 U/L (ref 0–37)
Albumin: 4 g/dL (ref 3.5–5.2)
BILIRUBIN DIRECT: 0.1 mg/dL (ref 0.0–0.3)
BILIRUBIN TOTAL: 0.4 mg/dL (ref 0.2–1.2)
Total Protein: 7.3 g/dL (ref 6.0–8.3)

## 2017-11-22 LAB — LIPID PANEL
Cholesterol: 206 mg/dL — ABNORMAL HIGH (ref 0–200)
HDL: 74 mg/dL (ref >39.00)
LDL CALC: 112 mg/dL — AB (ref 0–99)
NonHDL: 132.14
TRIGLYCERIDES: 101 mg/dL (ref 0.0–149.0)
Total CHOL/HDL Ratio: 3
VLDL: 20.2 mg/dL (ref 0.0–40.0)

## 2017-11-22 LAB — BASIC METABOLIC PANEL
BUN: 29 mg/dL — AB (ref 6–23)
CALCIUM: 9.5 mg/dL (ref 8.4–10.5)
CO2: 24 mEq/L (ref 19–32)
CREATININE: 0.83 mg/dL (ref 0.40–1.20)
Chloride: 103 mEq/L (ref 96–112)
GFR: 70.05 mL/min (ref >60.00)
Glucose, Bld: 99 mg/dL (ref 70–99)
Potassium: 4.6 mEq/L (ref 3.5–5.1)
Sodium: 138 mEq/L (ref 135–145)

## 2017-11-22 LAB — CBC WITH DIFFERENTIAL/PLATELET
BASOS PCT: 0.4 % (ref 0.0–3.0)
Basophils Absolute: 0 10*3/uL (ref 0.0–0.1)
Eosinophils Absolute: 0.1 10*3/uL (ref 0.0–0.7)
Eosinophils Relative: 0.9 % (ref 0.0–5.0)
HCT: 33.3 % — ABNORMAL LOW (ref 36.0–46.0)
Hemoglobin: 10.2 g/dL — ABNORMAL LOW (ref 12.0–15.0)
LYMPHS ABS: 1.3 10*3/uL (ref 0.7–4.0)
Lymphocytes Relative: 12.7 % (ref 12.0–46.0)
MCHC: 30.5 g/dL (ref 30.0–36.0)
MCV: 79.2 fl (ref 78.0–100.0)
MONO ABS: 1.1 10*3/uL — AB (ref 0.1–1.0)
Monocytes Relative: 10 % (ref 3.0–12.0)
NEUTROS ABS: 8.1 10*3/uL — AB (ref 1.4–7.7)
NEUTROS PCT: 76 % (ref 43.0–77.0)
PLATELETS: 400 10*3/uL (ref 150.0–400.0)
RBC: 4.21 Mil/uL (ref 3.87–5.11)
RDW: 17.8 % — AB (ref 11.5–15.5)
WBC: 10.6 10*3/uL — ABNORMAL HIGH (ref 4.0–10.5)

## 2017-11-22 LAB — FERRITIN: FERRITIN: 8.2 ng/mL — AB (ref 10.0–291.0)

## 2017-11-22 NOTE — Progress Notes (Signed)
Patient ID: Denise Barry, female   DOB: 1933-11-10, 84 y.o.   MRN: [id number]   Subjective:    Patient ID: Denise Barry, female    DOB: 1934/04/08, 84 y.o.   MRN: [id number]  HPI  Patient here for a scheduled follow up.  She was admitted on 10/05/17 with acute sigmoid diverticulitis with perforation along with anemia.  hgb 7.5.  Treated with IV fluids and abx.  During admission, hgb decreased to 5.8 with low iron.  Recommended CT scan to show resolution prior to EGD/colonoscopy.  CT scheduled.  She reports she is doing better.  No abdominal pain.  Bowels are moving.  Finishing up with physical therapy.  No chest pain.  No sob.  Seeing pain clinic for her back.    Past Medical History:  Diagnosis Date  . Allergy   . Diverticulitis    11/18  . Hypercholesterolemia   . Hypertension   . Osteoarthritis   . Urinary incontinence    pessary in place  . Varicose veins    H/O   Past Surgical History:  Procedure Laterality Date  . BACK SURGERY  09/16/13  . DILATION AND CURETTAGE OF UTERUS  1971  . JOINT REPLACEMENT    . sclerosis  2000  . TUBAL LIGATION  1972   Family History  Problem Relation Age of Onset  . Heart disease Mother   . Diabetes Mother   . Hypertension Mother   . Heart disease Father   . Stroke Father   . Cancer Son        bladder  . COPD Son   . Breast cancer Neg Hx    Social History   Socioeconomic History  . Marital status: Widowed    Spouse name: None  . Number of children: None  . Years of education: None  . Highest education level: None  Social Needs  . Financial resource strain: None  . Food insecurity - worry: None  . Food insecurity - inability: None  . Transportation needs - medical: None  . Transportation needs - non-medical: None  Occupational History  . None  Tobacco Use  . Smoking status: Former Research scientist (life sciences)  . Smokeless tobacco: Never Used  Substance and Sexual Activity  . Alcohol use: No    Alcohol/week: 0.0 oz  . Drug use:  No  . Sexual activity: No  Other Topics Concern  . None  Social History Narrative  . None    Outpatient Encounter Medications as of 11/22/2017  Medication Sig  . Acetaminophen (TYLENOL EXTRA STRENGTH PO) Take 1-2 tablets by mouth daily.  Marland Kitchen aspirin 81 MG tablet Take 81 mg by mouth daily.  . Eslicarbazepine Acetate 400 MG TABS Take 1 tablet by mouth 3 (three) times daily.  Marland Kitchen estradiol (ESTRACE VAGINAL) 0.1 MG/GM vaginal cream Apply PRN  . Fe Fum-FePoly-Vit C-Vit B3 (INTEGRA) 62.5-62.5-40-3 MG CAPS Take one capsule daily.  . fexofenadine (ALLEGRA) 180 MG tablet Take 180 mg by mouth as needed for allergies or rhinitis.  . Multiple Vitamin (MULTI-VITAMINS) TABS Take 1 tablet by mouth.   Marland Kitchen tiZANidine (ZANAFLEX) 4 MG tablet Take 1 tablet (4 mg total) 3 (three) times daily as needed by mouth for muscle spasms.  . traMADol (ULTRAM) 50 MG tablet Take 1 tablet (50 mg total) by mouth 2 (two) times daily.  . [DISCONTINUED] amLODipine (NORVASC) 10 MG tablet Take 1 tablet (10 mg total) by mouth daily.  . ferrous sulfate 325 (65 FE) MG tablet Take 1 tablet (  325 mg total) by mouth daily with breakfast. (Patient not taking: Reported on 11/22/2017)   No facility-administered encounter medications on file as of 11/22/2017.     Review of Systems  Constitutional: Negative for appetite change and unexpected weight change.  HENT: Negative for congestion and sinus pressure.   Respiratory: Negative for cough, chest tightness and shortness of breath.   Cardiovascular: Negative for chest pain, palpitations and leg swelling.  Gastrointestinal: Negative for abdominal pain, diarrhea, nausea and vomiting.  Genitourinary: Negative for difficulty urinating and dysuria.  Musculoskeletal: Positive for back pain. Negative for myalgias.  Skin: Negative for color change and rash.  Neurological: Negative for dizziness, light-headedness and headaches.  Psychiatric/Behavioral: Negative for agitation and dysphoric mood.        Objective:    Physical Exam  Constitutional: She appears well-developed and well-nourished. No distress.  HENT:  Nose: Nose normal.  Mouth/Throat: Oropharynx is clear and moist.  Neck: Neck supple. No thyromegaly present.  Cardiovascular: Normal rate and regular rhythm.  Pulmonary/Chest: Breath sounds normal. No respiratory distress. She has no wheezes.  Abdominal: Soft. Bowel sounds are normal. There is no tenderness.  Musculoskeletal: She exhibits no edema or tenderness.  Lymphadenopathy:    She has no cervical adenopathy.  Skin: No rash noted. No erythema.  Psychiatric: She has a normal mood and affect. Her behavior is normal.    BP 126/72 (BP Location: Left Arm, Patient Position: Sitting, Cuff Size: Normal)   Pulse 89   Temp 97.7 F (36.5 C) (Oral)   Resp 16   Wt 144 lb 9.6 oz (65.6 kg)   SpO2 98%   BMI 24.06 kg/m  Wt Readings from Last 3 Encounters:  11/22/17 144 lb 9.6 oz (65.6 kg)  11/14/17 155 lb (70.3 kg)  11/06/17 151 lb 12.8 oz (68.9 kg)     Lab Results  Component Value Date   WBC 10.6 (H) 11/22/2017   HGB 10.2 (L) 11/22/2017   HCT 33.3 (L) 11/22/2017   PLT 400.0 11/22/2017   GLUCOSE 99 11/22/2017   CHOL 206 (H) 11/22/2017   TRIG 101.0 11/22/2017   HDL 74.00 11/22/2017   LDLCALC 112 (H) 11/22/2017   ALT 15 11/22/2017   AST 13 11/22/2017   NA 138 11/22/2017   K 4.6 11/22/2017   CL 103 11/22/2017   CREATININE 0.83 11/22/2017   BUN 29 (H) 11/22/2017   CO2 24 11/22/2017   TSH 2.21 07/05/2017   INR 1.0 02/03/2015    Dg C-arm 1-60 Min-no Report  Result Date: 11/14/2017 Fluoroscopy was utilized by the requesting physician.  No radiographic interpretation.       Assessment & Plan:   Problem List Items Addressed This Visit    Anemia    hgb 5.8 in hospital.  Treated for diverticulitis.  S/p transfusion.  Saw GI.  Follow cbc/ferritin.       DDD (degenerative disc disease), lumbar (Chronic)    Persistent pain.  Seeing pain clinic.         Diverticulitis of large intestine with perforation    Recently admitted with diverticulitis with perforation.  Treated with IV fluids and abx.  Doing better.  Seeing GI.  Planning for f/u CT to confirm cleared.  Plans for colonoscopy and EGD after scan.        Essential hypertension, benign    Blood pressure under good control.  Continue same medication regimen.  Follow pressures.  Follow metabolic panel.        Hypercholesterolemia  Follow lipid panel.           Einar Pheasant, MD

## 2017-11-23 ENCOUNTER — Encounter: Payer: Self-pay | Admitting: *Deleted

## 2017-11-23 DIAGNOSIS — R269 Unspecified abnormalities of gait and mobility: Secondary | ICD-10-CM | POA: Diagnosis not present

## 2017-11-23 DIAGNOSIS — I10 Essential (primary) hypertension: Secondary | ICD-10-CM | POA: Diagnosis not present

## 2017-11-23 DIAGNOSIS — D509 Iron deficiency anemia, unspecified: Secondary | ICD-10-CM | POA: Diagnosis not present

## 2017-11-23 DIAGNOSIS — M545 Low back pain: Secondary | ICD-10-CM | POA: Diagnosis not present

## 2017-11-23 DIAGNOSIS — G500 Trigeminal neuralgia: Secondary | ICD-10-CM | POA: Diagnosis not present

## 2017-11-23 DIAGNOSIS — K5790 Diverticulosis of intestine, part unspecified, without perforation or abscess without bleeding: Secondary | ICD-10-CM | POA: Diagnosis not present

## 2017-11-24 ENCOUNTER — Other Ambulatory Visit: Payer: Self-pay

## 2017-11-24 MED ORDER — AMLODIPINE BESYLATE 10 MG PO TABS: 10.0000 mg | ORAL_TABLET | Freq: Every day | ORAL | 3 refills | Status: AC

## 2017-11-25 ENCOUNTER — Encounter: Payer: Self-pay | Admitting: Internal Medicine

## 2017-11-25 NOTE — Assessment & Plan Note (Signed)
hgb 5.8 in hospital.  Treated for diverticulitis.  S/p transfusion.  Saw GI.  Follow cbc/ferritin.

## 2017-11-25 NOTE — Assessment & Plan Note (Signed)
Recently admitted with diverticulitis with perforation.  Treated with IV fluids and abx.  Doing better.  Seeing GI.  Planning for f/u CT to confirm cleared.  Plans for colonoscopy and EGD after scan.

## 2017-11-25 NOTE — Assessment & Plan Note (Signed)
Follow lipid panel.   

## 2017-11-25 NOTE — Assessment & Plan Note (Signed)
Persistent pain.  Seeing pain clinic.

## 2017-11-25 NOTE — Assessment & Plan Note (Signed)
Blood pressure under good control.  Continue same medication regimen.  Follow pressures.  Follow metabolic panel.   

## 2017-11-27 ENCOUNTER — Ambulatory Visit: Payer: Medicare Other | Admitting: Internal Medicine

## 2017-11-27 ENCOUNTER — Ambulatory Visit: Payer: Medicare Other

## 2017-11-27 NOTE — Telephone Encounter (Signed)
Unread mychart message mailed to patient 

## 2017-11-28 DIAGNOSIS — R269 Unspecified abnormalities of gait and mobility: Secondary | ICD-10-CM | POA: Diagnosis not present

## 2017-11-28 DIAGNOSIS — M545 Low back pain: Secondary | ICD-10-CM | POA: Diagnosis not present

## 2017-11-28 DIAGNOSIS — D509 Iron deficiency anemia, unspecified: Secondary | ICD-10-CM | POA: Diagnosis not present

## 2017-11-28 DIAGNOSIS — K5790 Diverticulosis of intestine, part unspecified, without perforation or abscess without bleeding: Secondary | ICD-10-CM | POA: Diagnosis not present

## 2017-11-28 DIAGNOSIS — G500 Trigeminal neuralgia: Secondary | ICD-10-CM | POA: Diagnosis not present

## 2017-11-28 DIAGNOSIS — I10 Essential (primary) hypertension: Secondary | ICD-10-CM | POA: Diagnosis not present

## 2017-11-29 ENCOUNTER — Ambulatory Visit: Payer: Medicare Other | Attending: Pain Medicine | Admitting: Pain Medicine

## 2017-11-29 ENCOUNTER — Encounter: Payer: Self-pay | Admitting: Pain Medicine

## 2017-11-29 VITALS — BP 168/91 | HR 96 | Temp 98.4°F | Resp 16 | Ht 65.0 in | Wt 155.0 lb

## 2017-11-29 DIAGNOSIS — M545 Low back pain: Secondary | ICD-10-CM

## 2017-11-29 DIAGNOSIS — Z825 Family history of asthma and other chronic lower respiratory diseases: Secondary | ICD-10-CM | POA: Diagnosis not present

## 2017-11-29 DIAGNOSIS — M7989 Other specified soft tissue disorders: Secondary | ICD-10-CM | POA: Diagnosis not present

## 2017-11-29 DIAGNOSIS — G8929 Other chronic pain: Secondary | ICD-10-CM

## 2017-11-29 DIAGNOSIS — M48061 Spinal stenosis, lumbar region without neurogenic claudication: Secondary | ICD-10-CM | POA: Insufficient documentation

## 2017-11-29 DIAGNOSIS — G500 Trigeminal neuralgia: Secondary | ICD-10-CM | POA: Insufficient documentation

## 2017-11-29 DIAGNOSIS — Z823 Family history of stroke: Secondary | ICD-10-CM | POA: Insufficient documentation

## 2017-11-29 DIAGNOSIS — M47816 Spondylosis without myelopathy or radiculopathy, lumbar region: Secondary | ICD-10-CM | POA: Diagnosis not present

## 2017-11-29 DIAGNOSIS — M961 Postlaminectomy syndrome, not elsewhere classified: Secondary | ICD-10-CM | POA: Diagnosis not present

## 2017-11-29 DIAGNOSIS — Z7982 Long term (current) use of aspirin: Secondary | ICD-10-CM | POA: Insufficient documentation

## 2017-11-29 DIAGNOSIS — E7800 Pure hypercholesterolemia, unspecified: Secondary | ICD-10-CM | POA: Diagnosis not present

## 2017-11-29 DIAGNOSIS — E871 Hypo-osmolality and hyponatremia: Secondary | ICD-10-CM | POA: Insufficient documentation

## 2017-11-29 DIAGNOSIS — R32 Unspecified urinary incontinence: Secondary | ICD-10-CM | POA: Insufficient documentation

## 2017-11-29 DIAGNOSIS — K5732 Diverticulitis of large intestine without perforation or abscess without bleeding: Secondary | ICD-10-CM | POA: Diagnosis not present

## 2017-11-29 DIAGNOSIS — Z79899 Other long term (current) drug therapy: Secondary | ICD-10-CM | POA: Insufficient documentation

## 2017-11-29 DIAGNOSIS — Z885 Allergy status to narcotic agent status: Secondary | ICD-10-CM | POA: Insufficient documentation

## 2017-11-29 DIAGNOSIS — Z833 Family history of diabetes mellitus: Secondary | ICD-10-CM | POA: Insufficient documentation

## 2017-11-29 DIAGNOSIS — Z888 Allergy status to other drugs, medicaments and biological substances status: Secondary | ICD-10-CM | POA: Diagnosis not present

## 2017-11-29 DIAGNOSIS — G894 Chronic pain syndrome: Secondary | ICD-10-CM | POA: Insufficient documentation

## 2017-11-29 DIAGNOSIS — M419 Scoliosis, unspecified: Secondary | ICD-10-CM | POA: Diagnosis not present

## 2017-11-29 DIAGNOSIS — D649 Anemia, unspecified: Secondary | ICD-10-CM | POA: Diagnosis not present

## 2017-11-29 DIAGNOSIS — M5136 Other intervertebral disc degeneration, lumbar region: Secondary | ICD-10-CM

## 2017-11-29 DIAGNOSIS — M533 Sacrococcygeal disorders, not elsewhere classified: Secondary | ICD-10-CM | POA: Insufficient documentation

## 2017-11-29 DIAGNOSIS — R55 Syncope and collapse: Secondary | ICD-10-CM | POA: Insufficient documentation

## 2017-11-29 DIAGNOSIS — S32029A Unspecified fracture of second lumbar vertebra, initial encounter for closed fracture: Secondary | ICD-10-CM | POA: Diagnosis not present

## 2017-11-29 DIAGNOSIS — Z87891 Personal history of nicotine dependence: Secondary | ICD-10-CM | POA: Insufficient documentation

## 2017-11-29 DIAGNOSIS — I10 Essential (primary) hypertension: Secondary | ICD-10-CM | POA: Diagnosis not present

## 2017-11-29 DIAGNOSIS — Z8249 Family history of ischemic heart disease and other diseases of the circulatory system: Secondary | ICD-10-CM | POA: Insufficient documentation

## 2017-11-29 DIAGNOSIS — Z9889 Other specified postprocedural states: Secondary | ICD-10-CM | POA: Insufficient documentation

## 2017-11-29 DIAGNOSIS — Z9660 Presence of unspecified orthopedic joint implant: Secondary | ICD-10-CM | POA: Insufficient documentation

## 2017-11-29 DIAGNOSIS — M7918 Myalgia, other site: Secondary | ICD-10-CM | POA: Diagnosis not present

## 2017-11-29 NOTE — Progress Notes (Signed)
Patient's Name: Denise Barry  MRN: [id number]  Referring Provider: Einar Pheasant, MD  DOB: 06-13-1934  PCP: Denise Pheasant, MD  DOS: 11/29/2017  Note by: Denise Cola, MD  Service setting: Ambulatory outpatient  Specialty: Interventional Pain Management  Location: [facility] (AMB) Pain Management Facility    Patient type: Established   Primary Reason(s) for Visit: Encounter for prescription drug management & post-procedure evaluation of chronic illness with mild to moderate exacerbation(Level of risk: moderate) CC: Back Pain (lower bilateral)  HPI  [name] is a 84 y.o. year old, female patient, who comes today for a post-procedure evaluation and medication management. 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); Urinary incontinence; History of hip fracture (Left); Right leg swelling; Health care maintenance; Hyponatremia; History of shoulder fracture (Left); Syncope; Chronic low back pain (Primary Area of Pain)  (Bilateral) (L>R); Chronic pain syndrome; Opiate use; Other long term (current) drug therapy; Other specified health status; Other reduced mobility; Disorder of bone, unspecified; Acquired spondylolisthesis; Closed 2-part displaced fracture of surgical neck of left humerus; DDD (degenerative disc disease), lumbar; Spinal stenosis, unspecified region other than cervical; SUNCT (short unilateral neuralgiform headache, conjunctival inj/tear); Anemia; Old Compression fracture of L1 and L2; DDD (degenerative disc disease), thoracic; Scoliosis; Abnormal MRI, lumbar spine (07/25/2017); Chronic musculoskeletal pain; Lumbar Spondylosis; Lumbar facet syndrome (Bilateral) (L>R); Chronic sacroiliac joint pain (Bilateral) (L>R); Abdominal pain; Elevated WBC count; Granulocytosis; Diverticulitis of large intestine with perforation; Diverticulitis; and Difficulty sleeping on their problem list. Her  primarily concern today is the Back Pain (lower bilateral)  Pain Assessment: Location: Lower, Left, Right Back Radiating: NA Onset: More than a month ago Duration: Chronic pain Quality: Spasm, Discomfort Severity: 3 /10 (self-reported pain score)  Note: Reported level is compatible with observation.                         When using our objective Pain Scale, levels between 6 and 10/10 are said to belong in an emergency room, as it progressively worsens from a 6/10, described as severely limiting, requiring emergency care not usually available at an outpatient pain management facility. At a 6/10 level, communication becomes difficult and requires great effort. Assistance to reach the emergency department may be required. Facial flushing and profuse sweating along with potentially dangerous increases in heart rate and blood pressure will be evident. Effect on ADL: patient has a therapist coming in helping here with exercises for back to increase mobility Timing: Intermittent Modifying factors: getting up and stretching or walking to work the pain out helps,  heating pad, procedure, ice after procedures.   [name] was last seen on 11/14/2017 for a procedure. During today's appointment we reviewed [name]'s post-procedure results, as well as her outpatient medication regimen.  Further details on both, my assessment(s), as well as the proposed treatment plan, please see below.  Post-Procedure Assessment  11/14/2017 Procedure: Diagnostic bilateral lumbar facet block #2 + diagnostic bilateral sacroiliac joint block #2 under fluoroscopic guidance and IV sedation Pre-procedure pain score:  3/10 Post-procedure pain score: 0/10 (100% relief) Influential Factors: BMI: 25.79 kg/m Intra-procedural challenges: None observed.         Assessment challenges: None detected.              Reported side-effects: None.        Post-procedural adverse reactions or complications: None reported          Sedation: Sedation provided.  When no sedatives are used, the analgesic levels obtained are directly associated to the effectiveness of the local anesthetics. However, when sedation is provided, the level of analgesia obtained during the initial 1 hour following the intervention, is believed to be the result of a combination of factors. These factors may include, but are not limited to: 1. The effectiveness of the local anesthetics used. 2. The effects of the analgesic(s) and/or anxiolytic(s) used. 3. The degree of discomfort experienced by the patient at the time of the procedure. 4. The patients ability and reliability in recalling and recording the events. 5. The presence and influence of possible secondary gains and/or psychosocial factors. Reported result: Relief experienced during the 1st hour after the procedure: 100 % (Ultra-Short Term Relief)            Interpretative annotation: Clinically appropriate result. Analgesia during this period is likely to be Local Anesthetic and/or IV Sedative (Analgesic/Anxiolytic) related.          Effects of local anesthetic: The analgesic effects attained during this period are directly associated to the localized infiltration of local anesthetics and therefore cary significant diagnostic value as to the etiological location, or anatomical origin, of the pain. Expected duration of relief is directly dependent on the pharmacodynamics of the local anesthetic used. Long-acting (4-6 hours) anesthetics used.  Reported result: Relief during the next 4 to 6 hour after the procedure: 80 % (Short-Term Relief)            Interpretative annotation: Clinically appropriate result. Analgesia during this period is likely to be Local Anesthetic-related.          Long-term benefit: Defined as the period of time past the expected duration of local anesthetics (1 hour for short-acting and 4-6 hours for long-acting). With the possible exception of prolonged sympathetic blockade  from the local anesthetics, benefits during this period are typically attributed to, or associated with, other factors such as analgesic sensory neuropraxia, antiinflammatory effects, or beneficial biochemical changes provided by agents other than the local anesthetics.  Reported result: Extended relief following procedure: 100 %(increased pain after activity) (Long-Term Relief)            Interpretative annotation: Clinically appropriate result. Good relief. No permanent benefit expected. Inflammation plays a part in the etiology to the pain.          Current benefits: Defined as reported results that persistent at this point in time.   Analgesia: 75-100 % Ms. Thornsberry reports improvement of axial symptoms. Function: Somewhat improved ROM: Somewhat improved Interpretative annotation: Ongoing benefit. No permanent benefit expected. Effective therapeutic approach. Benefit could be steroid-related.  Interpretation: Results would suggest a successful palliative intervention. The patient has failed to respond to conservative therapies including over-the-counter medications, anti-inflammatories, muscle relaxants, membrane stabilizers, opioids, physical therapy modalities such as heat and ice, as well as more invasive techniques such as nerve blocks. Because Ms. Bouie did attain more than 50% relief of the pain during a series of diagnostic blocks conducted in separate occasions, I believe it is medically necessary to proceed with Radiofrequency Ablation, in order to attempt gaining longer relief. Ms. Char indicates having had an unsuccessful trial of physical therapy, which she described as non-beneficial and painful.  Plan:  Proceed with Radiofrequency Ablation for the purpose of attaining long-term benefits.        Laboratory Chemistry  Inflammation Markers (CRP: Acute Phase) (ESR: Chronic Phase) Lab Results  Component Value Date   CRP 1.1 08/28/2017   ESRSEDRATE 5  08/28/2017   LATICACIDVEN  1.3 02/04/2015                 Renal Function Markers Lab Results  Component Value Date   BUN 29 (H) 11/22/2017   CREATININE 0.83 11/22/2017   GFRAA >60 10/06/2017   GFRNONAA 59 (L) 10/06/2017                 Hepatic Function Markers Lab Results  Component Value Date   AST 13 11/22/2017   ALT 15 11/22/2017   ALBUMIN 4.0 11/22/2017   ALKPHOS 61 11/22/2017   LIPASE 80 (H) 02/03/2015                 Electrolytes Lab Results  Component Value Date   NA 138 11/22/2017   K 4.6 11/22/2017   CL 103 11/22/2017   CALCIUM 9.5 11/22/2017   MG 2.2 10/06/2017   PHOS 4.1 10/06/2017                 Bone Pathology Markers Lab Results  Component Value Date   25OHVITD1 47 08/28/2017   25OHVITD2 <1.0 08/28/2017   25OHVITD3 47 08/28/2017                 Coagulation Parameters Lab Results  Component Value Date   INR 1.0 02/03/2015   LABPROT 13.3 02/03/2015   APTT 26.6 01/12/2014   PLT 400.0 11/22/2017                 Cardiovascular Markers Lab Results  Component Value Date   TROPONINI <0.03 02/03/2015   HGB 10.2 (L) 11/22/2017   HCT 33.3 (L) 11/22/2017                Note: Lab results reviewed.  Recent Diagnostic Imaging Results  DG C-Arm 1-60 Min-No Report Fluoroscopy was utilized by the requesting physician.  No radiographic  interpretation.   Complexity Note: Imaging results reviewed. Results shared with Ms. Stines, using [employer].                        Meds   Current Outpatient Medications:  .  Acetaminophen (TYLENOL EXTRA STRENGTH PO), Take 1-2 tablets by mouth daily., Disp: , Rfl:  .  amLODipine (NORVASC) 10 MG tablet, Take 1 tablet (10 mg total) by mouth daily., Disp: 30 tablet, Rfl: 3 .  aspirin 81 MG tablet, Take 81 mg by mouth daily., Disp: , Rfl:  .  Eslicarbazepine Acetate 400 MG TABS, Take 1 tablet by mouth 3 (three) times daily., Disp: , Rfl:  .  estradiol (ESTRACE VAGINAL) 0.1 MG/GM vaginal cream, Apply PRN, Disp: , Rfl:  .  Fe Fum-FePoly-Vit  C-Vit B3 (INTEGRA) 62.5-62.5-40-3 MG CAPS, Take one capsule daily., Disp: 30 capsule, Rfl: 1 .  fexofenadine (ALLEGRA) 180 MG tablet, Take 180 mg by mouth as needed for allergies or rhinitis., Disp: , Rfl:  .  Multiple Vitamin (MULTI-VITAMINS) TABS, Take 1 tablet by mouth. , Disp: , Rfl:  .  tiZANidine (ZANAFLEX) 4 MG tablet, Take 1 tablet (4 mg total) 3 (three) times daily as needed by mouth for muscle spasms., Disp: 90 tablet, Rfl: 0  ROS  Constitutional: Denies any fever or chills Gastrointestinal: No reported hemesis, hematochezia, vomiting, or acute GI distress Musculoskeletal: Denies any acute onset joint swelling, redness, loss of ROM, or weakness Neurological: No reported episodes of acute onset apraxia, aphasia, dysarthria, agnosia, amnesia, paralysis, loss of coordination, or loss of consciousness  Allergies  Ms. Newville is allergic to codeine; cyclobenzaprine; methocarbamol; carbamazepine; and gabapentin.  [place]  Drug: Ms. Panebianco  reports that she does not use drugs. Alcohol:  reports that she does not drink alcohol. Tobacco:  reports that she has quit smoking. she has never used smokeless tobacco. Medical:  has a past medical history of Allergy, Diverticulitis, Hypercholesterolemia, Hypertension, Osteoarthritis, Urinary incontinence, and Varicose veins. Surgical: Ms. Goon  has a past surgical history that includes Back surgery (09/16/13); sclerosis (2000); Dilation and curettage of uterus (1971); Tubal ligation (1972); and Joint replacement. Family: family history includes COPD in her son; Cancer in her son; Diabetes in her mother; Heart disease in her father and mother; Hypertension in her mother; Stroke in her father.  Constitutional Exam  General appearance: Well nourished, well developed, and well hydrated. In no apparent acute distress Vitals:   11/29/17 1108  BP: (!) 168/91  Pulse: 96  Resp: 16  Temp: 98.4 F (36.9 C)  TempSrc: Oral  SpO2: 98%  Weight: 155 lb  (70.3 kg)  Height: '5\' 5"'$  (1.651 m)   BMI Assessment: Estimated body mass index is 25.79 kg/m as calculated from the following:   Height as of this encounter: '5\' 5"'$  (1.651 m).   Weight as of this encounter: 155 lb (70.3 kg).  BMI interpretation table: BMI level Category Range association with higher incidence of chronic pain  <18 kg/m2 Underweight   18.5-24.9 kg/m2 Ideal body weight   25-29.9 kg/m2 Overweight Increased incidence by 20%  30-34.9 kg/m2 Obese (Class I) Increased incidence by 68%  35-39.9 kg/m2 Severe obesity (Class II) Increased incidence by 136%  >40 kg/m2 Extreme obesity (Class III) Increased incidence by 254%   BMI Readings from Last 4 Encounters:  11/29/17 25.79 kg/m  11/22/17 24.06 kg/m  11/14/17 25.79 kg/m  11/06/17 24.88 kg/m   Wt Readings from Last 4 Encounters:  11/29/17 155 lb (70.3 kg)  11/22/17 144 lb 9.6 oz (65.6 kg)  11/14/17 155 lb (70.3 kg)  11/06/17 151 lb 12.8 oz (68.9 kg)  Psych/Mental status: Alert, oriented x 3 (person, place, & time)       Eyes: PERLA Respiratory: No evidence of acute respiratory distress  Cervical Spine Area Exam  Skin & Axial Inspection: No masses, redness, edema, swelling, or associated skin lesions Alignment: Symmetrical Functional ROM: Unrestricted ROM      Stability: No instability detected Muscle Tone/Strength: Functionally intact. No obvious neuro-muscular anomalies detected. Sensory (Neurological): Unimpaired Palpation: No palpable anomalies              Upper Extremity (UE) Exam    Side: Right upper extremity  Side: Left upper extremity  Skin & Extremity Inspection: Skin color, temperature, and hair growth are WNL. No peripheral edema or cyanosis. No masses, redness, swelling, asymmetry, or associated skin lesions. No contractures.  Skin & Extremity Inspection: Skin color, temperature, and hair growth are WNL. No peripheral edema or cyanosis. No masses, redness, swelling, asymmetry, or associated skin  lesions. No contractures.  Functional ROM: Unrestricted ROM          Functional ROM: Unrestricted ROM          Muscle Tone/Strength: Functionally intact. No obvious neuro-muscular anomalies detected.  Muscle Tone/Strength: Functionally intact. No obvious neuro-muscular anomalies detected.  Sensory (Neurological): Unimpaired          Sensory (Neurological): Unimpaired          Palpation: No palpable anomalies  Palpation: No palpable anomalies              Specialized Test(s): Deferred         Specialized Test(s): Deferred          Thoracic Spine Area Exam  Skin & Axial Inspection: No masses, redness, or swelling Alignment: Symmetrical Functional ROM: Unrestricted ROM Stability: No instability detected Muscle Tone/Strength: Functionally intact. No obvious neuro-muscular anomalies detected. Sensory (Neurological): Unimpaired Muscle strength & Tone: No palpable anomalies  Lumbar Spine Area Exam  Skin & Axial Inspection: No masses, redness, or swelling Alignment: Symmetrical Functional ROM: Decreased ROM      Stability: No instability detected Muscle Tone/Strength: Functionally intact. No obvious neuro-muscular anomalies detected. Sensory (Neurological): Movement-associated pain Palpation: Complains of area being tender to palpation       Provocative Tests: Lumbar Hyperextension and rotation test: Positive bilaterally for facet joint pain. Lumbar Lateral bending test: evaluation deferred today       Patrick's Maneuver: Positive for bilateral S-I arthralgia              Gait & Posture Assessment  Ambulation: Limited Gait: Relatively normal for age and body habitus Posture: Antalgic   Lower Extremity Exam    Side: Right lower extremity  Side: Left lower extremity  Skin & Extremity Inspection: Skin color, temperature, and hair growth are WNL. No peripheral edema or cyanosis. No masses, redness, swelling, asymmetry, or associated skin lesions. No contractures.  Skin & Extremity  Inspection: Skin color, temperature, and hair growth are WNL. No peripheral edema or cyanosis. No masses, redness, swelling, asymmetry, or associated skin lesions. No contractures.  Functional ROM: Unrestricted ROM          Functional ROM: Unrestricted ROM          Muscle Tone/Strength: Functionally intact. No obvious neuro-muscular anomalies detected.  Muscle Tone/Strength: Functionally intact. No obvious neuro-muscular anomalies detected.  Sensory (Neurological): Unimpaired  Sensory (Neurological): Unimpaired  Palpation: No palpable anomalies  Palpation: No palpable anomalies   Assessment  Primary Diagnosis & Pertinent Problem List: The primary encounter diagnosis was Chronic low back pain (Primary Area of Pain)  (Bilateral) (L>R). 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  Status Diagnosis  Improving Persistent Persistent 1. Chronic low back pain (Primary Area of Pain)  (Bilateral) (L>R)   2. Chronic sacroiliac joint pain (Bilateral) (L>R)   3. Lumbar facet syndrome (Bilateral) (L>R)   4. DDD (degenerative disc disease), lumbar   5. Lumbar Spondylosis   6. Failed back surgical syndrome (L4-5 fusion)     Problems updated and reviewed during this visit: No problems updated. Plan of Care  Pharmacotherapy (Medications Ordered): No orders of the defined types were placed in this encounter.  Medications administered today: Adelia Baptista. Camposano had no medications administered during this visit.   Procedure Orders     Radiofrequency,Lumbar     LUMBAR FACET(MEDIAL BRANCH NERVE BLOCK) MBNB     SACROILIAC JOINT INJECTION     Radiofrequency Sacroiliac Joint Lab Orders  No laboratory test(s) ordered today   Imaging Orders  No imaging studies ordered today   Referral Orders  No  referral(s) requested today    Interventional management options: Planned, scheduled, and/or pending:   Therapeutic left-sided lumbar facet + sacroiliac joint radiofrequency ablation under fluoroscopic guidance and IV sedation.  We plan to do both  sides, but we will start with the left. If by any chance it takes long to get the approval for the radiofrequency, I have also put an order for a palliative bilateral lumbar facet block + bilateral sacroiliac joint block under fluoroscopic guidance and IV sedation   Considering:   Diagnostic bilateral lumbar facet block Possible bilateral lumbar facet RFA Diagnostic bilateral sacroiliac joint block Possible bilateral sacroiliac joint RFA Diagnostic caudal epidural steroid injection plus diagnostic epidurogram Possible Racz procedure Diagnostic left sided L2-3 interlaminar lumbar epidural steroid injection   Palliative PRN treatment(s):   Palliative bilateral lumbar facet block + bilateral sacroiliac joint block    Provider-requested follow-up: Return for RFA (fluoro + sedation): (L) L-FCT + (L) SI RFA, PRN Procedure.  Future Appointments  Date Time Provider [place]  01/03/2018 11:30 AM [facility]  01/09/2018 10:30 AM Milinda Pointer, MD [facility] None  01/15/2018  9:40 AM [facility] DEXA [facility]  02/23/2018  9:15 AM [facility]  02/26/2018 11:00 AM Denise Pheasant, MD [facility]  11/06/2018  1:00 PM O'Brien-Blaney, Bryson Corona, LPN [facility]   Primary Care Physician: Denise Pheasant, MD Location: [facility] Note by: Denise Cola, MD Date: 11/29/2017; Time: 12:25 PM

## 2017-11-29 NOTE — Progress Notes (Signed)
Safety precautions to be maintained throughout the outpatient stay will include: orient to surroundings, keep bed in low position, maintain call bell within reach at all times, provide assistance with transfer out of bed and ambulation.  

## 2017-11-29 NOTE — Patient Instructions (Addendum)
____________________________________________________________________________________________  Pain Prevention Technique  Definition:   A technique used to minimize the effects of an activity known to cause inflammation or swelling, which in turn leads to an increase in pain.  Purpose: To prevent swelling from occurring. It is based on the fact that it is easier to prevent swelling from happening than it is to get rid of it, once it occurs.  Contraindications: 1. Anyone with allergy or hypersensitivity to the recommended medications. 2. Anyone taking anticoagulants (Blood Thinners) (e.g., Coumadin, Warfarin, Plavix, etc.). 3. Patients in Renal Failure.  Technique: Before you undertake an activity known to cause pain, or a flare-up of your chronic pain, and before you experience any pain, do the following:  1. On a full stomach, take 4 (four) over the counter Ibuprofens 200mg  tablets (Motrin), for a total of 800 mg. 2. In addition, take over the counter Magnesium 400 to 500 mg, before doing the activity.  3. Six (6) hours later, again on a full stomach, repeat the Ibuprofen. 4. That night, take a warm shower and stretch under the running warm water.  This technique may be sufficient to abort the pain and discomfort before it happens. Keep in mind that it takes a lot less medication to prevent swelling than it takes to eliminate it once it occurs.  ____________________________________________________________________________________________  ____________________________________________________________________________________________  Preparing for Procedure with Sedation Instructions: . Oral Intake: Do not eat or drink anything for at least 8 hours prior to your procedure. . Transportation: Public transportation is not allowed. Bring an adult driver. The driver must be physically present in our waiting room before any procedure can be started. Marland Kitchen Physical Assistance: Bring an adult  physically capable of assisting you, in the event you need help. This adult should keep you company at home for at least 6 hours after the procedure. . Blood Pressure Medicine: Take your blood pressure medicine with a sip of water the morning of the procedure. . Blood thinners:  . Diabetics on insulin: Notify the staff so that you can be scheduled 1st case in the morning. If your diabetes requires high dose insulin, take only  of your normal insulin dose the morning of the procedure and notify the staff that you have done so. . Preventing infections: Shower with an antibacterial soap the morning of your procedure. . Build-up your immune system: Take 1000 mg of Vitamin C with every meal (3 times a day) the day prior to your procedure. Marland Kitchen Antibiotics: Inform the staff if you have a condition or reason that requires you to take antibiotics before dental procedures. . Pregnancy: If you are pregnant, call and cancel the procedure. . Sickness: If you have a cold, fever, or any active infections, call and cancel the procedure. . Arrival: You must be in the facility at least 30 minutes prior to your scheduled procedure. . Children: Do not bring children with you. . Dress appropriately: Bring dark clothing that you would not mind if they get stained. . Valuables: Do not bring any jewelry or valuables. Procedure appointments are reserved for interventional treatments only. Marland Kitchen No Prescription Refills. . No medication changes will be discussed during procedure appointments. . No disability issues will be discussed. ____________________________________________________________________________________________   Radiofrequency Lesioning Radiofrequency lesioning is a procedure that is performed to relieve pain. The procedure is often used for back, neck, or arm pain. Radiofrequency lesioning involves the use of a machine that creates radio waves to make heat. During the procedure, the heat is applied to the  nerve  that carries the pain signal. The heat damages the nerve and interferes with the pain signal. Pain relief usually starts about 2 weeks after the procedure and lasts for 6 months to 1 year. Tell a health care provider about:  Any allergies you have.  All medicines you are taking, including vitamins, herbs, eye drops, creams, and over-the-counter medicines.  Any problems you or family members have had with anesthetic medicines.  Any blood disorders you have.  Any surgeries you have had.  Any medical conditions you have.  Whether you are pregnant or may be pregnant. What are the risks? Generally, this is a safe procedure. However, problems may occur, including:  Pain or soreness at the injection site.  Infection at the injection site.  Damage to nerves or blood vessels.  What happens before the procedure?  Ask your health care provider about: ? Changing or stopping your regular medicines. This is especially important if you are taking diabetes medicines or blood thinners. ? Taking medicines such as aspirin and ibuprofen. These medicines can thin your blood. Do not take these medicines before your procedure if your health care provider instructs you not to.  Follow instructions from your health care provider about eating or drinking restrictions.  Plan to have someone take you home after the procedure.  If you go home right after the procedure, plan to have someone with you for 24 hours. What happens during the procedure?  You will be given one or more of the following: ? A medicine to help you relax (sedative). ? A medicine to numb the area (local anesthetic).  You will be awake during the procedure. You will need to be able to talk with the health care provider during the procedure.  With the help of a type of X-ray (fluoroscopy), the health care provider will insert a radiofrequency needle into the area to be treated.  Next, a wire that carries the radio waves (electrode)  will be put through the radiofrequency needle. An electrical pulse will be sent through the electrode to verify the correct nerve. You will feel a tingling sensation, and you may have muscle twitching.  Then, the tissue that is around the needle tip will be heated by an electric current that is passed using the radiofrequency machine. This will numb the nerves.  A bandage (dressing) will be put on the insertion area after the procedure is done. The procedure may vary among health care providers and hospitals. What happens after the procedure?  Your blood pressure, heart rate, breathing rate, and blood oxygen level will be monitored often until the medicines you were given have worn off.  Return to your normal activities as directed by your health care provider. This information is not intended to replace advice given to you by your health care provider. Make sure you discuss any questions you have with your health care provider. Document Released: 06/15/2011 Document Revised: 03/24/2016 Document Reviewed: 11/24/2014 Elsevier Interactive Patient Education  2018 [employer]. Pain Management Discharge Instructions  General Discharge Instructions :  If you need to reach your doctor call: Monday-Friday 8:00 am - 4:00 pm at [phone] or toll free [phone].  After clinic hours [phone] to have operator reach doctor.  Bring all of your medication bottles to all your appointments in the pain clinic.  To cancel or reschedule your appointment with Pain Management please remember to call 24 hours in advance to avoid a fee.  Refer to the educational materials which you have been  given on: General Risks, I had my Procedure. Discharge Instructions, Post Sedation.  Post Procedure Instructions:  The drugs you were given will stay in your system until tomorrow, so for the next 24 hours you should not drive, make any legal decisions or drink any alcoholic beverages.  You may eat anything  you prefer, but it is better to start with liquids then soups and crackers, and gradually work up to solid foods.  Please notify your doctor immediately if you have any unusual bleeding, trouble breathing or pain that is not related to your normal pain.  Depending on the type of procedure that was done, some parts of your body may feel week and/or numb.  This usually clears up by tonight or the next day.  Walk with the use of an assistive device or accompanied by an adult for the 24 hours.  You may use ice on the affected area for the first 24 hours.  Put ice in a Ziploc bag and cover with a towel and place against area 15 minutes on 15 minutes off.  You may switch to heat after 24 hours.

## 2017-11-30 DIAGNOSIS — R269 Unspecified abnormalities of gait and mobility: Secondary | ICD-10-CM | POA: Diagnosis not present

## 2017-11-30 DIAGNOSIS — M545 Low back pain: Secondary | ICD-10-CM | POA: Diagnosis not present

## 2017-11-30 DIAGNOSIS — G500 Trigeminal neuralgia: Secondary | ICD-10-CM | POA: Diagnosis not present

## 2017-11-30 DIAGNOSIS — D509 Iron deficiency anemia, unspecified: Secondary | ICD-10-CM | POA: Diagnosis not present

## 2017-11-30 DIAGNOSIS — I10 Essential (primary) hypertension: Secondary | ICD-10-CM | POA: Diagnosis not present

## 2017-11-30 DIAGNOSIS — K5790 Diverticulosis of intestine, part unspecified, without perforation or abscess without bleeding: Secondary | ICD-10-CM | POA: Diagnosis not present

## 2017-12-21 ENCOUNTER — Other Ambulatory Visit: Payer: Self-pay

## 2017-12-21 ENCOUNTER — Ambulatory Visit
Admission: RE | Admit: 2017-12-21 | Discharge: 2017-12-21 | Disposition: A | Discharge status: Home / Self Care | Payer: Medicare Other | Source: Ambulatory Visit | Attending: Pain Medicine | Admitting: Pain Medicine

## 2017-12-21 ENCOUNTER — Encounter: Payer: Self-pay | Admitting: Pain Medicine

## 2017-12-21 ENCOUNTER — Ambulatory Visit (HOSPITAL_BASED_OUTPATIENT_CLINIC_OR_DEPARTMENT_OTHER): Payer: Medicare Other | Admitting: Pain Medicine

## 2017-12-21 VITALS — BP 145/72 | HR 75 | Temp 97.1°F | Resp 16 | Ht 65.0 in | Wt 155.0 lb

## 2017-12-21 DIAGNOSIS — M533 Sacrococcygeal disorders, not elsewhere classified: Secondary | ICD-10-CM

## 2017-12-21 DIAGNOSIS — M47816 Spondylosis without myelopathy or radiculopathy, lumbar region: Secondary | ICD-10-CM | POA: Diagnosis not present

## 2017-12-21 DIAGNOSIS — M47817 Spondylosis without myelopathy or radiculopathy, lumbosacral region: Secondary | ICD-10-CM

## 2017-12-21 DIAGNOSIS — G8929 Other chronic pain: Secondary | ICD-10-CM | POA: Diagnosis not present

## 2017-12-21 DIAGNOSIS — M5388 Other specified dorsopathies, sacral and sacrococcygeal region: Secondary | ICD-10-CM | POA: Diagnosis not present

## 2017-12-21 MED ORDER — MIDAZOLAM HCL 5 MG/5ML IJ SOLN
1.0000 mg | INTRAMUSCULAR | Status: DC | PRN
  Administered 2017-12-21: 2 mg via INTRAVENOUS
  Filled 2017-12-21: qty 5

## 2017-12-21 MED ORDER — METHYLPREDNISOLONE ACETATE 80 MG/ML IJ SUSP
80.0000 mg | Freq: Once | INTRAMUSCULAR | Status: DC
  Filled 2017-12-21: qty 1

## 2017-12-21 MED ORDER — ROPIVACAINE HCL 2 MG/ML IJ SOLN
18.0000 mL | Freq: Once | INTRAMUSCULAR | Status: AC
  Administered 2017-12-21: 18 mL via PERINEURAL
  Filled 2017-12-21: qty 20

## 2017-12-21 MED ORDER — LIDOCAINE HCL 2 % IJ SOLN
20.0000 mL | Freq: Once | INTRAMUSCULAR | Status: AC
  Administered 2017-12-21: 20 mg

## 2017-12-21 MED ORDER — ROPIVACAINE HCL 2 MG/ML IJ SOLN
9.0000 mL | Freq: Once | INTRAMUSCULAR | Status: AC
  Administered 2017-12-21: 9 mL via INTRA_ARTICULAR
  Filled 2017-12-21: qty 10

## 2017-12-21 MED ORDER — LACTATED RINGERS IV SOLN
1000.0000 mL | Freq: Once | INTRAVENOUS | Status: AC
  Administered 2017-12-21: 1000 mL via INTRAVENOUS

## 2017-12-21 MED ORDER — FENTANYL CITRATE (PF) 100 MCG/2ML IJ SOLN
25.0000 ug | INTRAMUSCULAR | Status: DC | PRN
  Administered 2017-12-21: 50 ug via INTRAVENOUS
  Filled 2017-12-21: qty 2

## 2017-12-21 MED ORDER — TRIAMCINOLONE ACETONIDE 40 MG/ML IJ SUSP
80.0000 mg | Freq: Once | INTRAMUSCULAR | Status: AC
  Administered 2017-12-21: 80 mg
  Filled 2017-12-21: qty 2

## 2017-12-21 NOTE — Patient Instructions (Addendum)
____________________________________________________________________________________________  Post-Procedure instructions Instructions:  Apply ice: Fill a plastic sandwich bag with crushed ice. Cover it with a small towel and apply to injection site. Apply for 15 minutes then remove x 15 minutes. Repeat sequence on day of procedure, until you go to bed. The purpose is to minimize swelling and discomfort after procedure.  Apply heat: Apply heat to procedure site starting the day following the procedure. The purpose is to treat any soreness and discomfort from the procedure.  Food intake: Start with clear liquids (like water) and advance to regular food, as tolerated.   Physical activities: Keep activities to a minimum for the first 8 hours after the procedure.   Driving: If you have received any sedation, you are not allowed to drive for 24 hours after your procedure.  Blood thinner: Restart your blood thinner 6 hours after your procedure. (Only for those taking blood thinners)  Insulin: As soon as you can eat, you may resume your normal dosing schedule. (Only for those taking insulin)  Infection prevention: Keep procedure site clean and dry.  Post-procedure Pain Diary: Extremely important that this be done correctly and accurately. Recorded information will be used to determine the next step in treatment.  Pain evaluated is that of treated area only. Do not include pain from an untreated area.  Complete every hour, on the hour, for the initial 8 hours. Set an alarm to help you do this part accurately.  Do not go to sleep and have it completed later. It will not be accurate.  Follow-up appointment: Keep your follow-up appointment after the procedure. Usually 2 weeks for most procedures. (6 weeks in the case of radiofrequency.) Bring you pain diary.  Expect:  From numbing medicine (AKA: Local Anesthetics): Numbness or decrease in pain.  Onset: Full effect within 15 minutes of  injected.  Duration: It will depend on the type of local anesthetic used. On the average, 1 to 8 hours.   From steroids: Decrease in swelling or inflammation. Once inflammation is improved, relief of the pain will follow.  Onset of benefits: Depends on the amount of swelling present. The more swelling, the longer it will take for the benefits to be seen. In some cases, up to 10 days.  Duration: Steroids will stay in the system x 2 weeks. Duration of benefits will depend on multiple posibilities including persistent irritating factors.  From procedure: Some discomfort is to be expected once the numbing medicine wears off. This should be minimal if ice and heat are applied as instructed. Call if:  You experience numbness and weakness that gets worse with time, as opposed to wearing off.  New onset bowel or bladder incontinence. (Spinal procedures only)  Emergency Numbers:  Durning business hours (Monday - Thursday, 8:00 AM - 4:00 PM) (Friday, 9:00 AM - 12:00 Noon): [phone]  After hours: [phone] ____________________________________________________________________________________________   GENERAL RISKS AND COMPLICATIONS  What are the risk, side effects and possible complications? Generally speaking, most procedures are safe.  However, with any procedure there are risks, side effects, and the possibility of complications.  The risks and complications are dependent upon the sites that are lesioned, or the type of nerve block to be performed.  The closer the procedure is to the spine, the more serious the risks are.  Great care is taken when placing the radio frequency needles, block needles or lesioning probes, but sometimes complications can occur. 1. Infection: Any time there is an injection through the skin, there is  a risk of infection.  This is why sterile conditions are used for these blocks.  There are four possible types of infection. 1. Localized skin  infection. 2. Central Nervous System Infection-This can be in the form of Meningitis, which can be deadly. 3. Epidural Infections-This can be in the form of an epidural abscess, which can cause pressure inside of the spine, causing compression of the spinal cord with subsequent paralysis. This would require an emergency surgery to decompress, and there are no guarantees that the patient would recover from the paralysis. 4. Discitis-This is an infection of the intervertebral discs.  It occurs in about 1% of discography procedures.  It is difficult to treat and it may lead to surgery.        2. Pain: the needles have to go through skin and soft tissues, will cause soreness.       3. Damage to internal structures:  The nerves to be lesioned may be near blood vessels or    other nerves which can be potentially damaged.       4. Bleeding: Bleeding is more common if the patient is taking blood thinners such as  aspirin, Coumadin, Ticiid, Plavix, etc., or if he/she have some genetic predisposition  such as hemophilia. Bleeding into the spinal canal can cause compression of the spinal  cord with subsequent paralysis.  This would require an emergency surgery to  decompress and there are no guarantees that the patient would recover from the  paralysis.       5. Pneumothorax:  Puncturing of a lung is a possibility, every time a needle is introduced in  the area of the chest or upper back.  Pneumothorax refers to free air around the  collapsed lung(s), inside of the thoracic cavity (chest cavity).  Another two possible  complications related to a similar event would include: Hemothorax and Chylothorax.   These are variations of the Pneumothorax, where instead of air around the collapsed  lung(s), you may have blood or chyle, respectively.       6. Spinal headaches: They may occur with any procedures in the area of the spine.       7. Persistent CSF (Cerebro-Spinal Fluid) leakage: This is a rare problem, but may occur   with prolonged intrathecal or epidural catheters either due to the formation of a fistulous  track or a dural tear.       8. Nerve damage: By working so close to the spinal cord, there is always a possibility of  nerve damage, which could be as serious as a permanent spinal cord injury with  paralysis.       9. Death:  Although rare, severe deadly allergic reactions known as "Anaphylactic  reaction" can occur to any of the medications used.      10. Worsening of the symptoms:  We can always make thing worse.  What are the chances of something like this happening? Chances of any of this occuring are extremely low.  By statistics, you have more of a chance of getting killed in a motor vehicle accident: while driving to the hospital than any of the above occurring .  Nevertheless, you should be aware that they are possibilities.  In general, it is similar to taking a shower.  Everybody knows that you can slip, hit your head and get killed.  Does that mean that you should not shower again?  Nevertheless always keep in mind that statistics do not mean anything if you  happen to be on the wrong side of them.  Even if a procedure has a 1 (one) in a 1,000,000 (million) chance of going wrong, it you happen to be that one..Also, keep in mind that by statistics, you have more of a chance of having something go wrong when taking medications.  Who should not have this procedure? If you are on a blood thinning medication (e.g. Coumadin, Plavix, see list of "Blood Thinners"), or if you have an active infection going on, you should not have the procedure.  If you are taking any blood thinners, please inform your physician.  How should I prepare for this procedure?  Do not eat or drink anything at least six hours prior to the procedure.  Bring a driver with you .  It cannot be a taxi.  Come accompanied by an adult that can drive you back, and that is strong enough to help you if your legs get weak or numb from the  local anesthetic.  Take all of your medicines the morning of the procedure with just enough water to swallow them.  If you have diabetes, make sure that you are scheduled to have your procedure done first thing in the morning, whenever possible.  If you have diabetes, take only half of your insulin dose and notify our nurse that you have done so as soon as you arrive at the clinic.  If you are diabetic, but only take blood sugar pills (oral hypoglycemic), then do not take them on the morning of your procedure.  You may take them after you have had the procedure.  Do not take aspirin or any aspirin-containing medications, at least eleven (11) days prior to the procedure.  They may prolong bleeding.  Wear loose fitting clothing that may be easy to take off and that you would not mind if it got stained with Betadine or blood.  Do not wear any jewelry or perfume  Remove any nail coloring.  It will interfere with some of our monitoring equipment.  NOTE: Remember that this is not meant to be interpreted as a complete list of all possible complications.  Unforeseen problems may occur.  BLOOD THINNERS The following drugs contain aspirin or other products, which can cause increased bleeding during surgery and should not be taken for 2 weeks prior to and 1 week after surgery.  If you should need take something for relief of minor pain, you may take acetaminophen which is found in Tylenol,m Datril, Anacin-3 and Panadol. It is not blood thinner. The products listed below are.  Do not take any of the products listed below in addition to any listed on your instruction sheet.  A.P.C or A.P.C with Codeine Codeine Phosphate Capsules #3 Ibuprofen Ridaura  ABC compound Congesprin Imuran rimadil  Advil Cope Indocin Robaxisal  Alka-Seltzer Effervescent Pain Reliever and Antacid Coricidin or Coricidin-D  Indomethacin Rufen  Alka-Seltzer plus Cold Medicine Cosprin Ketoprofen S-A-C Tablets  Anacin Analgesic  Tablets or Capsules Coumadin Korlgesic Salflex  Anacin Extra Strength Analgesic tablets or capsules CP-2 Tablets Lanoril Salicylate  Anaprox Cuprimine Capsules Levenox Salocol  Anexsia-D Dalteparin Magan Salsalate  Anodynos Darvon compound Magnesium Salicylate Sine-off  Ansaid Dasin Capsules Magsal Sodium Salicylate  Anturane Depen Capsules Marnal Soma  APF Arthritis pain formula Dewitt's Pills Measurin Stanback  Argesic Dia-Gesic Meclofenamic Sulfinpyrazone  Arthritis Bayer Timed Release Aspirin Diclofenac Meclomen Sulindac  Arthritis pain formula Anacin Dicumarol Medipren Supac  Analgesic (Safety coated) Arthralgen Diffunasal Mefanamic Suprofen  Arthritis Strength Bufferin Dihydrocodeine  Mepro Compound Suprol  Arthropan liquid Dopirydamole Methcarbomol with Aspirin Synalgos  ASA tablets/Enseals Disalcid Micrainin Tagament  Ascriptin Doan's Midol Talwin  Ascriptin A/D Dolene Mobidin Tanderil  Ascriptin Extra Strength Dolobid Moblgesic Ticlid  Ascriptin with Codeine Doloprin or Doloprin with Codeine Momentum Tolectin  Asperbuf Duoprin Mono-gesic Trendar  Aspergum Duradyne Motrin or Motrin IB Triminicin  Aspirin plain, buffered or enteric coated Durasal Myochrisine Trigesic  Aspirin Suppositories Easprin Nalfon Trillsate  Aspirin with Codeine Ecotrin Regular or Extra Strength Naprosyn Uracel  Atromid-S Efficin Naproxen Ursinus  Auranofin Capsules Elmiron Neocylate Vanquish  Axotal Emagrin Norgesic Verin  Azathioprine Empirin or Empirin with Codeine Normiflo Vitamin E  Azolid Emprazil Nuprin Voltaren  Bayer Aspirin plain, buffered or children's or timed BC Tablets or powders Encaprin Orgaran Warfarin Sodium  Buff-a-Comp Enoxaparin Orudis Zorpin  Buff-a-Comp with Codeine Equegesic Os-Cal-Gesic   Buffaprin Excedrin plain, buffered or Extra Strength Oxalid   Bufferin Arthritis Strength Feldene Oxphenbutazone   Bufferin plain or Extra Strength Feldene Capsules Oxycodone with Aspirin    Bufferin with Codeine Fenoprofen Fenoprofen Pabalate or Pabalate-SF   Buffets II Flogesic Panagesic   Buffinol plain or Extra Strength Florinal or Florinal with Codeine Panwarfarin   Buf-Tabs Flurbiprofen Penicillamine   Butalbital Compound Four-way cold tablets Penicillin   Butazolidin Fragmin Pepto-Bismol   Carbenicillin Geminisyn Percodan   Carna Arthritis Reliever Geopen Persantine   Carprofen Gold's salt Persistin   Chloramphenicol Goody's Phenylbutazone   Chloromycetin Haltrain Piroxlcam   Clmetidine heparin Plaquenil   Cllnoril Hyco-pap Ponstel   Clofibrate Hydroxy chloroquine Propoxyphen         Before stopping any of these medications, be sure to consult the physician who ordered them.  Some, such as Coumadin (Warfarin) are ordered to prevent or treat serious conditions such as "deep thrombosis", "pumonary embolisms", and other heart problems.  The amount of time that you may need off of the medication may also vary with the medication and the reason for which you were taking it.  If you are taking any of these medications, please make sure you notify your pain physician before you undergo any procedures.

## 2017-12-21 NOTE — Progress Notes (Signed)
Patient's Name: Denise Barry  MRN: [id number]  Referring Provider: Einar Pheasant, MD  DOB: 1933-12-07  PCP: Einar Pheasant, MD  DOS: 12/21/2017  Note by: Gaspar Cola, MD  Service setting: Ambulatory outpatient  Specialty: Interventional Pain Management  Patient type: Established  Location: [facility] (AMB) Pain Management Facility  Visit type: Interventional Procedure   Primary Reason for Visit: Interventional Pain Management Treatment. CC: Back Pain (lower back)  Procedure:       Anesthesia, Analgesia, Anxiolysis:  Procedure #1: Type: Medial Branch Facet Block #3 Primary Purpose: Diagnostic Region: Lumbar Level: L2, L3, L4, L5, & S1 Medial Branch Level(s) Target Area: For Lumbar Facet blocks, the target is the groove formed by the junction of the transverse process and superior articular process. For the L5 dorsal ramus, the target is the notch between superior articular process and sacral ala. For the S1 dorsal ramus, the target is the superior and lateral edge of the posterior S1 Sacral foramen. Approach: Posterior, paramedial, percutaneous approach. Laterality: Bilateral Position: Prone  Procedure #2: Type: Sacroiliac Joint Block #3  Primary Purpose: Diagnostic Region: Posterior Lumbosacral Level: PSIS (Posterior Superior Iliac Spine) Sacroiliac Joint Target Area: For upper sacroiliac joint block(s), the target is the superior and posterior margin of the sacroiliac joint. Approach: Ipsilateral approach. Laterality: Bilateral Position: Prone  Type: Local Anesthesia with Moderate (Conscious) Sedation Local Anesthetic: Lidocaine 1% Route: Intravenous (IV) IV Access: Secured Sedation: Meaningful verbal contact was maintained at all times during the procedure  Indication(s): Analgesia and Anxiety   Indications: 1. Spondylosis without myelopathy or radiculopathy, lumbosacral region   2. Lumbar facet syndrome (Bilateral) (L>R)   3. Other specified dorsopathies, sacral  and sacrococcygeal region   4. Chronic sacroiliac joint pain (Bilateral) (L>R)    Pain Score: Pre-procedure: 2 /10 Post-procedure: 0-No pain/10  Pre-op Assessment:  Denise Barry is a 84 y.o. (year old), female patient, seen today for interventional treatment. She  has a past surgical history that includes Back surgery (09/16/13); sclerosis (2000); Dilation and curettage of uterus (1971); Tubal ligation (1972); and Joint replacement. Denise Barry has a current medication list which includes the following prescription(s): acetaminophen, amlodipine, aspirin, eslicarbazepine acetate, estradiol, integra, fexofenadine, multi-vitamins, and tizanidine, and the following Facility-Administered Medications: fentanyl, methylprednisolone acetate, and midazolam. Her primarily concern today is the Back Pain (lower back)  Initial Vital Signs:  Pulse Rate: 89 Temp: 98.2 F (36.8 C) Resp: 17 BP: (!) 131/91 SpO2: 100 %  BMI: Estimated body mass index is 25.79 kg/m as calculated from the following:   Height as of this encounter: 5\' 5"  (1.651 m).   Weight as of this encounter: 155 lb (70.3 kg).  Risk Assessment: Allergies: Reviewed. She is allergic to codeine; cyclobenzaprine; methocarbamol; carbamazepine; and gabapentin.  Allergy Precautions: None required Coagulopathies: Reviewed. None identified.  Blood-thinner therapy: None at this time Active Infection(s): Reviewed. None identified. Denise Barry is afebrile  Site Confirmation: Denise Barry was asked to confirm the procedure and laterality before marking the site Procedure checklist: Completed Consent: Before the procedure and under the influence of no sedative(s), amnesic(s), or anxiolytics, the patient was informed of the treatment options, risks and possible complications. To fulfill our ethical and legal obligations, as recommended by the American Medical Association's Code of Ethics, I have informed the patient of my clinical impression; the  nature and purpose of the treatment or procedure; the risks, benefits, and possible complications of the intervention; the alternatives, including doing nothing; the risk(s) and benefit(s) of the alternative treatment(s) or  procedure(s); and the risk(s) and benefit(s) of doing nothing. The patient was provided information about the general risks and possible complications associated with the procedure. These may include, but are not limited to: failure to achieve desired goals, infection, bleeding, organ or nerve damage, allergic reactions, paralysis, and death. In addition, the patient was informed of those risks and complications associated to Spine-related procedures, such as failure to decrease pain; infection (i.e.: Meningitis, epidural or intraspinal abscess); bleeding (i.e.: epidural hematoma, subarachnoid hemorrhage, or any other type of intraspinal or peri-dural bleeding); organ or nerve damage (i.e.: Any type of peripheral nerve, nerve root, or spinal cord injury) with subsequent damage to sensory, motor, and/or autonomic systems, resulting in permanent pain, numbness, and/or weakness of one or several areas of the body; allergic reactions; (i.e.: anaphylactic reaction); and/or death. Furthermore, the patient was informed of those risks and complications associated with the medications. These include, but are not limited to: allergic reactions (i.e.: anaphylactic or anaphylactoid reaction(s)); adrenal axis suppression; blood sugar elevation that in diabetics may result in ketoacidosis or comma; water retention that in patients with history of congestive heart failure may result in shortness of breath, pulmonary edema, and decompensation with resultant heart failure; weight gain; swelling or edema; medication-induced neural toxicity; particulate matter embolism and blood vessel occlusion with resultant organ, and/or nervous system infarction; and/or aseptic necrosis of one or more joints. Finally, the  patient was informed that Medicine is not an exact science; therefore, there is also the possibility of unforeseen or unpredictable risks and/or possible complications that may result in a catastrophic outcome. The patient indicated having understood very clearly. We have given the patient no guarantees and we have made no promises. Enough time was given to the patient to ask questions, all of which were answered to the patient's satisfaction. Ms. Shepardson has indicated that she wanted to continue with the procedure. Attestation: I, the ordering provider, attest that I have discussed with the patient the benefits, risks, side-effects, alternatives, likelihood of achieving goals, and potential problems during recovery for the procedure that I have provided informed consent. Date  Time: 12/21/2017 10:07 AM  Pre-Procedure Preparation:  Monitoring: As per clinic protocol. Respiration, ETCO2, SpO2, BP, heart rate and rhythm monitor placed and checked for adequate function Safety Precautions: Patient was assessed for positional comfort and pressure points before starting the procedure. Time-out: I initiated and conducted the "Time-out" before starting the procedure, as per protocol. The patient was asked to participate by confirming the accuracy of the "Time Out" information. Verification of the correct person, site, and procedure were performed and confirmed by me, the nursing staff, and the patient. "Time-out" conducted as per Joint Commission's Universal Protocol (UP.01.01.01). Time: 1059  Description of Procedure #1 Process:   Time-out: "Time-out" completed before starting procedure, as per protocol. Area Prepped: Entire Posterior Lumbosacral Region Prepping solution: ChloraPrep (2% chlorhexidine gluconate and 70% isopropyl alcohol) Safety Precautions: Aspiration looking for blood return was conducted prior to all injections. At no point did we inject any substances, as a needle was being advanced. No  attempts were made at seeking any paresthesias. Safe injection practices and needle disposal techniques used. Medications properly checked for expiration dates. SDV (single dose vial) medications used.  Description of the Procedure: Protocol guidelines were followed. The patient was placed in position over the fluoroscopy table. The target area was identified and the area prepped in the usual manner. Skin desensitized using vapocoolant spray. Skin & deeper tissues infiltrated with local anesthetic. Appropriate amount of  time allowed to pass for local anesthetics to take effect. The procedure needle was introduced through the skin, ipsilateral to the reported pain, and advanced to the target area. Employing the "Medial Branch Technique", the needles were advanced to the angle made by the superior and medial portion of the transverse process, and the lateral and inferior portion of the superior articulating process of the targeted vertebral bodies. This area is known as "Burton's Eye" or the "Eye of the [place] Dog". A procedure needle was introduced through the skin, and this time advanced to the angle made by the superior and medial border of the sacral ala, and the lateral border of the S1 vertebral body. This last needle was later repositioned at the superior and lateral border of the posterior S1 foramen. Negative aspiration confirmed. Solution injected in intermittent fashion, asking for systemic symptoms every 0.5cc of injectate. The needles were then removed and the area cleansed, making sure to leave some of the prepping solution back to take advantage of its long term bactericidal properties. Start Time: 1059 hrs. Materials:  Needle(s) Type: Regular needle Gauge: 22G Length: 3.5-in Medication(s): Please see orders for medications and dosing details.  Description of Procedure # 2 Process:   Area Prepped: Entire Posterior Lumbosacral Region Prepping solution: ChloraPrep (2% chlorhexidine gluconate  and 70% isopropyl alcohol) Safety Precautions: Aspiration looking for blood return was conducted prior to all injections. At no point did we inject any substances, as a needle was being advanced. No attempts were made at seeking any paresthesias. Safe injection practices and needle disposal techniques used. Medications properly checked for expiration dates. SDV (single dose vial) medications used. Description of the Procedure: Protocol guidelines were followed. The patient was placed in position over the fluoroscopy table. The target area was identified and the area prepped in the usual manner. Skin desensitized using vapocoolant spray. Skin & deeper tissues infiltrated with local anesthetic. Appropriate amount of time allowed to pass for local anesthetics to take effect. The procedure needle was advanced under fluoroscopic guidance into the sacroiliac joint until a firm endpoint was obtained. Proper needle placement secured. Negative aspiration confirmed. Solution injected in intermittent fashion, asking for systemic symptoms every 0.5cc of injectate. The needles were then removed and the area cleansed, making sure to leave some of the prepping solution back to take advantage of its long term bactericidal properties. Vitals:   12/21/17 1115 12/21/17 1123 12/21/17 1135 12/21/17 1145  BP: 136/73 (!) 144/69 (!) 145/72 (!) 145/72  Pulse: 84 78 73 75  Resp: 16 17 18 16   Temp:  (!) 97.4 F (36.3 C)  (!) 97.1 F (36.2 C)  SpO2: 99% 97% 98% 98%  Weight:      Height:        End Time: 1112 hrs. Materials:  Needle(s) Type: Regular needle Gauge: 22G Length: 3.5-in Medication(s): Please see orders for medications and dosing details.  Imaging Guidance (Spinal):  Type of Imaging Technique: Fluoroscopy Guidance (Spinal) Indication(s): Assistance in needle guidance and placement for procedures requiring needle placement in or near specific anatomical locations not easily accessible without such  assistance. Exposure Time: Please see nurses notes. Contrast: None used. Fluoroscopic Guidance: I was personally present during the use of fluoroscopy. "Tunnel Vision Technique" used to obtain the best possible view of the target area. Parallax error corrected before commencing the procedure. "Direction-depth-direction" technique used to introduce the needle under continuous pulsed fluoroscopy. Once target was reached, antero-posterior, oblique, and lateral fluoroscopic projection used confirm needle placement in all  planes. Images permanently stored in EMR. Interpretation: No contrast injected. I personally interpreted the imaging intraoperatively. Adequate needle placement confirmed in multiple planes. Permanent images saved into the patient's record.  Antibiotic Prophylaxis:   Anti-infectives (From admission, onward)   None     Indication(s): None identified  Post-operative Assessment:  Post-procedure Vital Signs:  Pulse Rate: 75 Temp: (!) 97.1 F (36.2 C) Resp: 16 BP: (!) 145/72 SpO2: 98 %  EBL: None  Complications: No immediate post-treatment complications observed by team, or reported by patient.  Note: The patient tolerated the entire procedure well. A repeat set of vitals were taken after the procedure and the patient was kept under observation following institutional policy, for this type of procedure. Post-procedural neurological assessment was performed, showing return to baseline, prior to discharge. The patient was provided with post-procedure discharge instructions, including a section on how to identify potential problems. Should any problems arise concerning this procedure, the patient was given instructions to immediately contact us, at any time, without hesitation. In any case, we plan to contact the patient by telephone for a follow-up status report regarding this interventional procedure.  Comments:  No additional relevant information.  Plan of Care    Imaging  Orders     DG C-Arm 1-60 Min-No Report  Procedure Orders     LUMBAR FACET(MEDIAL BRANCH NERVE BLOCK) MBNB     SACROILIAC JOINT INJECTION  Medications ordered for procedure: Meds ordered this encounter  Medications  . midazolam (VERSED) 5 MG/5ML injection 1-2 mg    Make sure Flumazenil is available in the pyxis when using this medication. If oversedation occurs, administer 0.2 mg IV over 15 sec. If after 45 sec no response, administer 0.2 mg again over 1 min; may repeat at 1 min intervals; not to exceed 4 doses (1 mg)  . fentaNYL (SUBLIMAZE) injection 25-50 mcg    Make sure Narcan is available in the pyxis when using this medication. In the event of respiratory depression (RR< 8/min): Titrate NARCAN (naloxone) in increments of 0.1 to 0.2 mg IV at 2-3 minute intervals, until desired degree of reversal.  . lactated ringers infusion 1,000 mL  . lidocaine (XYLOCAINE) 2 % (with pres) injection 400 mg  . ropivacaine (PF) 2 mg/mL (0.2%) (NAROPIN) injection 18 mL  . triamcinolone acetonide (KENALOG-40) injection 80 mg  . ropivacaine (PF) 2 mg/mL (0.2%) (NAROPIN) injection 9 mL  . methylPREDNISolone acetate (DEPO-MEDROL) injection 80 mg   Medications administered: We administered midazolam, fentaNYL, lactated ringers, lidocaine, ropivacaine (PF) 2 mg/mL (0.2%), triamcinolone acetonide, and ropivacaine (PF) 2 mg/mL (0.2%).  See the medical record for exact dosing, route, and time of administration.  New Prescriptions   No medications on file   Disposition: Discharge home  Discharge Date & Time: 12/21/2017; 1146 hrs.   Physician-requested Follow-up: Return for post-procedure eval (2 wks), w/ Dr. Dossie Arbour.  Future Appointments  Date Time Provider [place]  01/03/2018 11:30 AM [facility]  01/09/2018 10:30 AM Milinda Pointer, MD [facility] None  01/15/2018  9:40 AM [facility] DEXA [facility]  02/23/2018  9:15 AM [facility]  02/26/2018 11:00 AM Einar Pheasant, MD [facility]  11/06/2018  1:00 PM O'Brien-Blaney, Bryson Corona, LPN [facility]   Primary Care Physician: Einar Pheasant, MD Location: [facility] Note by: Gaspar Cola, MD Date: 12/21/2017; Time: 11:14 AM  Disclaimer:  Medicine is not an Chief Strategy Officer. The only guarantee in medicine is that nothing is guaranteed. It is  important to note that the decision to proceed with this intervention was based on the information collected from the patient. The Data and conclusions were drawn from the patient's questionnaire, the interview, and the physical examination. Because the information was provided in large part by the patient, it cannot be guaranteed that it has not been purposely or unconsciously manipulated. Every effort has been made to obtain as much relevant data as possible for this evaluation. It is important to note that the conclusions that lead to this procedure are derived in large part from the available data. Always take into account that the treatment will also be dependent on availability of resources and existing treatment guidelines, considered by other Pain Management Practitioners as being common knowledge and practice, at the time of the intervention. For Medico-Legal purposes, it is also important to point out that variation in procedural techniques and pharmacological choices are the acceptable norm. The indications, contraindications, technique, and results of the above procedure should only be interpreted and judged by a Board-Certified Interventional Pain Specialist with extensive familiarity and expertise in the same exact procedure and technique.

## 2017-12-22 ENCOUNTER — Telehealth: Payer: Self-pay

## 2017-12-22 NOTE — Telephone Encounter (Signed)
Post procedure phone call.  Line busy x 3 attempts.

## 2018-01-01 ENCOUNTER — Ambulatory Visit: Payer: Medicare Other

## 2018-01-03 ENCOUNTER — Ambulatory Visit: Admission: RE | Admit: 2018-01-03 | Payer: Medicare Other | Source: Ambulatory Visit

## 2018-01-03 ENCOUNTER — Ambulatory Visit
Admission: RE | Admit: 2018-01-03 | Discharge: 2018-01-03 | Disposition: A | Discharge status: Home / Self Care | Payer: Medicare Other | Source: Ambulatory Visit | Attending: Gastroenterology | Admitting: Gastroenterology

## 2018-01-03 DIAGNOSIS — R1013 Epigastric pain: Secondary | ICD-10-CM | POA: Diagnosis not present

## 2018-01-03 DIAGNOSIS — I700 Atherosclerosis of aorta: Secondary | ICD-10-CM | POA: Insufficient documentation

## 2018-01-03 DIAGNOSIS — Z981 Arthrodesis status: Secondary | ICD-10-CM | POA: Diagnosis not present

## 2018-01-03 DIAGNOSIS — K449 Diaphragmatic hernia without obstruction or gangrene: Secondary | ICD-10-CM | POA: Diagnosis not present

## 2018-01-03 DIAGNOSIS — M47816 Spondylosis without myelopathy or radiculopathy, lumbar region: Secondary | ICD-10-CM | POA: Insufficient documentation

## 2018-01-03 MED ORDER — IOPAMIDOL (ISOVUE-300) INJECTION 61%
100.0000 mL | Freq: Once | INTRAVENOUS | Status: AC | PRN
  Administered 2018-01-03: 100 mL via INTRAVENOUS

## 2018-01-04 ENCOUNTER — Encounter: Payer: Self-pay | Admitting: Gastroenterology

## 2018-01-08 ENCOUNTER — Telehealth: Payer: Self-pay | Admitting: Nurse Practitioner

## 2018-01-08 ENCOUNTER — Telehealth: Payer: Self-pay

## 2018-01-08 NOTE — Telephone Encounter (Signed)
Contacted pt to schedule EGD & Colonoscopy per Dr. Georgeann Oppenheim request.   CT scan shows diverticulitis healed.   Patient states she will callback to schedule the procedures.   She's currently undergoing treatment for her back. Her goal is to "get my back under controlled before having any other invasive treatments. I have back spasms that are unpredictable and tomorrow I'm having a laser treatment that's supposed to help with all that."  Note has been sent to Dr. Vicente Males advising the patient's wishes.

## 2018-01-08 NOTE — Telephone Encounter (Signed)
Was charged for a blood test because she could not do a urine test. She has forms to be completed by C. Edison Pace stating it was a medical need so it will be paid for by insurance.

## 2018-01-09 ENCOUNTER — Ambulatory Visit
Admission: RE | Admit: 2018-01-09 | Discharge: 2018-01-09 | Disposition: A | Discharge status: Home / Self Care | Payer: Medicare Other | Source: Ambulatory Visit | Attending: Pain Medicine | Admitting: Pain Medicine

## 2018-01-09 ENCOUNTER — Ambulatory Visit (HOSPITAL_BASED_OUTPATIENT_CLINIC_OR_DEPARTMENT_OTHER): Payer: Medicare Other | Admitting: Pain Medicine

## 2018-01-09 ENCOUNTER — Other Ambulatory Visit: Payer: Self-pay

## 2018-01-09 ENCOUNTER — Encounter: Payer: Self-pay | Admitting: Pain Medicine

## 2018-01-09 VITALS — BP 152/72 | HR 98 | Temp 97.0°F | Resp 22 | Ht 66.0 in | Wt 155.0 lb

## 2018-01-09 DIAGNOSIS — M5388 Other specified dorsopathies, sacral and sacrococcygeal region: Secondary | ICD-10-CM | POA: Diagnosis not present

## 2018-01-09 DIAGNOSIS — M545 Low back pain, unspecified: Secondary | ICD-10-CM

## 2018-01-09 DIAGNOSIS — M533 Sacrococcygeal disorders, not elsewhere classified: Secondary | ICD-10-CM | POA: Diagnosis not present

## 2018-01-09 DIAGNOSIS — M47817 Spondylosis without myelopathy or radiculopathy, lumbosacral region: Secondary | ICD-10-CM | POA: Diagnosis not present

## 2018-01-09 DIAGNOSIS — G8929 Other chronic pain: Secondary | ICD-10-CM | POA: Diagnosis not present

## 2018-01-09 DIAGNOSIS — M47816 Spondylosis without myelopathy or radiculopathy, lumbar region: Secondary | ICD-10-CM | POA: Insufficient documentation

## 2018-01-09 DIAGNOSIS — G894 Chronic pain syndrome: Secondary | ICD-10-CM

## 2018-01-09 MED ORDER — FENTANYL CITRATE (PF) 100 MCG/2ML IJ SOLN
25.0000 ug | INTRAMUSCULAR | Status: DC | PRN
  Administered 2018-01-09: 50 ug via INTRAVENOUS
  Filled 2018-01-09: qty 2

## 2018-01-09 MED ORDER — ROPIVACAINE HCL 2 MG/ML IJ SOLN
18.0000 mL | Freq: Once | INTRAMUSCULAR | Status: AC
  Administered 2018-01-09: 10 mL via PERINEURAL
  Filled 2018-01-09: qty 20

## 2018-01-09 MED ORDER — TRIAMCINOLONE ACETONIDE 40 MG/ML IJ SUSP
80.0000 mg | Freq: Once | INTRAMUSCULAR | Status: AC
  Administered 2018-01-09: 40 mg
  Filled 2018-01-09: qty 2

## 2018-01-09 MED ORDER — LACTATED RINGERS IV SOLN
1000.0000 mL | Freq: Once | INTRAVENOUS | Status: AC
  Administered 2018-01-09: 1000 mL via INTRAVENOUS

## 2018-01-09 MED ORDER — TRAMADOL HCL 50 MG PO TABS: 50.0000 mg | ORAL_TABLET | Freq: Four times a day (QID) | ORAL | 0 refills | Status: DC | PRN

## 2018-01-09 MED ORDER — LIDOCAINE HCL 2 % IJ SOLN
10.0000 mL | Freq: Once | INTRAMUSCULAR | Status: AC
  Administered 2018-01-09: 400 mg
  Filled 2018-01-09: qty 100

## 2018-01-09 MED ORDER — MIDAZOLAM HCL 5 MG/5ML IJ SOLN
1.0000 mg | INTRAMUSCULAR | Status: DC | PRN
  Administered 2018-01-09: 3 mg via INTRAVENOUS
  Filled 2018-01-09: qty 5

## 2018-01-09 NOTE — Patient Instructions (Addendum)
____________________________________________________________________________________________  Post-Procedure Discharge Instructions  Instructions:  Apply ice: Fill a plastic sandwich bag with crushed ice. Cover it with a small towel and apply to injection site. Apply for 15 minutes then remove x 15 minutes. Repeat sequence on day of procedure, until you go to bed. The purpose is to minimize swelling and discomfort after procedure.  Apply heat: Apply heat to procedure site starting the day following the procedure. The purpose is to treat any soreness and discomfort from the procedure.  Food intake: Start with clear liquids (like water) and advance to regular food, as tolerated.   Physical activities: Keep activities to a minimum for the first 8 hours after the procedure.   Driving: If you have received any sedation, you are not allowed to drive for 24 hours after your procedure.  Blood thinner: Restart your blood thinner 6 hours after your procedure. (Only for those taking blood thinners)  Insulin: As soon as you can eat, you may resume your normal dosing schedule. (Only for those taking insulin)  Infection prevention: Keep procedure site clean and dry.  Post-procedure Pain Diary: Extremely important that this be done correctly and accurately. Recorded information will be used to determine the next step in treatment.  Pain evaluated is that of treated area only. Do not include pain from an untreated area.  Complete every hour, on the hour, for the initial 8 hours. Set an alarm to help you do this part accurately.  Do not go to sleep and have it completed later. It will not be accurate.  Follow-up appointment: Keep your follow-up appointment after the procedure. Usually 2 weeks for most procedures. (6 weeks in the case of radiofrequency.) Bring you pain diary.   Expect:  From numbing medicine (AKA: Local Anesthetics): Numbness or decrease in pain.  Onset: Full effect within 15  minutes of injected.  Duration: It will depend on the type of local anesthetic used. On the average, 1 to 8 hours.   From steroids: Decrease in swelling or inflammation. Once inflammation is improved, relief of the pain will follow.  Onset of benefits: Depends on the amount of swelling present. The more swelling, the longer it will take for the benefits to be seen. In some cases, up to 10 days.  Duration: Steroids will stay in the system x 2 weeks. Duration of benefits will depend on multiple posibilities including persistent irritating factors.  From procedure: Some discomfort is to be expected once the numbing medicine wears off. This should be minimal if ice and heat are applied as instructed.  Call if:  You experience numbness and weakness that gets worse with time, as opposed to wearing off.  New onset bowel or bladder incontinence. (This applies to Spinal procedures only)  Emergency Numbers:  Durning business hours (Monday - Thursday, 8:00 AM - 4:00 PM) (Friday, 9:00 AM - 12:00 Noon): [phone]  After hours: [phone] ____________________________________________________________________________________________   ____________________________________________________________________________________________  Pain Scale  Introduction: The pain score used by this practice is the Verbal Numerical Rating Scale (VNRS-11). This is an 11-point scale. It is for adults and children 10 years or older. There are significant differences in how the pain score is reported, used, and applied. Forget everything you learned in the past and learn this scoring system.  General Information: The scale should reflect your current level of pain. Unless you are specifically asked for the level of your worst pain, or your average pain. If you are asked for one of these two, then  it should be understood that it is over the past 24 hours.  Basic Activities of Daily Living (ADL): Personal hygiene,  dressing, eating, transferring, and using restroom.  Instructions: Most patients tend to report their level of pain as a combination of two factors, their physical pain and their psychosocial pain. This last one is also known as "suffering" and it is reflection of how physical pain affects you socially and psychologically. From now on, report them separately. From this point on, when asked to report your pain level, report only your physical pain. Use the following table for reference.  Pain Clinic Pain Levels (0-5/10)  Pain Level Score  Description  No Pain 0   Mild pain 1 Nagging, annoying, but does not interfere with basic activities of daily living (ADL). Patients are able to eat, bathe, get dressed, toileting (being able to get on and off the toilet and perform personal hygiene functions), transfer (move in and out of bed or a chair without assistance), and maintain continence (able to control bladder and bowel functions). Blood pressure and heart rate are unaffected. A normal heart rate for a healthy adult ranges from 60 to 100 bpm (beats per minute).   Mild to moderate pain 2 Noticeable and distracting. Impossible to hide from other people. More frequent flare-ups. Still possible to adapt and function close to normal. It can be very annoying and may have occasional stronger flare-ups. With discipline, patients may get used to it and adapt.   Moderate pain 3 Interferes significantly with activities of daily living (ADL). It becomes difficult to feed, bathe, get dressed, get on and off the toilet or to perform personal hygiene functions. Difficult to get in and out of bed or a chair without assistance. Very distracting. With effort, it can be ignored when deeply involved in activities.   Moderately severe pain 4 Impossible to ignore for more than a few minutes. With effort, patients may still be able to manage work or participate in some social activities. Very difficult to concentrate. Signs of  autonomic nervous system discharge are evident: dilated pupils (mydriasis); mild sweating (diaphoresis); sleep interference. Heart rate becomes elevated (>115 bpm). Diastolic blood pressure (lower number) rises above 100 mmHg. Patients find relief in laying down and not moving.   Severe pain 5 Intense and extremely unpleasant. Associated with frowning face and frequent crying. Pain overwhelms the senses.  Ability to do any activity or maintain social relationships becomes significantly limited. Conversation becomes difficult. Pacing back and forth is common, as getting into a comfortable position is nearly impossible. Pain wakes you up from deep sleep. Physical signs will be obvious: pupillary dilation; increased sweating; goosebumps; brisk reflexes; cold, clammy hands and feet; nausea, vomiting or dry heaves; loss of appetite; significant sleep disturbance with inability to fall asleep or to remain asleep. When persistent, significant weight loss is observed due to the complete loss of appetite and sleep deprivation.  Blood pressure and heart rate becomes significantly elevated. Caution: If elevated blood pressure triggers a pounding headache associated with blurred vision, then the patient should immediately seek attention at an urgent or emergency care unit, as these may be signs of an impending stroke.    Emergency Department Pain Levels (6-10/10)  Emergency Room Pain 6 Severely limiting. Requires emergency care and should not be seen or managed at an outpatient pain management facility. Communication becomes difficult and requires great effort. Assistance to reach the emergency department may be required. Facial flushing and profuse sweating along with potentially  dangerous increases in heart rate and blood pressure will be evident.   Distressing pain 7 Self-care is very difficult. Assistance is required to transport, or use restroom. Assistance to reach the emergency department will be required. Tasks  requiring coordination, such as bathing and getting dressed become very difficult.   Disabling pain 8 Self-care is no longer possible. At this level, pain is disabling. The individual is unable to do even the most "basic" activities such as walking, eating, bathing, dressing, transferring to a bed, or toileting. Fine motor skills are lost. It is difficult to think clearly.   Incapacitating pain 9 Pain becomes incapacitating. Thought processing is no longer possible. Difficult to remember your own name. Control of movement and coordination are lost.   The worst pain imaginable 10 At this level, most patients pass out from pain. When this level is reached, collapse of the autonomic nervous system occurs, leading to a sudden drop in blood pressure and heart rate. This in turn results in a temporary and dramatic drop in blood flow to the brain, leading to a loss of consciousness. Fainting is one of the body's self defense mechanisms. Passing out puts the brain in a calmed state and causes it to shut down for a while, in order to begin the healing process.    Summary: 1. Refer to this scale when providing [place] with your pain level. 2. Be accurate and careful when reporting your pain level. This will help with your care. 3. Over-reporting your pain level will lead to loss of credibility. 4. Even a level of 1/10 means that there is pain and will be treated at our facility. 5. High, inaccurate reporting will be documented as "Symptom Exaggeration", leading to loss of credibility and suspicions of possible secondary gains such as obtaining more narcotics, or wanting to appear disabled, for fraudulent reasons. 6. Only pain levels of 5 or below will be seen at our facility. 7. Pain levels of 6 and above will be sent to the Emergency Department and the appointment  cancelled. ____________________________________________________________________________________________   ____________________________________________________________________________________________  Preparing for Procedure with Sedation  Instructions: . Oral Intake: Do not eat or drink anything for at least 8 hours prior to your procedure. . Transportation: Public transportation is not allowed. Bring an adult driver. The driver must be physically present in our waiting room before any procedure can be started. Marland Kitchen Physical Assistance: Bring an adult physically capable of assisting you, in the event you need help. This adult should keep you company at home for at least 6 hours after the procedure. . Blood Pressure Medicine: Take your blood pressure medicine with a sip of water the morning of the procedure. . Blood thinners:  . Diabetics on insulin: Notify the staff so that you can be scheduled 1st case in the morning. If your diabetes requires high dose insulin, take only  of your normal insulin dose the morning of the procedure and notify the staff that you have done so. . Preventing infections: Shower with an antibacterial soap the morning of your procedure. . Build-up your immune system: Take 1000 mg of Vitamin C with every meal (3 times a day) the day prior to your procedure. Marland Kitchen Antibiotics: Inform the staff if you have a condition or reason that requires you to take antibiotics before dental procedures. . Pregnancy: If you are pregnant, call and cancel the procedure. . Sickness: If you have a cold, fever, or any active infections, call and cancel the procedure. . Arrival: You must  be in the facility at least 30 minutes prior to your scheduled procedure. . Children: Do not bring children with you. . Dress appropriately: Bring dark clothing that you would not mind if they get stained. . Valuables: Do not bring any jewelry or valuables.  Procedure appointments are reserved for interventional  treatments only. Marland Kitchen No Prescription Refills. . No medication changes will be discussed during procedure appointments. . No disability issues will be discussed.  Remember:  Regular Business hours are:  Monday to Thursday 8:00 AM to 4:00 PM  Provider's Schedule: Milinda Pointer, MD:  Procedure days: Tuesday and Thursday 7:30 AM to 4:00 PM  Gillis Santa, MD:  Procedure days: Monday and Wednesday 7:30 AM to 4:00 PM ____________________________________________________________________________________________Radiofrequency Lesioning Radiofrequency lesioning is a procedure that is performed to relieve pain. The procedure is often used for back, neck, or arm pain. Radiofrequency lesioning involves the use of a machine that creates radio waves to make heat. During the procedure, the heat is applied to the nerve that carries the pain signal. The heat damages the nerve and interferes with the pain signal. Pain relief usually starts about 2 weeks after the procedure and lasts for 6 months to 1 year. Tell a health care provider about:  Any allergies you have.  All medicines you are taking, including vitamins, herbs, eye drops, creams, and over-the-counter medicines.  Any problems you or family members have had with anesthetic medicines.  Any blood disorders you have.  Any surgeries you have had.  Any medical conditions you have.  Whether you are pregnant or may be pregnant. What are the risks? Generally, this is a safe procedure. However, problems may occur, including:  Pain or soreness at the injection site.  Infection at the injection site.  Damage to nerves or blood vessels.  What happens before the procedure?  Ask your health care provider about: ? Changing or stopping your regular medicines. This is especially important if you are taking diabetes medicines or blood thinners. ? Taking medicines such as aspirin and ibuprofen. These medicines can thin your blood. Do not take these  medicines before your procedure if your health care provider instructs you not to.  Follow instructions from your health care provider about eating or drinking restrictions.  Plan to have someone take you home after the procedure.  If you go home right after the procedure, plan to have someone with you for 24 hours. What happens during the procedure?  You will be given one or more of the following: ? A medicine to help you relax (sedative). ? A medicine to numb the area (local anesthetic).  You will be awake during the procedure. You will need to be able to talk with the health care provider during the procedure.  With the help of a type of X-ray (fluoroscopy), the health care provider will insert a radiofrequency needle into the area to be treated.  Next, a wire that carries the radio waves (electrode) will be put through the radiofrequency needle. An electrical pulse will be sent through the electrode to verify the correct nerve. You will feel a tingling sensation, and you may have muscle twitching.  Then, the tissue that is around the needle tip will be heated by an electric current that is passed using the radiofrequency machine. This will numb the nerves.  A bandage (dressing) will be put on the insertion area after the procedure is done. The procedure may vary among health care providers and hospitals. What happens after the procedure?  Your blood pressure, heart rate, breathing rate, and blood oxygen level will be monitored often until the medicines you were given have worn off.  Return to your normal activities as directed by your health care provider. This information is not intended to replace advice given to you by your health care provider. Make sure you discuss any questions you have with your health care provider. Document Released: 06/15/2011 Document Revised: 03/24/2016 Document Reviewed: 11/24/2014 Elsevier Interactive Patient Education  2018 [facility] Facet  Blocks Patient Information  Description: The facets are joints in the spine between the vertebrae.  Like any joints in the body, facets can become irritated and painful.  Arthritis can also effect the facets.  By injecting steroids and local anesthetic in and around these joints, we can temporarily block the nerve supply to them.  Steroids act directly on irritated nerves and tissues to reduce selling and inflammation which often leads to decreased pain.  Facet blocks may be done anywhere along the spine from the neck to the low back depending upon the location of your pain.   After numbing the skin with local anesthetic (like Novocaine), a small needle is passed onto the facet joints under x-ray guidance.  You may experience a sensation of pressure while this is being done.  The entire block usually lasts about 15-25 minutes.   Conditions which may be treated by facet blocks:   Low back/buttock pain  Neck/shoulder pain  Certain types of headaches  Preparation for the injection:  1. Do not eat any solid food or dairy products within 8 hours of your appointment. 2. You may drink clear liquid up to 3 hours before appointment.  Clear liquids include water, black coffee, juice or soda.  No milk or cream please. 3. You may take your regular medication, including pain medications, with a sip of water before your appointment.  Diabetics should hold regular insulin (if taken separately) and take 1/2 normal NPH dose the morning of the procedure.  Carry some sugar containing items with you to your appointment. 4. A driver must accompany you and be prepared to drive you home after your procedure. 5. Bring all your current medications with you. 6. An IV may be inserted and sedation may be given at the discretion of the physician. 7. A blood pressure cuff, EKG and other monitors will often be applied during the procedure.  Some patients may need to have extra oxygen administered for a short period. 8. You  will be asked to provide medical information, including your allergies and medications, prior to the procedure.  We must know immediately if you are taking blood thinners (like Coumadin/Warfarin) or if you are allergic to IV iodine contrast (dye).  We must know if you could possible be pregnant.  Possible side-effects:   Bleeding from needle site  Infection (rare, may require surgery)  Nerve injury (rare)  Numbness & tingling (temporary)  Difficulty urinating (rare, temporary)  Spinal headache (a headache worse with upright posture)  Light-headedness (temporary)  Pain at injection site (serveral days)  Decreased blood pressure (rare, temporary)  Weakness in arm/leg (temporary)  Pressure sensation in back/neck (temporary)   Call if you experience:   Fever/chills associated with headache or increased back/neck pain  Headache worsened by an upright position  New onset, weakness or numbness of an extremity below the injection site  Hives or difficulty breathing (go to the emergency room)  Inflammation or drainage at the injection site(s)  Severe back/neck pain greater than  usual  New symptoms which are concerning to you  Please note:  Although the local anesthetic injected can often make your back or neck feel good for several hours after the injection, the pain will likely return. It takes 3-7 days for steroids to work.  You may not notice any pain relief for at least one week.  If effective, we will often do a series of 2-3 injections spaced 3-6 weeks apart to maximally decrease your pain.  After the initial series, you may be a candidate for a more permanent nerve block of the facets.  If you have any questions, please call #[phone]) [facility]

## 2018-01-09 NOTE — Progress Notes (Signed)
Safety precautions to be maintained throughout the outpatient stay will include: orient to surroundings, keep bed in low position, maintain call bell within reach at all times, provide assistance with transfer out of bed and ambulation.  

## 2018-01-09 NOTE — Progress Notes (Signed)
Patient's Name: Denise Barry  MRN: [id number]  Referring Provider: Einar Pheasant, MD  DOB: 1934/03/17  PCP: Einar Pheasant, MD  DOS: 01/09/2018  Note by: Gaspar Cola, MD  Service setting: Ambulatory outpatient  Specialty: Interventional Pain Management  Patient type: Established  Location: [facility] (AMB) Pain Management Facility  Visit type: Interventional Procedure   Primary Reason for Visit: Interventional Pain Management Treatment. CC: Back Pain (low and worse on the right today)  Procedure:       Anesthesia, Analgesia, Anxiolysis:  Type: Thermal Lumbar Facet, Medial Branch & Sacroiliac joint Radiofrequency Ablation Region: Lumbosacral Level: L2, L3, L4, L5, S1, S2, & S3 Medial Branch Level(s). These levels will denervate the L3-4, L4-5, and the L5-S1 lumbar facet joints, as well as the posterior Sacroiliac Joint innervation. Primary Purpose: Therapeutic Region: Posterolateral Lumbosacral Spine Laterality: Left  Type: Moderate (Conscious) Sedation combined with Local Anesthesia Indication(s): Analgesia and Anxiety Route: Intravenous (IV) IV Access: Secured Sedation: Meaningful verbal contact was maintained at all times during the procedure  Local Anesthetic: Lidocaine 1-2%   Indications: 1. Spondylosis without myelopathy or radiculopathy, lumbosacral region   2. Other specified dorsopathies, sacral and sacrococcygeal region   3. Lumbar facet syndrome (Bilateral) (L>R)   4. Chronic sacroiliac joint pain (Bilateral) (L>R)   5. Chronic low back pain (Primary Area of Pain)  (Bilateral) (L>R)    Denise Barry has been dealing with the above chronic pain for longer than three months and has either failed to respond, was unable to tolerate, or simply did not get enough benefit from other more conservative therapies including, but not limited to: 1. Over-the-counter medications 2. Anti-inflammatory medications 3. Muscle relaxants 4. Membrane stabilizers 5. Opioids 6.  Physical therapy 7. Modalities (Heat, ice, etc.) 8. Invasive techniques such as nerve blocks. Ms. Cathy has attained more than 50% relief of the pain from a series of diagnostic injections conducted in separate occasions.  Pain Score: Pre-procedure: 8 /10 Post-procedure: 0-No pain/10  Pre-op Assessment:  Denise Barry is a 84 y.o. (year old), female patient, seen today for interventional treatment. She  has a past surgical history that includes Back surgery (09/16/13); sclerosis (2000); Dilation and curettage of uterus (1971); Tubal ligation (1972); and Joint replacement. [name] has a current medication list which includes the following prescription(s): acetaminophen, amlodipine, aspirin, eslicarbazepine acetate, estradiol, integra, fexofenadine, multi-vitamins, tizanidine, and tramadol, and the following Facility-Administered Medications: fentanyl and midazolam. Her primarily concern today is the Back Pain (low and worse on the right today)  Initial Vital Signs:  Pulse Rate: 98 Temp: 98.3 F (36.8 C) Resp: 20 BP: (!) 163/67 SpO2: 100 %  BMI: Estimated body mass index is 25.02 kg/m as calculated from the following:   Height as of this encounter: 5\' 6"  (1.676 m).   Weight as of this encounter: 155 lb (70.3 kg).  Risk Assessment: Allergies: Reviewed. She is allergic to codeine; cyclobenzaprine; methocarbamol; carbamazepine; and gabapentin.  Allergy Precautions: None required Coagulopathies: Reviewed. None identified.  Blood-thinner therapy: None at this time Active Infection(s): Reviewed. None identified. Denise Barry is afebrile  Site Confirmation: [name] was asked to confirm the procedure and laterality before marking the site Procedure checklist: Completed Consent: Before the procedure and under the influence of no sedative(s), amnesic(s), or anxiolytics, the patient was informed of the treatment options, risks and possible complications. To fulfill our ethical and  legal obligations, as recommended by the American Medical Association's Code of Ethics, I have informed the patient of my clinical impression;  the nature and purpose of the treatment or procedure; the risks, benefits, and possible complications of the intervention; the alternatives, including doing nothing; the risk(s) and benefit(s) of the alternative treatment(s) or procedure(s); and the risk(s) and benefit(s) of doing nothing. The patient was provided information about the general risks and possible complications associated with the procedure. These may include, but are not limited to: failure to achieve desired goals, infection, bleeding, organ or nerve damage, allergic reactions, paralysis, and death. In addition, the patient was informed of those risks and complications associated to Spine-related procedures, such as failure to decrease pain; infection (i.e.: Meningitis, epidural or intraspinal abscess); bleeding (i.e.: epidural hematoma, subarachnoid hemorrhage, or any other type of intraspinal or peri-dural bleeding); organ or nerve damage (i.e.: Any type of peripheral nerve, nerve root, or spinal cord injury) with subsequent damage to sensory, motor, and/or autonomic systems, resulting in permanent pain, numbness, and/or weakness of one or several areas of the body; allergic reactions; (i.e.: anaphylactic reaction); and/or death. Furthermore, the patient was informed of those risks and complications associated with the medications. These include, but are not limited to: allergic reactions (i.e.: anaphylactic or anaphylactoid reaction(s)); adrenal axis suppression; blood sugar elevation that in diabetics may result in ketoacidosis or comma; water retention that in patients with history of congestive heart failure may result in shortness of breath, pulmonary edema, and decompensation with resultant heart failure; weight gain; swelling or edema; medication-induced neural toxicity; particulate matter  embolism and blood vessel occlusion with resultant organ, and/or nervous system infarction; and/or aseptic necrosis of one or more joints. Finally, the patient was informed that Medicine is not an exact science; therefore, there is also the possibility of unforeseen or unpredictable risks and/or possible complications that may result in a catastrophic outcome. The patient indicated having understood very clearly. We have given the patient no guarantees and we have made no promises. Enough time was given to the patient to ask questions, all of which were answered to the patient's satisfaction. Ms. Windle has indicated that she wanted to continue with the procedure. Attestation: I, the ordering provider, attest that I have discussed with the patient the benefits, risks, side-effects, alternatives, likelihood of achieving goals, and potential problems during recovery for the procedure that I have provided informed consent. Date  Time: 01/09/2018 10:55 AM  Pre-Procedure Preparation:  Monitoring: As per clinic protocol. Respiration, ETCO2, SpO2, BP, heart rate and rhythm monitor placed and checked for adequate function Safety Precautions: Patient was assessed for positional comfort and pressure points before starting the procedure. Time-out: I initiated and conducted the "Time-out" before starting the procedure, as per protocol. The patient was asked to participate by confirming the accuracy of the "Time Out" information. Verification of the correct person, site, and procedure were performed and confirmed by me, the nursing staff, and the patient. "Time-out" conducted as per Joint Commission's Universal Protocol (UP.01.01.01). Time: 1133  Description of Procedure:       Position: Prone Laterality: Left Level: L2, L3, L4, L5, S1, S2, & S3 Medial Branch Level(s), at the L3-4, L4-5, and the L5-S1 lumbar facet joints, as well as the posterior Sacroiliac Joint. Area Prepped: Lumbosacral Prepping solution:  ChloraPrep (2% chlorhexidine gluconate and 70% isopropyl alcohol) Safety Precautions: Aspiration looking for blood return was conducted prior to all injections. At no point did we inject any substances, as a needle was being advanced. Before injecting, the patient was told to immediately notify me if she was experiencing any new onset of "ringing in the  ears, or metallic taste in the mouth". No attempts were made at seeking any paresthesias. Safe injection practices and needle disposal techniques used. Medications properly checked for expiration dates. SDV (single dose vial) medications used. After the completion of the procedure, all disposable equipment used was discarded in the proper designated medical waste containers. Local Anesthesia: Protocol guidelines were followed. The patient was positioned over the fluoroscopy table. The area was prepped in the usual manner. The time-out was completed. The target area was identified using fluoroscopy. A 12-in long, straight, sterile hemostat was used with fluoroscopic guidance to locate the targets for each level blocked. Once located, the skin was marked with an approved surgical skin marker. Once all sites were marked, the skin (epidermis, dermis, and hypodermis), as well as deeper tissues (fat, connective tissue and muscle) were infiltrated with a small amount of a short-acting local anesthetic, loaded on a 10cc syringe with a 25G, 1.5-in  Needle. An appropriate amount of time was allowed for local anesthetics to take effect before proceeding to the next step. Local Anesthetic: Lidocaine 2.0% The unused portion of the local anesthetic was discarded in the proper designated containers. Technical explanation of process:  Radiofrequency Ablation (RFA) L2 Medial Branch Nerve RFA: The target area for the L2 medial branch is at the junction of the postero-lateral aspect of the superior articular process and the superior, posterior, and medial edge of the transverse  process of L3. Under fluoroscopic guidance, a Radiofrequency needle was inserted until contact was made with os over the superior postero-lateral aspect of the pedicular shadow (target area). Sensory and motor testing was conducted to properly adjust the position of the needle. Once satisfactory placement of the needle was achieved, the numbing solution was slowly injected after negative aspiration for blood. 2.0 mL of the nerve block solution was injected without difficulty or complication. After waiting for at least 3 minutes, the ablation was performed. Once completed, the needle was removed intact. L3 Medial Branch Nerve RFA: The target area for the L3 medial branch is at the junction of the postero-lateral aspect of the superior articular process and the superior, posterior, and medial edge of the transverse process of L4. Under fluoroscopic guidance, a Radiofrequency needle was inserted until contact was made with os over the superior postero-lateral aspect of the pedicular shadow (target area). Sensory and motor testing was conducted to properly adjust the position of the needle. Once satisfactory placement of the needle was achieved, the numbing solution was slowly injected after negative aspiration for blood. 2.0 mL of the nerve block solution was injected without difficulty or complication. After waiting for at least 3 minutes, the ablation was performed. Once completed, the needle was removed intact. L4 Medial Branch Nerve RFA: The target area for the L4 medial branch is at the junction of the postero-lateral aspect of the superior articular process and the superior, posterior, and medial edge of the transverse process of L5. Under fluoroscopic guidance, a Radiofrequency needle was inserted until contact was made with os over the superior postero-lateral aspect of the pedicular shadow (target area). Sensory and motor testing was conducted to properly adjust the position of the needle. Once satisfactory  placement of the needle was achieved, the numbing solution was slowly injected after negative aspiration for blood. 2.0 mL of the nerve block solution was injected without difficulty or complication. After waiting for at least 3 minutes, the ablation was performed. Once completed, the needle was removed intact. L5 Medial Branch Nerve RFA: The target area  for the L5 medial branch is at the junction of the postero-lateral aspect of the superior articular process of S1 and the superior, posterior, and medial edge of the sacral ala. Under fluoroscopic guidance, a Radiofrequency needle was inserted until contact was made with os over the superior postero-lateral aspect of the pedicular shadow (target area). Sensory and motor testing was conducted to properly adjust the position of the needle. Once satisfactory placement of the needle was achieved, the numbing solution was slowly injected after negative aspiration for blood. 2.0 mL of the nerve block solution was injected without difficulty or complication. After waiting for at least 3 minutes, the ablation was performed. Once completed, the needle was removed intact. S1 Primary Dorsal Rami and Lateral Branch Nerve RFA: The target area for the S1 medial branch is located inferior to the junction of the S1 superior articular process and the L5 inferior articular process, posterior, inferior, and lateral to the 6 o'clock position of the L5-S1 facet joint, just superior to the S1 posterior foramen. Under fluoroscopic guidance, the Radiofrequency needle was advanced until contact was made with os over the Target area. Sensory and motor testing was conducted to properly adjust the position of the needle. Once satisfactory placement of the needle was achieved, the numbing solution was slowly injected after negative aspiration for blood. 2.0 mL of the nerve block solution was injected without difficulty or complication. After waiting for at least 3 minutes, the ablation was  performed. Once completed, the needle was removed intact. S2 Primary Dorsal Rami and Lateral Branch Nerve RFA: The target area for the S2 medial branch is at the posterior superior lateral of the S2 posterior neural foramen. Under fluoroscopic guidance, the Radiofrequency needle was advanced until contact was made with os over the Target area. Sensory and motor testing was conducted to properly adjust the position of the needle. Once satisfactory placement of the needle was achieved, the numbing solution was slowly injected after negative aspiration for blood. 2.0 mL of the nerve block solution was injected without difficulty or complication. After waiting for at least 3 minutes, the ablation was performed. Once completed, the needle was removed intact. S3 Primary Dorsal Rami and Lateral Branch Nerve RFA: The target area for the S3 medial branch is at the posterior superior lateral of the S3 posterior neural foramen. Under fluoroscopic guidance, the Radiofrequency needle was advanced until contact was made with os over the Target area. Sensory and motor testing was conducted to properly adjust the position of the needle. Once satisfactory placement of the needle was achieved, the numbing solution was slowly injected after negative aspiration for blood. 2.0 mL of the nerve block solution was injected without difficulty or complication. After waiting for at least 3 minutes, the ablation was performed. Once completed, the needle was removed intact. Radiofrequency lesioning (ablation):  Radiofrequency Generator: NeuroTherm [id number] Sensory Stimulation Parameters: 50 Hz was used to locate & identify the nerve, making sure that the needle was positioned such that there was no sensory stimulation below 0.3 V or above 0.7 V. Motor Stimulation Parameters: 2 Hz was used to evaluate the motor component. Care was taken not to lesion any nerves that demonstrated motor stimulation of the lower extremities at an output of less  than 2.5 times that of the sensory threshold, or a maximum of 2.0 V. Lesioning Technique Parameters: Standard Radiofrequency settings. (Not bipolar or pulsed.) Temperature Settings: 80 degrees C Lesioning time: 60 seconds Intra-operative Compliance: Compliant Materials & Medications: Needle(s) (Electrode/Cannula) Type: Teflon-coated,  curved tip, Radiofrequency needle(s) Gauge: 22G Length: 10cm Numbing solution: 0.2% PF-Ropivacaine + Triamcinolone (40 mg/mL) diluted to a final concentration of 4 mg of Triamcinolone/mL of Ropivacaine The unused portion of the solution was discarded in the proper designated containers.  Once the entire procedure was completed, the treated area was cleaned, making sure to leave some of the prepping solution back to take advantage of its long term bactericidal properties.    Illustration of the posterior view of the lumbar spine and the posterior neural structures. Laminae of L2 through S1 are labeled. DPRL5, dorsal primary ramus of L5; DPRS1, dorsal primary ramus of S1; DPR3, dorsal primary ramus of L3; FJ, facet (zygapophyseal) joint L3-L4; I, inferior articular process of L4; LB1, lateral branch of dorsal primary ramus of L1; IAB, inferior articular branches from L3 medial branch (supplies L4-L5 facet joint); IBP, intermediate branch plexus; MB3, medial branch of dorsal primary ramus of L3; NR3, third lumbar nerve root; S, superior articular process of L5; SAB, superior articular branches from L4 (supplies L4-5 facet joint also); TP3, transverse process of L3.  Vitals:   01/09/18 1216 01/09/18 1225 01/09/18 1235 01/09/18 1245  BP: 139/71 (!) 147/56 (!) 160/66 (!) 152/72  Pulse:      Resp: 15 16 (!) 22 (!) 22  Temp: (!) 97 F (36.1 C)     TempSrc: Temporal     SpO2: 98% 100% 99% 100%  Weight:      Height:        Start Time: 1133 hrs. End Time: 1206 hrs.  Imaging Guidance (Spinal):  Type of Imaging Technique: Fluoroscopy Guidance (Spinal) Indication(s):  Assistance in needle guidance and placement for procedures requiring needle placement in or near specific anatomical locations not easily accessible without such assistance. Exposure Time: Please see nurses notes. Contrast: None used. Fluoroscopic Guidance: I was personally present during the use of fluoroscopy. "Tunnel Vision Technique" used to obtain the best possible view of the target area. Parallax error corrected before commencing the procedure. "Direction-depth-direction" technique used to introduce the needle under continuous pulsed fluoroscopy. Once target was reached, antero-posterior, oblique, and lateral fluoroscopic projection used confirm needle placement in all planes. Images permanently stored in EMR. Interpretation: No contrast injected. I personally interpreted the imaging intraoperatively. Adequate needle placement confirmed in multiple planes. Permanent images saved into the patient's record.  Antibiotic Prophylaxis:   Anti-infectives (From admission, onward)   None     Indication(s): None identified  Post-operative Assessment:  Post-procedure Vital Signs:  Pulse Rate: 98 Temp: (!) 97 F (36.1 C) Resp: (!) 22 BP: (!) 152/72 SpO2: 100 %  EBL: None  Complications: No immediate post-treatment complications observed by team, or reported by patient.  Note: The patient tolerated the entire procedure well. A repeat set of vitals were taken after the procedure and the patient was kept under observation following institutional policy, for this type of procedure. Post-procedural neurological assessment was performed, showing return to baseline, prior to discharge. The patient was provided with post-procedure discharge instructions, including a section on how to identify potential problems. Should any problems arise concerning this procedure, the patient was given instructions to immediately contact us, at any time, without hesitation. In any case, we plan to contact the patient  by telephone for a follow-up status report regarding this interventional procedure.  Comments:  No additional relevant information.  Plan of Care   Possible POC:  Return in 2 weeks for a right-sided lumbar facet + right-sided sacroiliac joint RFA under fluoroscopic guidance  and IV sedation    Imaging Orders     DG C-Arm 1-60 Min-No Report  Procedure Orders     Radiofrequency,Lumbar     Radiofrequency Sacroiliac Joint     Radiofrequency,Lumbar     Radiofrequency Sacroiliac Joint  Medications ordered for procedure: Meds ordered this encounter  Medications  . midazolam (VERSED) 5 MG/5ML injection 1-2 mg    Make sure Flumazenil is available in the pyxis when using this medication. If oversedation occurs, administer 0.2 mg IV over 15 sec. If after 45 sec no response, administer 0.2 mg again over 1 min; may repeat at 1 min intervals; not to exceed 4 doses (1 mg)  . fentaNYL (SUBLIMAZE) injection 25-50 mcg    Make sure Narcan is available in the pyxis when using this medication. In the event of respiratory depression (RR< 8/min): Titrate NARCAN (naloxone) in increments of 0.1 to 0.2 mg IV at 2-3 minute intervals, until desired degree of reversal.  . lactated ringers infusion 1,000 mL  . lidocaine (XYLOCAINE) 2 % (with pres) injection 200 mg  . ropivacaine (PF) 2 mg/mL (0.2%) (NAROPIN) injection 18 mL  . triamcinolone acetonide (KENALOG-40) injection 80 mg  . traMADol (ULTRAM) 50 MG tablet    Sig: Take 1 tablet (50 mg total) by mouth every 6 (six) hours as needed for severe pain.    Dispense:  120 tablet    Refill:  0    Do not place this medication, or any other prescription from our practice, on "Automatic Refill". Patient may have prescription filled one day early if pharmacy is closed on scheduled refill date. Do not fill until: 01/09/18 To last until: 02/08/18   Medications administered: We administered midazolam, fentaNYL, lactated ringers, lidocaine, ropivacaine (PF) 2 mg/mL  (0.2%), and triamcinolone acetonide.  See the medical record for exact dosing, route, and time of administration.  New Prescriptions   TRAMADOL (ULTRAM) 50 MG TABLET    Take 1 tablet (50 mg total) by mouth every 6 (six) hours as needed for severe pain.   Disposition: Discharge home  Discharge Date & Time: 01/09/2018; 1251 hrs.   Physician-requested Follow-up: Return in about 2 weeks (around 01/23/2018) for contralateral RFA (2 wks): (R) L-FCT + (R) SI RFA.  Future Appointments  Date Time Provider [place]  01/15/2018  9:40 AM [facility]  01/30/2018  2:15 PM Milinda Pointer, MD [facility] None  02/23/2018  9:15 AM [facility]  02/26/2018 11:00 AM Einar Pheasant, MD [facility]  11/06/2018  1:00 PM O'Brien-Blaney, Bryson Corona, LPN [facility]   Primary Care Physician: Einar Pheasant, MD Location: [facility] Note by: Gaspar Cola, MD Date: 01/09/2018; Time: 1:10 PM  Disclaimer:  Medicine is not an exact science. The only guarantee in medicine is that nothing is guaranteed. It is important to note that the decision to proceed with this intervention was based on the information collected from the patient. The Data and conclusions were drawn from the patient's questionnaire, the interview, and the physical examination. Because the information was provided in large part by the patient, it cannot be guaranteed that it has not been purposely or unconsciously manipulated. Every effort has been made to obtain as much relevant data as possible for this evaluation. It is important to note that the conclusions that lead to this procedure are derived in large part from the available data. Always take into account that the treatment will also be dependent on availability of  resources and existing treatment guidelines, considered by other Pain Management Practitioners as being common knowledge and practice, at the time of the  intervention. For Medico-Legal purposes, it is also important to point out that variation in procedural techniques and pharmacological choices are the acceptable norm. The indications, contraindications, technique, and results of the above procedure should only be interpreted and judged by a Board-Certified Interventional Pain Specialist with extensive familiarity and expertise in the same exact procedure and technique.

## 2018-01-10 ENCOUNTER — Telehealth: Payer: Self-pay | Admitting: *Deleted

## 2018-01-10 NOTE — Telephone Encounter (Signed)
Spoke with patient re; procedure on yesterday.  Verbalizes no questions or concerns.  Transferred up front to change next appt  D/t a neurology appt.

## 2018-01-15 ENCOUNTER — Other Ambulatory Visit: Payer: Self-pay | Admitting: Internal Medicine

## 2018-01-15 ENCOUNTER — Ambulatory Visit
Admission: RE | Admit: 2018-01-15 | Discharge: 2018-01-15 | Disposition: A | Discharge status: Home / Self Care | Payer: Medicare Other | Source: Ambulatory Visit | Attending: Internal Medicine | Admitting: Internal Medicine

## 2018-01-15 DIAGNOSIS — Z1239 Encounter for other screening for malignant neoplasm of breast: Secondary | ICD-10-CM

## 2018-01-15 DIAGNOSIS — Z1231 Encounter for screening mammogram for malignant neoplasm of breast: Secondary | ICD-10-CM | POA: Diagnosis not present

## 2018-01-17 ENCOUNTER — Telehealth: Payer: Self-pay | Admitting: Internal Medicine

## 2018-01-17 NOTE — Telephone Encounter (Signed)
Copied from [place] [phone]. Topic: Quick Communication - See Telephone Encounter >> Jan 17, 2018 11:54 AM Ahmed Prima L wrote: CRM for notification. See Telephone encounter for:   01/17/18.  Patient said she had her mammo on Monday and wants to know the results. Call back [phone]

## 2018-01-17 NOTE — Telephone Encounter (Signed)
Results have yet to be interpreted by PCP.

## 2018-01-17 NOTE — Telephone Encounter (Signed)
Pt called back again to check on results.

## 2018-01-17 NOTE — Telephone Encounter (Signed)
Please advise 

## 2018-01-18 NOTE — Telephone Encounter (Signed)
Please advise 

## 2018-01-18 NOTE — Telephone Encounter (Signed)
Notify pt that her mammogram is ok.

## 2018-01-19 NOTE — Telephone Encounter (Signed)
Patient called about the results of her mammo again.  She was told that her mammo was ok per Dr. Nicki Reaper and she was very happy to hear that.

## 2018-01-30 ENCOUNTER — Ambulatory Visit: Payer: Medicare Other | Admitting: Pain Medicine

## 2018-01-30 DIAGNOSIS — G500 Trigeminal neuralgia: Secondary | ICD-10-CM | POA: Diagnosis not present

## 2018-02-15 ENCOUNTER — Other Ambulatory Visit: Payer: Self-pay

## 2018-02-15 ENCOUNTER — Ambulatory Visit
Admission: RE | Admit: 2018-02-15 | Discharge: 2018-02-15 | Disposition: A | Discharge status: Home / Self Care | Payer: Medicare Other | Source: Ambulatory Visit | Attending: Pain Medicine | Admitting: Pain Medicine

## 2018-02-15 ENCOUNTER — Ambulatory Visit (HOSPITAL_BASED_OUTPATIENT_CLINIC_OR_DEPARTMENT_OTHER): Payer: Medicare Other | Admitting: Pain Medicine

## 2018-02-15 ENCOUNTER — Encounter: Payer: Self-pay | Admitting: Pain Medicine

## 2018-02-15 VITALS — BP 160/78 | HR 88 | Temp 97.9°F | Resp 24 | Ht 65.0 in | Wt 150.0 lb

## 2018-02-15 DIAGNOSIS — M533 Sacrococcygeal disorders, not elsewhere classified: Secondary | ICD-10-CM | POA: Insufficient documentation

## 2018-02-15 DIAGNOSIS — M5388 Other specified dorsopathies, sacral and sacrococcygeal region: Secondary | ICD-10-CM

## 2018-02-15 DIAGNOSIS — Z885 Allergy status to narcotic agent status: Secondary | ICD-10-CM | POA: Diagnosis not present

## 2018-02-15 DIAGNOSIS — G894 Chronic pain syndrome: Secondary | ICD-10-CM

## 2018-02-15 DIAGNOSIS — F419 Anxiety disorder, unspecified: Secondary | ICD-10-CM | POA: Insufficient documentation

## 2018-02-15 DIAGNOSIS — G8929 Other chronic pain: Secondary | ICD-10-CM

## 2018-02-15 DIAGNOSIS — M47817 Spondylosis without myelopathy or radiculopathy, lumbosacral region: Secondary | ICD-10-CM

## 2018-02-15 DIAGNOSIS — Z888 Allergy status to other drugs, medicaments and biological substances status: Secondary | ICD-10-CM | POA: Diagnosis not present

## 2018-02-15 DIAGNOSIS — M47816 Spondylosis without myelopathy or radiculopathy, lumbar region: Secondary | ICD-10-CM

## 2018-02-15 DIAGNOSIS — M545 Low back pain: Secondary | ICD-10-CM

## 2018-02-15 MED ORDER — ROPIVACAINE HCL 2 MG/ML IJ SOLN
9.0000 mL | Freq: Once | INTRAMUSCULAR | Status: AC
  Administered 2018-02-15: 10 mL via PERINEURAL
  Filled 2018-02-15: qty 10

## 2018-02-15 MED ORDER — ROPIVACAINE HCL 2 MG/ML IJ SOLN
9.0000 mL | Freq: Once | INTRAMUSCULAR | Status: AC
  Administered 2018-02-15: 10 mL via PERINEURAL

## 2018-02-15 MED ORDER — MIDAZOLAM HCL 5 MG/5ML IJ SOLN
1.0000 mg | INTRAMUSCULAR | Status: DC | PRN
  Administered 2018-02-15: 1.5 mg via INTRAVENOUS
  Filled 2018-02-15: qty 5

## 2018-02-15 MED ORDER — TRIAMCINOLONE ACETONIDE 40 MG/ML IJ SUSP
40.0000 mg | Freq: Once | INTRAMUSCULAR | Status: AC
  Administered 2018-02-15: 40 mg
  Filled 2018-02-15: qty 1

## 2018-02-15 MED ORDER — TRIAMCINOLONE ACETONIDE 40 MG/ML IJ SUSP
40.0000 mg | Freq: Once | INTRAMUSCULAR | Status: AC
  Administered 2018-02-15: 40 mg

## 2018-02-15 MED ORDER — LIDOCAINE HCL 2 % IJ SOLN
20.0000 mL | Freq: Once | INTRAMUSCULAR | Status: AC
  Administered 2018-02-15: 400 mg
  Filled 2018-02-15: qty 40

## 2018-02-15 MED ORDER — LACTATED RINGERS IV SOLN
1000.0000 mL | Freq: Once | INTRAVENOUS | Status: AC
  Administered 2018-02-15: 1000 mL via INTRAVENOUS

## 2018-02-15 MED ORDER — FENTANYL CITRATE (PF) 100 MCG/2ML IJ SOLN
25.0000 ug | INTRAMUSCULAR | Status: DC | PRN
  Administered 2018-02-15: 75 ug via INTRAVENOUS
  Filled 2018-02-15: qty 2

## 2018-02-15 MED ORDER — TRAMADOL HCL 50 MG PO TABS: 50.0000 mg | ORAL_TABLET | Freq: Four times a day (QID) | ORAL | 1 refills | Status: DC | PRN

## 2018-02-15 NOTE — Patient Instructions (Signed)

## 2018-02-15 NOTE — Progress Notes (Signed)
Patient's Name: Denise Barry  MRN: [id number]  Referring Provider: Einar Pheasant, MD  DOB: 1934-10-02  PCP: Einar Pheasant, MD  DOS: 02/15/2018  Note by: Gaspar Cola, MD  Service setting: Ambulatory outpatient  Specialty: Interventional Pain Management  Patient type: Established  Location: [facility] (AMB) Pain Management Facility  Visit type: Interventional Procedure   Primary Reason for Visit: Interventional Pain Management Treatment. CC: Back Pain (lower, bilateral)  Procedure:       Anesthesia, Analgesia, Anxiolysis:  Type: Thermal Lumbar Facet, Medial Branch & Sacroiliac joint Radiofrequency Ablation Region: Lumbosacral Level: L2, L3, L4, L5, S1, S2, & S3 Medial Branch Level(s). These levels will denervate the L3-4, L4-5, and the L5-S1 lumbar facet joints, as well as the posterior Sacroiliac Joint innervation. Primary Purpose: Therapeutic Region: Posterolateral Lumbosacral Spine Laterality: Right  Type: Moderate (Conscious) Sedation combined with Local Anesthesia Indication(s): Analgesia and Anxiety Route: Intravenous (IV) IV Access: Secured Sedation: Meaningful verbal contact was maintained at all times during the procedure  Local Anesthetic: Lidocaine 1-2%   Indications: 1. Spondylosis without myelopathy or radiculopathy, lumbosacral region   2. Lumbar facet syndrome (Bilateral) (L>R)   3. Other specified dorsopathies, sacral and sacrococcygeal region   4. Chronic sacroiliac joint pain (Bilateral) (L>R)   5. Chronic low back pain (Primary Area of Pain)  (Bilateral) (L>R)   6. Chronic pain syndrome    Denise Barry has been dealing with the above chronic pain for longer than three months and has either failed to respond, was unable to tolerate, or simply did not get enough benefit from other more conservative therapies including, but not limited to: 1. Over-the-counter medications 2. Anti-inflammatory medications 3. Muscle relaxants 4. Membrane stabilizers 5.  Opioids 6. Physical therapy 7. Modalities (Heat, ice, etc.) 8. Invasive techniques such as nerve blocks. Denise Barry has attained more than 50% relief of the pain from a series of diagnostic injections conducted in separate occasions.  Pain Score: Pre-procedure: 4 /10 Post-procedure: 0-No pain/10  Pre-op Assessment:  Denise Barry is a 84 y.o. (year old), female patient, seen today for interventional treatment. She  has a past surgical history that includes Back surgery (09/16/13); sclerosis (2000); Dilation and curettage of uterus (1971); Tubal ligation (1972); and Joint replacement. Denise Barry has a current medication list which includes the following prescription(s): acetaminophen, amlodipine, aspirin, eslicarbazepine acetate, estradiol, integra, fexofenadine, multi-vitamins, tizanidine, and tramadol, and the following Facility-Administered Medications: fentanyl and midazolam. Her primarily concern today is the Back Pain (lower, bilateral)  Initial Vital Signs:  Pulse/HCG Rate: 88ECG Heart Rate: 94 Temp: 98.1 F (36.7 C) Resp: 18 BP: 140/73 SpO2: 99 %  BMI: Estimated body mass index is 24.96 kg/m as calculated from the following:   Height as of this encounter: 5\' 5"  (1.651 m).   Weight as of this encounter: 150 lb (68 kg).  Risk Assessment: Allergies: Reviewed. She is allergic to codeine; cyclobenzaprine; methocarbamol; carbamazepine; and gabapentin.  Allergy Precautions: None required Coagulopathies: Reviewed. None identified.  Blood-thinner therapy: None at this time Active Infection(s): Reviewed. None identified. Denise Barry is afebrile  Site Confirmation: Denise Barry was asked to confirm the procedure and laterality before marking the site Procedure checklist: Completed Consent: Before the procedure and under the influence of no sedative(s), amnesic(s), or anxiolytics, the patient was informed of the treatment options, risks and possible complications. To fulfill our  ethical and legal obligations, as recommended by the American Medical Association's Code of Ethics, I have informed the patient of my clinical impression; the nature  and purpose of the treatment or procedure; the risks, benefits, and possible complications of the intervention; the alternatives, including doing nothing; the risk(s) and benefit(s) of the alternative treatment(s) or procedure(s); and the risk(s) and benefit(s) of doing nothing. The patient was provided information about the general risks and possible complications associated with the procedure. These may include, but are not limited to: failure to achieve desired goals, infection, bleeding, organ or nerve damage, allergic reactions, paralysis, and death. In addition, the patient was informed of those risks and complications associated to Spine-related procedures, such as failure to decrease pain; infection (i.e.: Meningitis, epidural or intraspinal abscess); bleeding (i.e.: epidural hematoma, subarachnoid hemorrhage, or any other type of intraspinal or peri-dural bleeding); organ or nerve damage (i.e.: Any type of peripheral nerve, nerve root, or spinal cord injury) with subsequent damage to sensory, motor, and/or autonomic systems, resulting in permanent pain, numbness, and/or weakness of one or several areas of the body; allergic reactions; (i.e.: anaphylactic reaction); and/or death. Furthermore, the patient was informed of those risks and complications associated with the medications. These include, but are not limited to: allergic reactions (i.e.: anaphylactic or anaphylactoid reaction(s)); adrenal axis suppression; blood sugar elevation that in diabetics may result in ketoacidosis or comma; water retention that in patients with history of congestive heart failure may result in shortness of breath, pulmonary edema, and decompensation with resultant heart failure; weight gain; swelling or edema; medication-induced neural toxicity; particulate  matter embolism and blood vessel occlusion with resultant organ, and/or nervous system infarction; and/or aseptic necrosis of one or more joints. Finally, the patient was informed that Medicine is not an exact science; therefore, there is also the possibility of unforeseen or unpredictable risks and/or possible complications that may result in a catastrophic outcome. The patient indicated having understood very clearly. We have given the patient no guarantees and we have made no promises. Enough time was given to the patient to ask questions, all of which were answered to the patient's satisfaction. Ms. Crouse has indicated that she wanted to continue with the procedure. Attestation: I, the ordering provider, attest that I have discussed with the patient the benefits, risks, side-effects, alternatives, likelihood of achieving goals, and potential problems during recovery for the procedure that I have provided informed consent. Date  Time: 02/15/2018  2:25 PM  Pre-Procedure Preparation:  Monitoring: As per clinic protocol. Respiration, ETCO2, SpO2, BP, heart rate and rhythm monitor placed and checked for adequate function Safety Precautions: Patient was assessed for positional comfort and pressure points before starting the procedure. Time-out: I initiated and conducted the "Time-out" before starting the procedure, as per protocol. The patient was asked to participate by confirming the accuracy of the "Time Out" information. Verification of the correct person, site, and procedure were performed and confirmed by me, the nursing staff, and the patient. "Time-out" conducted as per Joint Commission's Universal Protocol (UP.01.01.01). Time: 3154  Description of Procedure:       Position: Prone Laterality: Right Level: L2, L3, L4, L5, S1, S2, & S3 Medial Branch Level(s), at the L3-4, L4-5, and the L5-S1 lumbar facet joints, as well as the posterior Sacroiliac Joint. Area Prepped: Lumbosacral Prepping  solution: ChloraPrep (2% chlorhexidine gluconate and 70% isopropyl alcohol) Safety Precautions: Aspiration looking for blood return was conducted prior to all injections. At no point did we inject any substances, as a needle was being advanced. Before injecting, the patient was told to immediately notify me if she was experiencing any new onset of "ringing in the ears,  or metallic taste in the mouth". No attempts were made at seeking any paresthesias. Safe injection practices and needle disposal techniques used. Medications properly checked for expiration dates. SDV (single dose vial) medications used. After the completion of the procedure, all disposable equipment used was discarded in the proper designated medical waste containers. Local Anesthesia: Protocol guidelines were followed. The patient was positioned over the fluoroscopy table. The area was prepped in the usual manner. The time-out was completed. The target area was identified using fluoroscopy. A 12-in long, straight, sterile hemostat was used with fluoroscopic guidance to locate the targets for each level blocked. Once located, the skin was marked with an approved surgical skin marker. Once all sites were marked, the skin (epidermis, dermis, and hypodermis), as well as deeper tissues (fat, connective tissue and muscle) were infiltrated with a small amount of a short-acting local anesthetic, loaded on a 10cc syringe with a 25G, 1.5-in  Needle. An appropriate amount of time was allowed for local anesthetics to take effect before proceeding to the next step. Local Anesthetic: Lidocaine 2.0% The unused portion of the local anesthetic was discarded in the proper designated containers. Technical explanation of process:  Radiofrequency Ablation (RFA) L2 Medial Branch Nerve RFA: The target area for the L2 medial branch is at the junction of the postero-lateral aspect of the superior articular process and the superior, posterior, and medial edge of the  transverse process of L3. Under fluoroscopic guidance, a Radiofrequency needle was inserted until contact was made with os over the superior postero-lateral aspect of the pedicular shadow (target area). Sensory and motor testing was conducted to properly adjust the position of the needle. Once satisfactory placement of the needle was achieved, the numbing solution was slowly injected after negative aspiration for blood. 2.0 mL of the nerve block solution was injected without difficulty or complication. After waiting for at least 3 minutes, the ablation was performed. Once completed, the needle was removed intact. L3 Medial Branch Nerve RFA: The target area for the L3 medial branch is at the junction of the postero-lateral aspect of the superior articular process and the superior, posterior, and medial edge of the transverse process of L4. Under fluoroscopic guidance, a Radiofrequency needle was inserted until contact was made with os over the superior postero-lateral aspect of the pedicular shadow (target area). Sensory and motor testing was conducted to properly adjust the position of the needle. Once satisfactory placement of the needle was achieved, the numbing solution was slowly injected after negative aspiration for blood. 2.0 mL of the nerve block solution was injected without difficulty or complication. After waiting for at least 3 minutes, the ablation was performed. Once completed, the needle was removed intact. L4 Medial Branch Nerve RFA: The target area for the L4 medial branch is at the junction of the postero-lateral aspect of the superior articular process and the superior, posterior, and medial edge of the transverse process of L5. Under fluoroscopic guidance, a Radiofrequency needle was inserted until contact was made with os over the superior postero-lateral aspect of the pedicular shadow (target area). Sensory and motor testing was conducted to properly adjust the position of the needle. Once  satisfactory placement of the needle was achieved, the numbing solution was slowly injected after negative aspiration for blood. 2.0 mL of the nerve block solution was injected without difficulty or complication. After waiting for at least 3 minutes, the ablation was performed. Once completed, the needle was removed intact. L5 Medial Branch Nerve RFA: The target area for  the L5 medial branch is at the junction of the postero-lateral aspect of the superior articular process of S1 and the superior, posterior, and medial edge of the sacral ala. Under fluoroscopic guidance, a Radiofrequency needle was inserted until contact was made with os over the superior postero-lateral aspect of the pedicular shadow (target area). Sensory and motor testing was conducted to properly adjust the position of the needle. Once satisfactory placement of the needle was achieved, the numbing solution was slowly injected after negative aspiration for blood. 2.0 mL of the nerve block solution was injected without difficulty or complication. After waiting for at least 3 minutes, the ablation was performed. Once completed, the needle was removed intact. S1 Primary Dorsal Rami and Lateral Branch Nerve RFA: The target area for the S1 medial branch is located inferior to the junction of the S1 superior articular process and the L5 inferior articular process, posterior, inferior, and lateral to the 6 o'clock position of the L5-S1 facet joint, just superior to the S1 posterior foramen. Under fluoroscopic guidance, the Radiofrequency needle was advanced until contact was made with os over the Target area. Sensory and motor testing was conducted to properly adjust the position of the needle. Once satisfactory placement of the needle was achieved, the numbing solution was slowly injected after negative aspiration for blood. 2.0 mL of the nerve block solution was injected without difficulty or complication. After waiting for at least 3 minutes, the  ablation was performed. Once completed, the needle was removed intact. S2 Primary Dorsal Rami and Lateral Branch Nerve RFA: The target area for the S2 medial branch is at the posterior superior lateral of the S2 posterior neural foramen. Under fluoroscopic guidance, the Radiofrequency needle was advanced until contact was made with os over the Target area. Sensory and motor testing was conducted to properly adjust the position of the needle. Once satisfactory placement of the needle was achieved, the numbing solution was slowly injected after negative aspiration for blood. 2.0 mL of the nerve block solution was injected without difficulty or complication. After waiting for at least 3 minutes, the ablation was performed. Once completed, the needle was removed intact. S3 Primary Dorsal Rami and Lateral Branch Nerve RFA: The target area for the S3 medial branch is at the posterior superior lateral of the S3 posterior neural foramen. Under fluoroscopic guidance, the Radiofrequency needle was advanced until contact was made with os over the Target area. Sensory and motor testing was conducted to properly adjust the position of the needle. Once satisfactory placement of the needle was achieved, the numbing solution was slowly injected after negative aspiration for blood. 2.0 mL of the nerve block solution was injected without difficulty or complication. After waiting for at least 3 minutes, the ablation was performed. Once completed, the needle was removed intact. Radiofrequency lesioning (ablation):  Radiofrequency Generator: NeuroTherm [id number] Sensory Stimulation Parameters: 50 Hz was used to locate & identify the nerve, making sure that the needle was positioned such that there was no sensory stimulation below 0.3 V or above 0.7 V. Motor Stimulation Parameters: 2 Hz was used to evaluate the motor component. Care was taken not to lesion any nerves that demonstrated motor stimulation of the lower extremities at an  output of less than 2.5 times that of the sensory threshold, or a maximum of 2.0 V. Lesioning Technique Parameters: Standard Radiofrequency settings. (Not bipolar or pulsed.) Temperature Settings: 80 degrees C Lesioning time: 60 seconds Intra-operative Compliance: Compliant Materials & Medications: Needle(s) (Electrode/Cannula) Type: Teflon-coated, curved  tip, Radiofrequency needle(s) Gauge: 22G Length: 10cm Numbing solution: 0.2% PF-Ropivacaine + Triamcinolone (40 mg/mL) diluted to a final concentration of 4 mg of Triamcinolone/mL of Ropivacaine The unused portion of the solution was discarded in the proper designated containers.  Once the entire procedure was completed, the treated area was cleaned, making sure to leave some of the prepping solution back to take advantage of its long term bactericidal properties.    Illustration of the posterior view of the lumbar spine and the posterior neural structures. Laminae of L2 through S1 are labeled. DPRL5, dorsal primary ramus of L5; DPRS1, dorsal primary ramus of S1; DPR3, dorsal primary ramus of L3; FJ, facet (zygapophyseal) joint L3-L4; I, inferior articular process of L4; LB1, lateral branch of dorsal primary ramus of L1; IAB, inferior articular branches from L3 medial branch (supplies L4-L5 facet joint); IBP, intermediate branch plexus; MB3, medial branch of dorsal primary ramus of L3; NR3, third lumbar nerve root; S, superior articular process of L5; SAB, superior articular branches from L4 (supplies L4-5 facet joint also); TP3, transverse process of L3.  Vitals:   02/15/18 1537 02/15/18 1545 02/15/18 1555 02/15/18 1605  BP: (!) 152/70 (!) 147/66 133/77 (!) 160/78  Pulse:      Resp: 13 19 20  (!) 24  Temp:  97.9 F (36.6 C)    TempSrc:      SpO2: 99% 99% 98% 98%  Weight:      Height:        Start Time: 1514 hrs. End Time: 1535 hrs.  Imaging Guidance (Spinal):  Type of Imaging Technique: Fluoroscopy Guidance (Spinal) Indication(s):  Assistance in needle guidance and placement for procedures requiring needle placement in or near specific anatomical locations not easily accessible without such assistance. Exposure Time: Please see nurses notes. Contrast: None used. Fluoroscopic Guidance: I was personally present during the use of fluoroscopy. "Tunnel Vision Technique" used to obtain the best possible view of the target area. Parallax error corrected before commencing the procedure. "Direction-depth-direction" technique used to introduce the needle under continuous pulsed fluoroscopy. Once target was reached, antero-posterior, oblique, and lateral fluoroscopic projection used confirm needle placement in all planes. Images permanently stored in EMR. Interpretation: No contrast injected. I personally interpreted the imaging intraoperatively. Adequate needle placement confirmed in multiple planes. Permanent images saved into the patient's record.  Antibiotic Prophylaxis:   Anti-infectives (From admission, onward)   None     Indication(s): None identified  Post-operative Assessment:  Post-procedure Vital Signs:  Pulse/HCG Rate: 8888 Temp: 97.9 F (36.6 C) Resp: (!) 24 BP: (!) 160/78 SpO2: 98 %  EBL: None  Complications: No immediate post-treatment complications observed by team, or reported by patient.  Note: The patient tolerated the entire procedure well. A repeat set of vitals were taken after the procedure and the patient was kept under observation following institutional policy, for this type of procedure. Post-procedural neurological assessment was performed, showing return to baseline, prior to discharge. The patient was provided with post-procedure discharge instructions, including a section on how to identify potential problems. Should any problems arise concerning this procedure, the patient was given instructions to immediately contact us, at any time, without hesitation. In any case, we plan to contact the  patient by telephone for a follow-up status report regarding this interventional procedure.  Comments:  No additional relevant information.  Plan of Care    Imaging Orders     DG C-Arm 1-60 Min-No Report  Procedure Orders     Radiofrequency,Lumbar  Radiofrequency Sacroiliac Joint  Medications ordered for procedure: Meds ordered this encounter  Medications  . lidocaine (XYLOCAINE) 2 % (with pres) injection 400 mg  . midazolam (VERSED) 5 MG/5ML injection 1-2 mg    Make sure Flumazenil is available in the pyxis when using this medication. If oversedation occurs, administer 0.2 mg IV over 15 sec. If after 45 sec no response, administer 0.2 mg again over 1 min; may repeat at 1 min intervals; not to exceed 4 doses (1 mg)  . fentaNYL (SUBLIMAZE) injection 25-50 mcg    Make sure Narcan is available in the pyxis when using this medication. In the event of respiratory depression (RR< 8/min): Titrate NARCAN (naloxone) in increments of 0.1 to 0.2 mg IV at 2-3 minute intervals, until desired degree of reversal.  . lactated ringers infusion 1,000 mL  . ropivacaine (PF) 2 mg/mL (0.2%) (NAROPIN) injection 9 mL  . triamcinolone acetonide (KENALOG-40) injection 40 mg  . ropivacaine (PF) 2 mg/mL (0.2%) (NAROPIN) injection 9 mL  . triamcinolone acetonide (KENALOG-40) injection 40 mg  . traMADol (ULTRAM) 50 MG tablet    Sig: Take 1 tablet (50 mg total) by mouth every 6 (six) hours as needed for severe pain.    Dispense:  120 tablet    Refill:  1    Do not place this medication, or any other prescription from our practice, on "Automatic Refill". Patient may have prescription filled one day early if pharmacy is closed on scheduled refill date. Do not fill until: 02/15/18 To last until: 04/16/18   Medications administered: We administered lidocaine, midazolam, fentaNYL, lactated ringers, ropivacaine (PF) 2 mg/mL (0.2%), triamcinolone acetonide, ropivacaine (PF) 2 mg/mL (0.2%), and triamcinolone  acetonide.  See the medical record for exact dosing, route, and time of administration.  New Prescriptions   No medications on file   Disposition: Discharge home  Discharge Date & Time: 02/15/2018; 1610 hrs.   Physician-requested Follow-up: Return for Post-RFA eval (6 wks), w/ Dionisio David, NP.  Future Appointments  Date Time Provider [place]  02/22/2018 10:00 AM [facility]  02/26/2018 11:00 AM Einar Pheasant, MD [facility]  03/29/2018 12:45 PM Vevelyn Francois, NP [facility] None  11/06/2018  1:00 PM O'Brien-Blaney, Bryson Corona, LPN [facility]   Primary Care Physician: Einar Pheasant, MD Location: [facility] Note by: Gaspar Cola, MD Date: 02/15/2018; Time: 4:14 PM  Disclaimer:  Medicine is not an Chief Strategy Officer. The only guarantee in medicine is that nothing is guaranteed. It is important to note that the decision to proceed with this intervention was based on the information collected from the patient. The Data and conclusions were drawn from the patient's questionnaire, the interview, and the physical examination. Because the information was provided in large part by the patient, it cannot be guaranteed that it has not been purposely or unconsciously manipulated. Every effort has been made to obtain as much relevant data as possible for this evaluation. It is important to note that the conclusions that lead to this procedure are derived in large part from the available data. Always take into account that the treatment will also be dependent on availability of resources and existing treatment guidelines, considered by other Pain Management Practitioners as being common knowledge and practice, at the time of the intervention. For Medico-Legal purposes, it is also important to point out that variation in procedural techniques and pharmacological choices are the acceptable norm. The indications, contraindications, technique, and  results of the above  procedure should only be interpreted and judged by a Board-Certified Interventional Pain Specialist with extensive familiarity and expertise in the same exact procedure and technique.

## 2018-02-16 ENCOUNTER — Telehealth: Payer: Self-pay

## 2018-02-16 NOTE — Telephone Encounter (Signed)
Post procedure phone call.  Attempted to call x 3 and line was busy.

## 2018-02-19 DIAGNOSIS — Z1283 Encounter for screening for malignant neoplasm of skin: Secondary | ICD-10-CM | POA: Diagnosis not present

## 2018-02-19 DIAGNOSIS — D0371 Melanoma in situ of right lower limb, including hip: Secondary | ICD-10-CM | POA: Diagnosis not present

## 2018-02-19 DIAGNOSIS — D692 Other nonthrombocytopenic purpura: Secondary | ICD-10-CM | POA: Diagnosis not present

## 2018-02-19 DIAGNOSIS — L578 Other skin changes due to chronic exposure to nonionizing radiation: Secondary | ICD-10-CM | POA: Diagnosis not present

## 2018-02-19 DIAGNOSIS — D1801 Hemangioma of skin and subcutaneous tissue: Secondary | ICD-10-CM | POA: Diagnosis not present

## 2018-02-19 DIAGNOSIS — D229 Melanocytic nevi, unspecified: Secondary | ICD-10-CM | POA: Diagnosis not present

## 2018-02-19 DIAGNOSIS — D485 Neoplasm of uncertain behavior of skin: Secondary | ICD-10-CM | POA: Diagnosis not present

## 2018-02-19 DIAGNOSIS — L812 Freckles: Secondary | ICD-10-CM | POA: Diagnosis not present

## 2018-02-19 DIAGNOSIS — L821 Other seborrheic keratosis: Secondary | ICD-10-CM | POA: Diagnosis not present

## 2018-02-22 ENCOUNTER — Other Ambulatory Visit (INDEPENDENT_AMBULATORY_CARE_PROVIDER_SITE_OTHER): Payer: Medicare Other

## 2018-02-22 DIAGNOSIS — D649 Anemia, unspecified: Secondary | ICD-10-CM

## 2018-02-22 LAB — CBC WITH DIFFERENTIAL/PLATELET
Basophils Absolute: 0 10*3/uL (ref 0.0–0.1)
Basophils Relative: 0.4 % (ref 0.0–3.0)
EOS PCT: 0.8 % (ref 0.0–5.0)
Eosinophils Absolute: 0.1 10*3/uL (ref 0.0–0.7)
HEMATOCRIT: 28.2 % — AB (ref 36.0–46.0)
Hemoglobin: 9 g/dL — ABNORMAL LOW (ref 12.0–15.0)
LYMPHS PCT: 14.2 % (ref 12.0–46.0)
Lymphs Abs: 0.9 10*3/uL (ref 0.7–4.0)
MCHC: 31.9 g/dL (ref 30.0–36.0)
MCV: 70.2 fl — AB (ref 78.0–100.0)
MONOS PCT: 11.5 % (ref 3.0–12.0)
Monocytes Absolute: 0.8 10*3/uL (ref 0.1–1.0)
Neutro Abs: 4.9 10*3/uL (ref 1.4–7.7)
Neutrophils Relative %: 73.1 % (ref 43.0–77.0)
Platelets: 482 10*3/uL — ABNORMAL HIGH (ref 150.0–400.0)
RBC: 4.02 Mil/uL (ref 3.87–5.11)
RDW: 18.9 % — ABNORMAL HIGH (ref 11.5–15.5)
WBC: 6.7 10*3/uL (ref 4.0–10.5)

## 2018-02-22 LAB — FERRITIN: FERRITIN: 6.1 ng/mL — AB (ref 10.0–291.0)

## 2018-02-23 ENCOUNTER — Other Ambulatory Visit: Payer: Medicare Other

## 2018-02-26 ENCOUNTER — Ambulatory Visit (INDEPENDENT_AMBULATORY_CARE_PROVIDER_SITE_OTHER): Payer: Medicare Other | Admitting: Internal Medicine

## 2018-02-26 DIAGNOSIS — M5136 Other intervertebral disc degeneration, lumbar region: Secondary | ICD-10-CM | POA: Diagnosis not present

## 2018-02-26 DIAGNOSIS — I10 Essential (primary) hypertension: Secondary | ICD-10-CM | POA: Diagnosis not present

## 2018-02-26 DIAGNOSIS — E7800 Pure hypercholesterolemia, unspecified: Secondary | ICD-10-CM | POA: Diagnosis not present

## 2018-02-26 DIAGNOSIS — D649 Anemia, unspecified: Secondary | ICD-10-CM | POA: Diagnosis not present

## 2018-02-26 DIAGNOSIS — M51369 Other intervertebral disc degeneration, lumbar region without mention of lumbar back pain or lower extremity pain: Secondary | ICD-10-CM

## 2018-02-26 DIAGNOSIS — G500 Trigeminal neuralgia: Secondary | ICD-10-CM

## 2018-02-26 MED ORDER — INTEGRA 62.5-62.5-40-3 MG PO CAPS: ORAL_CAPSULE | ORAL | 2 refills | Status: AC

## 2018-02-26 NOTE — Progress Notes (Signed)
Patient ID: Denise Barry, female   DOB: 1934/06/26, 84 y.o.   MRN: [id number]   Subjective:    Patient ID: Denise Barry, female    DOB: May 01, 1934, 84 y.o.   MRN: [id number]  HPI  Patient here for a scheduled follow up. Has a history of trigeminal neuralgia.  She is followed by neurology - Dr Theda Sers.  On aptiom.  Stable.  Recommended f/u in 6 months.  She is also followed by pain clinic for chronic low back pain.  S/p injection.  Stable.  Has f/u at the end of 02/2018.  Eating.  Doing better.  No chest pain.  No sob.  No acid reflux.  No abdominal pain.  Bowels moving.  Discussed labs.  hgb low.  Discussed further w/up and evaluation.  She declines.  Did agree to start [place].     Past Medical History:  Diagnosis Date  . Allergy   . Diverticulitis    11/18  . Hypercholesterolemia   . Hypertension   . Osteoarthritis   . Urinary incontinence    pessary in place  . Varicose veins    H/O   Past Surgical History:  Procedure Laterality Date  . BACK SURGERY  09/16/13  . DILATION AND CURETTAGE OF UTERUS  1971  . JOINT REPLACEMENT    . sclerosis  2000  . TUBAL LIGATION  1972   Family History  Problem Relation Age of Onset  . Heart disease Mother   . Diabetes Mother   . Hypertension Mother   . Heart disease Father   . Stroke Father   . Cancer Son        bladder  . COPD Son   . Breast cancer Neg Hx    Social History   Socioeconomic History  . Marital status: Widowed    Spouse name: Not on file  . Number of children: Not on file  . Years of education: Not on file  . Highest education level: Not on file  Occupational History  . Not on file  Social Needs  . Financial resource strain: Not on file  . Food insecurity:    Worry: Not on file    Inability: Not on file  . Transportation needs:    Medical: Not on file    Non-medical: Not on file  Tobacco Use  . Smoking status: Former Research scientist (life sciences)  . Smokeless tobacco: Never Used  Substance and Sexual Activity  .  Alcohol use: No    Alcohol/week: 0.0 oz  . Drug use: No  . Sexual activity: Never  Lifestyle  . Physical activity:    Days per week: Not on file    Minutes per session: Not on file  . Stress: Not on file  Relationships  . Social connections:    Talks on phone: Not on file    Gets together: Not on file    Attends religious service: Not on file    Active member of club or organization: Not on file    Attends meetings of clubs or organizations: Not on file    Relationship status: Not on file  Other Topics Concern  . Not on file  Social History Narrative  . Not on file    Outpatient Encounter Medications as of 02/26/2018  Medication Sig  . Acetaminophen (TYLENOL EXTRA STRENGTH PO) Take 1-2 tablets by mouth daily.  Marland Kitchen amLODipine (NORVASC) 10 MG tablet Take 1 tablet (10 mg total) by mouth daily.  Marland Kitchen aspirin 81 MG tablet Take  81 mg by mouth daily.  . Eslicarbazepine Acetate 400 MG TABS Take 1 tablet by mouth 3 (three) times daily.  Marland Kitchen estradiol (ESTRACE VAGINAL) 0.1 MG/GM vaginal cream Apply PRN  . fexofenadine (ALLEGRA) 180 MG tablet Take 180 mg by mouth as needed for allergies or rhinitis.  . Multiple Vitamin (MULTI-VITAMINS) TABS Take 1 tablet by mouth.   Marland Kitchen tiZANidine (ZANAFLEX) 4 MG tablet Take 1 tablet (4 mg total) 3 (three) times daily as needed by mouth for muscle spasms.  . traMADol (ULTRAM) 50 MG tablet Take 1 tablet (50 mg total) by mouth every 6 (six) hours as needed for severe pain.  . [DISCONTINUED] Fe Fum-FePoly-Vit C-Vit B3 (INTEGRA) 62.5-62.5-40-3 MG CAPS Take one capsule daily.  . Fe Fum-FePoly-Vit C-Vit B3 (INTEGRA) 62.5-62.5-40-3 MG CAPS Take one tablet daily   No facility-administered encounter medications on file as of 02/26/2018.     Review of Systems  Constitutional: Negative for appetite change and unexpected weight change.  HENT: Negative for congestion and sinus pressure.   Respiratory: Negative for cough, chest tightness and shortness of breath.     Cardiovascular: Negative for chest pain, palpitations and leg swelling.  Gastrointestinal: Negative for abdominal pain, diarrhea, nausea and vomiting.  Genitourinary: Negative for difficulty urinating and dysuria.  Musculoskeletal: Positive for back pain. Negative for joint swelling.  Skin: Negative for color change and rash.  Neurological: Negative for dizziness, light-headedness and headaches.  Psychiatric/Behavioral: Negative for agitation and dysphoric mood.       Objective:     Blood pressure rechecked by me:  134/72  Physical Exam  Constitutional: She appears well-developed and well-nourished. No distress.  HENT:  Nose: Nose normal.  Mouth/Throat: Oropharynx is clear and moist.  Neck: Neck supple. No thyromegaly present.  Cardiovascular: Normal rate and regular rhythm.  Pulmonary/Chest: Breath sounds normal. No respiratory distress. She has no wheezes.  Abdominal: Soft. Bowel sounds are normal. There is no tenderness.  Musculoskeletal: She exhibits no edema or tenderness.  Lymphadenopathy:    She has no cervical adenopathy.  Skin: No rash noted. No erythema.  Psychiatric: She has a normal mood and affect. Her behavior is normal.    BP 128/78 (BP Location: Left Arm, Patient Position: Sitting, Cuff Size: Normal)   Pulse 92   Temp 97.8 F (36.6 C) (Oral)   Resp 16   Wt 149 lb 12.8 oz (67.9 kg)   SpO2 96%   BMI 24.93 kg/m  Wt Readings from Last 3 Encounters:  02/26/18 149 lb 12.8 oz (67.9 kg)  02/15/18 150 lb (68 kg)  01/09/18 155 lb (70.3 kg)     Lab Results  Component Value Date   WBC 6.7 02/22/2018   HGB 9.0 (L) 02/22/2018   HCT 28.2 (L) 02/22/2018   PLT 482.0 (H) 02/22/2018   GLUCOSE 99 11/22/2017   CHOL 206 (H) 11/22/2017   TRIG 101.0 11/22/2017   HDL 74.00 11/22/2017   LDLCALC 112 (H) 11/22/2017   ALT 15 11/22/2017   AST 13 11/22/2017   NA 138 11/22/2017   K 4.6 11/22/2017   CL 103 11/22/2017   CREATININE 0.83 11/22/2017   BUN 29 (H) 11/22/2017    CO2 24 11/22/2017   TSH 2.21 07/05/2017   INR 1.0 02/03/2015    Dg C-arm 1-60 Min-no Report  Result Date: 02/15/2018 Fluoroscopy was utilized by the requesting physician.  No radiographic interpretation.       Assessment & Plan:   Problem List Items Addressed This Visit  Anemia    hgb decreased - back to 9.  Discussed with her today.  Discussed f/u GI evaluation.  She desires to hold at this time.  Did agree to start iron supplements.  If persistent decrease, discussed referral to hematology for iron infusion.        Relevant Medications   Fe Fum-FePoly-Vit C-Vit B3 (INTEGRA) 62.5-62.5-40-3 MG CAPS   Other Relevant Orders   CBC with Differential/Platelet   Ferritin   Chronic Trigeminal neuralgia (Right) (Chronic)    Followed by neurology.  On aptiom and doing well.        DDD (degenerative disc disease), lumbar (Chronic)    Followed by pain clinic.  S/p injection.        Essential hypertension, benign    Blood pressure under good control.  Continue same medication regimen.  Follow pressures.  Follow metabolic panel.        Relevant Orders   Basic metabolic panel   Hypercholesterolemia    Follow lipid panel.           Einar Pheasant, MD

## 2018-03-02 ENCOUNTER — Encounter: Payer: Self-pay | Admitting: Internal Medicine

## 2018-03-02 NOTE — Assessment & Plan Note (Signed)
Follow lipid panel.   

## 2018-03-02 NOTE — Assessment & Plan Note (Signed)
Followed by neurology.  On aptiom and doing well.

## 2018-03-02 NOTE — Assessment & Plan Note (Signed)
Blood pressure under good control.  Continue same medication regimen.  Follow pressures.  Follow metabolic panel.   

## 2018-03-02 NOTE — Assessment & Plan Note (Signed)
Followed by pain clinic.  S/p injection.

## 2018-03-02 NOTE — Assessment & Plan Note (Signed)
hgb decreased - back to 9.  Discussed with her today.  Discussed f/u GI evaluation.  She desires to hold at this time.  Did agree to start iron supplements.  If persistent decrease, discussed referral to hematology for iron infusion.

## 2018-03-05 DIAGNOSIS — D0371 Melanoma in situ of right lower limb, including hip: Secondary | ICD-10-CM | POA: Diagnosis not present

## 2018-03-05 DIAGNOSIS — L089 Local infection of the skin and subcutaneous tissue, unspecified: Secondary | ICD-10-CM | POA: Diagnosis not present

## 2018-03-06 ENCOUNTER — Encounter: Payer: Self-pay | Admitting: Nurse Practitioner

## 2018-03-06 ENCOUNTER — Other Ambulatory Visit: Payer: Self-pay

## 2018-03-06 ENCOUNTER — Ambulatory Visit: Payer: Medicare Other | Attending: Nurse Practitioner | Admitting: Nurse Practitioner

## 2018-03-06 VITALS — BP 129/74 | HR 85 | Temp 98.0°F | Resp 16 | Ht 65.5 in | Wt 149.0 lb

## 2018-03-06 DIAGNOSIS — G8929 Other chronic pain: Secondary | ICD-10-CM | POA: Diagnosis not present

## 2018-03-06 DIAGNOSIS — Z8249 Family history of ischemic heart disease and other diseases of the circulatory system: Secondary | ICD-10-CM | POA: Insufficient documentation

## 2018-03-06 DIAGNOSIS — M48061 Spinal stenosis, lumbar region without neurogenic claudication: Secondary | ICD-10-CM | POA: Insufficient documentation

## 2018-03-06 DIAGNOSIS — M7918 Myalgia, other site: Secondary | ICD-10-CM | POA: Diagnosis not present

## 2018-03-06 DIAGNOSIS — E7800 Pure hypercholesterolemia, unspecified: Secondary | ICD-10-CM | POA: Insufficient documentation

## 2018-03-06 DIAGNOSIS — I10 Essential (primary) hypertension: Secondary | ICD-10-CM | POA: Diagnosis not present

## 2018-03-06 DIAGNOSIS — Z79891 Long term (current) use of opiate analgesic: Secondary | ICD-10-CM | POA: Diagnosis not present

## 2018-03-06 DIAGNOSIS — R32 Unspecified urinary incontinence: Secondary | ICD-10-CM | POA: Insufficient documentation

## 2018-03-06 DIAGNOSIS — Z888 Allergy status to other drugs, medicaments and biological substances status: Secondary | ICD-10-CM | POA: Diagnosis not present

## 2018-03-06 DIAGNOSIS — Z09 Encounter for follow-up examination after completed treatment for conditions other than malignant neoplasm: Secondary | ICD-10-CM | POA: Insufficient documentation

## 2018-03-06 DIAGNOSIS — D649 Anemia, unspecified: Secondary | ICD-10-CM | POA: Insufficient documentation

## 2018-03-06 DIAGNOSIS — Z9889 Other specified postprocedural states: Secondary | ICD-10-CM | POA: Diagnosis not present

## 2018-03-06 DIAGNOSIS — Z79899 Other long term (current) drug therapy: Secondary | ICD-10-CM | POA: Diagnosis not present

## 2018-03-06 DIAGNOSIS — R55 Syncope and collapse: Secondary | ICD-10-CM | POA: Diagnosis not present

## 2018-03-06 DIAGNOSIS — Z809 Family history of malignant neoplasm, unspecified: Secondary | ICD-10-CM | POA: Insufficient documentation

## 2018-03-06 DIAGNOSIS — Z87891 Personal history of nicotine dependence: Secondary | ICD-10-CM | POA: Insufficient documentation

## 2018-03-06 DIAGNOSIS — M47816 Spondylosis without myelopathy or radiculopathy, lumbar region: Secondary | ICD-10-CM | POA: Diagnosis not present

## 2018-03-06 DIAGNOSIS — Z7982 Long term (current) use of aspirin: Secondary | ICD-10-CM | POA: Insufficient documentation

## 2018-03-06 DIAGNOSIS — K5732 Diverticulitis of large intestine without perforation or abscess without bleeding: Secondary | ICD-10-CM | POA: Diagnosis not present

## 2018-03-06 DIAGNOSIS — M5126 Other intervertebral disc displacement, lumbar region: Secondary | ICD-10-CM | POA: Diagnosis not present

## 2018-03-06 DIAGNOSIS — Z9851 Tubal ligation status: Secondary | ICD-10-CM | POA: Diagnosis not present

## 2018-03-06 DIAGNOSIS — Z833 Family history of diabetes mellitus: Secondary | ICD-10-CM | POA: Insufficient documentation

## 2018-03-06 DIAGNOSIS — Z825 Family history of asthma and other chronic lower respiratory diseases: Secondary | ICD-10-CM | POA: Insufficient documentation

## 2018-03-06 DIAGNOSIS — G894 Chronic pain syndrome: Secondary | ICD-10-CM

## 2018-03-06 DIAGNOSIS — E871 Hypo-osmolality and hyponatremia: Secondary | ICD-10-CM | POA: Diagnosis not present

## 2018-03-06 DIAGNOSIS — Z823 Family history of stroke: Secondary | ICD-10-CM | POA: Insufficient documentation

## 2018-03-06 DIAGNOSIS — Z885 Allergy status to narcotic agent status: Secondary | ICD-10-CM | POA: Insufficient documentation

## 2018-03-06 DIAGNOSIS — G500 Trigeminal neuralgia: Secondary | ICD-10-CM | POA: Diagnosis not present

## 2018-03-06 DIAGNOSIS — M5136 Other intervertebral disc degeneration, lumbar region: Secondary | ICD-10-CM | POA: Diagnosis not present

## 2018-03-06 DIAGNOSIS — M7989 Other specified soft tissue disorders: Secondary | ICD-10-CM | POA: Diagnosis not present

## 2018-03-06 DIAGNOSIS — M419 Scoliosis, unspecified: Secondary | ICD-10-CM | POA: Insufficient documentation

## 2018-03-06 NOTE — Progress Notes (Signed)
Safety precautions to be maintained throughout the outpatient stay will include: orient to surroundings, keep bed in low position, maintain call bell within reach at all times, provide assistance with transfer out of bed and ambulation.  

## 2018-03-06 NOTE — Patient Instructions (Signed)
____________________________________________________________________________________________  Medication Rules  Applies to: All patients receiving prescriptions (written or electronic).  Pharmacy of record: Pharmacy where electronic prescriptions will be sent. If written prescriptions are taken to a different pharmacy, please inform the nursing staff. The pharmacy listed in the electronic medical record should be the one where you would like electronic prescriptions to be sent.  Prescription refills: Only during scheduled appointments. Applies to both, written and electronic prescriptions.  NOTE: The following applies primarily to controlled substances (Opioid* Pain Medications).   Patient's responsibilities: 1. Pain Pills: Bring all pain pills to every appointment (except for procedure appointments). 2. Pill Bottles: Bring pills in original pharmacy bottle. Always bring newest bottle. Bring bottle, even if empty. 3. Medication refills: You are responsible for knowing and keeping track of what medications you need refilled. The day before your appointment, write a list of all prescriptions that need to be refilled. Bring that list to your appointment and give it to the admitting nurse. Prescriptions will be written only during appointments. If you forget a medication, it will not be "Called in", "Faxed", or "electronically sent". You will need to get another appointment to get these prescribed. 4. Prescription Accuracy: You are responsible for carefully inspecting your prescriptions before leaving our office. Have the discharge nurse carefully go over each prescription with you, before taking them home. Make sure that your name is accurately spelled, that your address is correct. Check the name and dose of your medication to make sure it is accurate. Check the number of pills, and the written instructions to make sure they are clear and accurate. Make sure that you are given enough medication to last  until your next medication refill appointment. 5. Taking Medication: Take medication as prescribed. Never take more pills than instructed. Never take medication more frequently than prescribed. Taking less pills or less frequently is permitted and encouraged, when it comes to controlled substances (written prescriptions).  6. Inform other Doctors: Always inform, all of your healthcare providers, of all the medications you take. 7. Pain Medication from other Providers: You are not allowed to accept any additional pain medication from any other Doctor or Healthcare provider. There are two exceptions to this rule. (see below) In the event that you require additional pain medication, you are responsible for notifying us, as stated below. 8. Medication Agreement: You are responsible for carefully reading and following our Medication Agreement. This must be signed before receiving any prescriptions from our practice. Safely store a copy of your signed Agreement. Violations to the Agreement will result in no further prescriptions. (Additional copies of our Medication Agreement are available upon request.) 9. Laws, Rules, & Regulations: All patients are expected to follow all Federal and State Laws, Statutes, Rules, & Regulations. Ignorance of the Laws does not constitute a valid excuse. The use of any illegal substances is prohibited. 10. Adopted CDC guidelines & recommendations: Target dosing levels will be at or below 60 MME/day. Use of benzodiazepines** is not recommended.  Exceptions: There are only two exceptions to the rule of not receiving pain medications from other Healthcare Providers. 1. Exception #1 (Emergencies): In the event of an emergency (i.e.: accident requiring emergency care), you are allowed to receive additional pain medication. However, you are responsible for: As soon as you are able, call our office [phone], at any time of the day or night, and leave a message stating your name, the  date and nature of the emergency, and the name and dose of the medication   prescribed. In the event that your call is answered by a member of our staff, make sure to document and save the date, time, and the name of the person that took your information.  2. Exception #2 (Planned Surgery): In the event that you are scheduled by another doctor or dentist to have any type of surgery or procedure, you are allowed (for a period no longer than 30 days), to receive additional pain medication, for the acute post-op pain. However, in this case, you are responsible for picking up a copy of our "Post-op Pain Management for Surgeons" handout, and giving it to your surgeon or dentist. This document is available at our office, and does not require an appointment to obtain it. Simply go to our office during business hours (Monday-Thursday from 8:00 AM to 4:00 PM) (Friday 8:00 AM to 12:00 Noon) or if you have a scheduled appointment with us, prior to your surgery, and ask for it by name. In addition, you will need to provide us with your name, name of your surgeon, type of surgery, and date of procedure or surgery.  *Opioid medications include: morphine, codeine, oxycodone, oxymorphone, hydrocodone, hydromorphone, meperidine, tramadol, tapentadol, buprenorphine, fentanyl, methadone. **Benzodiazepine medications include: diazepam (Valium), alprazolam (Xanax), clonazepam (Klonopine), lorazepam (Ativan), clorazepate (Tranxene), chlordiazepoxide (Librium), estazolam (Prosom), oxazepam (Serax), temazepam (Restoril), triazolam (Halcion) (Last updated: 12/28/2017) ____________________________________________________________________________________________   ____________________________________________________________________________________________  Appointment Policy Summary  It is our goal and responsibility to provide the medical community with assistance in the evaluation and management of patients with chronic pain.  Unfortunately our resources are limited. Because we do not have an unlimited amount of time, or available appointments, we are required to closely monitor and manage their use. The following rules exist to maximize their use:  Patient's responsibilities: 1. Punctuality:  At what time should I arrive? You should be physically present in our office 30 minutes before your scheduled appointment. Your scheduled appointment is with your assigned healthcare provider. However, it takes 5-10 minutes to be "checked-in", and another 15 minutes for the nurses to do the admission. If you arrive to our office at the time you were given for your appointment, you will end up being at least 20-25 minutes late to your appointment with the provider. 2. Tardiness:  What happens if I arrive only a few minutes after my scheduled appointment time? You will need to reschedule your appointment. The cutoff is your appointment time. This is why it is so important that you arrive at least 30 minutes before that appointment. If you have an appointment scheduled for 10:00 AM and you arrive at 10:01, you will be required to reschedule your appointment.  3. Plan ahead:  Always assume that you will encounter traffic on your way in. Plan for it. If you are dependent on a driver, make sure they understand these rules and the need to arrive early. 4. Other appointments and responsibilities:  Avoid scheduling any other appointments before or after your pain clinic appointments.  5. Be prepared:  Write down everything that you need to discuss with your healthcare provider and give this information to the admitting nurse. Write down the medications that you will need refilled. Bring your pills and bottles (even the empty ones), to all of your appointments, except for those where a procedure is scheduled. 6. No children or pets:  Find someone to take care of them. It is not appropriate to bring them in. 7. Scheduling changes:  We request  "advanced notification" of any changes or   cancellations. 8. Advanced notification:  Defined as a time period of more than 24 hours prior to the originally scheduled appointment. This allows for the appointment to be offered to other patients. 9. Rescheduling:  When a visit is rescheduled, it will require the cancellation of the original appointment. For this reason they both fall within the category of "Cancellations".  10. Cancellations:  They require advanced notification. Any cancellation less than 24 hours before the  appointment will be recorded as a "No Show". 11. No Show:  Defined as an unkept appointment where the patient failed to notify or declare to the practice their intention or inability to keep the appointment.  Corrective process for repeat offenders:  1. Tardiness: Three (3) episodes of rescheduling due to late arrivals will be recorded as one (1) "No Show". 2. Cancellation or reschedule: Three (3) cancellations or rescheduling will be recorded as one (1) "No Show". 3. "No Shows": Three (3) "No Shows" within a 12 month period will result in discharge from the practice. ____________________________________________________________________________________________   

## 2018-03-06 NOTE — Progress Notes (Addendum)
Patient's Name: Denise Barry  MRN: [id number]  Referring Provider: Einar Pheasant, MD  DOB: Nov 03, 1933  PCP: Einar Pheasant, MD  DOS: 03/06/2018  Note by: Vevelyn Francois NP  Service setting: Ambulatory outpatient  Specialty: Interventional Pain Management  Location: [facility] (AMB) Pain Management Facility    Patient type: Established    Primary Reason(s) for Visit: Evaluation of chronic illnesses with exacerbation, or progression (Level of risk: moderate) CC: Back Pain (lower)  HPI  Denise Barry is a 84 y.o. year old, female patient, who comes today for a follow-up evaluation. 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); Urinary incontinence; History of hip fracture (Left); Right leg swelling; Health care maintenance; Hyponatremia; History of shoulder fracture (Left); Syncope; Chronic low back pain (Primary Area of Pain)  (Bilateral) (L>R); Chronic pain syndrome; Opiate use; Other long term (current) drug therapy; Other specified health status; Other reduced mobility; Disorder of bone, unspecified; Acquired spondylolisthesis; Closed 2-part displaced fracture of surgical neck of left humerus; DDD (degenerative disc disease), lumbar; Spinal stenosis, unspecified region other than cervical; SUNCT (short unilateral neuralgiform headache, conjunctival inj/tear); Anemia; Old Compression fracture of L1 and L2; DDD (degenerative disc disease), thoracic; Scoliosis; Abnormal MRI, lumbar spine (07/25/2017); Chronic musculoskeletal pain; Lumbar Spondylosis; Lumbar facet syndrome (Bilateral) (L>R); Chronic sacroiliac joint pain (Bilateral) (L>R); Abdominal pain; Elevated WBC count; Granulocytosis; Diverticulitis of large intestine with perforation; Diverticulitis; Difficulty sleeping; Spondylosis without myelopathy or radiculopathy, lumbosacral region; and Other specified dorsopathies, sacral and sacrococcygeal  region on their problem list. Denise Barry was last seen on 01/08/2018. Her primarily concern today is the Back Pain (lower)  Pain Assessment: Location: Lower Back Radiating: denies Onset: More than a month ago Duration: Chronic pain Quality: Aching, Discomfort, Spasm Severity: 3 /10 (subjective, self-reported pain score)  Note: Reported level is compatible with observation.                          Effect on ADL: rest, projlonged walking Timing: Intermittent Modifying factors: heating pad, magnesium BP: 129/74  HR: 85  Further details on both, my assessment(s), as well as the proposed treatment plan, please see below. She is SP Right lumbar facet RFA. She states that she is having increased spasms in her back. She admits that it is mostly on the right. She is using the Tramadol daily. She is not longer taking the Tizanidine secondary to her Trigeminal Neuralgia treatment and the potential side effects. She is having constipation. She is using OTC which she states is effective.   Laboratory Chemistry  Inflammation Markers (CRP: Acute Phase) (ESR: Chronic Phase) Lab Results  Component Value Date   CRP 1.1 08/28/2017   ESRSEDRATE 5 08/28/2017   LATICACIDVEN 1.3 02/04/2015                         Rheumatology Markers No results found for: RF, ANA, Therisa [place], [facility]                      Renal Function Markers Lab Results  Component Value Date   BUN 13 03/29/2018   CREATININE 0.79 03/29/2018   GFRAA >60 10/06/2017   GFRNONAA 59 (L) 10/06/2017                              Hepatic Function Markers Lab Results  Component Value Date   AST 13 11/22/2017   ALT 15 11/22/2017   ALBUMIN 4.0 11/22/2017   ALKPHOS 61 11/22/2017   LIPASE 80 (H) 02/03/2015                        Electrolytes Lab Results  Component Value Date   NA 134 (L) 03/29/2018   K 4.6 03/29/2018   CL 97 03/29/2018   CALCIUM 9.6 03/29/2018   MG 2.2 10/06/2017   PHOS 4.1 10/06/2017                         Neuropathy Markers No results found for: VITAMINB12, FOLATE, HGBA1C, HIV                      Bone Pathology Markers Lab Results  Component Value Date   25OHVITD1 47 08/28/2017   25OHVITD2 <1.0 08/28/2017   25OHVITD3 47 08/28/2017                         Coagulation Parameters Lab Results  Component Value Date   INR 1.0 02/03/2015   LABPROT 13.3 02/03/2015   APTT 26.6 01/12/2014   PLT 499.0 (H) 03/29/2018                        Cardiovascular Markers Lab Results  Component Value Date   TROPONINI <0.03 02/03/2015   HGB 10.2 (L) 03/29/2018   HCT 33.3 (L) 03/29/2018                         CA Markers No results found for: CEA, CA125, LABCA2                      Note: Lab results reviewed.  Recent Diagnostic Imaging Review    Lumbosacral Imaging:  Lumbar DG 2-3 views:  Results for orders placed in visit on 03/23/17  DG Lumbar Spine 2-3 Views   Narrative CLINICAL DATA:  Low back pain  EXAM: LUMBAR SPINE - 2-3 VIEW  COMPARISON:  01/12/2014  FINDINGS: Postsurgical changes are again seen and stable. Mild scoliosis concave to the right is seen. Vertebral body height is well maintained with the exception of mild superior endplate compression at L2. This appears chronic in nature. Multilevel osteophytic changes are seen. Stable anterolisthesis at L4-5 is noted. No soft tissue abnormality is seen. Left hip replacement is noted.  IMPRESSION: Postsurgical and degenerative changes.  No acute abnormality noted.   Electronically Signed   By: Inez Catalina M.D.   On: 03/23/2017 15:06     Lumbar DG Bending views:  Results for orders placed during the hospital encounter of 09/25/17  DG Lumbar Spine Complete W/Bend   Narrative CLINICAL DATA:  Mid to lower lumbar back pain without radiculopathy  EXAM: LUMBAR SPINE - COMPLETE WITH BENDING VIEWS  COMPARISON:  03/23/2017  FINDINGS: Hypoplastic last rib pair.  5 non-rib-bearing lumbar  vertebra.  Prior posterior fusion L4-L5 with BILATERAL pedicle screws and bars.  Hardware appears intact without surrounding lucency.  L4-L5 disc prosthesis appears unchanged.  Anterior height loss of L1 appears grossly unchanged since previous exam.  Multilevel disc space narrowing and endplate spur formation pronounced at L1-L2 and L2-L3.  Vacuum phenomenon at L5-S1.  No definite acute fracture or bone destruction.  Minimal anterolisthesis at L4-L5 appears stable.  Retrolisthesis is presence at L3-L4, approximately 7.4 mm at neutral, decreased at 3.4 mm with flexion and increased 8.7 mm with extension.  Scattered facet degenerative changes lumbar spine.  IMPRESSION: Multilevel degenerative disc and facet disease changes of lumbar spine as above with evidence of prior L4-L5 posterior fusion.  Retrolisthesis at L3-L4 at neutral which decreases with flexion and slightly increases with extension.  Osseous demineralization with chronic superior endplate compression fracture deformity of L1.   Electronically Signed   By: Lavonia Dana M.D.   On: 09/25/2017 17:55    Complexity Note: Imaging results reviewed. Results shared with Denise Barry, using [employer].                         Meds   Current Outpatient Medications:  .  Acetaminophen (TYLENOL EXTRA STRENGTH PO), Take 1-2 tablets by mouth as needed. , Disp: , Rfl:  .  amLODipine (NORVASC) 10 MG tablet, Take 1 tablet (10 mg total) by mouth daily., Disp: 30 tablet, Rfl: 3 .  aspirin 81 MG tablet, Take 81 mg by mouth daily., Disp: , Rfl:  .  Eslicarbazepine Acetate 400 MG TABS, Take 1 tablet by mouth 3 (three) times daily., Disp: , Rfl:  .  estradiol (ESTRACE VAGINAL) 0.1 MG/GM vaginal cream, Apply PRN, Disp: , Rfl:  .  Fe Fum-FePoly-Vit C-Vit B3 (INTEGRA) 62.5-62.5-40-3 MG CAPS, Take one tablet daily, Disp: 30 capsule, Rfl: 2 .  fexofenadine (ALLEGRA) 180 MG tablet, Take 180 mg by mouth as needed for allergies or  rhinitis., Disp: , Rfl:  .  Multiple Vitamin (MULTI-VITAMINS) TABS, Take 1 tablet by mouth. , Disp: , Rfl:  .  traMADol (ULTRAM) 50 MG tablet, Take 1 tablet (50 mg total) by mouth every 6 (six) hours as needed for severe pain., Disp: 120 tablet, Rfl: 1  ROS  Constitutional: Denies any fever or chills Gastrointestinal: No reported hemesis, hematochezia, vomiting, or acute GI distress Musculoskeletal: Denies any acute onset joint swelling, redness, loss of ROM, or weakness Neurological: No reported episodes of acute onset apraxia, aphasia, dysarthria, agnosia, amnesia, paralysis, loss of coordination, or loss of consciousness  Allergies  Denise Barry is allergic to codeine; cyclobenzaprine; methocarbamol; carbamazepine; and gabapentin.  [place]  Drug: Denise Barry  reports that she does not use drugs. Alcohol:  reports that she does not drink alcohol. Tobacco:  reports that she has quit smoking. She has never used smokeless tobacco. Medical:  has a past medical history of Allergy, Diverticulitis, Hypercholesterolemia, Hypertension, Osteoarthritis, Urinary incontinence, and Varicose veins. Surgical: Denise Barry  has a past surgical history that includes Back surgery (09/16/13); sclerosis (2000); Dilation and curettage of uterus (1971); Tubal ligation (1972); and Joint replacement. Family: family history includes COPD in her son; Cancer in her son; Diabetes in her mother; Heart disease in her father and mother; Hypertension in her mother; Stroke in her father.  Constitutional Exam  General appearance: Well nourished, well developed, and well hydrated. In no apparent acute distress Vitals:   03/06/18 1031  BP: 129/74  Pulse: 85  Resp: 16  Temp: 98 F (36.7 C)  SpO2: 100%  Weight: 149 lb (67.6 kg)  Height: 5' 5.5" (1.664 m)   BMI Assessment: Estimated body mass index is 24.42 kg/m as calculated from the following:   Height as of this encounter: 5' 5.5" (1.664 m).   Weight as of this  encounter: 149 lb (67.6 kg). Psych/Mental status: Alert, oriented x 3 (person, place, & time)  Eyes: PERLA Respiratory: No evidence of acute respiratory distress   Lumbar Spine Area Exam  Skin & Axial Inspection: No masses, redness, or swelling Alignment: Symmetrical Functional ROM: Unrestricted ROM       Stability: No instability detected Muscle Tone/Strength: Functionally intact. No obvious neuro-muscular anomalies detected. Sensory (Neurological): Unimpaired Palpation: Non-tender       Provocative Tests: Lumbar Hyperextension and rotation test: evaluation deferred today       Lumbar Lateral bending test: evaluation deferred today       Patrick's Maneuver: evaluation deferred today                    Gait & Posture Assessment  Ambulation: Patient ambulates using a wheel chair Gait: Relatively normal for age and body habitus Posture: WNL   Lower Extremity Exam    Side: Right lower extremity  Side: Left lower extremity  Stability: No instability observed          Stability: No instability observed          Skin & Extremity Inspection: Skin color, temperature, and hair growth are WNL. No peripheral edema or cyanosis. No masses, redness, swelling, asymmetry, or associated skin lesions. No contractures.  Skin & Extremity Inspection: Skin color, temperature, and hair growth are WNL. No peripheral edema or cyanosis. No masses, redness, swelling, asymmetry, or associated skin lesions. No contractures.  Functional ROM: Unrestricted ROM                  Functional ROM: Unrestricted ROM                  Muscle Tone/Strength: Functionally intact. No obvious neuro-muscular anomalies detected.  Muscle Tone/Strength: Functionally intact. No obvious neuro-muscular anomalies detected.  Sensory (Neurological): Unimpaired  Sensory (Neurological): Unimpaired  Palpation: No palpable anomalies  Palpation: No palpable anomalies   Assessment  Primary Diagnosis & Pertinent Problem List: The  primary encounter diagnosis was Lumbar Spondylosis. Diagnoses of Lumbar facet syndrome (Bilateral) (L>R), Chronic musculoskeletal pain, and Chronic pain syndrome were also pertinent to this visit.  Status Diagnosis  Controlled Controlled Controlled 1. Lumbar Spondylosis   2. Lumbar facet syndrome (Bilateral) (L>R)   3. Chronic musculoskeletal pain   4. Chronic pain syndrome     Problems updated and reviewed during this visit: No problems updated. Plan of Care  Pharmacotherapy (Medications Ordered): No orders of the defined types were placed in this encounter.  New Prescriptions   No medications on file   Medications administered today: Denise Barry. [place] had no medications administered during this visit. Lab-work, procedure(s), and/or referral(s): No orders of the defined types were placed in this encounter.  Imaging and/or referral(s): None  Interventional therapies: Planned, scheduled, and/or pending:   Not at this time. She will continue the Tramadol and follow up as scheduled in 3 weeks.    Considering: Diagnostic bilateral lumbar facet block Possible bilateral lumbar facet RFA Diagnostic bilateral sacroiliac joint block Possible bilateral sacroiliac joint RFA Diagnostic caudal epidural steroid injection plus diagnostic epidurogram Possible Racz procedure Diagnostic left sided L2-3 interlaminar lumbar epidural steroid injection   PRN Procedures: None at this time     Provider-requested follow-up: Return for Appointment As Scheduled.  Future Appointments  Date Time Provider [place]  05/02/2018 10:30 AM [facility]  06/11/2018 10:30 AM Einar Pheasant, MD [facility]  11/06/2018  1:00 PM O'Brien-Blaney, Bryson Corona, LPN [facility]   Primary Care Physician: Einar Pheasant, MD Location: [facility]  Facility Note by: Vevelyn Francois NP Date: 03/06/2018; Time: 11:44 AM  Pain Score Disclaimer: We use the  NRS-11 scale. This is a self-reported, subjective measurement of pain severity with only modest accuracy. It is used primarily to identify changes within a particular patient. It must be understood that outpatient pain scales are significantly less accurate that those used for research, where they can be applied under ideal controlled circumstances with minimal exposure to variables. In reality, the score is likely to be a combination of pain intensity and pain affect, where pain affect describes the degree of emotional arousal or changes in action readiness caused by the sensory experience of pain. Factors such as social and work situation, setting, emotional state, anxiety levels, expectation, and prior pain experience may influence pain perception and show large inter-individual differences that may also be affected by time variables.  Patient instructions provided during this appointment: Patient Instructions  ____________________________________________________________________________________________  Medication Rules  Applies to: All patients receiving prescriptions (written or electronic).  Pharmacy of record: Pharmacy where electronic prescriptions will be sent. If written prescriptions are taken to a different pharmacy, please inform the nursing staff. The pharmacy listed in the electronic medical record should be the one where you would like electronic prescriptions to be sent.  Prescription refills: Only during scheduled appointments. Applies to both, written and electronic prescriptions.  NOTE: The following applies primarily to controlled substances (Opioid* Pain Medications).   Patient's responsibilities: 1. Pain Pills: Bring all pain pills to every appointment (except for procedure appointments). 2. Pill Bottles: Bring pills in original pharmacy bottle. Always bring newest bottle. Bring bottle, even if empty. 3. Medication refills: You are responsible for knowing and keeping track  of what medications you need refilled. The day before your appointment, write a list of all prescriptions that need to be refilled. Bring that list to your appointment and give it to the admitting nurse. Prescriptions will be written only during appointments. If you forget a medication, it will not be "Called in", "Faxed", or "electronically sent". You will need to get another appointment to get these prescribed. 4. Prescription Accuracy: You are responsible for carefully inspecting your prescriptions before leaving our office. Have the discharge nurse carefully go over each prescription with you, before taking them home. Make sure that your name is accurately spelled, that your address is correct. Check the name and dose of your medication to make sure it is accurate. Check the number of pills, and the written instructions to make sure they are clear and accurate. Make sure that you are given enough medication to last until your next medication refill appointment. 5. Taking Medication: Take medication as prescribed. Never take more pills than instructed. Never take medication more frequently than prescribed. Taking less pills or less frequently is permitted and encouraged, when it comes to controlled substances (written prescriptions).  6. Inform other Doctors: Always inform, all of your healthcare providers, of all the medications you take. 7. Pain Medication from other Providers: You are not allowed to accept any additional pain medication from any other Doctor or Healthcare provider. There are two exceptions to this rule. (see below) In the event that you require additional pain medication, you are responsible for notifying us, as stated below. 8. Medication Agreement: You are responsible for carefully reading and following our Medication Agreement. This must be signed before receiving any prescriptions from our practice. Safely store a copy of your signed Agreement. Violations to the Agreement will result  in no further prescriptions. (  Additional copies of our Medication Agreement are available upon request.) 9. Laws, Rules, & Regulations: All patients are expected to follow all Federal and [employer], [employer], Rules, [employer]. Ignorance of the Laws does not constitute a valid excuse. The use of any illegal substances is prohibited. 10. Adopted CDC guidelines & recommendations: Target dosing levels will be at or below 60 MME/day. Use of benzodiazepines** is not recommended.  Exceptions: There are only two exceptions to the rule of not receiving pain medications from other Healthcare Providers. 1. Exception #1 (Emergencies): In the event of an emergency (i.e.: accident requiring emergency care), you are allowed to receive additional pain medication. However, you are responsible for: As soon as you are able, call our office [phone], at any time of the day or night, and leave a message stating your name, the date and nature of the emergency, and the name and dose of the medication prescribed. In the event that your call is answered by a member of our staff, make sure to document and save the date, time, and the name of the person that took your information.  2. Exception #2 (Planned Surgery): In the event that you are scheduled by another doctor or dentist to have any type of surgery or procedure, you are allowed (for a period no longer than 30 days), to receive additional pain medication, for the acute post-op pain. However, in this case, you are responsible for picking up a copy of our "Post-op Pain Management for Surgeons" handout, and giving it to your surgeon or dentist. This document is available at our office, and does not require an appointment to obtain it. Simply go to our office during business hours (Monday-Thursday from 8:00 AM to 4:00 PM) (Friday 8:00 AM to 12:00 Noon) or if you have a scheduled appointment with [place], prior to your surgery, and ask for it by name. In addition, you will need  to provide [place] with your name, name of your surgeon, type of surgery, and date of procedure or surgery.  *Opioid medications include: morphine, codeine, oxycodone, oxymorphone, hydrocodone, hydromorphone, meperidine, tramadol, tapentadol, buprenorphine, fentanyl, methadone. **Benzodiazepine medications include: diazepam (Valium), alprazolam (Xanax), clonazepam (Klonopine), lorazepam (Ativan), clorazepate (Tranxene), chlordiazepoxide (Librium), estazolam (Prosom), oxazepam (Serax), temazepam (Restoril), triazolam (Halcion) (Last updated: 12/28/2017) ____________________________________________________________________________________________   ____________________________________________________________________________________________  Appointment Policy Summary  It is our goal and responsibility to provide the medical community with assistance in the evaluation and management of patients with chronic pain. Unfortunately our resources are limited. Because we do not have an unlimited amount of time, or available appointments, we are required to closely monitor and manage their use. The following rules exist to maximize their use:  Patient's responsibilities: 1. Punctuality:  At what time should I arrive? You should be physically present in our office 30 minutes before your scheduled appointment. Your scheduled appointment is with your assigned healthcare provider. However, it takes 5-10 minutes to be "checked-in", and another 15 minutes for the nurses to do the admission. If you arrive to our office at the time you were given for your appointment, you will end up being at least 20-25 minutes late to your appointment with the provider. 2. Tardiness:  What happens if I arrive only a few minutes after my scheduled appointment time? You will need to reschedule your appointment. The cutoff is your appointment time. This is why it is so important that you arrive at least 30 minutes before that appointment.  If you have an appointment scheduled for 10:00 AM  and you arrive at 10:01, you will be required to reschedule your appointment.  3. Plan ahead:  Always assume that you will encounter traffic on your way in. Plan for it. If you are dependent on a driver, make sure they understand these rules and the need to arrive early. 4. Other appointments and responsibilities:  Avoid scheduling any other appointments before or after your pain clinic appointments.  5. Be prepared:  Write down everything that you need to discuss with your healthcare provider and give this information to the admitting nurse. Write down the medications that you will need refilled. Bring your pills and bottles (even the empty ones), to all of your appointments, except for those where a procedure is scheduled. 6. No children or pets:  Find someone to take care of them. It is not appropriate to bring them in. 7. Scheduling changes:  We request "advanced notification" of any changes or cancellations. 8. Advanced notification:  Defined as a time period of more than 24 hours prior to the originally scheduled appointment. This allows for the appointment to be offered to other patients. 9. Rescheduling:  When a visit is rescheduled, it will require the cancellation of the original appointment. For this reason they both fall within the category of "Cancellations".  10. Cancellations:  They require advanced notification. Any cancellation less than 24 hours before the  appointment will be recorded as a "No Show". 11. No Show:  Defined as an unkept appointment where the patient failed to notify or declare to the practice their intention or inability to keep the appointment.  Corrective process for repeat offenders:  1. Tardiness: Three (3) episodes of rescheduling due to late arrivals will be recorded as one (1) "No Show". 2. Cancellation or reschedule: Three (3) cancellations or rescheduling will be recorded as one (1) "No Show". 3. "No  Shows": Three (3) "No Shows" within a 12 month period will result in discharge from the practice. ____________________________________________________________________________________________

## 2018-03-12 DIAGNOSIS — D0371 Melanoma in situ of right lower limb, including hip: Secondary | ICD-10-CM | POA: Diagnosis not present

## 2018-03-29 ENCOUNTER — Ambulatory Visit: Payer: Medicare Other | Admitting: Nurse Practitioner

## 2018-03-29 ENCOUNTER — Other Ambulatory Visit (INDEPENDENT_AMBULATORY_CARE_PROVIDER_SITE_OTHER): Payer: Medicare Other

## 2018-03-29 DIAGNOSIS — I10 Essential (primary) hypertension: Secondary | ICD-10-CM | POA: Diagnosis not present

## 2018-03-29 DIAGNOSIS — D649 Anemia, unspecified: Secondary | ICD-10-CM

## 2018-03-29 LAB — CBC WITH DIFFERENTIAL/PLATELET
BASOS ABS: 0 10*3/uL (ref 0.0–0.1)
Basophils Relative: 0.8 % (ref 0.0–3.0)
EOS ABS: 0.1 10*3/uL (ref 0.0–0.7)
Eosinophils Relative: 1.3 % (ref 0.0–5.0)
HEMATOCRIT: 33.3 % — AB (ref 36.0–46.0)
Hemoglobin: 10.2 g/dL — ABNORMAL LOW (ref 12.0–15.0)
LYMPHS PCT: 17.8 % (ref 12.0–46.0)
Lymphs Abs: 0.9 10*3/uL (ref 0.7–4.0)
MCHC: 30.5 g/dL (ref 30.0–36.0)
MCV: 74.4 fl — AB (ref 78.0–100.0)
MONOS PCT: 14.8 % — AB (ref 3.0–12.0)
Monocytes Absolute: 0.8 10*3/uL (ref 0.1–1.0)
NEUTROS PCT: 65.3 % (ref 43.0–77.0)
Neutro Abs: 3.4 10*3/uL (ref 1.4–7.7)
PLATELETS: 499 10*3/uL — AB (ref 150.0–400.0)
RBC: 4.47 Mil/uL (ref 3.87–5.11)
RDW: 22.8 % — ABNORMAL HIGH (ref 11.5–15.5)
WBC: 5.2 10*3/uL (ref 4.0–10.5)

## 2018-03-29 LAB — BASIC METABOLIC PANEL
BUN: 13 mg/dL (ref 6–23)
CALCIUM: 9.6 mg/dL (ref 8.4–10.5)
CO2: 29 mEq/L (ref 19–32)
Chloride: 97 mEq/L (ref 96–112)
Creatinine, Ser: 0.79 mg/dL (ref 0.40–1.20)
GFR: 74.09 mL/min (ref >60.00)
Glucose, Bld: 97 mg/dL (ref 70–99)
POTASSIUM: 4.6 meq/L (ref 3.5–5.1)
Sodium: 134 mEq/L — ABNORMAL LOW (ref 135–145)

## 2018-03-29 LAB — FERRITIN: FERRITIN: 29.7 ng/mL (ref 10.0–291.0)

## 2018-03-30 ENCOUNTER — Other Ambulatory Visit: Payer: Self-pay | Admitting: Internal Medicine

## 2018-03-30 DIAGNOSIS — E871 Hypo-osmolality and hyponatremia: Secondary | ICD-10-CM

## 2018-03-30 DIAGNOSIS — D649 Anemia, unspecified: Secondary | ICD-10-CM

## 2018-03-30 NOTE — Progress Notes (Signed)
Order placed for f/u labs.  

## 2018-04-02 ENCOUNTER — Ambulatory Visit: Payer: Medicare Other | Attending: Nurse Practitioner | Admitting: Nurse Practitioner

## 2018-04-02 ENCOUNTER — Other Ambulatory Visit: Payer: Self-pay

## 2018-04-02 ENCOUNTER — Encounter: Payer: Self-pay | Admitting: Nurse Practitioner

## 2018-04-02 VITALS — BP 154/84 | HR 81 | Temp 98.3°F | Resp 16 | Ht 65.5 in | Wt 150.0 lb

## 2018-04-02 DIAGNOSIS — M545 Low back pain: Secondary | ICD-10-CM | POA: Diagnosis present

## 2018-04-02 DIAGNOSIS — M4310 Spondylolisthesis, site unspecified: Secondary | ICD-10-CM | POA: Insufficient documentation

## 2018-04-02 DIAGNOSIS — M533 Sacrococcygeal disorders, not elsewhere classified: Secondary | ICD-10-CM | POA: Diagnosis not present

## 2018-04-02 DIAGNOSIS — E871 Hypo-osmolality and hyponatremia: Secondary | ICD-10-CM | POA: Diagnosis not present

## 2018-04-02 DIAGNOSIS — M48061 Spinal stenosis, lumbar region without neurogenic claudication: Secondary | ICD-10-CM | POA: Diagnosis not present

## 2018-04-02 DIAGNOSIS — M47817 Spondylosis without myelopathy or radiculopathy, lumbosacral region: Secondary | ICD-10-CM | POA: Diagnosis not present

## 2018-04-02 DIAGNOSIS — I10 Essential (primary) hypertension: Secondary | ICD-10-CM | POA: Insufficient documentation

## 2018-04-02 DIAGNOSIS — M5136 Other intervertebral disc degeneration, lumbar region: Secondary | ICD-10-CM | POA: Insufficient documentation

## 2018-04-02 DIAGNOSIS — D649 Anemia, unspecified: Secondary | ICD-10-CM | POA: Diagnosis not present

## 2018-04-02 DIAGNOSIS — Z7982 Long term (current) use of aspirin: Secondary | ICD-10-CM | POA: Insufficient documentation

## 2018-04-02 DIAGNOSIS — Z87891 Personal history of nicotine dependence: Secondary | ICD-10-CM | POA: Diagnosis not present

## 2018-04-02 DIAGNOSIS — E7800 Pure hypercholesterolemia, unspecified: Secondary | ICD-10-CM | POA: Diagnosis not present

## 2018-04-02 DIAGNOSIS — R32 Unspecified urinary incontinence: Secondary | ICD-10-CM | POA: Diagnosis not present

## 2018-04-02 DIAGNOSIS — Z79891 Long term (current) use of opiate analgesic: Secondary | ICD-10-CM | POA: Insufficient documentation

## 2018-04-02 DIAGNOSIS — K5732 Diverticulitis of large intestine without perforation or abscess without bleeding: Secondary | ICD-10-CM | POA: Diagnosis not present

## 2018-04-02 DIAGNOSIS — Z79899 Other long term (current) drug therapy: Secondary | ICD-10-CM | POA: Insufficient documentation

## 2018-04-02 DIAGNOSIS — G500 Trigeminal neuralgia: Secondary | ICD-10-CM | POA: Diagnosis not present

## 2018-04-02 DIAGNOSIS — G894 Chronic pain syndrome: Secondary | ICD-10-CM | POA: Diagnosis not present

## 2018-04-02 DIAGNOSIS — M47816 Spondylosis without myelopathy or radiculopathy, lumbar region: Secondary | ICD-10-CM | POA: Insufficient documentation

## 2018-04-02 MED ORDER — TRAMADOL HCL 50 MG PO TABS: 50.0000 mg | ORAL_TABLET | Freq: Four times a day (QID) | ORAL | 1 refills | Status: DC | PRN

## 2018-04-02 NOTE — Progress Notes (Signed)
Nursing Pain Medication Assessment:  Safety precautions to be maintained throughout the outpatient stay will include: orient to surroundings, keep bed in low position, maintain call bell within reach at all times, provide assistance with transfer out of bed and ambulation.  Medication Inspection Compliance: Denise Barry did not comply with our request to bring her pills to be counted. She was reminded that bringing the medication bottles, even when empty, is a requirement.  Medication: None brought in. Pill/Patch Count: None available to be counted. Bottle Appearance: No container available. Did not bring bottle(s) to appointment. Filled Date: N/A Last Medication intake:  Today

## 2018-04-02 NOTE — Patient Instructions (Signed)
____________________________________________________________________________________________  Medication Rules  Applies to: All patients receiving prescriptions (written or electronic).  Pharmacy of record: Pharmacy where electronic prescriptions will be sent. If written prescriptions are taken to a different pharmacy, please inform the nursing staff. The pharmacy listed in the electronic medical record should be the one where you would like electronic prescriptions to be sent.  Prescription refills: Only during scheduled appointments. Applies to both, written and electronic prescriptions.  NOTE: The following applies primarily to controlled substances (Opioid* Pain Medications).   Patient's responsibilities: 1. Pain Pills: Bring all pain pills to every appointment (except for procedure appointments). 2. Pill Bottles: Bring pills in original pharmacy bottle. Always bring newest bottle. Bring bottle, even if empty. 3. Medication refills: You are responsible for knowing and keeping track of what medications you need refilled. The day before your appointment, write a list of all prescriptions that need to be refilled. Bring that list to your appointment and give it to the admitting nurse. Prescriptions will be written only during appointments. If you forget a medication, it will not be "Called in", "Faxed", or "electronically sent". You will need to get another appointment to get these prescribed. 4. Prescription Accuracy: You are responsible for carefully inspecting your prescriptions before leaving our office. Have the discharge nurse carefully go over each prescription with you, before taking them home. Make sure that your name is accurately spelled, that your address is correct. Check the name and dose of your medication to make sure it is accurate. Check the number of pills, and the written instructions to make sure they are clear and accurate. Make sure that you are given enough medication to last  until your next medication refill appointment. 5. Taking Medication: Take medication as prescribed. Never take more pills than instructed. Never take medication more frequently than prescribed. Taking less pills or less frequently is permitted and encouraged, when it comes to controlled substances (written prescriptions).  6. Inform other Doctors: Always inform, all of your healthcare providers, of all the medications you take. 7. Pain Medication from other Providers: You are not allowed to accept any additional pain medication from any other Doctor or Healthcare provider. There are two exceptions to this rule. (see below) In the event that you require additional pain medication, you are responsible for notifying us, as stated below. 8. Medication Agreement: You are responsible for carefully reading and following our Medication Agreement. This must be signed before receiving any prescriptions from our practice. Safely store a copy of your signed Agreement. Violations to the Agreement will result in no further prescriptions. (Additional copies of our Medication Agreement are available upon request.) 9. Laws, Rules, & Regulations: All patients are expected to follow all Federal and State Laws, Statutes, Rules, & Regulations. Ignorance of the Laws does not constitute a valid excuse. The use of any illegal substances is prohibited. 10. Adopted CDC guidelines & recommendations: Target dosing levels will be at or below 60 MME/day. Use of benzodiazepines** is not recommended.  Exceptions: There are only two exceptions to the rule of not receiving pain medications from other Healthcare Providers. 1. Exception #1 (Emergencies): In the event of an emergency (i.e.: accident requiring emergency care), you are allowed to receive additional pain medication. However, you are responsible for: As soon as you are able, call our office [phone], at any time of the day or night, and leave a message stating your name, the  date and nature of the emergency, and the name and dose of the medication   prescribed. In the event that your call is answered by a member of our staff, make sure to document and save the date, time, and the name of the person that took your information.  2. Exception #2 (Planned Surgery): In the event that you are scheduled by another doctor or dentist to have any type of surgery or procedure, you are allowed (for a period no longer than 30 days), to receive additional pain medication, for the acute post-op pain. However, in this case, you are responsible for picking up a copy of our "Post-op Pain Management for Surgeons" handout, and giving it to your surgeon or dentist. This document is available at our office, and does not require an appointment to obtain it. Simply go to our office during business hours (Monday-Thursday from 8:00 AM to 4:00 PM) (Friday 8:00 AM to 12:00 Noon) or if you have a scheduled appointment with us, prior to your surgery, and ask for it by name. In addition, you will need to provide us with your name, name of your surgeon, type of surgery, and date of procedure or surgery.  *Opioid medications include: morphine, codeine, oxycodone, oxymorphone, hydrocodone, hydromorphone, meperidine, tramadol, tapentadol, buprenorphine, fentanyl, methadone. **Benzodiazepine medications include: diazepam (Valium), alprazolam (Xanax), clonazepam (Klonopine), lorazepam (Ativan), clorazepate (Tranxene), chlordiazepoxide (Librium), estazolam (Prosom), oxazepam (Serax), temazepam (Restoril), triazolam (Halcion) (Last updated: 12/28/2017) ____________________________________________________________________________________________    

## 2018-04-02 NOTE — Progress Notes (Signed)
Patient's Name: Denise Barry  MRN: [id number]  Referring Provider: Einar Pheasant, MD  DOB: December 31, 1933  PCP: Einar Pheasant, MD  DOS: 04/02/2018  Note by: Vevelyn Francois NP  Service setting: Ambulatory outpatient  Specialty: Interventional Pain Management  Location: [facility] (AMB) Pain Management Facility    Patient type: Established    Primary Reason(s) for Visit: Encounter for prescription drug management & post-procedure evaluation of chronic illness with mild to moderate exacerbation(Level of risk: moderate) CC: Back Pain (lower)  HPI  Denise Barry is a 84 y.o. year old, female patient, who comes today for a post-procedure evaluation and medication management. 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); Urinary incontinence; History of hip fracture (Left); Right leg swelling; Health care maintenance; Hyponatremia; History of shoulder fracture (Left); Syncope; Chronic low back pain (Primary Area of Pain)  (Bilateral) (L>R); Chronic pain syndrome; Opiate use; Other long term (current) drug therapy; Other specified health status; Other reduced mobility; Disorder of bone, unspecified; Acquired spondylolisthesis; Closed 2-part displaced fracture of surgical neck of left humerus; DDD (degenerative disc disease), lumbar; Spinal stenosis, unspecified region other than cervical; SUNCT (short unilateral neuralgiform headache, conjunctival inj/tear); Anemia; Old Compression fracture of L1 and L2; DDD (degenerative disc disease), thoracic; Scoliosis; Abnormal MRI, lumbar spine (07/25/2017); Chronic musculoskeletal pain; Lumbar Spondylosis; Lumbar facet syndrome (Bilateral) (L>R); Chronic sacroiliac joint pain (Bilateral) (L>R); Abdominal pain; Elevated WBC count; Granulocytosis; Diverticulitis of large intestine with perforation; Diverticulitis; Difficulty sleeping; Spondylosis without myelopathy or  radiculopathy, lumbosacral region; and Other specified dorsopathies, sacral and sacrococcygeal region on their problem list. Her primarily concern today is the Back Pain (lower)  Pain Assessment: Location: Lower Back Radiating: denies Onset: More than a month ago Duration: Chronic pain Quality: Spasm Severity: 0-No pain/10 (subjective, self-reported pain score)  Note: Reported level is compatible with observation.                          Effect on ADL: frequent rest periods are required Timing: Intermittent Modifying factors: heating pad, magnesium, medications, procedures BP: (!) 154/84  HR: 81  Ms. Adee was last seen on 03/06/2018 for a procedure. During today's appointment we reviewed Denise Barry's post-procedure results, as well as her outpatient medication regimen. She admits that the last week has been good. She admits that she is not having any spasms. She   Further details on both, my assessment(s), as well as the proposed treatment plan, please see below.  Controlled Substance Pharmacotherapy Assessment REMS (Risk Evaluation and Mitigation Strategy)  Analgesic: Tramadol 50 mg QID MME/day: '20mg'$ /day.  Rise Patience, RN  04/02/2018 11:20 AM  Sign at close encounter Nursing Pain Medication Assessment:  Safety precautions to be maintained throughout the outpatient stay will include: orient to surroundings, keep bed in low position, maintain call bell within reach at all times, provide assistance with transfer out of bed and ambulation.  Medication Inspection Compliance: [name] did not comply with our request to bring her pills to be counted. She was reminded that bringing the medication bottles, even when empty, is a requirement.  Medication: None brought in. Pill/Patch Count: None available to be counted. Bottle Appearance: No container available. Did not bring bottle(s) to appointment. Filled Date: N/A Last Medication intake:  Today   Pharmacokinetics: Liberation  and absorption (onset of action): WNL Distribution (time to peak effect): WNL Metabolism and excretion (duration of action): WNL  Pharmacodynamics: Desired effects: Analgesia: [name] reports >50% benefit. Functional ability: Patient reports that medication allows her to accomplish basic ADLs Clinically meaningful improvement in function (CMIF): Sustained CMIF goals met Perceived effectiveness: Described as relatively effective, allowing for increase in activities of daily living (ADL) Undesirable effects: Side-effects or Adverse reactions: None reported Monitoring: [place] PMP: Online review of the past 28-monthperiod conducted. Compliant with practice rules and regulations Last UDS on record: No results found for: SUMMARY UDS interpretation: Compliant          Medication Assessment Form: Reviewed. Patient indicates being compliant with therapy Treatment compliance: Compliant Risk Assessment Profile: Aberrant behavior: See prior evaluations. None observed or detected today Comorbid factors increasing risk of overdose: See prior notes. No additional risks detected today Risk of substance use disorder (SUD): Low Opioid Risk Tool - 82/03/19 1116      Family History of Substance Abuse   Alcohol  Negative    Illegal Drugs  Negative    Rx Drugs  Negative      Personal History of Substance Abuse   Alcohol  Negative    Illegal Drugs  Negative    Rx Drugs  Negative      Psychological Disease   Psychological Disease  Negative    Depression  Negative      Total Score   Opioid Risk Tool Scoring  0    Opioid Risk Interpretation  Low Risk      ORT Scoring interpretation table:  Score <3 = Low Risk for SUD  Score between 4-7 = Moderate Risk for SUD  Score >8 = High Risk for Opioid Abuse   Risk Mitigation Strategies:  Patient Counseling: Covered Patient-Prescriber Agreement (PPA): Present and active  Notification to other healthcare providers: Done  Pharmacologic Plan:  No change in therapy, at this time.             Post-Procedure Assessment  02/15/2018 Procedure: Right Lumbar RFA Pre-procedure pain score:  4/10 Post-procedure pain score: 0/10         Influential Factors: BMI: 24.58 kg/m Intra-procedural challenges: None observed.         Assessment challenges: None detected.              Reported side-effects: None.        Post-procedural adverse reactions or complications: None reported         Sedation: Please see nurses note. When no sedatives are used, the analgesic levels obtained are directly associated to the effectiveness of the local anesthetics. However, when sedation is provided, the level of analgesia obtained during the initial 1 hour following the intervention, is believed to be the result of a combination of factors. These factors may include, but are not limited to: 1. The effectiveness of the local anesthetics used. 2. The effects of the analgesic(s) and/or anxiolytic(s) used. 3. The degree of discomfort experienced by the patient at the time of the procedure. 4. The patients ability and reliability in recalling and recording the events. 5. The presence and influence of possible secondary gains and/or psychosocial factors. Reported result: Relief experienced during the 1st hour after the procedure:  100% (Ultra-Short Term Relief)            Interpretative annotation: Clinically appropriate result. Analgesia during this period is likely to be Local Anesthetic and/or IV Sedative (Analgesic/Anxiolytic) related.          Effects of local anesthetic: The analgesic effects attained during this period are directly associated to the  localized infiltration of local anesthetics and therefore cary significant diagnostic value as to the etiological location, or anatomical origin, of the pain. Expected duration of relief is directly dependent on the pharmacodynamics of the local anesthetic used. Long-acting (4-6 hours) anesthetics used.  Reported  result: Relief during the next 4 to 6 hour after the procedure:  100% (Short-Term Relief)            Interpretative annotation: Clinically appropriate result. Analgesia during this period is likely to be Local Anesthetic-related.          Long-term benefit: Defined as the period of time past the expected duration of local anesthetics (1 hour for short-acting and 4-6 hours for long-acting). With the possible exception of prolonged sympathetic blockade from the local anesthetics, benefits during this period are typically attributed to, or associated with, other factors such as analgesic sensory neuropraxia, antiinflammatory effects, or beneficial biochemical changes provided by agents other than the local anesthetics.  Reported result: Extended relief following procedure:  <50% (Long-Term Relief)            Interpretative annotation: Clinically appropriate result. Good relief. No permanent benefit expected. Inflammation plays a part in the etiology to the pain.          Current benefits: Defined as reported results that persistent at this point in time.   Analgesia: 75-100 %            Function: Ms. Schouten reports improvement in function ROM: Ms. Boultinghouse reports improvement in ROM Interpretative annotation: Ongoing benefit. Therapeutic success. Adequate RF ablation.          Interpretation: Results would suggest adequate radiofrequency ablation.                  Plan:  Please see "Plan of Care" for details.                Laboratory Chemistry  Inflammation Markers (CRP: Acute Phase) (ESR: Chronic Phase) Lab Results  Component Value Date   CRP 1.1 08/28/2017   ESRSEDRATE 5 08/28/2017   LATICACIDVEN 1.3 02/04/2015                         Rheumatology Markers No results found for: RF, ANA, LABURIC, URICUR, LYMEIGGIGMAB, LYMEABIGMQN, HLAB27                      Renal Function Markers Lab Results  Component Value Date   BUN 13 03/29/2018   CREATININE 0.79 03/29/2018   BCR 17 08/28/2017    GFRAA >60 10/06/2017   GFRNONAA 59 (L) 10/06/2017                              Hepatic Function Markers Lab Results  Component Value Date   AST 13 11/22/2017   ALT 15 11/22/2017   ALBUMIN 4.0 11/22/2017   ALKPHOS 61 11/22/2017   LIPASE 80 (H) 02/03/2015                        Electrolytes Lab Results  Component Value Date   NA 134 (L) 03/29/2018   K 4.6 03/29/2018   CL 97 03/29/2018   CALCIUM 9.6 03/29/2018   MG 2.2 10/06/2017   PHOS 4.1 10/06/2017                        Neuropathy  Markers No results found for: VITAMINB12, FOLATE, HGBA1C, HIV                      Bone Pathology Markers Lab Results  Component Value Date   25OHVITD1 47 08/28/2017   25OHVITD2 <1.0 08/28/2017   25OHVITD3 47 08/28/2017                         Coagulation Parameters Lab Results  Component Value Date   INR 1.0 02/03/2015   LABPROT 13.3 02/03/2015   APTT 26.6 01/12/2014   PLT 499.0 (H) 03/29/2018                        Cardiovascular Markers Lab Results  Component Value Date   TROPONINI <0.03 02/03/2015   HGB 10.2 (L) 03/29/2018   HCT 33.3 (L) 03/29/2018                         CA Markers No results found for: CEA, CA125, LABCA2                      Note: Lab results reviewed.  Recent Diagnostic Imaging Results  DG C-Arm 1-60 Min-No Report Fluoroscopy was utilized by the requesting physician.  No radiographic  interpretation.   Complexity Note: Imaging results reviewed. Results shared with Ms. Meeuwsen, using [employer].                         Meds   Current Outpatient Medications:  .  Acetaminophen (TYLENOL EXTRA STRENGTH PO), Take 1-2 tablets by mouth as needed. , Disp: , Rfl:  .  amLODipine (NORVASC) 10 MG tablet, Take 1 tablet (10 mg total) by mouth daily., Disp: 30 tablet, Rfl: 3 .  aspirin 81 MG tablet, Take 81 mg by mouth daily., Disp: , Rfl:  .  Eslicarbazepine Acetate 400 MG TABS, Take 1 tablet by mouth 3 (three) times daily., Disp: , Rfl:  .   estradiol (ESTRACE VAGINAL) 0.1 MG/GM vaginal cream, Apply PRN, Disp: , Rfl:  .  Fe Fum-FePoly-Vit C-Vit B3 (INTEGRA) 62.5-62.5-40-3 MG CAPS, Take one tablet daily, Disp: 30 capsule, Rfl: 2 .  fexofenadine (ALLEGRA) 180 MG tablet, Take 180 mg by mouth as needed for allergies or rhinitis., Disp: , Rfl:  .  Multiple Vitamin (MULTI-VITAMINS) TABS, Take 1 tablet by mouth. , Disp: , Rfl:  .  [START ON 04/16/2018] traMADol (ULTRAM) 50 MG tablet, Take 1 tablet (50 mg total) by mouth every 6 (six) hours as needed for severe pain., Disp: 120 tablet, Rfl: 1  ROS  Constitutional: Denies any fever or chills Gastrointestinal: No reported hemesis, hematochezia, vomiting, or acute GI distress Musculoskeletal: Denies any acute onset joint swelling, redness, loss of ROM, or weakness Neurological: No reported episodes of acute onset apraxia, aphasia, dysarthria, agnosia, amnesia, paralysis, loss of coordination, or loss of consciousness  Allergies  Ms. Trotter is allergic to codeine; cyclobenzaprine; methocarbamol; carbamazepine; and gabapentin.  [place]  Drug: Ms. Delone  reports that she does not use drugs. Alcohol:  reports that she does not drink alcohol. Tobacco:  reports that she has quit smoking. She has never used smokeless tobacco. Medical:  has a past medical history of Allergy, Diverticulitis, Hypercholesterolemia, Hypertension, Osteoarthritis, Urinary incontinence, and Varicose veins. Surgical: Ms. Birkland  has a past surgical history that includes Back surgery (09/16/13); sclerosis (2000);  Dilation and curettage of uterus (1971); Tubal ligation (1972); and Joint replacement. Family: family history includes COPD in her son; Cancer in her son; Diabetes in her mother; Heart disease in her father and mother; Hypertension in her mother; Stroke in her father.  Constitutional Exam  General appearance: Well nourished, well developed, and well hydrated. In no apparent acute distress Vitals:   04/02/18  1106  BP: (!) 154/84  Pulse: 81  Resp: 16  Temp: 98.3 F (36.8 C)  TempSrc: Oral  SpO2: 100%  Weight: 150 lb (68 kg)  Height: 5' 5.5" (1.664 m)   BMI Assessment: Estimated body mass index is 24.58 kg/m as calculated from the following:   Height as of this encounter: 5' 5.5" (1.664 m).   Weight as of this encounter: 150 lb (68 kg). Psych/Mental status: Alert, oriented x 3 (person, place, & time)       Eyes: PERLA Respiratory: No evidence of acute respiratory distress  Lumbar Spine Area Exam  Skin & Axial Inspection: Well healed scar from previous spine surgery detected Alignment: Symmetrical Functional ROM: Unrestricted ROM       Stability: No instability detected Muscle Tone/Strength: Functionally intact. No obvious neuro-muscular anomalies detected. Sensory (Neurological): Unimpaired Palpation: No palpable anomalies       Provocative Tests: Lumbar Hyperextension/rotation test: deferred today       Lumbar quadrant test (Kemp's test): deferred today       Lumbar Lateral bending test: deferred today       Patrick's Maneuver: deferred today                   FABER test: deferred today       Thigh-thrust test: deferred today       S-I compression test: deferred today       S-I distraction test: deferred today        Gait & Posture Assessment  Ambulation: Patient ambulates using a wheel chair Gait: Relatively normal for age and body habitus Posture: WNL   Lower Extremity Exam    Side: Right lower extremity  Side: Left lower extremity  Stability: No instability observed          Stability: No instability observed          Skin & Extremity Inspection: Skin color, temperature, and hair growth are WNL. No peripheral edema or cyanosis. No masses, redness, swelling, asymmetry, or associated skin lesions. No contractures.  Skin & Extremity Inspection: Skin color, temperature, and hair growth are WNL. No peripheral edema or cyanosis. No masses, redness, swelling, asymmetry, or  associated skin lesions. No contractures.  Functional ROM: Unrestricted ROM                  Functional ROM: Unrestricted ROM                  Muscle Tone/Strength: Functionally intact. No obvious neuro-muscular anomalies detected.  Muscle Tone/Strength: Functionally intact. No obvious neuro-muscular anomalies detected.  Sensory (Neurological): Unimpaired  Sensory (Neurological): Unimpaired  Palpation: No palpable anomalies  Palpation: No palpable anomalies   Assessment  Primary Diagnosis & Pertinent Problem List: The primary encounter diagnosis was Lumbar Spondylosis. Diagnoses of Lumbar facet syndrome (Bilateral) (L>R), DDD (degenerative disc disease), lumbar, and Chronic pain syndrome were also pertinent to this visit.  Status Diagnosis  Controlled Controlled Controlled 1. Lumbar Spondylosis   2. Lumbar facet syndrome (Bilateral) (L>R)   3. DDD (degenerative disc disease), lumbar   4. Chronic pain syndrome  Problems updated and reviewed during this visit: No problems updated. Plan of Care  Pharmacotherapy (Medications Ordered): Meds ordered this encounter  Medications  . traMADol (ULTRAM) 50 MG tablet    Sig: Take 1 tablet (50 mg total) by mouth every 6 (six) hours as needed for severe pain.    Dispense:  120 tablet    Refill:  1    Do not place this medication, or any other prescription from our practice, on "Automatic Refill". Patient may have prescription filled one day early if pharmacy is closed on scheduled refill date. Do not fill until: 04/16/2018 To last until: 06/15/2018    Order Specific Question:   Supervising Provider    Answer:   Milinda Pointer [phone]   New Prescriptions   No medications on file   Medications administered today: Onya Eutsler. Gruner had no medications administered during this visit. Lab-work, procedure(s), and/or referral(s): No orders of the defined types were placed in this encounter.  Imaging and/or  referral(s): None   Interventional therapies: Planned, scheduled, and/or pending:   Not at this time. She will continue the Tramadol and follow up as scheduled in 3 weeks.    Considering: Diagnostic bilateral lumbar facet block Possible bilateral lumbar facet RFA Diagnostic bilateral sacroiliac joint block Possible bilateral sacroiliac joint RFA Diagnostic caudal epidural steroid injection plus diagnostic epidurogram Possible Racz procedure Diagnostic left sided L2-3 interlaminar lumbar epidural steroid injection   PRN Procedures: None at this time      Provider-requested follow-up: Return in about 2 months (around 06/02/2018) for MedMgmt with Me Donella Stade Edison Pace).  Future Appointments  Date Time Provider [place]  05/02/2018 10:30 AM [facility]  05/31/2018 10:30 AM Vevelyn Francois, NP [facility] None  06/11/2018 10:30 AM Einar Pheasant, MD [facility]  11/06/2018  1:00 PM O'Brien-Blaney, Bryson Corona, LPN [facility]   Primary Care Physician: Einar Pheasant, MD Location: [facility] Note by: Vevelyn Francois NP Date: 04/02/2018; Time: 2:43 PM  Pain Score Disclaimer: We use the NRS-11 scale. This is a self-reported, subjective measurement of pain severity with only modest accuracy. It is used primarily to identify changes within a particular patient. It must be understood that outpatient pain scales are significantly less accurate that those used for research, where they can be applied under ideal controlled circumstances with minimal exposure to variables. In reality, the score is likely to be a combination of pain intensity and pain affect, where pain affect describes the degree of emotional arousal or changes in action readiness caused by the sensory experience of pain. Factors such as social and work situation, setting, emotional state, anxiety levels, expectation, and prior pain experience may influence pain perception  and show large inter-individual differences that may also be affected by time variables.  Patient instructions provided during this appointment: Patient Instructions  ____________________________________________________________________________________________  Medication Rules  Applies to: All patients receiving prescriptions (written or electronic).  Pharmacy of record: Pharmacy where electronic prescriptions will be sent. If written prescriptions are taken to a different pharmacy, please inform the nursing staff. The pharmacy listed in the electronic medical record should be the one where you would like electronic prescriptions to be sent.  Prescription refills: Only during scheduled appointments. Applies to both, written and electronic prescriptions.  NOTE: The following applies primarily to controlled substances (Opioid* Pain Medications).   Patient's responsibilities: 1. Pain Pills: Bring all pain pills to every appointment (except for procedure appointments). 2. Pill Bottles: Bring pills in original  pharmacy bottle. Always bring newest bottle. Bring bottle, even if empty. 3. Medication refills: You are responsible for knowing and keeping track of what medications you need refilled. The day before your appointment, write a list of all prescriptions that need to be refilled. Bring that list to your appointment and give it to the admitting nurse. Prescriptions will be written only during appointments. If you forget a medication, it will not be "Called in", "Faxed", or "electronically sent". You will need to get another appointment to get these prescribed. 4. Prescription Accuracy: You are responsible for carefully inspecting your prescriptions before leaving our office. Have the discharge nurse carefully go over each prescription with you, before taking them home. Make sure that your name is accurately spelled, that your address is correct. Check the name and dose of your medication to make  sure it is accurate. Check the number of pills, and the written instructions to make sure they are clear and accurate. Make sure that you are given enough medication to last until your next medication refill appointment. 5. Taking Medication: Take medication as prescribed. Never take more pills than instructed. Never take medication more frequently than prescribed. Taking less pills or less frequently is permitted and encouraged, when it comes to controlled substances (written prescriptions).  6. Inform other Doctors: Always inform, all of your healthcare providers, of all the medications you take. 7. Pain Medication from other Providers: You are not allowed to accept any additional pain medication from any other Doctor or Healthcare provider. There are two exceptions to this rule. (see below) In the event that you require additional pain medication, you are responsible for notifying us, as stated below. 8. Medication Agreement: You are responsible for carefully reading and following our Medication Agreement. This must be signed before receiving any prescriptions from our practice. Safely store a copy of your signed Agreement. Violations to the Agreement will result in no further prescriptions. (Additional copies of our Medication Agreement are available upon request.) 9. Laws, Rules, & Regulations: All patients are expected to follow all Federal and [employer], [employer], Rules, [employer]. Ignorance of the Laws does not constitute a valid excuse. The use of any illegal substances is prohibited. 10. Adopted CDC guidelines & recommendations: Target dosing levels will be at or below 60 MME/day. Use of benzodiazepines** is not recommended.  Exceptions: There are only two exceptions to the rule of not receiving pain medications from other Healthcare Providers. 1. Exception #1 (Emergencies): In the event of an emergency (i.e.: accident requiring emergency care), you are allowed to receive additional pain  medication. However, you are responsible for: As soon as you are able, call our office [phone], at any time of the day or night, and leave a message stating your name, the date and nature of the emergency, and the name and dose of the medication prescribed. In the event that your call is answered by a member of our staff, make sure to document and save the date, time, and the name of the person that took your information.  2. Exception #2 (Planned Surgery): In the event that you are scheduled by another doctor or dentist to have any type of surgery or procedure, you are allowed (for a period no longer than 30 days), to receive additional pain medication, for the acute post-op pain. However, in this case, you are responsible for picking up a copy of our "Post-op Pain Management for Surgeons" handout, and giving it to your surgeon or dentist. This document  is available at our office, and does not require an appointment to obtain it. Simply go to our office during business hours (Monday-Thursday from 8:00 AM to 4:00 PM) (Friday 8:00 AM to 12:00 Noon) or if you have a scheduled appointment with [place], prior to your surgery, and ask for it by name. In addition, you will need to provide [place] with your name, name of your surgeon, type of surgery, and date of procedure or surgery.  *Opioid medications include: morphine, codeine, oxycodone, oxymorphone, hydrocodone, hydromorphone, meperidine, tramadol, tapentadol, buprenorphine, fentanyl, methadone. **Benzodiazepine medications include: diazepam (Valium), alprazolam (Xanax), clonazepam (Klonopine), lorazepam (Ativan), clorazepate (Tranxene), chlordiazepoxide (Librium), estazolam (Prosom), oxazepam (Serax), temazepam (Restoril), triazolam (Halcion) (Last updated: 12/28/2017) ____________________________________________________________________________________________

## 2018-04-17 ENCOUNTER — Telehealth: Payer: Self-pay | Admitting: Internal Medicine

## 2018-04-17 ENCOUNTER — Ambulatory Visit: Payer: Self-pay | Admitting: Internal Medicine

## 2018-04-17 NOTE — Telephone Encounter (Signed)
Pt reports  Multiple  Bruises in various degrees of healing approx 10 days ago - denies any bleeding denies any injury.Denies any shortness of breath. Also reports some swelling of the l ankle that started yesterday.Patient reports symptoms did not start until taking Integra Pt has not taken her integra today   Pt advised to hold the Integra until seen tomorrow by the provider or notified otherwise. Appt made for tommorow with Mable Paris   Reason for Disposition . [1] Not caused by an injury AND [2] < 5 unexplained bruises  Answer Assessment - Initial Assessment Questions 1. LOCATION: "Which joint is swollen?"      L ANKLE   2. ONSET: "When did the swelling start?"       APPROX 10 DAYS AGO   3. SIZE: "How large is the swelling?"        Mild  Able to put foot in a shoe   4. PAIN: "Is there any pain?" If so, ask: "How bad is it?" (Scale 1-10; or mild, moderate, severe)     No  5. CAUSE: "What do you think caused the swollen joint?"     No  6. OTHER SYMPTOMS: "Do you have any other symptoms?" (e.g., fever, chest pain, difficulty breathing, calf pain)    Also having noticed some bruising both arms and legs for the last several week arms are worse varies degrees  Of healing  - these areas are spots   biggest one is the size denies any injury  Pt reports she was put on Integra approx 1 month ago by Dr Nicki Reaper   7. PREGNANCY: "Is there any chance you are pregnant?" "When was your last menstrual period?"     n/a  Answer Assessment - Initial Assessment Questions 1. APPEARANCE of BRUISE: "Describe the bruise."       Both arms and legs   In various of healing  l Arm is  More spots   2. SIZE: "How large is the bruise?"        Various  Sizes  l arm has one that is the size of a 50 cent piece   3. NUMBER: "How many bruises are there?"          Hard to tell  Because some are healing   4. LOCATION: "Where is the bruise located?"       Both arms and legs   5. ONSET: "How long ago did the bruise  occur?"         Several  Weeks ago   6. CAUSE: "Tell me how it happened."        No injurys  Started Integra  approx 1 month ago   7. MEDICAL HISTORY: "Do you have any medical problems that can cause easy bruising or bleeding?" (e.g., leukemia, liver disease, recent chemotherapy)        Low blood counts in past   8. MEDICATIONS : "Do you take any medications which thin the blood such as: aspirin, heparin, ibuprofen (NSAIDS), Plavix, or Coumadin?"     New medication Integra 1 month ago  9. OTHER SYMPTOMS: "Do you have any other symptoms?"  (e.g., weakness, dizziness, pain, fever, nosebleed, blood in urine/stool)       No   10. PREGNANCY: "Is there any chance you are pregnant?" "When was your last menstrual period?"       n/a  Protocols used: [place] [facility]

## 2018-04-17 NOTE — Telephone Encounter (Signed)
Patient triaged for bruises on arms and legs for approx 10 days - see triage encounter for details appt made for tomorrow with Mable Paris   Pt states bruises started about 10 days ago  Pt started taking Integra 02/26/2018   Pt states she did not have bruises like this until started integra    Should patient hold the Integra until seen by Provider tomorrow  Pt was advised to hold the med until tomorrow unless otherwise notified

## 2018-04-17 NOTE — Telephone Encounter (Signed)
FYI

## 2018-04-17 NOTE — Telephone Encounter (Signed)
FYI patient is seeing [facility].

## 2018-04-18 ENCOUNTER — Telehealth: Payer: Self-pay

## 2018-04-18 ENCOUNTER — Ambulatory Visit
Admission: RE | Admit: 2018-04-18 | Discharge: 2018-04-18 | Disposition: A | Discharge status: Home / Self Care | Payer: Medicare Other | Source: Ambulatory Visit | Attending: Family | Admitting: Family

## 2018-04-18 ENCOUNTER — Ambulatory Visit (INDEPENDENT_AMBULATORY_CARE_PROVIDER_SITE_OTHER): Payer: Medicare Other | Admitting: Family

## 2018-04-18 VITALS — BP 150/94 | HR 83 | Temp 98.4°F | Resp 12 | Wt 151.0 lb

## 2018-04-18 DIAGNOSIS — M7122 Synovial cyst of popliteal space [Baker], left knee: Secondary | ICD-10-CM | POA: Insufficient documentation

## 2018-04-18 DIAGNOSIS — M7989 Other specified soft tissue disorders: Secondary | ICD-10-CM

## 2018-04-18 DIAGNOSIS — R600 Localized edema: Secondary | ICD-10-CM | POA: Diagnosis not present

## 2018-04-18 NOTE — Telephone Encounter (Signed)
Denise Barry is an iron supplement.  Should not contribute to bruising.  Agree with evaluation.

## 2018-04-18 NOTE — Telephone Encounter (Signed)
Pt being seen by Joycelyn Schmid today at 2:30

## 2018-04-18 NOTE — Patient Instructions (Signed)
Stat left leg ultrasound  Please keep phone on you. If blood clot, we will likely have to send you to emergency room- otherwise, we will start a diuretic.

## 2018-04-18 NOTE — Telephone Encounter (Signed)
Call with ultrasound results Haskell Riling per verbal permission from patient [phone] .

## 2018-04-18 NOTE — Progress Notes (Signed)
Subjective:    Patient ID: Denise Barry, female    DOB: 05/20/1934, 84 y.o.   MRN: [id number]  CC: Denise Barry is a 84 y.o. female who presents today for an acute visit.    HPI: CC: left leg swelling x 2 weeks, worsened. Tender to touch, 'puffier'  No recent surgery . No h/o cancer. No h/o DVT, no bleeding disorders. No changes to activity, uses cane. On estrace vaginal cream.  HTN- On amlodipine 10mg   Chronic trigeminal neuralgia, managed by neurology- held her aptiom yesterday as thought that was cause       HISTORY:  Past Medical History:  Diagnosis Date  . Allergy   . Diverticulitis    11/18  . Hypercholesterolemia   . Hypertension   . Osteoarthritis   . Urinary incontinence    pessary in place  . Varicose veins    H/O   Past Surgical History:  Procedure Laterality Date  . BACK SURGERY  09/16/13  . DILATION AND CURETTAGE OF UTERUS  1971  . JOINT REPLACEMENT    . sclerosis  2000  . TUBAL LIGATION  1972   Family History  Problem Relation Age of Onset  . Heart disease Mother   . Diabetes Mother   . Hypertension Mother   . Heart disease Father   . Stroke Father   . Cancer Son        bladder  . COPD Son   . Breast cancer Neg Hx     Allergies: Codeine; Cyclobenzaprine; Methocarbamol; Carbamazepine; and Gabapentin Current Outpatient Medications on File Prior to Visit  Medication Sig Dispense Refill  . amLODipine (NORVASC) 10 MG tablet Take 1 tablet (10 mg total) by mouth daily. 30 tablet 3  . aspirin 81 MG tablet Take 81 mg by mouth daily.    . Eslicarbazepine Acetate 400 MG TABS Take 1 tablet by mouth 3 (three) times daily.    Marland Kitchen estradiol (ESTRACE VAGINAL) 0.1 MG/GM vaginal cream Apply PRN    . Fe Fum-FePoly-Vit C-Vit B3 (INTEGRA) 62.5-62.5-40-3 MG CAPS Take one tablet daily 30 capsule 2  . fexofenadine (ALLEGRA) 180 MG tablet Take 180 mg by mouth as needed for allergies or rhinitis.    . Multiple Vitamin (MULTI-VITAMINS) TABS Take 1  tablet by mouth.     . traMADol (ULTRAM) 50 MG tablet Take 1 tablet (50 mg total) by mouth every 6 (six) hours as needed for severe pain. 120 tablet 1  . Acetaminophen (TYLENOL EXTRA STRENGTH PO) Take 1-2 tablets by mouth as needed.      No current facility-administered medications on file prior to visit.     Social History   Tobacco Use  . Smoking status: Former Research scientist (life sciences)  . Smokeless tobacco: Never Used  Substance Use Topics  . Alcohol use: No    Alcohol/week: 0.0 oz  . Drug use: No    Review of Systems  Constitutional: Negative for chills and fever.  Respiratory: Negative for cough and shortness of breath.   Cardiovascular: Positive for leg swelling. Negative for chest pain and palpitations.  Gastrointestinal: Negative for nausea and vomiting.  Skin: Negative for rash and wound.      Objective:    BP (!) 150/94 (BP Location: Right Arm, Patient Position: Sitting, Cuff Size: Normal)   Pulse 83   Temp 98.4 F (36.9 C) (Oral)   Resp 12   Wt 151 lb (68.5 kg)   SpO2 97%   BMI 24.75 kg/m    Physical  Exam  Constitutional: She appears well-developed and well-nourished.  Eyes: Conjunctivae are normal.  Cardiovascular: Normal rate, regular rhythm, normal heart sounds and normal pulses.  LLE +1 pitting edema. No palpable cords or masses. No erythema or increased warmth. Skin intact No asymmetry in calf size when compared bilaterally No hair growth noted BLE.  No discoloration of varicosities noted.  LE warm and palpable pedal pulses.   Pulmonary/Chest: Effort normal and breath sounds normal. She has no wheezes. She has no rhonchi. She has no rales.  Neurological: She is alert.  Skin: Skin is warm and dry.  Psychiatric: She has a normal mood and affect. Her speech is normal and behavior is normal. Thought content normal.  Vitals reviewed.      Assessment & Plan:   1. Left leg swelling Patient well-appearing. No respiratory distress.  HR 83.  Swelling is unilateral.   Concern for DVT.  Pending stat ultrasound. - US Venous Img Lower Unilateral Left    I am having Denise Barry maintain her aspirin, fexofenadine, Acetaminophen (TYLENOL EXTRA STRENGTH PO), estradiol, MULTI-VITAMINS, Eslicarbazepine Acetate, amLODipine, INTEGRA, and traMADol.   No orders of the defined types were placed in this encounter.   Return precautions given.   Risks, benefits, and alternatives of the medications and treatment plan prescribed today were discussed, and patient expressed understanding.   Education regarding symptom management and diagnosis given to patient on AVS.  Continue to follow with Einar Pheasant, MD for routine health maintenance.   Denise Barry and I agreed with plan.   Mable Paris, FNP

## 2018-04-19 NOTE — Telephone Encounter (Signed)
Copied from [place] [id number]. Topic: Inquiry >> Apr 19, 2018  4:04 PM Mylinda Latina, NT wrote: Reason for CRM: patient called and states she had an U/S done yesterday and would like to know the results. Requesting a call back  CB# [phone]

## 2018-04-19 NOTE — Telephone Encounter (Signed)
Please advise 

## 2018-04-19 NOTE — Telephone Encounter (Signed)
Spoke with patient she is having some swelling in left ankle , not worse than yesterday. No new symptoms .   I advised patient that you were out of the office. She states she will be out this evening for dinner and back around 8 pm .

## 2018-04-19 NOTE — Telephone Encounter (Signed)
Pt called and was updated that she will get a call when images are ready

## 2018-04-20 ENCOUNTER — Telehealth: Payer: Self-pay | Admitting: Internal Medicine

## 2018-04-20 NOTE — Telephone Encounter (Signed)
Patient called for result venous US results given along with care advice prescribed by NP patient stated the leg is some better declined the fluid pill offered at this time advised patient to follow care instruction s if leg becomes more painful , swollen, or feels heat to be reevaluated over the weekend at [facility] patient agreed and advised her to call on Monday if leg is no better .

## 2018-04-23 NOTE — Telephone Encounter (Signed)
Patient still having swelling and tender ness to legs but not worse still using Ice and elevation. Patient ask should she use compression stockings?

## 2018-04-23 NOTE — Telephone Encounter (Signed)
If she would like for me to evaluate and determine best way to treat, I can see her tomorrow.

## 2018-04-23 NOTE — Telephone Encounter (Signed)
Relation to pt: self Call back number: [phone]  Reason for call:  Patient denies pain and states swelling has gone down but still slightly present, patient requesting to speak with Juliann Pulse, please advise

## 2018-04-23 NOTE — Telephone Encounter (Signed)
Patient scheduled for 930 04/24/18. FYI

## 2018-04-24 ENCOUNTER — Encounter: Payer: Self-pay | Admitting: Internal Medicine

## 2018-04-24 ENCOUNTER — Ambulatory Visit (INDEPENDENT_AMBULATORY_CARE_PROVIDER_SITE_OTHER): Payer: Medicare Other | Admitting: Internal Medicine

## 2018-04-24 VITALS — BP 156/88 | HR 89 | Temp 97.7°F | Resp 18 | Wt 152.0 lb

## 2018-04-24 DIAGNOSIS — I10 Essential (primary) hypertension: Secondary | ICD-10-CM

## 2018-04-24 DIAGNOSIS — M5136 Other intervertebral disc degeneration, lumbar region: Secondary | ICD-10-CM

## 2018-04-24 DIAGNOSIS — G500 Trigeminal neuralgia: Secondary | ICD-10-CM

## 2018-04-24 DIAGNOSIS — R600 Localized edema: Secondary | ICD-10-CM

## 2018-04-24 DIAGNOSIS — D649 Anemia, unspecified: Secondary | ICD-10-CM

## 2018-04-24 DIAGNOSIS — I890 Lymphedema, not elsewhere classified: Secondary | ICD-10-CM | POA: Insufficient documentation

## 2018-04-24 LAB — BASIC METABOLIC PANEL
BUN: 18 mg/dL (ref 6–23)
CHLORIDE: 100 meq/L (ref 96–112)
CO2: 31 meq/L (ref 19–32)
CREATININE: 0.73 mg/dL (ref 0.40–1.20)
Calcium: 9.5 mg/dL (ref 8.4–10.5)
GFR: 81.15 mL/min (ref >60.00)
Glucose, Bld: 99 mg/dL (ref 70–99)
Potassium: 4.1 mEq/L (ref 3.5–5.1)
Sodium: 138 mEq/L (ref 135–145)

## 2018-04-24 LAB — HEPATIC FUNCTION PANEL
ALT: 11 U/L (ref 0–35)
AST: 18 U/L (ref 0–37)
Albumin: 4 g/dL (ref 3.5–5.2)
Alkaline Phosphatase: 78 U/L (ref 39–117)
BILIRUBIN DIRECT: 0 mg/dL (ref 0.0–0.3)
BILIRUBIN TOTAL: 0.3 mg/dL (ref 0.2–1.2)
Total Protein: 6.8 g/dL (ref 6.0–8.3)

## 2018-04-24 LAB — CBC WITH DIFFERENTIAL/PLATELET
BASOS ABS: 0 10*3/uL (ref 0.0–0.1)
BASOS PCT: 0.7 % (ref 0.0–3.0)
EOS ABS: 0.1 10*3/uL (ref 0.0–0.7)
Eosinophils Relative: 1.7 % (ref 0.0–5.0)
HCT: 33.2 % — ABNORMAL LOW (ref 36.0–46.0)
Hemoglobin: 10.7 g/dL — ABNORMAL LOW (ref 12.0–15.0)
Lymphocytes Relative: 18.9 % (ref 12.0–46.0)
Lymphs Abs: 1.1 10*3/uL (ref 0.7–4.0)
MCHC: 32.3 g/dL (ref 30.0–36.0)
MCV: 78.3 fl (ref 78.0–100.0)
MONO ABS: 0.8 10*3/uL (ref 0.1–1.0)
Monocytes Relative: 13.5 % — ABNORMAL HIGH (ref 3.0–12.0)
NEUTROS ABS: 3.6 10*3/uL (ref 1.4–7.7)
Neutrophils Relative %: 65.2 % (ref 43.0–77.0)
PLATELETS: 355 10*3/uL (ref 150.0–400.0)
RBC: 4.24 Mil/uL (ref 3.87–5.11)
RDW: 23.5 % — AB (ref 11.5–15.5)
WBC: 5.6 10*3/uL (ref 4.0–10.5)

## 2018-04-24 LAB — FERRITIN: Ferritin: 9.5 ng/mL — ABNORMAL LOW (ref 10.0–291.0)

## 2018-04-24 LAB — TSH: TSH: 1.66 u[IU]/mL (ref 0.35–4.50)

## 2018-04-24 MED ORDER — TRIAMCINOLONE ACETONIDE 0.1 % EX CREA: 1.0000 "application " | TOPICAL_CREAM | Freq: Two times a day (BID) | CUTANEOUS | 0 refills | Status: AC

## 2018-04-24 NOTE — Progress Notes (Signed)
Patient ID: Denise Barry, female   DOB: 27-Feb-1934, 84 y.o.   MRN: [id number]   Subjective:    Patient ID: Denise Barry, female    DOB: [birth year], 84 y.o.   MRN: [id number]  HPI  Patient here as a work in with concerns regarding leg swelling.  She was seen 04/18/18 for left leg swelling.  Had ultrasound - negative for DVT.  She does have left Bakers cyst.  Reports swelling is some better.  No chest pain.  No sob.  Eating well.  No acid reflux.  No abdominal pain.  Swelling more pronounced lower left leg.  No increased erythema.  Not wearing compression hose.  Is some better in am.  Did previously notice a pink tinge in her urine.  No vaginal bleeding and no rectal bleeding.  Has not noticed since that episode.  No dysuria.    Past Medical History:  Diagnosis Date  . Allergy   . Diverticulitis    11/18  . Hypercholesterolemia   . Hypertension   . Osteoarthritis   . Urinary incontinence    pessary in place  . Varicose veins    H/O   Past Surgical History:  Procedure Laterality Date  . BACK SURGERY  09/16/13  . DILATION AND CURETTAGE OF UTERUS  1971  . JOINT REPLACEMENT    . sclerosis  2000  . TUBAL LIGATION  1972   Family History  Problem Relation Age of Onset  . Heart disease Mother   . Diabetes Mother   . Hypertension Mother   . Heart disease Father   . Stroke Father   . Cancer Son        bladder  . COPD Son   . Breast cancer Neg Hx    Social History   Socioeconomic History  . Marital status: Widowed    Spouse name: Not on file  . Number of children: Not on file  . Years of education: Not on file  . Highest education level: Not on file  Occupational History  . Not on file  Social Needs  . Financial resource strain: Not on file  . Food insecurity:    Worry: Not on file    Inability: Not on file  . Transportation needs:    Medical: Not on file    Non-medical: Not on file  Tobacco Use  . Smoking status: Former Research scientist (life sciences)  . Smokeless tobacco:  Never Used  Substance and Sexual Activity  . Alcohol use: No    Alcohol/week: 0.0 oz  . Drug use: No  . Sexual activity: Never  Lifestyle  . Physical activity:    Days per week: Not on file    Minutes per session: Not on file  . Stress: Not on file  Relationships  . Social connections:    Talks on phone: Not on file    Gets together: Not on file    Attends religious service: Not on file    Active member of club or organization: Not on file    Attends meetings of clubs or organizations: Not on file    Relationship status: Not on file  Other Topics Concern  . Not on file  Social History Narrative  . Not on file    Outpatient Encounter Medications as of 04/24/2018  Medication Sig  . Acetaminophen (TYLENOL EXTRA STRENGTH PO) Take 1-2 tablets by mouth as needed.   Marland Kitchen amLODipine (NORVASC) 10 MG tablet Take 1 tablet (10 mg total) by mouth daily.  Marland Kitchen  aspirin 81 MG tablet Take 81 mg by mouth daily.  . Eslicarbazepine Acetate 400 MG TABS Take 1 tablet by mouth 3 (three) times daily.  Marland Kitchen estradiol (ESTRACE VAGINAL) 0.1 MG/GM vaginal cream Apply PRN  . Fe Fum-FePoly-Vit C-Vit B3 (INTEGRA) 62.5-62.5-40-3 MG CAPS Take one tablet daily  . fexofenadine (ALLEGRA) 180 MG tablet Take 180 mg by mouth as needed for allergies or rhinitis.  . Multiple Vitamin (MULTI-VITAMINS) TABS Take 1 tablet by mouth.   . traMADol (ULTRAM) 50 MG tablet Take 1 tablet (50 mg total) by mouth every 6 (six) hours as needed for severe pain.  Marland Kitchen triamcinolone cream (KENALOG) 0.1 % Apply 1 application topically 2 (two) times daily.   No facility-administered encounter medications on file as of 04/24/2018.     Review of Systems  Constitutional: Negative for appetite change, fever and unexpected weight change.  Respiratory: Negative for cough, chest tightness and shortness of breath.   Cardiovascular: Negative for chest pain and palpitations.       Left lower leg swelling more than right.  Has improved some.     Gastrointestinal: Negative for abdominal pain, diarrhea, nausea and vomiting.  Genitourinary: Negative for difficulty urinating and dysuria.  Musculoskeletal: Negative for joint swelling and myalgias.       No knee pain.  No calf tenderness.   Skin: Negative for color change and rash.  Neurological: Negative for dizziness and headaches.  Psychiatric/Behavioral: Negative for agitation and dysphoric mood.       Objective:    Physical Exam  Constitutional: She appears well-developed and well-nourished. No distress.  HENT:  Nose: Nose normal.  Mouth/Throat: Oropharynx is clear and moist.  Neck: Neck supple. No thyromegaly present.  Cardiovascular: Normal rate and regular rhythm.  Pulmonary/Chest: Breath sounds normal. No respiratory distress. She has no wheezes.  Abdominal: Soft. Bowel sounds are normal. There is no tenderness.  Musculoskeletal: She exhibits no tenderness.  Some left lower extremity swelling compared to right.  No calf tenderness.  No pain in the popliteal region.  No increased erythema.    Lymphadenopathy:    She has no cervical adenopathy.  Skin: No rash noted. No erythema.  Psychiatric: She has a normal mood and affect. Her behavior is normal.    BP (!) 156/88 (BP Location: Left Arm, Patient Position: Sitting, Cuff Size: Normal)   Pulse 89   Temp 97.7 F (36.5 C) (Oral)   Resp 18   Wt 152 lb (68.9 kg)   SpO2 98%   BMI 24.91 kg/m  Wt Readings from Last 3 Encounters:  04/24/18 152 lb (68.9 kg)  04/18/18 151 lb (68.5 kg)  04/02/18 150 lb (68 kg)     Lab Results  Component Value Date   WBC 5.6 04/24/2018   HGB 10.7 (L) 04/24/2018   HCT 33.2 (L) 04/24/2018   PLT 355.0 04/24/2018   GLUCOSE 99 04/24/2018   CHOL 206 (H) 11/22/2017   TRIG 101.0 11/22/2017   HDL 74.00 11/22/2017   LDLCALC 112 (H) 11/22/2017   ALT 11 04/24/2018   AST 18 04/24/2018   NA 138 04/24/2018   K 4.1 04/24/2018   CL 100 04/24/2018   CREATININE 0.73 04/24/2018   BUN 18  04/24/2018   CO2 31 04/24/2018   TSH 1.66 04/24/2018   INR 1.0 02/03/2015    US Venous Img Lower Unilateral Left  Result Date: 04/18/2018 CLINICAL DATA:  Left lower extremity edema for the past 2 weeks. Evaluate for DVT. EXAM: LEFT LOWER EXTREMITY  VENOUS DOPPLER ULTRASOUND TECHNIQUE: Gray-scale sonography with graded compression, as well as color Doppler and duplex ultrasound were performed to evaluate the lower extremity deep venous systems from the level of the common femoral vein and including the common femoral, femoral, profunda femoral, popliteal and calf veins including the posterior tibial, peroneal and gastrocnemius veins when visible. The superficial great saphenous vein was also interrogated. Spectral Doppler was utilized to evaluate flow at rest and with distal augmentation maneuvers in the common femoral, femoral and popliteal veins. COMPARISON:  None. FINDINGS: Contralateral Common Femoral Vein: Respiratory phasicity is normal and symmetric with the symptomatic side. No evidence of thrombus. Normal compressibility. Common Femoral Vein: No evidence of thrombus. Normal compressibility, respiratory phasicity and response to augmentation. Saphenofemoral Junction: No evidence of thrombus. Normal compressibility and flow on color Doppler imaging. Profunda Femoral Vein: No evidence of thrombus. Normal compressibility and flow on color Doppler imaging. Femoral Vein: No evidence of thrombus. Normal compressibility, respiratory phasicity and response to augmentation. Popliteal Vein: No evidence of thrombus. Normal compressibility, respiratory phasicity and response to augmentation. Calf Veins: No evidence of thrombus. Normal compressibility and flow on color Doppler imaging. Superficial Great Saphenous Vein: No evidence of thrombus. Normal compressibility. Venous Reflux:  None. Other Findings: Note is made of an approximately 3.1 x 1.5 x 1.4 cm serpiginous anechoic fluid collection with the left  popliteal fossa compatible with a Baker cyst. IMPRESSION: 1. No evidence of DVT within the left lower extremity. 2. Incidentally noted approximately 3.1 cm serpiginous anechoic left-sided Baker cyst. Electronically Signed   By: Sandi Mariscal M.D.   On: 04/18/2018 17:05       Assessment & Plan:   Problem List Items Addressed This Visit    Anemia    Has a history of iron deficient anemia.  Recently stopped her iron because she felt it could be contributing to her swelling.  Will restart. Check cbc and ferritin.        Relevant Orders   CBC with Differential/Platelet (Completed)   Ferritin (Completed)   Chronic Trigeminal neuralgia (Right) (Chronic)    Followed by neurology.  On aptiom.  Doing well.        DDD (degenerative disc disease), lumbar (Chronic)    Followed by pain clinic.  Stable.        Essential hypertension, benign    Blood pressure on recheck improved.  Continue same medication regimen.  Follow pressures.  Follow metabolic panel.       Lower extremity edema - Primary    Recent ultrasound negative for DVT.  Some persistent swelling.  She has been on norvasc 10mg  for a long time.  Swelling just started over the last two weeks.  Hold on changing medication.  Discussed diet and activity.  Elevate leg when sitting.  Compression hose.  Has a baker's cyst.  No pain.  Will have vascular surgery evaluate - venous insufficiency.        Relevant Orders   Hepatic function panel (Completed)   TSH (Completed)   Basic metabolic panel (Completed)   Ambulatory referral to Vascular Surgery       Einar Pheasant, MD

## 2018-04-25 ENCOUNTER — Telehealth: Payer: Self-pay | Admitting: Internal Medicine

## 2018-04-25 NOTE — Telephone Encounter (Signed)
Pt aware.

## 2018-04-25 NOTE — Telephone Encounter (Signed)
Copied from [place] [id number]. Topic: Quick Communication - See Telephone Encounter >> Apr 25, 2018 12:52 PM Vernona Rieger wrote: CRM for notification. See Telephone encounter for: 04/25/18.  Patient wants to know does she still need to have labs done on 7/3?

## 2018-04-25 NOTE — Telephone Encounter (Signed)
We did labs when she was here.  She does not need to come in for lab on 05/02/18.

## 2018-04-25 NOTE — Telephone Encounter (Signed)
See other phone note

## 2018-04-25 NOTE — Telephone Encounter (Signed)
Copied from [place] [id number]. Topic: Quick Communication - See Telephone Encounter >> Apr 25, 2018 12:50 PM Vernona Rieger wrote: CRM for notification. See Telephone encounter for: 04/25/18.  Patient would like her results from 6/25. Call back at [phone]

## 2018-04-25 NOTE — Telephone Encounter (Signed)
Patient is scheduled for CBC and sodium on 7/3. Just had labs done on 6/25. Does she still need to have these done on 7/3?

## 2018-04-28 ENCOUNTER — Encounter: Payer: Self-pay | Admitting: Internal Medicine

## 2018-04-28 NOTE — Assessment & Plan Note (Signed)
Followed by pain clinic.  Stable.  

## 2018-04-28 NOTE — Assessment & Plan Note (Signed)
Blood pressure on recheck improved.  Continue same medication regimen.  Follow pressures.  Follow metabolic panel.   

## 2018-04-28 NOTE — Assessment & Plan Note (Signed)
Recent ultrasound negative for DVT.  Some persistent swelling.  She has been on norvasc 10mg  for a long time.  Swelling just started over the last two weeks.  Hold on changing medication.  Discussed diet and activity.  Elevate leg when sitting.  Compression hose.  Has a baker's cyst.  No pain.  Will have vascular surgery evaluate - venous insufficiency.

## 2018-04-28 NOTE — Assessment & Plan Note (Signed)
Followed by neurology.  On aptiom.  Doing well.

## 2018-04-28 NOTE — Assessment & Plan Note (Signed)
Has a history of iron deficient anemia.  Recently stopped her iron because she felt it could be contributing to her swelling.  Will restart. Check cbc and ferritin.

## 2018-05-02 ENCOUNTER — Other Ambulatory Visit: Payer: Medicare Other

## 2018-05-17 ENCOUNTER — Ambulatory Visit (INDEPENDENT_AMBULATORY_CARE_PROVIDER_SITE_OTHER): Payer: Medicare Other | Admitting: Vascular Surgery

## 2018-05-17 ENCOUNTER — Encounter (INDEPENDENT_AMBULATORY_CARE_PROVIDER_SITE_OTHER): Payer: Self-pay | Admitting: Vascular Surgery

## 2018-05-17 VITALS — BP 156/73 | HR 80 | Resp 16 | Ht 66.5 in | Wt 151.0 lb

## 2018-05-17 DIAGNOSIS — I872 Venous insufficiency (chronic) (peripheral): Secondary | ICD-10-CM | POA: Diagnosis not present

## 2018-05-17 DIAGNOSIS — I890 Lymphedema, not elsewhere classified: Secondary | ICD-10-CM | POA: Diagnosis not present

## 2018-05-17 DIAGNOSIS — I10 Essential (primary) hypertension: Secondary | ICD-10-CM | POA: Diagnosis not present

## 2018-05-17 DIAGNOSIS — M5136 Other intervertebral disc degeneration, lumbar region: Secondary | ICD-10-CM

## 2018-05-17 NOTE — Progress Notes (Signed)
MRN : [id number]  Denise Barry is a 84 y.o. (05/20/1934) female who presents with chief complaint of  Chief Complaint  Patient presents with  . New Patient (Initial Visit)    ref Nicki Reaper for bil leg swelling  .  History of Present Illness: Patient is seen for evaluation of leg pain and leg swelling. The patient first noticed the swelling remotely. The swelling is associated with pain and discoloration. The pain and swelling worsens with prolonged dependency and improves with elevation. The pain is unrelated to activity.  The patient notes that in the morning the legs are significantly improved but they steadily worsened throughout the course of the day. The patient also notes a steady worsening of the discoloration in the ankle and shin area.   The patient denies claudication symptoms.  The patient denies symptoms consistent with rest pain.  The patient denies and extensive history of DJD and LS spine disease.  The patient has no had any past angiography, interventions or vascular surgery.  Elevation makes the leg symptoms better, dependency makes them much worse. There is no history of ulcerations. The patient denies any recent changes in medications.  The patient has not been wearing graduated compression.  The patient denies a history of DVT or PE. There is no prior history of phlebitis. There is no history of primary lymphedema.  No history of malignancies. No history of trauma or groin or pelvic surgery. There is no history of radiation treatment to the groin or pelvis  The patient denies amaurosis fugax or recent TIA symptoms. There are no recent neurological changes noted. The patient denies recent episodes of angina or shortness of breath  Current Meds  Medication Sig  . Acetaminophen (TYLENOL EXTRA STRENGTH PO) Take 1-2 tablets by mouth as needed.   Marland Kitchen amLODipine (NORVASC) 10 MG tablet Take 1 tablet (10 mg total) by mouth daily.  Marland Kitchen aspirin 81 MG tablet Take 81 mg by  mouth daily.  . Eslicarbazepine Acetate 400 MG TABS Take 1 tablet by mouth 3 (three) times daily.  Marland Kitchen estradiol (ESTRACE VAGINAL) 0.1 MG/GM vaginal cream Apply PRN  . Fe Fum-FePoly-Vit C-Vit B3 (INTEGRA) 62.5-62.5-40-3 MG CAPS Take one tablet daily  . fexofenadine (ALLEGRA) 180 MG tablet Take 180 mg by mouth as needed for allergies or rhinitis.  . Multiple Vitamin (MULTI-VITAMINS) TABS Take 1 tablet by mouth.   . traMADol (ULTRAM) 50 MG tablet Take 1 tablet (50 mg total) by mouth every 6 (six) hours as needed for severe pain.  Marland Kitchen triamcinolone cream (KENALOG) 0.1 % Apply 1 application topically 2 (two) times daily.    Past Medical History:  Diagnosis Date  . Allergy   . Diverticulitis    11/18  . Hypercholesterolemia   . Hypertension   . Osteoarthritis   . Urinary incontinence    pessary in place  . Varicose veins    H/O    Past Surgical History:  Procedure Laterality Date  . BACK SURGERY  09/16/13  . DILATION AND CURETTAGE OF UTERUS  1971  . JOINT REPLACEMENT    . sclerosis  2000  . TUBAL LIGATION  1972    Social History Social History   Tobacco Use  . Smoking status: Former Research scientist (life sciences)  . Smokeless tobacco: Never Used  Substance Use Topics  . Alcohol use: No    Alcohol/week: 0.0 oz  . Drug use: No    Family History Family History  Problem Relation Age of Onset  . Heart disease  Mother   . Diabetes Mother   . Hypertension Mother   . Heart disease Father   . Stroke Father   . Cancer Son        bladder  . COPD Son   . Breast cancer Neg Hx   No family history of bleeding/clotting disorders, porphyria or autoimmune disease   Allergies  Allergen Reactions  . Codeine Nausea Only    GI upset  . Cyclobenzaprine     Other reaction(s): Headache  . Methocarbamol     Other reaction(s): Other (See Comments)  . Carbamazepine Anxiety  . Gabapentin Anxiety     REVIEW OF SYSTEMS (Negative unless checked)  Constitutional: [] Weight loss  [] Fever  [] Chills Cardiac:  [] Chest pain   [] Chest pressure   [] Palpitations   [] Shortness of breath when laying flat   [] Shortness of breath with exertion. Vascular:  [x] Pain in legs with walking   [x] Pain in legs at rest  [] History of DVT   [] Phlebitis   [x] Swelling in legs   [] Varicose veins   [] Non-healing ulcers Pulmonary:   [] Uses home oxygen   [] Productive cough   [] Hemoptysis   [] Wheeze  [] COPD   [] Asthma Neurologic:  [] Dizziness   [] Seizures   [] History of stroke   [] History of TIA  [] Aphasia   [] Vissual changes   [] Weakness or numbness in arm   [] Weakness or numbness in leg Musculoskeletal:   [x] Joint swelling   [x] Joint pain   [x] Low back pain Hematologic:  [] Easy bruising  [] Easy bleeding   [] Hypercoagulable state   [] Anemic Gastrointestinal:  [] Diarrhea   [] Vomiting  [] Gastroesophageal reflux/heartburn   [] Difficulty swallowing. Genitourinary:  [] Chronic kidney disease   [] Difficult urination  [] Frequent urination   [] Blood in urine Skin:  [x] Rashes   [] Ulcers  Psychological:  [] History of anxiety   []  History of major depression.  Physical Examination  Vitals:   05/17/18 1004  BP: (!) 156/73  Pulse: 80  Resp: 16  Weight: 151 lb (68.5 kg)  Height: 5' 6.5" (1.689 m)   Body mass index is 24.01 kg/m. Gen: WD/WN, NAD Head: [place]/AT, No temporalis wasting.  Ear/Nose/Throat: Hearing grossly intact, nares w/o erythema or drainage, poor dentition Eyes: PER, EOMI, sclera nonicteric.  Neck: Supple, no masses.  No bruit or JVD.  Pulmonary:  Good air movement, clear to auscultation bilaterally, no use of accessory muscles.  Cardiac: RRR, normal S1, S2, no Murmurs. Vascular: scattered varicosities present bilaterally.  Mild venous stasis changes to the legs bilaterally.  3+ soft pitting edema Vessel Right Left  Radial Palpable Palpable  PT Palpable Palpable  DP Palpable Palpable  Gastrointestinal: soft, non-distended. No guarding/no peritoneal signs.  Musculoskeletal: M/S 5/5 throughout.  No deformity or  atrophy.  Neurologic: CN 2-12 intact. Pain and light touch intact in extremities.  Symmetrical.  Speech is fluent. Motor exam as listed above. Psychiatric: Judgment intact, Mood & affect appropriate for pt's clinical situation. Dermatologic: venous rashes no ulcers noted.  No changes consistent with cellulitis. Lymph : No Cervical lymphadenopathy, no lichenification or skin changes of chronic lymphedema.  CBC Lab Results  Component Value Date   WBC 5.6 04/24/2018   HGB 10.7 (L) 04/24/2018   HCT 33.2 (L) 04/24/2018   MCV 78.3 04/24/2018   PLT 355.0 04/24/2018    BMET    Component Value Date/Time   NA 138 04/24/2018 1018   NA 138 08/28/2017 1358   NA 135 02/11/2015 0538   K 4.1 04/24/2018 1018   K 3.7 02/11/2015 0538  CL 100 04/24/2018 1018   CL 103 02/11/2015 0538   CO2 31 04/24/2018 1018   CO2 29 02/11/2015 0538   GLUCOSE 99 04/24/2018 1018   GLUCOSE 100 (H) 02/11/2015 0538   BUN 18 04/24/2018 1018   BUN 17 08/28/2017 1358   BUN 13 02/11/2015 0538   CREATININE 0.73 04/24/2018 1018   CREATININE 0.67 02/11/2015 0538   CALCIUM 9.5 04/24/2018 1018   CALCIUM 7.6 (L) 02/11/2015 0538   GFRNONAA 59 (L) 10/06/2017 0444   GFRNONAA >60 02/11/2015 0538   GFRAA >60 10/06/2017 0444   GFRAA >60 02/11/2015 0538   CrCl cannot be calculated (Patient's most recent lab result is older than the maximum 21 days allowed.).  COAG Lab Results  Component Value Date   INR 1.0 02/03/2015   INR 0.9 01/12/2014    Radiology US Venous Img Lower Unilateral Left  Result Date: 04/18/2018 CLINICAL DATA:  Left lower extremity edema for the past 2 weeks. Evaluate for DVT. EXAM: LEFT LOWER EXTREMITY VENOUS DOPPLER ULTRASOUND TECHNIQUE: Gray-scale sonography with graded compression, as well as color Doppler and duplex ultrasound were performed to evaluate the lower extremity deep venous systems from the level of the common femoral vein and including the common femoral, femoral, profunda femoral,  popliteal and calf veins including the posterior tibial, peroneal and gastrocnemius veins when visible. The superficial great saphenous vein was also interrogated. Spectral Doppler was utilized to evaluate flow at rest and with distal augmentation maneuvers in the common femoral, femoral and popliteal veins. COMPARISON:  None. FINDINGS: Contralateral Common Femoral Vein: Respiratory phasicity is normal and symmetric with the symptomatic side. No evidence of thrombus. Normal compressibility. Common Femoral Vein: No evidence of thrombus. Normal compressibility, respiratory phasicity and response to augmentation. Saphenofemoral Junction: No evidence of thrombus. Normal compressibility and flow on color Doppler imaging. Profunda Femoral Vein: No evidence of thrombus. Normal compressibility and flow on color Doppler imaging. Femoral Vein: No evidence of thrombus. Normal compressibility, respiratory phasicity and response to augmentation. Popliteal Vein: No evidence of thrombus. Normal compressibility, respiratory phasicity and response to augmentation. Calf Veins: No evidence of thrombus. Normal compressibility and flow on color Doppler imaging. Superficial Great Saphenous Vein: No evidence of thrombus. Normal compressibility. Venous Reflux:  None. Other Findings: Note is made of an approximately 3.1 x 1.5 x 1.4 cm serpiginous anechoic fluid collection with the left popliteal fossa compatible with a Baker cyst. IMPRESSION: 1. No evidence of DVT within the left lower extremity. 2. Incidentally noted approximately 3.1 cm serpiginous anechoic left-sided Baker cyst. Electronically Signed   By: Sandi Mariscal M.D.   On: 04/18/2018 17:05      Assessment/Plan 1. Lymphedema Recommend:  No surgery or intervention at this point in time.    I have reviewed my previous discussion with the patient regarding swelling and why it causes symptoms.  Patient will continue wearing graduated compression stockings class 1 (20-30 mmHg)  on a daily basis. The patient will  beginning wearing the stockings first thing in the morning and removing them in the evening. The patient is instructed specifically not to sleep in the stockings.    In addition, behavioral modification including several periods of elevation of the lower extremities during the day will be continued.  This was reviewed with the patient during the initial visit.  The patient will also continue routine exercise, especially walking on a daily basis as was discussed during the initial visit.    Despite conservative treatments including graduated compression therapy class 1  and behavioral modification including exercise and elevation the patient  has not obtained adequate control of the lymphedema.  The patient still has stage 3 lymphedema and therefore, I believe that a lymph pump should be added to improve the control of the patient's lymphedema.  Additionally, a lymph pump is warranted because it will reduce the risk of cellulitis and ulceration in the future.  Patient should follow-up in six months    2. Chronic venous insufficiency No surgery or intervention at this point in time.    I have had a long discussion with the patient regarding venous insufficiency and why it  causes symptoms. I have discussed with the patient the chronic skin changes that accompany venous insufficiency and the long term sequela such as infection and ulceration.  Patient will begin wearing graduated compression stockings class 1 (20-30 mmHg) or compression wraps on a daily basis a prescription was given. The patient will put the stockings on first thing in the morning and removing them in the evening. The patient is instructed specifically not to sleep in the stockings.    In addition, behavioral modification including several periods of elevation of the lower extremities during the day will be continued. I have demonstrated that proper elevation is a position with the ankles at heart  level.  The patient is instructed to begin routine exercise, especially walking on a daily basis  Patient's duplex ultrasound of the venous system shows that DVT or reflux is not present.  Following the review of the ultrasound the patient will follow up in 6 months to reassess the degree of swelling and the control that graduated compression stockings or compression wraps  is offering.   The patient can be assessed for a Lymph Pump at that time  3. Essential hypertension, benign Continue antihypertensive medications as already ordered, these medications have been reviewed and there are no changes at this time.   4. DDD (degenerative disc disease), lumbar Continue NSAID medications as already ordered, these medications have been reviewed and there are no changes at this time.  Continued activity and therapy was stressed.    Hortencia Pilar, MD  05/17/2018 10:26 AM

## 2018-05-20 ENCOUNTER — Encounter (INDEPENDENT_AMBULATORY_CARE_PROVIDER_SITE_OTHER): Payer: Self-pay | Admitting: Vascular Surgery

## 2018-05-20 DIAGNOSIS — I872 Venous insufficiency (chronic) (peripheral): Secondary | ICD-10-CM | POA: Insufficient documentation

## 2018-05-31 ENCOUNTER — Encounter: Payer: Self-pay | Admitting: Nurse Practitioner

## 2018-05-31 ENCOUNTER — Ambulatory Visit (HOSPITAL_BASED_OUTPATIENT_CLINIC_OR_DEPARTMENT_OTHER): Payer: Medicare Other | Admitting: Nurse Practitioner

## 2018-05-31 ENCOUNTER — Other Ambulatory Visit: Payer: Self-pay

## 2018-05-31 VITALS — BP 169/76 | HR 82 | Temp 97.8°F | Resp 16 | Ht 66.0 in | Wt 155.0 lb

## 2018-05-31 DIAGNOSIS — Z7982 Long term (current) use of aspirin: Secondary | ICD-10-CM | POA: Insufficient documentation

## 2018-05-31 DIAGNOSIS — G894 Chronic pain syndrome: Secondary | ICD-10-CM | POA: Insufficient documentation

## 2018-05-31 DIAGNOSIS — Z885 Allergy status to narcotic agent status: Secondary | ICD-10-CM

## 2018-05-31 DIAGNOSIS — Z833 Family history of diabetes mellitus: Secondary | ICD-10-CM | POA: Insufficient documentation

## 2018-05-31 DIAGNOSIS — M542 Cervicalgia: Secondary | ICD-10-CM | POA: Insufficient documentation

## 2018-05-31 DIAGNOSIS — M1990 Unspecified osteoarthritis, unspecified site: Secondary | ICD-10-CM

## 2018-05-31 DIAGNOSIS — Z7952 Long term (current) use of systemic steroids: Secondary | ICD-10-CM

## 2018-05-31 DIAGNOSIS — M7122 Synovial cyst of popliteal space [Baker], left knee: Secondary | ICD-10-CM

## 2018-05-31 DIAGNOSIS — Z888 Allergy status to other drugs, medicaments and biological substances status: Secondary | ICD-10-CM

## 2018-05-31 DIAGNOSIS — M47816 Spondylosis without myelopathy or radiculopathy, lumbar region: Secondary | ICD-10-CM | POA: Insufficient documentation

## 2018-05-31 DIAGNOSIS — Z79899 Other long term (current) drug therapy: Secondary | ICD-10-CM | POA: Insufficient documentation

## 2018-05-31 DIAGNOSIS — Z823 Family history of stroke: Secondary | ICD-10-CM | POA: Insufficient documentation

## 2018-05-31 DIAGNOSIS — R32 Unspecified urinary incontinence: Secondary | ICD-10-CM

## 2018-05-31 DIAGNOSIS — Z79891 Long term (current) use of opiate analgesic: Secondary | ICD-10-CM

## 2018-05-31 DIAGNOSIS — Z87891 Personal history of nicotine dependence: Secondary | ICD-10-CM | POA: Insufficient documentation

## 2018-05-31 DIAGNOSIS — S32010S Wedge compression fracture of first lumbar vertebra, sequela: Secondary | ICD-10-CM

## 2018-05-31 DIAGNOSIS — M5136 Other intervertebral disc degeneration, lumbar region: Secondary | ICD-10-CM

## 2018-05-31 DIAGNOSIS — E7800 Pure hypercholesterolemia, unspecified: Secondary | ICD-10-CM

## 2018-05-31 DIAGNOSIS — I10 Essential (primary) hypertension: Secondary | ICD-10-CM

## 2018-05-31 MED ORDER — TRAMADOL HCL 50 MG PO TABS: 50.0000 mg | ORAL_TABLET | Freq: Four times a day (QID) | ORAL | 5 refills | Status: DC | PRN

## 2018-05-31 NOTE — Progress Notes (Signed)
Patient's Name: Denise Barry  MRN: [id number]  Referring Provider: Einar Pheasant, MD  DOB: Aug 31, 1934  PCP: Einar Pheasant, MD  DOS: 05/31/2018  Note by: Vevelyn Francois NP  Service setting: Ambulatory outpatient  Specialty: Interventional Pain Management  Location: [facility] (AMB) Pain Management Facility    Patient type: Established    Primary Reason(s) for Visit: Encounter for prescription drug management. (Level of risk: moderate)  CC: Back Pain (low)  HPI  Ms. Melgarejo is a 84 y.o. year old, female patient, who comes today for a medication management evaluation. 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); Urinary incontinence; History of hip fracture (Left); Health care maintenance; Hyponatremia; History of shoulder fracture (Left); Syncope; Chronic low back pain (Primary Area of Pain)  (Bilateral) (L>R); Chronic pain syndrome; Opiate use; Other long term (current) drug therapy; Other specified health status; Other reduced mobility; Disorder of bone, unspecified; Acquired spondylolisthesis; Closed 2-part displaced fracture of surgical neck of left humerus; DDD (degenerative disc disease), lumbar; Spinal stenosis, unspecified region other than cervical; SUNCT (short unilateral neuralgiform headache, conjunctival inj/tear); Anemia; Old Compression fracture of L1 and L2; DDD (degenerative disc disease), thoracic; Scoliosis; Abnormal MRI, lumbar spine (07/25/2017); Chronic musculoskeletal pain; Lumbar Spondylosis; Lumbar facet syndrome (Bilateral) (L>R); Chronic sacroiliac joint pain (Bilateral) (L>R); Abdominal pain; Elevated WBC count; Granulocytosis; Diverticulitis of large intestine with perforation; Diverticulitis; Difficulty sleeping; Spondylosis without myelopathy or radiculopathy, lumbosacral region; Other specified dorsopathies, sacral and sacrococcygeal region; Lymphedema; and Chronic venous  insufficiency on their problem list. Her primarily concern today is the Back Pain (low)  Pain Assessment: Location: Lower Back Radiating: denies Onset: More than a month ago Duration: Chronic pain Quality: Sharp, Spasm Severity: 0-No pain/10 (subjective, self-reported pain score)  Note: Reported level is compatible with observation.                          Effect on ADL: "I have to rest sometimes" Timing: Intermittent Modifying factors: medications, injections BP: (!) 169/76  HR: 82  Ms. Smithhart was last scheduled for an appointment on 04/02/2018 for medication management. During today's appointment we reviewed Ms. Storlie's chronic pain status, as well as her outpatient medication regimen.  She admits that she is doing well today.  She denies any new concerns.  She denies any side effects of her current regimen.  The patient  reports that she does not use drugs. Her body mass index is 25.02 kg/m.  Further details on both, my assessment(s), as well as the proposed treatment plan, please see below.  Controlled Substance Pharmacotherapy Assessment REMS (Risk Evaluation and Mitigation Strategy)  Analgesic: Tramadol 50 mg QID MME/day: 55m/day.    TDewayne Shorter RN  05/31/2018 10:24 AM  Signed Nursing Pain Medication Assessment:  Safety precautions to be maintained throughout the outpatient stay will include: orient to surroundings, keep bed in low position, maintain call bell within reach at all times, provide assistance with transfer out of bed and ambulation.  Medication Inspection Compliance: Pill count conducted under aseptic conditions, in front of the patient. Neither the pills nor the bottle was removed from the patient's sight at any time. Once count was completed pills were immediately returned to the patient in their original bottle.  Medication: Tramadol (Ultram) Pill/Patch Count: 4 of 120 pills remain Pill/Patch Appearance: Markings consistent with prescribed  medication Bottle Appearance: Standard pharmacy container. Clearly labeled. Filled Date: 06/ 17 / 2019 Last Medication  intake:  Today   Pharmacokinetics: Liberation and absorption (onset of action): WNL Distribution (time to peak effect): WNL Metabolism and excretion (duration of action): WNL         Pharmacodynamics: Desired effects: Analgesia: Ms. Arizpe reports >50% benefit. Functional ability: Patient reports that medication allows her to accomplish basic ADLs Clinically meaningful improvement in function (CMIF): Sustained CMIF goals met Perceived effectiveness: Described as relatively effective, allowing for increase in activities of daily living (ADL) Undesirable effects: Side-effects or Adverse reactions: None reported Monitoring: [place] PMP: Online review of the past 60-monthperiod conducted. Compliant with practice rules and regulations Last UDS on record: No results found for: SUMMARY UDS interpretation: Compliant          Medication Assessment Form: Reviewed. Patient indicates being compliant with therapy Treatment compliance: Compliant Risk Assessment Profile: Aberrant behavior: See prior evaluations. None observed or detected today Comorbid factors increasing risk of overdose: See prior notes. No additional risks detected today Risk of substance use disorder (SUD): Low Opioid Risk Tool - 04/02/18 1116      Family History of Substance Abuse   Alcohol  Negative    Illegal Drugs  Negative    Rx Drugs  Negative      Personal History of Substance Abuse   Alcohol  Negative    Illegal Drugs  Negative    Rx Drugs  Negative      Psychological Disease   Psychological Disease  Negative    Depression  Negative      Total Score   Opioid Risk Tool Scoring  0    Opioid Risk Interpretation  Low Risk      ORT Scoring interpretation table:  Score <3 = Low Risk for SUD  Score between 4-7 = Moderate Risk for SUD  Score >8 = High Risk for Opioid Abuse   Risk Mitigation  Strategies:  Patient Counseling: Covered Patient-Prescriber Agreement (PPA): Present and active  Notification to other healthcare providers: Done  Pharmacologic Plan: No change in therapy, at this time.             Laboratory Chemistry  Inflammation Markers (CRP: Acute Phase) (ESR: Chronic Phase) Lab Results  Component Value Date   CRP 1.1 08/28/2017   ESRSEDRATE 5 08/28/2017   LATICACIDVEN 1.3 02/04/2015                         Rheumatology Markers No results found for: RF, ANA, LABURIC, URICUR, LYMEIGGIGMAB, LYMEABIGMQN, HLAB27                      Renal Function Markers Lab Results  Component Value Date   BUN 18 04/24/2018   CREATININE 0.73 04/24/2018   BCR 17 08/28/2017   GFRAA >60 10/06/2017   GFRNONAA 59 (L) 10/06/2017                             Hepatic Function Markers Lab Results  Component Value Date   AST 18 04/24/2018   ALT 11 04/24/2018   ALBUMIN 4.0 04/24/2018   ALKPHOS 78 04/24/2018   LIPASE 80 (H) 02/03/2015                        Electrolytes Lab Results  Component Value Date   NA 138 04/24/2018   K 4.1 04/24/2018   CL 100 04/24/2018   CALCIUM 9.5 04/24/2018  MG 2.2 10/06/2017   PHOS 4.1 10/06/2017                        Neuropathy Markers No results found for: VITAMINB12, FOLATE, HGBA1C, HIV                      Bone Pathology Markers Lab Results  Component Value Date   25OHVITD1 47 08/28/2017   25OHVITD2 <1.0 08/28/2017   25OHVITD3 47 08/28/2017                         Coagulation Parameters Lab Results  Component Value Date   INR 1.0 02/03/2015   LABPROT 13.3 02/03/2015   APTT 26.6 01/12/2014   PLT 355.0 04/24/2018                        Cardiovascular Markers Lab Results  Component Value Date   TROPONINI <0.03 02/03/2015   HGB 10.7 (L) 04/24/2018   HCT 33.2 (L) 04/24/2018                         CA Markers No results found for: CEA, CA125, LABCA2                      Note: Lab results reviewed.  Recent  Diagnostic Imaging Results  US Venous Img Lower Unilateral Left CLINICAL DATA:  Left lower extremity edema for the past 2 weeks. Evaluate for DVT.  EXAM: LEFT LOWER EXTREMITY VENOUS DOPPLER ULTRASOUND  TECHNIQUE: Gray-scale sonography with graded compression, as well as color Doppler and duplex ultrasound were performed to evaluate the lower extremity deep venous systems from the level of the common femoral vein and including the common femoral, femoral, profunda femoral, popliteal and calf veins including the posterior tibial, peroneal and gastrocnemius veins when visible. The superficial great saphenous vein was also interrogated. Spectral Doppler was utilized to evaluate flow at rest and with distal augmentation maneuvers in the common femoral, femoral and popliteal veins.  COMPARISON:  None.  FINDINGS: Contralateral Common Femoral Vein: Respiratory phasicity is normal and symmetric with the symptomatic side. No evidence of thrombus. Normal compressibility.  Common Femoral Vein: No evidence of thrombus. Normal compressibility, respiratory phasicity and response to augmentation.  Saphenofemoral Junction: No evidence of thrombus. Normal compressibility and flow on color Doppler imaging.  Profunda Femoral Vein: No evidence of thrombus. Normal compressibility and flow on color Doppler imaging.  Femoral Vein: No evidence of thrombus. Normal compressibility, respiratory phasicity and response to augmentation.  Popliteal Vein: No evidence of thrombus. Normal compressibility, respiratory phasicity and response to augmentation.  Calf Veins: No evidence of thrombus. Normal compressibility and flow on color Doppler imaging.  Superficial Great Saphenous Vein: No evidence of thrombus. Normal compressibility.  Venous Reflux:  None.  Other Findings: Note is made of an approximately 3.1 x 1.5 x 1.4 cm serpiginous anechoic fluid collection with the left popliteal fossa compatible  with a Baker cyst.  IMPRESSION: 1. No evidence of DVT within the left lower extremity. 2. Incidentally noted approximately 3.1 cm serpiginous anechoic left-sided Baker cyst.  Electronically Signed   By: Sandi Mariscal M.D.   On: 04/18/2018 17:05  Complexity Note: Imaging results reviewed. Results shared with Ms. Haubner, using [employer].  Meds   Current Outpatient Medications:  .  Acetaminophen (TYLENOL EXTRA STRENGTH PO), Take 1-2 tablets by mouth as needed. , Disp: , Rfl:  .  amLODipine (NORVASC) 10 MG tablet, Take 1 tablet (10 mg total) by mouth daily., Disp: 30 tablet, Rfl: 3 .  aspirin 81 MG tablet, Take 81 mg by mouth daily., Disp: , Rfl:  .  Eslicarbazepine Acetate 400 MG TABS, Take 1 tablet by mouth 3 (three) times daily., Disp: , Rfl:  .  estradiol (ESTRACE VAGINAL) 0.1 MG/GM vaginal cream, Apply PRN, Disp: , Rfl:  .  Fe Fum-FePoly-Vit C-Vit B3 (INTEGRA) 62.5-62.5-40-3 MG CAPS, Take one tablet daily, Disp: 30 capsule, Rfl: 2 .  fexofenadine (ALLEGRA) 180 MG tablet, Take 180 mg by mouth as needed for allergies or rhinitis., Disp: , Rfl:  .  Multiple Vitamin (MULTI-VITAMINS) TABS, Take 1 tablet by mouth. , Disp: , Rfl:  .  traMADol (ULTRAM) 50 MG tablet, Take 1 tablet (50 mg total) by mouth every 6 (six) hours as needed for severe pain., Disp: 120 tablet, Rfl: 5 .  triamcinolone cream (KENALOG) 0.1 %, Apply 1 application topically 2 (two) times daily., Disp: 30 g, Rfl: 0  ROS  Constitutional: Denies any fever or chills Gastrointestinal: No reported hemesis, hematochezia, vomiting, or acute GI distress Musculoskeletal: Denies any acute onset joint swelling, redness, loss of ROM, or weakness Neurological: No reported episodes of acute onset apraxia, aphasia, dysarthria, agnosia, amnesia, paralysis, loss of coordination, or loss of consciousness  Allergies  Ms. Springsteen is allergic to codeine; cyclobenzaprine; methocarbamol; carbamazepine; and  gabapentin.  [place]  Drug: Ms. Schnackenberg  reports that she does not use drugs. Alcohol:  reports that she does not drink alcohol. Tobacco:  reports that she has quit smoking. She has never used smokeless tobacco. Medical:  has a past medical history of Allergy, Diverticulitis, Hypercholesterolemia, Hypertension, Osteoarthritis, Urinary incontinence, and Varicose veins. Surgical: Ms. Bahena  has a past surgical history that includes Back surgery (09/16/13); sclerosis (2000); Dilation and curettage of uterus (1971); Tubal ligation (1972); and Joint replacement. Family: family history includes COPD in her son; Cancer in her son; Diabetes in her mother; Heart disease in her father and mother; Hypertension in her mother; Stroke in her father.  Constitutional Exam  General appearance: Well nourished, well developed, and well hydrated. In no apparent acute distress Vitals:   05/31/18 1016  BP: (!) 169/76  Pulse: 82  Resp: 16  Temp: 97.8 F (36.6 C)  SpO2: 100%  Weight: 155 lb (70.3 kg)  Height: _0  (1.676 m)   BMI Assessment: Estimated body mass index is 25.02 kg/m as calculated from the following:   Height as of this encounter: _1  (1.676 m).   Weight as of this encounter: 155 lb (70.3 kg).  BMI interpretation table: BMI level Category Range association with higher incidence of chronic pain  <18 kg/m2 Underweight   18.5-24.9 kg/m2 Ideal body weight   25-29.9 kg/m2 Overweight Increased incidence by 20%  30-34.9 kg/m2 Obese (Class I) Increased incidence by 68%  35-39.9 kg/m2 Severe obesity (Class II) Increased incidence by 136%  >40 kg/m2 Extreme obesity (Class III) Increased incidence by 254%   Patient's current BMI Ideal Body weight  Body mass index is 25.02 kg/m. Ideal body weight: 59.3 kg (130 lb 11.7 oz) Adjusted ideal body weight: 63.7 kg (140 lb 7 oz)   BMI Readings from Last 4 Encounters:  05/31/18 25.02 kg/m  05/17/18 24.01 kg/m  04/24/18 24.91 kg/m  04/18/18  24.75 kg/m   Wt Readings from Last 4 Encounters:  05/31/18 155 lb (70.3 kg)  05/17/18 151 lb (68.5 kg)  04/24/18 152 lb (68.9 kg)  04/18/18 151 lb (68.5 kg)  Psych/Mental status: Alert, oriented x 3 (person, place, & time)       Eyes: PERLA Respiratory: No evidence of acute respiratory distress    Lumbar Spine Area Exam  Skin & Axial Inspection: No masses, redness, or swelling Alignment: Symmetrical Functional ROM: Unrestricted ROM       Stability: No instability detected Muscle Tone/Strength: Functionally intact. No obvious neuro-muscular anomalies detected. Sensory (Neurological): Unimpaired Palpation: No palpable anomalies       Provocative Tests: Lumbar Hyperextension/rotation test: deferred today       Lumbar quadrant test (Kemp's test): deferred today       Lumbar Lateral bending test: deferred today       Patrick's Maneuver: deferred today                   FABER test: deferred today                   Thigh-thrust test: deferred today       S-I compression test: deferred today       S-I distraction test: deferred today        Gait & Posture Assessment  Ambulation: Patient ambulates using a walker Gait: Limited. Using assistive device to ambulate Posture: WNL   Lower Extremity Exam    Side: Right lower extremity  Side: Left lower extremity  Stability: No instability observed          Stability: No instability observed          Skin & Extremity Inspection: Skin color, temperature, and hair growth are WNL. No peripheral edema or cyanosis. No masses, redness, swelling, asymmetry, or associated skin lesions. No contractures.  Skin & Extremity Inspection: Skin color, temperature, and hair growth are WNL. No peripheral edema or cyanosis. No masses, redness, swelling, asymmetry, or associated skin lesions. No contractures.  Functional ROM: Unrestricted ROM                  Functional ROM: Unrestricted ROM                  Muscle Tone/Strength: Functionally intact. No  obvious neuro-muscular anomalies detected.  Muscle Tone/Strength: Functionally intact. No obvious neuro-muscular anomalies detected.  Sensory (Neurological): Unimpaired  Sensory (Neurological): Unimpaired  Palpation: No palpable anomalies  Palpation: No palpable anomalies   Assessment  Primary Diagnosis & Pertinent Problem List: The primary encounter diagnosis was Lumbar Spondylosis. Diagnoses of DDD (degenerative disc disease), lumbar, Old Compression fracture of L1 and L2, and Chronic pain syndrome were also pertinent to this visit.  Status Diagnosis  Controlled Controlled Controlled 1. Lumbar Spondylosis   2. DDD (degenerative disc disease), lumbar   3. Old Compression fracture of L1 and L2   4. Chronic pain syndrome     Problems updated and reviewed during this visit: No problems updated. Plan of Care  Pharmacotherapy (Medications Ordered): Meds ordered this encounter  Medications  . traMADol (ULTRAM) 50 MG tablet    Sig: Take 1 tablet (50 mg total) by mouth every 6 (six) hours as needed for severe pain.    Dispense:  120 tablet    Refill:  5    Do not place this medication, or any other prescription from our practice, on "Automatic Refill". Patient may have prescription filled one day  early if pharmacy is closed on scheduled refill date. Do not fill until: 05/31/2018 To last until: 11/27/2018    Order Specific Question:   Supervising Provider    Answer:   Milinda Pointer [phone]   New Prescriptions   No medications on file   Medications administered today: Kinleigh Nault. Kluesner had no medications administered during this visit. Lab-work, procedure(s), and/or referral(s): No orders of the defined types were placed in this encounter.  Imaging and/or referral(s): None  Interventional therapies: Planned, scheduled, and/or pending: Not at this time.   Considering: Diagnostic bilateral lumbar facet block Possible bilateral lumbar facet RFA Diagnostic bilateral  sacroiliac joint block Possible bilateral sacroiliac joint RFA Diagnostic caudal epidural steroid injection plus diagnostic epidurogram Possible Racz procedure Diagnostic left sided L2-3 interlaminar lumbar epidural steroid injection   PRN Procedures: None at this time   Provider-requested follow-up: Return in about 6 months (around 12/01/2018) for MedMgmt with Me Donella Stade Edison Pace).  Future Appointments  Date Time Provider [place]  06/11/2018 10:30 AM Einar Pheasant, MD [facility]  08/20/2018 10:45 AM Schnier, Dolores Lory, MD [facility] None  11/06/2018  1:00 PM Dia Crawford, LPN [facility]  04/23/7627 10:30 AM Vevelyn Francois, NP [facility] None   Primary Care Physician: Einar Pheasant, MD Location: [facility] Outpatient Pain Management Facility Note by: Vevelyn Francois NP Date: 05/31/2018; Time: 3:53 PM  Pain Score Disclaimer: We use the NRS-11 scale. This is a self-reported, subjective measurement of pain severity with only modest accuracy. It is used primarily to identify changes within a particular patient. It must be understood that outpatient pain scales are significantly less accurate that those used for research, where they can be applied under ideal controlled circumstances with minimal exposure to variables. In reality, the score is likely to be a combination of pain intensity and pain affect, where pain affect describes the degree of emotional arousal or changes in action readiness caused by the sensory experience of pain. Factors such as social and work situation, setting, emotional state, anxiety levels, expectation, and prior pain experience may influence pain perception and show large inter-individual differences that may also be affected by time variables.  Patient instructions provided during this appointment: Patient Instructions  You have been given Rx for Tramadol to last until  11/27/2018.____________________________________________________________________________________________  Medication Rules  Applies to: All patients receiving prescriptions (written or electronic).  Pharmacy of record: Pharmacy where electronic prescriptions will be sent. If written prescriptions are taken to a different pharmacy, please inform the nursing staff. The pharmacy listed in the electronic medical record should be the one where you would like electronic prescriptions to be sent.  Prescription refills: Only during scheduled appointments. Applies to both, written and electronic prescriptions.  NOTE: The following applies primarily to controlled substances (Opioid* Pain Medications).   Patient's responsibilities: 1. Pain Pills: Bring all pain pills to every appointment (except for procedure appointments). 2. Pill Bottles: Bring pills in original pharmacy bottle. Always bring newest bottle. Bring bottle, even if empty. 3. Medication refills: You are responsible for knowing and keeping track of what medications you need refilled. The day before your appointment, write a list of all prescriptions that need to be refilled. Bring that list to your appointment and give it to the admitting nurse. Prescriptions will be written only during appointments. If you forget a medication, it will not be "Called in", "Faxed", or "electronically sent". You will need to get another appointment to get these prescribed. 4. Prescription Accuracy: You are responsible for  carefully inspecting your prescriptions before leaving our office. Have the discharge nurse carefully go over each prescription with you, before taking them home. Make sure that your name is accurately spelled, that your address is correct. Check the name and dose of your medication to make sure it is accurate. Check the number of pills, and the written instructions to make sure they are clear and accurate. Make sure that you are given enough  medication to last until your next medication refill appointment. 5. Taking Medication: Take medication as prescribed. Never take more pills than instructed. Never take medication more frequently than prescribed. Taking less pills or less frequently is permitted and encouraged, when it comes to controlled substances (written prescriptions).  6. Inform other Doctors: Always inform, all of your healthcare providers, of all the medications you take. 7. Pain Medication from other Providers: You are not allowed to accept any additional pain medication from any other Doctor or Healthcare provider. There are two exceptions to this rule. (see below) In the event that you require additional pain medication, you are responsible for notifying us, as stated below. 8. Medication Agreement: You are responsible for carefully reading and following our Medication Agreement. This must be signed before receiving any prescriptions from our practice. Safely store a copy of your signed Agreement. Violations to the Agreement will result in no further prescriptions. (Additional copies of our Medication Agreement are available upon request.) 9. Laws, Rules, & Regulations: All patients are expected to follow all Federal and [employer], [employer], Rules, [employer]. Ignorance of the Laws does not constitute a valid excuse. The use of any illegal substances is prohibited. 10. Adopted CDC guidelines & recommendations: Target dosing levels will be at or below 60 MME/day. Use of benzodiazepines** is not recommended.  Exceptions: There are only two exceptions to the rule of not receiving pain medications from other Healthcare Providers. 1. Exception #1 (Emergencies): In the event of an emergency (i.e.: accident requiring emergency care), you are allowed to receive additional pain medication. However, you are responsible for: As soon as you are able, call our office [phone], at any time of the day or night, and leave a message  stating your name, the date and nature of the emergency, and the name and dose of the medication prescribed. In the event that your call is answered by a member of our staff, make sure to document and save the date, time, and the name of the person that took your information.  2. Exception #2 (Planned Surgery): In the event that you are scheduled by another doctor or dentist to have any type of surgery or procedure, you are allowed (for a period no longer than 30 days), to receive additional pain medication, for the acute post-op pain. However, in this case, you are responsible for picking up a copy of our "Post-op Pain Management for Surgeons" handout, and giving it to your surgeon or dentist. This document is available at our office, and does not require an appointment to obtain it. Simply go to our office during business hours (Monday-Thursday from 8:00 AM to 4:00 PM) (Friday 8:00 AM to 12:00 Noon) or if you have a scheduled appointment with [place], prior to your surgery, and ask for it by name. In addition, you will need to provide [place] with your name, name of your surgeon, type of surgery, and date of procedure or surgery.  *Opioid medications include: morphine, codeine, oxycodone, oxymorphone, hydrocodone, hydromorphone, meperidine, tramadol, tapentadol, buprenorphine, fentanyl, methadone. **Benzodiazepine medications include:  diazepam (Valium), alprazolam (Xanax), clonazepam (Klonopine), lorazepam (Ativan), clorazepate (Tranxene), chlordiazepoxide (Librium), estazolam (Prosom), oxazepam (Serax), temazepam (Restoril), triazolam (Halcion) (Last updated: 12/28/2017) ____________________________________________________________________________________________   Can call for procedures as needed.

## 2018-05-31 NOTE — Progress Notes (Signed)
Nursing Pain Medication Assessment:  Safety precautions to be maintained throughout the outpatient stay will include: orient to surroundings, keep bed in low position, maintain call bell within reach at all times, provide assistance with transfer out of bed and ambulation.  Medication Inspection Compliance: Pill count conducted under aseptic conditions, in front of the patient. Neither the pills nor the bottle was removed from the patient's sight at any time. Once count was completed pills were immediately returned to the patient in their original bottle.  Medication: Tramadol (Ultram) Pill/Patch Count: 4 of 120 pills remain Pill/Patch Appearance: Markings consistent with prescribed medication Bottle Appearance: Standard pharmacy container. Clearly labeled. Filled Date: 06/ 17 / 2019 Last Medication intake:  Today

## 2018-05-31 NOTE — Patient Instructions (Addendum)
You have been given Rx for Tramadol to last until 11/27/2018.____________________________________________________________________________________________  Medication Rules  Applies to: All patients receiving prescriptions (written or electronic).  Pharmacy of record: Pharmacy where electronic prescriptions will be sent. If written prescriptions are taken to a different pharmacy, please inform the nursing staff. The pharmacy listed in the electronic medical record should be the one where you would like electronic prescriptions to be sent.  Prescription refills: Only during scheduled appointments. Applies to both, written and electronic prescriptions.  NOTE: The following applies primarily to controlled substances (Opioid* Pain Medications).   Patient's responsibilities: 1. Pain Pills: Bring all pain pills to every appointment (except for procedure appointments). 2. Pill Bottles: Bring pills in original pharmacy bottle. Always bring newest bottle. Bring bottle, even if empty. 3. Medication refills: You are responsible for knowing and keeping track of what medications you need refilled. The day before your appointment, write a list of all prescriptions that need to be refilled. Bring that list to your appointment and give it to the admitting nurse. Prescriptions will be written only during appointments. If you forget a medication, it will not be "Called in", "Faxed", or "electronically sent". You will need to get another appointment to get these prescribed. 4. Prescription Accuracy: You are responsible for carefully inspecting your prescriptions before leaving our office. Have the discharge nurse carefully go over each prescription with you, before taking them home. Make sure that your name is accurately spelled, that your address is correct. Check the name and dose of your medication to make sure it is accurate. Check the number of pills, and the written instructions to make sure they are clear and  accurate. Make sure that you are given enough medication to last until your next medication refill appointment. 5. Taking Medication: Take medication as prescribed. Never take more pills than instructed. Never take medication more frequently than prescribed. Taking less pills or less frequently is permitted and encouraged, when it comes to controlled substances (written prescriptions).  6. Inform other Doctors: Always inform, all of your healthcare providers, of all the medications you take. 7. Pain Medication from other Providers: You are not allowed to accept any additional pain medication from any other Doctor or Healthcare provider. There are two exceptions to this rule. (see below) In the event that you require additional pain medication, you are responsible for notifying us, as stated below. 8. Medication Agreement: You are responsible for carefully reading and following our Medication Agreement. This must be signed before receiving any prescriptions from our practice. Safely store a copy of your signed Agreement. Violations to the Agreement will result in no further prescriptions. (Additional copies of our Medication Agreement are available upon request.) 9. Laws, Rules, & Regulations: All patients are expected to follow all Federal and [employer], [employer], Rules, [employer]. Ignorance of the Laws does not constitute a valid excuse. The use of any illegal substances is prohibited. 10. Adopted CDC guidelines & recommendations: Target dosing levels will be at or below 60 MME/day. Use of benzodiazepines** is not recommended.  Exceptions: There are only two exceptions to the rule of not receiving pain medications from other Healthcare Providers. 1. Exception #1 (Emergencies): In the event of an emergency (i.e.: accident requiring emergency care), you are allowed to receive additional pain medication. However, you are responsible for: As soon as you are able, call our office [phone], at any  time of the day or night, and leave a message stating your name, the date and nature of  the emergency, and the name and dose of the medication prescribed. In the event that your call is answered by a member of our staff, make sure to document and save the date, time, and the name of the person that took your information.  2. Exception #2 (Planned Surgery): In the event that you are scheduled by another doctor or dentist to have any type of surgery or procedure, you are allowed (for a period no longer than 30 days), to receive additional pain medication, for the acute post-op pain. However, in this case, you are responsible for picking up a copy of our "Post-op Pain Management for Surgeons" handout, and giving it to your surgeon or dentist. This document is available at our office, and does not require an appointment to obtain it. Simply go to our office during business hours (Monday-Thursday from 8:00 AM to 4:00 PM) (Friday 8:00 AM to 12:00 Noon) or if you have a scheduled appointment with [place], prior to your surgery, and ask for it by name. In addition, you will need to provide [place] with your name, name of your surgeon, type of surgery, and date of procedure or surgery.  *Opioid medications include: morphine, codeine, oxycodone, oxymorphone, hydrocodone, hydromorphone, meperidine, tramadol, tapentadol, buprenorphine, fentanyl, methadone. **Benzodiazepine medications include: diazepam (Valium), alprazolam (Xanax), clonazepam (Klonopine), lorazepam (Ativan), clorazepate (Tranxene), chlordiazepoxide (Librium), estazolam (Prosom), oxazepam (Serax), temazepam (Restoril), triazolam (Halcion) (Last updated: 12/28/2017) ____________________________________________________________________________________________   Can call for procedures as needed.

## 2018-06-03 ENCOUNTER — Encounter: Payer: Self-pay | Admitting: Emergency Medicine

## 2018-06-03 ENCOUNTER — Emergency Department: Payer: Medicare Other

## 2018-06-03 ENCOUNTER — Inpatient Hospital Stay
Admission: EM | Admit: 2018-06-03 | Discharge: 2018-06-06 | DRG: 470 | Disposition: A | Discharge status: Skilled Nursing Facility | Payer: Medicare Other | Attending: Internal Medicine | Admitting: Internal Medicine

## 2018-06-03 ENCOUNTER — Other Ambulatory Visit: Payer: Self-pay

## 2018-06-03 DIAGNOSIS — Z882 Allergy status to sulfonamides status: Secondary | ICD-10-CM | POA: Diagnosis not present

## 2018-06-03 DIAGNOSIS — I8390 Asymptomatic varicose veins of unspecified lower extremity: Secondary | ICD-10-CM | POA: Diagnosis present

## 2018-06-03 DIAGNOSIS — Z96642 Presence of left artificial hip joint: Secondary | ICD-10-CM | POA: Diagnosis present

## 2018-06-03 DIAGNOSIS — Z7982 Long term (current) use of aspirin: Secondary | ICD-10-CM

## 2018-06-03 DIAGNOSIS — Z66 Do not resuscitate: Secondary | ICD-10-CM | POA: Diagnosis present

## 2018-06-03 DIAGNOSIS — S72001A Fracture of unspecified part of neck of right femur, initial encounter for closed fracture: Secondary | ICD-10-CM | POA: Diagnosis not present

## 2018-06-03 DIAGNOSIS — M810 Age-related osteoporosis without current pathological fracture: Secondary | ICD-10-CM | POA: Diagnosis not present

## 2018-06-03 DIAGNOSIS — S72011A Unspecified intracapsular fracture of right femur, initial encounter for closed fracture: Secondary | ICD-10-CM | POA: Diagnosis not present

## 2018-06-03 DIAGNOSIS — Z7401 Bed confinement status: Secondary | ICD-10-CM | POA: Diagnosis not present

## 2018-06-03 DIAGNOSIS — Z87891 Personal history of nicotine dependence: Secondary | ICD-10-CM | POA: Diagnosis not present

## 2018-06-03 DIAGNOSIS — I739 Peripheral vascular disease, unspecified: Secondary | ICD-10-CM | POA: Diagnosis present

## 2018-06-03 DIAGNOSIS — S72031A Displaced midcervical fracture of right femur, initial encounter for closed fracture: Secondary | ICD-10-CM | POA: Diagnosis not present

## 2018-06-03 DIAGNOSIS — Z885 Allergy status to narcotic agent status: Secondary | ICD-10-CM

## 2018-06-03 DIAGNOSIS — S72041A Displaced fracture of base of neck of right femur, initial encounter for closed fracture: Secondary | ICD-10-CM | POA: Diagnosis not present

## 2018-06-03 DIAGNOSIS — Z888 Allergy status to other drugs, medicaments and biological substances status: Secondary | ICD-10-CM

## 2018-06-03 DIAGNOSIS — Z4789 Encounter for other orthopedic aftercare: Secondary | ICD-10-CM | POA: Diagnosis not present

## 2018-06-03 DIAGNOSIS — W07XXXA Fall from chair, initial encounter: Secondary | ICD-10-CM | POA: Diagnosis present

## 2018-06-03 DIAGNOSIS — Z96641 Presence of right artificial hip joint: Secondary | ICD-10-CM | POA: Diagnosis not present

## 2018-06-03 DIAGNOSIS — Z96649 Presence of unspecified artificial hip joint: Secondary | ICD-10-CM

## 2018-06-03 DIAGNOSIS — Y92009 Unspecified place in unspecified non-institutional (private) residence as the place of occurrence of the external cause: Secondary | ICD-10-CM | POA: Diagnosis not present

## 2018-06-03 DIAGNOSIS — S72009A Fracture of unspecified part of neck of unspecified femur, initial encounter for closed fracture: Secondary | ICD-10-CM | POA: Diagnosis present

## 2018-06-03 DIAGNOSIS — R262 Difficulty in walking, not elsewhere classified: Secondary | ICD-10-CM | POA: Diagnosis not present

## 2018-06-03 DIAGNOSIS — D509 Iron deficiency anemia, unspecified: Secondary | ICD-10-CM | POA: Diagnosis present

## 2018-06-03 DIAGNOSIS — Z01818 Encounter for other preprocedural examination: Secondary | ICD-10-CM | POA: Diagnosis not present

## 2018-06-03 DIAGNOSIS — G894 Chronic pain syndrome: Secondary | ICD-10-CM

## 2018-06-03 DIAGNOSIS — I872 Venous insufficiency (chronic) (peripheral): Secondary | ICD-10-CM | POA: Diagnosis not present

## 2018-06-03 DIAGNOSIS — Z79899 Other long term (current) drug therapy: Secondary | ICD-10-CM

## 2018-06-03 DIAGNOSIS — Z471 Aftercare following joint replacement surgery: Secondary | ICD-10-CM | POA: Diagnosis not present

## 2018-06-03 DIAGNOSIS — Z79818 Long term (current) use of other agents affecting estrogen receptors and estrogen levels: Secondary | ICD-10-CM | POA: Diagnosis not present

## 2018-06-03 DIAGNOSIS — G8929 Other chronic pain: Secondary | ICD-10-CM | POA: Diagnosis present

## 2018-06-03 DIAGNOSIS — E876 Hypokalemia: Secondary | ICD-10-CM | POA: Diagnosis present

## 2018-06-03 DIAGNOSIS — G500 Trigeminal neuralgia: Secondary | ICD-10-CM | POA: Diagnosis present

## 2018-06-03 DIAGNOSIS — I890 Lymphedema, not elsewhere classified: Secondary | ICD-10-CM | POA: Diagnosis not present

## 2018-06-03 DIAGNOSIS — R531 Weakness: Secondary | ICD-10-CM | POA: Diagnosis present

## 2018-06-03 DIAGNOSIS — M1990 Unspecified osteoarthritis, unspecified site: Secondary | ICD-10-CM | POA: Diagnosis present

## 2018-06-03 DIAGNOSIS — R296 Repeated falls: Secondary | ICD-10-CM | POA: Diagnosis not present

## 2018-06-03 DIAGNOSIS — M6281 Muscle weakness (generalized): Secondary | ICD-10-CM | POA: Diagnosis not present

## 2018-06-03 DIAGNOSIS — E7800 Pure hypercholesterolemia, unspecified: Secondary | ICD-10-CM | POA: Diagnosis present

## 2018-06-03 DIAGNOSIS — S72001D Fracture of unspecified part of neck of right femur, subsequent encounter for closed fracture with routine healing: Secondary | ICD-10-CM | POA: Diagnosis not present

## 2018-06-03 DIAGNOSIS — I10 Essential (primary) hypertension: Secondary | ICD-10-CM | POA: Diagnosis present

## 2018-06-03 LAB — BASIC METABOLIC PANEL
Anion gap: 10 (ref 5–15)
BUN: 18 mg/dL (ref 8–23)
CO2: 27 mmol/L (ref 22–32)
CREATININE: 0.78 mg/dL (ref 0.44–1.00)
Calcium: 8.9 mg/dL (ref 8.9–10.3)
Chloride: 101 mmol/L (ref 98–111)
GFR calc Af Amer: >60 mL/min (ref >60)
Glucose, Bld: 144 mg/dL — ABNORMAL HIGH (ref 70–99)
Potassium: 3.3 mmol/L — ABNORMAL LOW (ref 3.5–5.1)
SODIUM: 138 mmol/L (ref 135–145)

## 2018-06-03 LAB — URINALYSIS, COMPLETE (UACMP) WITH MICROSCOPIC
BILIRUBIN URINE: NEGATIVE
Glucose, UA: NEGATIVE mg/dL
KETONES UR: 5 mg/dL — AB
NITRITE: NEGATIVE
Protein, ur: NEGATIVE mg/dL
SPECIFIC GRAVITY, URINE: 1.006 (ref 1.005–1.030)
pH: 8 (ref 5.0–8.0)

## 2018-06-03 LAB — CBC WITH DIFFERENTIAL/PLATELET
Basophils Absolute: 0.1 10*3/uL (ref 0–0.1)
Basophils Relative: 0 %
EOS ABS: 0 10*3/uL (ref 0–0.7)
EOS PCT: 0 %
HCT: 34.6 % — ABNORMAL LOW (ref 35.0–47.0)
Hemoglobin: 11.3 g/dL — ABNORMAL LOW (ref 12.0–16.0)
LYMPHS ABS: 0.6 10*3/uL — AB (ref 1.0–3.6)
Lymphocytes Relative: 4 %
MCH: 27.1 pg (ref 26.0–34.0)
MCHC: 32.6 g/dL (ref 32.0–36.0)
MCV: 83.1 fL (ref 80.0–100.0)
MONOS PCT: 6 %
Monocytes Absolute: 0.9 10*3/uL (ref 0.2–0.9)
NEUTROS ABS: 12.8 10*3/uL — AB (ref 1.4–6.5)
Neutrophils Relative %: 90 %
PLATELETS: 322 10*3/uL (ref 150–440)
RBC: 4.17 MIL/uL (ref 3.80–5.20)
RDW: 20 % — AB (ref 11.5–14.5)
WBC: 14.3 10*3/uL — ABNORMAL HIGH (ref 3.6–11.0)

## 2018-06-03 LAB — PROTIME-INR
INR: 1.05
PROTHROMBIN TIME: 13.6 s (ref 11.4–15.2)

## 2018-06-03 LAB — SURGICAL PCR SCREEN
MRSA, PCR: NEGATIVE
STAPHYLOCOCCUS AUREUS: POSITIVE — AB

## 2018-06-03 MED ORDER — ASPIRIN EC 81 MG PO TBEC: 81.0000 mg | DELAYED_RELEASE_TABLET | Freq: Every day | ORAL | Status: DC

## 2018-06-03 MED ORDER — DOCUSATE SODIUM 100 MG PO CAPS
100.0000 mg | ORAL_CAPSULE | Freq: Every day | ORAL | Status: DC
  Filled 2018-06-03: qty 1

## 2018-06-03 MED ORDER — SODIUM CHLORIDE 0.9 % IV SOLN
Freq: Once | INTRAVENOUS | Status: AC
  Administered 2018-06-03: 17:00:00 via INTRAVENOUS

## 2018-06-03 MED ORDER — POTASSIUM CHLORIDE CRYS ER 20 MEQ PO TBCR
40.0000 meq | EXTENDED_RELEASE_TABLET | Freq: Two times a day (BID) | ORAL | Status: DC
  Administered 2018-06-03 – 2018-06-05 (×4): 40 meq via ORAL
  Filled 2018-06-03 (×4): qty 2

## 2018-06-03 MED ORDER — ACETAMINOPHEN 325 MG PO TABS: 650.0000 mg | ORAL_TABLET | Freq: Four times a day (QID) | ORAL | Status: DC | PRN

## 2018-06-03 MED ORDER — SODIUM CHLORIDE 0.9 % IV SOLN: INTRAVENOUS | Status: DC

## 2018-06-03 MED ORDER — AMLODIPINE BESYLATE 10 MG PO TABS
10.0000 mg | ORAL_TABLET | Freq: Every day | ORAL | Status: DC
  Administered 2018-06-04 – 2018-06-06 (×2): 10 mg via ORAL
  Filled 2018-06-03 (×3): qty 1

## 2018-06-03 MED ORDER — POTASSIUM CHLORIDE CRYS ER 20 MEQ PO TBCR: 20.0000 meq | EXTENDED_RELEASE_TABLET | Freq: Two times a day (BID) | ORAL | Status: DC

## 2018-06-03 MED ORDER — OXYCODONE-ACETAMINOPHEN 5-325 MG PO TABS: 1.0000 | ORAL_TABLET | Freq: Four times a day (QID) | ORAL | Status: DC | PRN

## 2018-06-03 MED ORDER — TRAMADOL HCL 50 MG PO TABS
50.0000 mg | ORAL_TABLET | Freq: Four times a day (QID) | ORAL | Status: DC | PRN
  Administered 2018-06-03 – 2018-06-06 (×9): 50 mg via ORAL
  Filled 2018-06-03 (×9): qty 1

## 2018-06-03 MED ORDER — MORPHINE SULFATE (PF) 2 MG/ML IV SOLN: 2.0000 mg | INTRAVENOUS | Status: DC | PRN

## 2018-06-03 MED ORDER — DOCUSATE SODIUM 100 MG PO CAPS: 100.0000 mg | ORAL_CAPSULE | Freq: Two times a day (BID) | ORAL | Status: DC | PRN

## 2018-06-03 MED ORDER — CEFAZOLIN SODIUM-DEXTROSE 1-4 GM/50ML-% IV SOLN
1.0000 g | INTRAVENOUS | Status: AC
  Administered 2018-06-04: 1 g via INTRAVENOUS
  Filled 2018-06-03: qty 50

## 2018-06-03 MED ORDER — ESLICARBAZEPINE ACETATE 400 MG PO TABS
1.0000 | ORAL_TABLET | Freq: Three times a day (TID) | ORAL | Status: DC
  Administered 2018-06-04 – 2018-06-06 (×8): 400 mg via ORAL
  Filled 2018-06-03 (×5): qty 1

## 2018-06-03 MED ORDER — FE FUMARATE-B12-VIT C-FA-IFC PO CAPS
1.0000 | ORAL_CAPSULE | Freq: Every day | ORAL | Status: DC
  Administered 2018-06-05 – 2018-06-06 (×2): 1 via ORAL
  Filled 2018-06-03 (×3): qty 1

## 2018-06-03 MED ORDER — MORPHINE SULFATE (PF) 2 MG/ML IV SOLN: 1.0000 mg | INTRAVENOUS | Status: DC | PRN

## 2018-06-03 MED ORDER — CLINDAMYCIN PHOSPHATE 600 MG/50ML IV SOLN
600.0000 mg | INTRAVENOUS | Status: AC
  Administered 2018-06-04: 600 mg via INTRAVENOUS
  Filled 2018-06-03: qty 50

## 2018-06-03 MED ORDER — FENTANYL CITRATE (PF) 100 MCG/2ML IJ SOLN
50.0000 ug | Freq: Once | INTRAMUSCULAR | Status: AC
  Administered 2018-06-03: 50 ug via INTRAVENOUS
  Filled 2018-06-03: qty 2

## 2018-06-03 MED ORDER — HYDROCODONE-ACETAMINOPHEN 5-325 MG PO TABS
1.0000 | ORAL_TABLET | ORAL | Status: DC | PRN
  Administered 2018-06-05: 1 via ORAL
  Filled 2018-06-03: qty 1

## 2018-06-03 MED ORDER — TRIAMCINOLONE ACETONIDE 0.1 % EX CREA
1.0000 "application " | TOPICAL_CREAM | Freq: Two times a day (BID) | CUTANEOUS | Status: DC | PRN
  Filled 2018-06-03: qty 15

## 2018-06-03 MED ORDER — HEPARIN SODIUM (PORCINE) 5000 UNIT/ML IJ SOLN
5000.0000 [IU] | Freq: Three times a day (TID) | INTRAMUSCULAR | Status: AC
  Administered 2018-06-03: 5000 [IU] via SUBCUTANEOUS
  Filled 2018-06-03: qty 1

## 2018-06-03 MED ORDER — ADULT MULTIVITAMIN W/MINERALS CH
1.0000 | ORAL_TABLET | Freq: Every day | ORAL | Status: DC
  Administered 2018-06-04 – 2018-06-05 (×2): 1 via ORAL
  Filled 2018-06-03 (×2): qty 1

## 2018-06-03 MED ORDER — SODIUM CHLORIDE 0.9 % IV SOLN
INTRAVENOUS | Status: DC
  Administered 2018-06-03 – 2018-06-04 (×2): via INTRAVENOUS

## 2018-06-03 NOTE — ED Provider Notes (Signed)
[facility] Emergency Department Provider Note  ____________________________________________   I have reviewed the triage vital signs and the nursing notes.   HISTORY  Chief Complaint Right leg weakness  History limited by: Not Limited   HPI Denise Barry is a 84 y.o. female who presents to the emergency department today because of concerns for pain and weakness to her right leg.  The patient states that she had a fall yesterday.  She was sitting in a chair when she fell out fell onto her right hip.  She did not hit her head.  She did not have any significant pain or discomfort with that leg.  Today however after riding in the car she was unable to get out of the car.  Is at this point that she did for started developing the pain.  She denies any trauma to her leg today.    Per medical record review patient has a history of left hip replacement  Past Medical History:  Diagnosis Date  . Allergy   . Diverticulitis    11/18  . Hypercholesterolemia   . Hypertension   . Osteoarthritis   . Urinary incontinence    pessary in place  . Varicose veins    H/O    Patient Active Problem List   Diagnosis Date Noted  . Chronic venous insufficiency 05/20/2018  . Lymphedema 04/24/2018  . Spondylosis without myelopathy or radiculopathy, lumbosacral region 12/21/2017  . Other specified dorsopathies, sacral and sacrococcygeal region 12/21/2017  . Difficulty sleeping 10/22/2017  . Diverticulitis   . Abdominal pain 10/05/2017  . Elevated WBC count 10/05/2017  . Granulocytosis 10/05/2017  . Diverticulitis of large intestine with perforation 10/05/2017  . Chronic musculoskeletal pain 09/13/2017  . Lumbar Spondylosis 09/13/2017  . Lumbar facet syndrome (Bilateral) (L>R) 09/13/2017  . Chronic sacroiliac joint pain (Bilateral) (L>R) 09/13/2017  . Old Compression fracture of L1 and L2 09/12/2017  . DDD (degenerative disc disease), thoracic 09/12/2017  .  Scoliosis 09/12/2017  . Abnormal MRI, lumbar spine (07/25/2017) 09/12/2017  . Chronic low back pain (Primary Area of Pain)  (Bilateral) (L>R) 08/28/2017  . Chronic pain syndrome 08/28/2017  . Opiate use 08/28/2017  . Other long term (current) drug therapy 08/28/2017  . Other specified health status 08/28/2017  . Other reduced mobility 08/28/2017  . Disorder of bone, unspecified 08/28/2017  . Syncope 07/08/2017  . Anemia 06/10/2017  . SUNCT (short unilateral neuralgiform headache, conjunctival inj/tear) 07/09/2015  . History of shoulder fracture (Left) 06/03/2015  . Hyponatremia 05/07/2015  . Closed 2-part displaced fracture of surgical neck of left humerus 02/09/2015  . Health care maintenance 01/18/2015  . History of hip fracture (Left) 07/13/2014  . Chronic Trigeminal neuralgia (Right) 01/07/2014  . Essential hypertension, benign 01/07/2014  . Hypercholesterolemia 01/07/2014  . Failed back surgical syndrome (L4-5 fusion) 01/07/2014  . Urinary incontinence 01/07/2014  . Acquired spondylolisthesis 09/13/2013  . Spinal stenosis, unspecified region other than cervical 09/13/2013  . DDD (degenerative disc disease), lumbar 06/05/2013    Past Surgical History:  Procedure Laterality Date  . BACK SURGERY  09/16/13  . DILATION AND CURETTAGE OF UTERUS  1971  . JOINT REPLACEMENT    . sclerosis  2000  . TUBAL LIGATION  1972    Prior to Admission medications   Medication Sig Start Date End Date Taking? Authorizing Provider  Acetaminophen (TYLENOL EXTRA STRENGTH PO) Take 1-2 tablets by mouth as needed.     [provider]  amLODipine (NORVASC) 10 MG tablet  Take 1 tablet (10 mg total) by mouth daily. 11/24/17   Einar Pheasant, MD  aspirin 81 MG tablet Take 81 mg by mouth daily.    [provider]  Eslicarbazepine Acetate 400 MG TABS Take 1 tablet by mouth 3 (three) times daily.    [provider]  estradiol (ESTRACE VAGINAL) 0.1 MG/GM vaginal cream Apply PRN  05/22/16   [provider]  Fe Fum-FePoly-Vit C-Vit B3 (INTEGRA) 62.5-62.5-40-3 MG CAPS Take one tablet daily 02/26/18   Einar Pheasant, MD  fexofenadine (ALLEGRA) 180 MG tablet Take 180 mg by mouth as needed for allergies or rhinitis.    [provider]  Multiple Vitamin (MULTI-VITAMINS) TABS Take 1 tablet by mouth.     [provider]  traMADol (ULTRAM) 50 MG tablet Take 1 tablet (50 mg total) by mouth every 6 (six) hours as needed for severe pain. 05/31/18 11/27/18  Vevelyn Francois, NP  triamcinolone cream (KENALOG) 0.1 % Apply 1 application topically 2 (two) times daily. 04/24/18   Einar Pheasant, MD    Allergies Codeine; Cyclobenzaprine; Methocarbamol; Carbamazepine; and Gabapentin  Family History  Problem Relation Age of Onset  . Heart disease Mother   . Diabetes Mother   . Hypertension Mother   . Heart disease Father   . Stroke Father   . Cancer Son        bladder  . COPD Son   . Breast cancer Neg Hx     Social History Social History   Tobacco Use  . Smoking status: Former Research scientist (life sciences)  . Smokeless tobacco: Never Used  Substance Use Topics  . Alcohol use: No    Alcohol/week: 0.0 oz  . Drug use: No    Review of Systems Constitutional: No fever/chills Eyes: No visual changes. ENT: No sore throat. Cardiovascular: Denies chest pain. Respiratory: Denies shortness of breath. Gastrointestinal: No abdominal pain.  No nausea, no vomiting.  No diarrhea.   Genitourinary: Negative for dysuria. Musculoskeletal: Positive for right hip pain Skin: Negative for rash. Neurological: Negative for headaches, focal weakness or numbness.  ____________________________________________   PHYSICAL EXAM:  VITAL SIGNS: ED Triage Vitals [06/03/18 1624]  Enc Vitals Group     BP (!) 163/69     Pulse Rate 85     Resp 16     Temp 97.7 F (36.5 C)     Temp Source Oral     SpO2 100 %     Weight 155 lb (70.3 kg)     Height 5\' 6"  (1.676 m)     Head Circumference       Peak Flow      Pain Score 0   Constitutional: Alert and oriented.  Eyes: Conjunctivae are normal.  ENT      Head: Normocephalic and atraumatic.      Nose: No congestion/rhinnorhea.      Mouth/Throat: Mucous membranes are moist.      Neck: No stridor. Hematological/Lymphatic/Immunilogical: No cervical lymphadenopathy. Cardiovascular: Normal rate, regular rhythm.  No murmurs, rubs, or gallops.  Respiratory: Normal respiratory effort without tachypnea nor retractions. Breath sounds are clear and equal bilaterally. No wheezes/rales/rhonchi. Gastrointestinal: Soft and non tender. No rebound. No guarding.  Genitourinary: Deferred Musculoskeletal: Right hip tender to palpation manipulation right leg externally rotated and shortened Neurologic:  Normal speech and language. No gross focal neurologic deficits are appreciated.  Skin:  Skin is warm, dry and intact. No rash noted. Psychiatric: Mood and affect are normal. Speech and behavior are normal. Patient exhibits appropriate  insight and judgment.  ____________________________________________    LABS (pertinent positives/negatives)  CBC wbc 14.3, hgb 11.3, plt 322 BMP na 138, k 3.3, glu 144, cr 0.78   ____________________________________________    RADIOLOGY  Right hip Acute transcervical fracture of right femur  I, Tameem Pullara, personally viewed and evaluated these images (plain radiographs) as part of my medical decision making. ____________________________________________   PROCEDURES  Procedures  ____________________________________________   INITIAL IMPRESSION / ASSESSMENT AND PLAN / ED COURSE  Pertinent labs & imaging results that were available during my care of the patient were reviewed by me and considered in my medical decision making (see chart for details).   Patient presented to the emergency department today because of concerns for right hip pain.  X-ray does show a fracture of the right femur.   Discussed this finding with the patient.  Discussed with Dr. Sabra Heck orthopedic surgeon on call.  Also discussed with hospitalist for admission. ___________________________________   FINAL CLINICAL IMPRESSION(S) / ED DIAGNOSES  Final diagnoses:  Closed fracture of right hip, initial encounter [facility])     Note: This dictation was prepared with Dragon dictation. Any transcriptional errors that result from this process are unintentional     Nance Pear, MD 06/03/18 1818

## 2018-06-03 NOTE — ED Notes (Signed)
.   Pt is resting, Respirations even and unlabored, NAD. Stretcher lowest postion and locked. Call bell within reach. Denies any needs at this time RN will continue to monitor.    

## 2018-06-03 NOTE — Progress Notes (Signed)
Family Meeting Note  Advance Directive:yes  Today a meeting took place with the Patient.  The following clinical team members were present during this meeting:MD  The following were discussed:Patient's diagnosis: Hip fracture , Patient's progosis: Unable to determine and Goals for treatment: DNR  Additional follow-up to be provided: Orthopedics  Time spent during discussion:20 minutes  Vaughan Basta, MD

## 2018-06-03 NOTE — ED Notes (Addendum)
Pt reports she has a slide out of her chair yesterday, pt states she has been normal till this afternoon when grandson took her to [place] and she felt numbness in her leg numbness which required her to use her walker. Pt denies right arm numbness, pt is negative for facal droop as well as slurred speech. Pt is A/Ox4 and has no complaints of pain just numbness.  Pt is denies being dizzy. Family at bedside.

## 2018-06-03 NOTE — ED Triage Notes (Signed)
Pt to ED via POV c/o right leg numbness. Pt states that numbness was sudden onset at 1400. Pt denies any right arm numbness or weakness. Pt does not have slurred speech or facial droop. Pt is unable to lift right leg. Pt denies headache. When asked if pt is dizzy she states "not really".  Pt states that yesterday she was sitting a chair and reach to get something, slipping and landing on her right leg. Pt states that she was able to use leg and was walking fine until today around 2 pm. pt denies hitting her head when falling. Pt is A & O at this time.

## 2018-06-03 NOTE — H&P (Signed)
[facility] NAME: Denise Barry    MR#:  [id number]  DATE OF BIRTH:  1934-02-04  DATE OF ADMISSION:  06/03/2018  PRIMARY CARE PHYSICIAN: Einar Pheasant, MD   REQUESTING/REFERRING PHYSICIAN: Archie Balboa  CHIEF COMPLAINT:   Chief Complaint  Patient presents with  . Numbness    HISTORY OF PRESENT ILLNESS: Denise Barry  is a 84 y.o. female with a known history of diverticulitis, hypercholesterolemia, hypertension, osteoarthritis, varicose vein-lives alone in independent life, walks with a walker in a single level town home. She slide off her chair yesterday and fell on the floor.  She did not had any major injuries and was able to walk fine yesterday evening. Today she was in the car going to her discharge and while trying to get out she felt significant pain in her right hip and since then could not walk much and brought to emergency room. Noted to have a fracture in right femur and advised to admit to medical services. On further questioning, she denies any urinary symptoms.  PAST MEDICAL HISTORY:   Past Medical History:  Diagnosis Date  . Allergy   . Diverticulitis    11/18  . Hypercholesterolemia   . Hypertension   . Osteoarthritis   . Urinary incontinence    pessary in place  . Varicose veins    H/O    PAST SURGICAL HISTORY:  Past Surgical History:  Procedure Laterality Date  . BACK SURGERY  09/16/13  . DILATION AND CURETTAGE OF UTERUS  1971  . JOINT REPLACEMENT    . sclerosis  2000  . TUBAL LIGATION  1972    SOCIAL HISTORY:  Social History   Tobacco Use  . Smoking status: Former Research scientist (life sciences)  . Smokeless tobacco: Never Used  Substance Use Topics  . Alcohol use: No    Alcohol/week: 0.0 oz    FAMILY HISTORY:  Family History  Problem Relation Age of Onset  . Heart disease Mother   . Diabetes Mother   . Hypertension Mother   . Heart disease Father   . Stroke Father   . Cancer Son        bladder  . COPD Son    . Breast cancer Neg Hx     DRUG ALLERGIES:  Allergies  Allergen Reactions  . Codeine Nausea Only    GI upset  . Cyclobenzaprine     Other reaction(s): Headache  . Methocarbamol     Other reaction(s): Other (See Comments)  . Sulfa Antibiotics Nausea And Vomiting  . Carbamazepine Anxiety  . Gabapentin Anxiety    REVIEW OF SYSTEMS:   CONSTITUTIONAL: No fever, fatigue or weakness.  EYES: No blurred or double vision.  EARS, NOSE, AND THROAT: No tinnitus or ear pain.  RESPIRATORY: No cough, shortness of breath, wheezing or hemoptysis.  CARDIOVASCULAR: No chest pain, orthopnea, edema.  GASTROINTESTINAL: No nausea, vomiting, diarrhea or abdominal pain.  GENITOURINARY: No dysuria, hematuria.  ENDOCRINE: No polyuria, nocturia,  HEMATOLOGY: No anemia, easy bruising or bleeding SKIN: No rash or lesion. MUSCULOSKELETAL: Right hip joint pain.   NEUROLOGIC: No tingling, numbness, weakness.  PSYCHIATRY: No anxiety or depression.   MEDICATIONS AT HOME:  Prior to Admission medications   Medication Sig Start Date End Date Taking? Authorizing Provider  Acetaminophen (TYLENOL EXTRA STRENGTH PO) Take 1-2 tablets by mouth as needed (PAIN).    Yes [provider]  amLODipine (NORVASC) 10 MG tablet Take 1 tablet (10 mg total) by mouth daily.  11/24/17  Yes Einar Pheasant, MD  aspirin 81 MG tablet Take 81 mg by mouth daily.   Yes [provider]  docusate sodium (COLACE) 100 MG capsule Take 100 mg by mouth daily.   Yes [provider]  Eslicarbazepine Acetate 400 MG TABS Take 1 tablet by mouth 3 (three) times daily.   Yes [provider]  estradiol (ESTRACE VAGINAL) 0.1 MG/GM vaginal cream Apply vaginally once monthly PRN 05/22/16  Yes [provider]  Fe Fum-FePoly-Vit C-Vit B3 (INTEGRA) 62.5-62.5-40-3 MG CAPS Take one tablet daily Patient taking differently: Take 1 tablet by mouth daily.  02/26/18  Yes Einar Pheasant, MD  Multiple Vitamin  (MULTI-VITAMINS) TABS Take 1 tablet by mouth.    Yes [provider]  traMADol (ULTRAM) 50 MG tablet Take 1 tablet (50 mg total) by mouth every 6 (six) hours as needed for severe pain. Patient taking differently: Take 50 mg by mouth 3 (three) times daily.  05/31/18 11/27/18 Yes Vevelyn Francois, NP  triamcinolone cream (KENALOG) 0.1 % Apply 1 application topically 2 (two) times daily. 04/24/18  Yes Einar Pheasant, MD      PHYSICAL EXAMINATION:   VITAL SIGNS: Blood pressure (!) 163/69, pulse 85, temperature 97.7 F (36.5 C), temperature source Oral, resp. rate 16, height 5\' 6"  (1.676 m), weight 70.3 kg (155 lb), SpO2 100 %.  GENERAL:  84 y.o.-year-old patient lying in the bed with no acute distress.  EYES: Pupils equal, round, reactive to light and accommodation. No scleral icterus. Extraocular muscles intact.  HEENT: Head atraumatic, normocephalic. Oropharynx and nasopharynx clear.  NECK:  Supple, no jugular venous distention. No thyroid enlargement, no tenderness.  LUNGS: Normal breath sounds bilaterally, no wheezing, rales,rhonchi or crepitation. No use of accessory muscles of respiration.  CARDIOVASCULAR: S1, S2 normal. No murmurs, rubs, or gallops.  ABDOMEN: Soft, nontender, nondistended. Bowel sounds present. No organomegaly or mass.  EXTREMITIES: No pedal edema, cyanosis, or clubbing.  NEUROLOGIC: Cranial nerves II through XII are intact. Muscle strength 5/5 in all extremities except for right lower extremity where she has fracture and pains are not moving. Sensation intact. Gait not checked.  PSYCHIATRIC: The patient is alert and oriented x 3.  SKIN: No obvious rash, lesion, or ulcer.   LABORATORY PANEL:   CBC Recent Labs  Lab 06/03/18 1709  WBC 14.3*  HGB 11.3*  HCT 34.6*  PLT 322  MCV 83.1  MCH 27.1  MCHC 32.6  RDW 20.0*  LYMPHSABS 0.6*  MONOABS 0.9  EOSABS 0.0  BASOSABS 0.1    ------------------------------------------------------------------------------------------------------------------  Chemistries  Recent Labs  Lab 06/03/18 1709  NA 138  K 3.3*  CL 101  CO2 27  GLUCOSE 144*  BUN 18  CREATININE 0.78  CALCIUM 8.9   ------------------------------------------------------------------------------------------------------------------ estimated creatinine clearance is 51.6 mL/min (by C-G formula based on SCr of 0.78 mg/dL). ------------------------------------------------------------------------------------------------------------------ No results for input(s): TSH, T4TOTAL, T3FREE, THYROIDAB in the last 72 hours.  Invalid input(s): FREET3   Coagulation profile No results for input(s): INR, PROTIME in the last 168 hours. ------------------------------------------------------------------------------------------------------------------- No results for input(s): DDIMER in the last 72 hours. -------------------------------------------------------------------------------------------------------------------  Cardiac Enzymes No results for input(s): CKMB, TROPONINI, MYOGLOBIN in the last 168 hours.  Invalid input(s): CK ------------------------------------------------------------------------------------------------------------------ Invalid input(s): POCBNP  ---------------------------------------------------------------------------------------------------------------  Urinalysis    Component Value Date/Time   COLORURINE YELLOW 11/16/2017 [place] 11/16/2017 1156   APPEARANCEUR Clear 11/07/2017 1449   LABSPEC 1.020 11/16/2017 1156   LABSPEC 1.020 02/05/2015 0830   PHURINE  7.0 11/16/2017 [place] 11/16/2017 [place] 11/16/2017 [place] 11/16/2017 1156   BILIRUBINUR Negative 11/07/2017 1449   BILIRUBINUR Negative 02/05/2015 0830   KETONESUR NEGATIVE 11/16/2017 1156   PROTEINUR  Trace 11/07/2017 1449   PROTEINUR Negative 02/05/2015 0830   UROBILINOGEN 0.2 11/16/2017 1156   NITRITE NEGATIVE 11/16/2017 1156   LEUKOCYTESUR SMALL (A) 11/16/2017 1156   LEUKOCYTESUR 1+ (A) 11/07/2017 1449   LEUKOCYTESUR 2+ 02/05/2015 0830     RADIOLOGY: Dg Chest Portable 1 View  Result Date: 06/03/2018 CLINICAL DATA:  Hip fracture. Former smoker 20 years ago with hypertension. EXAM: PORTABLE CHEST 1 VIEW COMPARISON:  02/03/2015 FINDINGS: Heart is top-normal in size. There is mild-to-moderate aortic atherosclerosis with slight uncoiling of the thoracic aorta. Probable small hiatal hernia accounting for soft tissue density projecting over the cardiac silhouette. Costochondral calcifications are present bilaterally. No pulmonary consolidations suspicious for pneumonia. No effusion or pneumothorax. No pulmonary edema. Partially included left humerus fixation hardware is noted. IMPRESSION: Aortic atherosclerosis.  No active pulmonary disease. Electronically Signed   By: Ashley Royalty M.D.   On: 06/03/2018 18:19   Dg Hip Unilat W Or Wo Pelvis 2-3 Views Right  Result Date: 06/03/2018 CLINICAL DATA:  Patient fell from a chair yesterday and reported no pain in the right hip patella 2 p.m. today. EXAM: DG HIP (WITH OR WITHOUT PELVIS) 2-3V RIGHT COMPARISON:  None. FINDINGS: There is a varus angulated varus angulated transcervical fracture of the right femur with probable slight impaction of the femoral head upon the base of femoral head. Osteoarthritic joint space narrowing is seen of the native right hip. The bony pelvis appears intact without apparent fracture. Partially included lower lumbar posterior spinal fusion hardware extends to the L5 level bilaterally. There is lower lumbar degenerative disc and facet arthropathy from L4 through S1. No pelvic diastasis. A bipolar left hip arthroplasty also noted with cemented femoral component but without without complicating features. Gluteal enthesopathy is noted  off the left greater trochanter. A pessary is in place. IMPRESSION: Acute varus angulated slightly impacted transcervical fracture of the right femur. Electronically Signed   By: Ashley Royalty M.D.   On: 06/03/2018 17:50    EKG: Orders placed or performed during the hospital encounter of 06/03/18  . ED EKG  . ED EKG    IMPRESSION AND PLAN:  *Right hip pain-femur fracture Admit to medical services, orthopedic surgery consult for further management. Will give heparin subcutaneous 1 dose tonight and then let orthopedic team handle that. Pain management as needed. Patient does not have history of coronary artery disease, at her baseline she has limited mobility.  She does not have any respiratory issues. Due to her age she is at moderate risk for the surgery, but she is optimized for the surgery from medical point and we can proceed as soon as tomorrow for the surgery.  *Trigeminal neuralgia Continue with home medications.  *Hypertension Continue amlodipine.  *Hypokalemia Replace oral.  All the records are reviewed and case discussed with ED provider. Management plans discussed with the patient, family and they are in agreement.  CODE STATUS: DNR Code Status History    Date Active Date Inactive Code Status Order ID Comments User Context   10/05/2017 2135 10/11/2017 1902 Full Code [id number]  Clayburn Pert, MD Inpatient    Advance Directive Documentation     Most Recent Value  Type of Advance Directive  Healthcare Power of [place]  Pre-existing out of  facility DNR order (yellow form or pink MOST form)  -  "MOST" Form in Place?  -       TOTAL TIME TAKING CARE OF THIS PATIENT: 50 minutes.    Vaughan Basta M.D on 06/03/2018   Between 7am to 6pm - Pager - [phone]  After 6pm go to [email or web address] - password [email or web address] [facility]  Office  [phone]  CC: Primary care physician; Einar Pheasant, MD   Note: This dictation was prepared with  Dragon dictation along with smaller phrase technology. Any transcriptional errors that result from this process are unintentional.

## 2018-06-03 NOTE — ED Notes (Signed)
Ashley RN, aware of bed assigned  

## 2018-06-03 NOTE — Progress Notes (Signed)
Patient rated pain 9/10 however requested only to receive tramadol.

## 2018-06-03 NOTE — ED Notes (Signed)
Pt has returned to room from xray. Pt on monitor at this time. RN will monitor.

## 2018-06-03 NOTE — ED Triage Notes (Signed)
States slipped off of a chair yesterday afternoon.  Today c/o right leg weakness and pain with movement.

## 2018-06-04 ENCOUNTER — Inpatient Hospital Stay: Payer: Medicare Other

## 2018-06-04 ENCOUNTER — Encounter
Admission: EM | Disposition: A | Discharge status: Skilled Nursing Facility | Payer: Self-pay | Source: Home / Self Care | Attending: Internal Medicine

## 2018-06-04 ENCOUNTER — Inpatient Hospital Stay: Payer: Medicare Other | Admitting: Certified Registered"

## 2018-06-04 ENCOUNTER — Encounter: Payer: Self-pay | Admitting: Anesthesiology

## 2018-06-04 HISTORY — PX: HIP ARTHROPLASTY: SHX981

## 2018-06-04 LAB — BASIC METABOLIC PANEL
Anion gap: 8 (ref 5–15)
BUN: 11 mg/dL (ref 8–23)
CHLORIDE: 103 mmol/L (ref 98–111)
CO2: 26 mmol/L (ref 22–32)
Calcium: 8.4 mg/dL — ABNORMAL LOW (ref 8.9–10.3)
Creatinine, Ser: 0.64 mg/dL (ref 0.44–1.00)
GFR calc non Af Amer: >60 mL/min (ref >60)
Glucose, Bld: 110 mg/dL — ABNORMAL HIGH (ref 70–99)
Potassium: 3.5 mmol/L (ref 3.5–5.1)
Sodium: 137 mmol/L (ref 135–145)

## 2018-06-04 LAB — CBC
HEMATOCRIT: 32.5 % — AB (ref 35.0–47.0)
HEMOGLOBIN: 10.8 g/dL — AB (ref 12.0–16.0)
MCH: 27.6 pg (ref 26.0–34.0)
MCHC: 33.3 g/dL (ref 32.0–36.0)
MCV: 83 fL (ref 80.0–100.0)
Platelets: 298 10*3/uL (ref 150–440)
RBC: 3.92 MIL/uL (ref 3.80–5.20)
RDW: 19.7 % — ABNORMAL HIGH (ref 11.5–14.5)
WBC: 8.9 10*3/uL (ref 3.6–11.0)

## 2018-06-04 SURGERY — HEMIARTHROPLASTY, HIP, DIRECT ANTERIOR APPROACH, FOR FRACTURE
Anesthesia: Spinal | Laterality: Right

## 2018-06-04 MED ORDER — ONDANSETRON HCL 4 MG/2ML IJ SOLN: 4.0000 mg | Freq: Four times a day (QID) | INTRAMUSCULAR | Status: DC | PRN

## 2018-06-04 MED ORDER — PROPOFOL 10 MG/ML IV BOLUS
INTRAVENOUS | Status: DC | PRN
  Administered 2018-06-04: 20 mg via INTRAVENOUS

## 2018-06-04 MED ORDER — CELECOXIB 200 MG PO CAPS
200.0000 mg | ORAL_CAPSULE | Freq: Two times a day (BID) | ORAL | Status: DC
  Administered 2018-06-04 – 2018-06-06 (×4): 200 mg via ORAL
  Filled 2018-06-04 (×4): qty 1

## 2018-06-04 MED ORDER — HYDROCODONE-ACETAMINOPHEN 7.5-325 MG PO TABS
1.0000 | ORAL_TABLET | Freq: Four times a day (QID) | ORAL | Status: DC | PRN
  Administered 2018-06-04: 1 via ORAL
  Filled 2018-06-04: qty 1

## 2018-06-04 MED ORDER — PROPOFOL 500 MG/50ML IV EMUL
INTRAVENOUS | Status: DC | PRN
  Administered 2018-06-04: 25 ug/kg/min via INTRAVENOUS

## 2018-06-04 MED ORDER — DEXAMETHASONE SODIUM PHOSPHATE 10 MG/ML IJ SOLN
INTRAMUSCULAR | Status: AC
  Filled 2018-06-04: qty 1

## 2018-06-04 MED ORDER — BUPIVACAINE HCL (PF) 0.5 % IJ SOLN
INTRAMUSCULAR | Status: AC
  Filled 2018-06-04: qty 10

## 2018-06-04 MED ORDER — BISACODYL 10 MG RE SUPP: 10.0000 mg | Freq: Every day | RECTAL | Status: DC | PRN

## 2018-06-04 MED ORDER — CLINDAMYCIN PHOSPHATE 600 MG/50ML IV SOLN
600.0000 mg | Freq: Three times a day (TID) | INTRAVENOUS | Status: AC
  Administered 2018-06-04 – 2018-06-05 (×3): 600 mg via INTRAVENOUS
  Filled 2018-06-04 (×4): qty 50

## 2018-06-04 MED ORDER — GABAPENTIN 300 MG PO CAPS
300.0000 mg | ORAL_CAPSULE | Freq: Three times a day (TID) | ORAL | Status: DC
  Administered 2018-06-04 – 2018-06-06 (×5): 300 mg via ORAL
  Filled 2018-06-04 (×6): qty 1

## 2018-06-04 MED ORDER — FERROUS SULFATE 325 (65 FE) MG PO TABS
325.0000 mg | ORAL_TABLET | Freq: Every day | ORAL | Status: DC
  Administered 2018-06-05 – 2018-06-06 (×2): 325 mg via ORAL
  Filled 2018-06-04 (×2): qty 1

## 2018-06-04 MED ORDER — PROPOFOL 10 MG/ML IV BOLUS
INTRAVENOUS | Status: AC
  Filled 2018-06-04: qty 20

## 2018-06-04 MED ORDER — FLEET ENEMA 7-19 GM/118ML RE ENEM: 1.0000 | ENEMA | Freq: Once | RECTAL | Status: DC | PRN

## 2018-06-04 MED ORDER — MORPHINE SULFATE (PF) 2 MG/ML IV SOLN: 1.0000 mg | INTRAVENOUS | Status: DC | PRN

## 2018-06-04 MED ORDER — ASPIRIN EC 81 MG PO TBEC
81.0000 mg | DELAYED_RELEASE_TABLET | Freq: Every day | ORAL | Status: DC
Start: 2018-06-05 — End: 2018-06-04

## 2018-06-04 MED ORDER — ACETAMINOPHEN 325 MG PO TABS: 325.0000 mg | ORAL_TABLET | Freq: Four times a day (QID) | ORAL | Status: DC | PRN

## 2018-06-04 MED ORDER — MUPIROCIN 2 % EX OINT
1.0000 "application " | TOPICAL_OINTMENT | Freq: Two times a day (BID) | CUTANEOUS | Status: DC
  Administered 2018-06-04 – 2018-06-06 (×4): 1 via NASAL
  Filled 2018-06-04: qty 22

## 2018-06-04 MED ORDER — SODIUM CHLORIDE 0.45 % IV SOLN
INTRAVENOUS | Status: DC
  Administered 2018-06-04: 16:00:00 via INTRAVENOUS

## 2018-06-04 MED ORDER — CEFAZOLIN SODIUM-DEXTROSE 2-4 GM/100ML-% IV SOLN
2.0000 g | Freq: Three times a day (TID) | INTRAVENOUS | Status: AC
  Administered 2018-06-04 – 2018-06-05 (×3): 2 g via INTRAVENOUS
  Filled 2018-06-04 (×3): qty 100

## 2018-06-04 MED ORDER — HYDROCODONE-ACETAMINOPHEN 5-325 MG PO TABS
1.0000 | ORAL_TABLET | Freq: Four times a day (QID) | ORAL | Status: DC | PRN
  Administered 2018-06-05: 1 via ORAL
  Filled 2018-06-04: qty 1

## 2018-06-04 MED ORDER — MIDAZOLAM HCL 5 MG/5ML IJ SOLN
INTRAMUSCULAR | Status: DC | PRN
  Administered 2018-06-04: 1 mg via INTRAVENOUS

## 2018-06-04 MED ORDER — KETAMINE HCL 50 MG/ML IJ SOLN
INTRAMUSCULAR | Status: AC
  Filled 2018-06-04: qty 10

## 2018-06-04 MED ORDER — MENTHOL 3 MG MT LOZG: 1.0000 | LOZENGE | OROMUCOSAL | Status: DC | PRN

## 2018-06-04 MED ORDER — CHLORHEXIDINE GLUCONATE CLOTH 2 % EX PADS
6.0000 | MEDICATED_PAD | Freq: Every day | CUTANEOUS | Status: DC
  Administered 2018-06-04 – 2018-06-05 (×2): 6 via TOPICAL

## 2018-06-04 MED ORDER — ONDANSETRON HCL 4 MG/2ML IJ SOLN: 4.0000 mg | Freq: Once | INTRAMUSCULAR | Status: DC | PRN

## 2018-06-04 MED ORDER — ALUM & MAG HYDROXIDE-SIMETH 200-200-20 MG/5ML PO SUSP: 30.0000 mL | ORAL | Status: DC | PRN

## 2018-06-04 MED ORDER — ONDANSETRON HCL 4 MG PO TABS: 4.0000 mg | ORAL_TABLET | Freq: Four times a day (QID) | ORAL | Status: DC | PRN

## 2018-06-04 MED ORDER — ONDANSETRON HCL 4 MG/2ML IJ SOLN
INTRAMUSCULAR | Status: AC
  Filled 2018-06-04: qty 2

## 2018-06-04 MED ORDER — ENOXAPARIN SODIUM 30 MG/0.3ML ~~LOC~~ SOLN
30.0000 mg | SUBCUTANEOUS | Status: DC
  Administered 2018-06-05: 30 mg via SUBCUTANEOUS
  Filled 2018-06-04: qty 0.3

## 2018-06-04 MED ORDER — DOCUSATE SODIUM 100 MG PO CAPS
100.0000 mg | ORAL_CAPSULE | Freq: Two times a day (BID) | ORAL | Status: DC
  Administered 2018-06-04 – 2018-06-06 (×4): 100 mg via ORAL
  Filled 2018-06-04 (×3): qty 1

## 2018-06-04 MED ORDER — SODIUM CHLORIDE 0.9 % IV SOLN
INTRAVENOUS | Status: DC | PRN
  Administered 2018-06-04: 14:00:00

## 2018-06-04 MED ORDER — KETAMINE HCL 50 MG/ML IJ SOLN
INTRAMUSCULAR | Status: DC | PRN
  Administered 2018-06-04 (×2): 25 mg via INTRAVENOUS

## 2018-06-04 MED ORDER — METOCLOPRAMIDE HCL 10 MG PO TABS: 5.0000 mg | ORAL_TABLET | Freq: Three times a day (TID) | ORAL | Status: DC | PRN

## 2018-06-04 MED ORDER — BUPIVACAINE-EPINEPHRINE (PF) 0.25% -1:200000 IJ SOLN
INTRAMUSCULAR | Status: DC | PRN
  Administered 2018-06-04: 30 mL via PERINEURAL

## 2018-06-04 MED ORDER — ZOLPIDEM TARTRATE 5 MG PO TABS: 5.0000 mg | ORAL_TABLET | Freq: Every evening | ORAL | Status: DC | PRN

## 2018-06-04 MED ORDER — CEFAZOLIN SODIUM 1 G IJ SOLR
INTRAMUSCULAR | Status: AC
  Filled 2018-06-04: qty 10

## 2018-06-04 MED ORDER — MIDAZOLAM HCL 2 MG/2ML IJ SOLN
INTRAMUSCULAR | Status: AC
  Filled 2018-06-04: qty 2

## 2018-06-04 MED ORDER — EPHEDRINE SULFATE 50 MG/ML IJ SOLN
INTRAMUSCULAR | Status: DC | PRN
  Administered 2018-06-04: 10 mg via INTRAVENOUS

## 2018-06-04 MED ORDER — FENTANYL CITRATE (PF) 100 MCG/2ML IJ SOLN: 25.0000 ug | INTRAMUSCULAR | Status: DC | PRN

## 2018-06-04 MED ORDER — SODIUM CHLORIDE 0.9 % IV SOLN
INTRAVENOUS | Status: DC | PRN
  Administered 2018-06-04: 30 ug/min via INTRAVENOUS

## 2018-06-04 MED ORDER — PHENOL 1.4 % MT LIQD: 1.0000 | OROMUCOSAL | Status: DC | PRN

## 2018-06-04 MED ORDER — BUPIVACAINE HCL (PF) 0.5 % IJ SOLN
INTRAMUSCULAR | Status: DC | PRN
  Administered 2018-06-04: 3 mL via INTRATHECAL

## 2018-06-04 MED ORDER — FENTANYL CITRATE (PF) 100 MCG/2ML IJ SOLN
INTRAMUSCULAR | Status: AC
  Filled 2018-06-04: qty 2

## 2018-06-04 MED ORDER — METOCLOPRAMIDE HCL 5 MG/ML IJ SOLN: 5.0000 mg | Freq: Three times a day (TID) | INTRAMUSCULAR | Status: DC | PRN

## 2018-06-04 MED ORDER — PHENYLEPHRINE HCL 10 MG/ML IJ SOLN
INTRAMUSCULAR | Status: DC | PRN
  Administered 2018-06-04 (×7): 100 ug via INTRAVENOUS

## 2018-06-04 SURGICAL SUPPLY — 60 items
BAG COUNTER SPONGE EZ (MISCELLANEOUS) ×2 IMPLANT
BAG SPNG 4X4 CLR HAZ (MISCELLANEOUS) ×1
BLADE DEBAKEY 8.0 (BLADE) ×2 IMPLANT
BLADE DEBAKEY 8.0MM (BLADE) ×1
BLADE SAGITTAL WIDE XTHICK NO (BLADE) ×3 IMPLANT
BLADE SURG SZ10 CARB STEEL (BLADE) ×3 IMPLANT
CANISTER SUCT 1200ML W/VALVE (MISCELLANEOUS) ×9 IMPLANT
CHLORAPREP W/TINT 26ML (MISCELLANEOUS) ×6 IMPLANT
COUNTER SPONGE BAG EZ (MISCELLANEOUS) ×1
DRAPE INCISE IOBAN 66X60 STRL (DRAPES) ×6 IMPLANT
DRAPE TABLE BACK 80X90 (DRAPES) ×3 IMPLANT
DRSG AQUACEL AG ADV 3.5X10 (GAUZE/BANDAGES/DRESSINGS) ×3 IMPLANT
DRSG AQUACEL AG ADV 3.5X14 (GAUZE/BANDAGES/DRESSINGS) ×3 IMPLANT
ELECT BLADE 6.5 EXT (BLADE) ×3 IMPLANT
ELECT CAUTERY BLADE 6.4 (BLADE) ×3 IMPLANT
ELECT REM PT RETURN 9FT ADLT (ELECTROSURGICAL) ×3
ELECTRODE REM PT RTRN 9FT ADLT (ELECTROSURGICAL) ×1 IMPLANT
GAUZE PETRO XEROFOAM 1X8 (MISCELLANEOUS) ×6 IMPLANT
GAUZE SPONGE 4X4 12PLY STRL (GAUZE/BANDAGES/DRESSINGS) ×3 IMPLANT
GLOVE INDICATOR 8.0 STRL GRN (GLOVE) ×3 IMPLANT
GLOVE SURG ORTHO 8.5 STRL (GLOVE) ×3 IMPLANT
GOWN STRL REUS W/ TWL LRG LVL3 (GOWN DISPOSABLE) ×2 IMPLANT
GOWN STRL REUS W/TWL LRG LVL3 (GOWN DISPOSABLE) ×6
GOWN STRL REUS W/TWL LRG LVL4 (GOWN DISPOSABLE) ×3 IMPLANT
HEAD MODULAR ENDO (Orthopedic Implant) ×3 IMPLANT
HEAD UNPLR 49XMDLR STRL HIP (Orthopedic Implant) IMPLANT
HEMOVAC 400CC 10FR (MISCELLANEOUS) ×3 IMPLANT
IV NS 1000ML (IV SOLUTION) ×3
IV NS 1000ML BAXH (IV SOLUTION) ×1 IMPLANT
KIT TURNOVER KIT A (KITS) ×3 IMPLANT
NDL FILTER BLUNT 18X1 1/2 (NEEDLE) ×1 IMPLANT
NDL MAYO CATGUT SZ4 TPR NDL (NEEDLE) ×1 IMPLANT
NDL SPNL 18GX3.5 QUINCKE PK (NEEDLE) ×2 IMPLANT
NEEDLE FILTER BLUNT 18X 1/2SAF (NEEDLE) ×2
NEEDLE FILTER BLUNT 18X1 1/2 (NEEDLE) ×1 IMPLANT
NEEDLE MAYO CATGUT SZ4 (NEEDLE) ×3 IMPLANT
NEEDLE SPNL 18GX3.5 QUINCKE PK (NEEDLE) ×6 IMPLANT
NS IRRIG 1000ML POUR BTL (IV SOLUTION) ×3 IMPLANT
PACK HIP PROSTHESIS (MISCELLANEOUS) ×3 IMPLANT
PAD ABD DERMACEA PRESS 5X9 (GAUZE/BANDAGES/DRESSINGS) ×6 IMPLANT
PULSAVAC PLUS IRRIG FAN TIP (DISPOSABLE) ×3
SLEEVE PROTECTION STRL DISP (MISCELLANEOUS) ×2 IMPLANT
SLEEVE UNITRAX V40 (Orthopedic Implant) ×3 IMPLANT
SLEEVE UNITRAX V40 +4 (Orthopedic Implant) IMPLANT
SOL PREP PVP 2OZ (MISCELLANEOUS) ×3
SOLUTION PREP PVP 2OZ (MISCELLANEOUS) ×1 IMPLANT
STAPLER SKIN PROX 35W (STAPLE) ×3 IMPLANT
STEM HIP 127 DEG (Stem) ×2 IMPLANT
SUT DVC 2 QUILL PDO  T11 36X36 (SUTURE) ×4
SUT DVC 2 QUILL PDO T11 36X36 (SUTURE) ×2 IMPLANT
SUT QUILL PDO 0 36 36 VIOLET (SUTURE) ×3 IMPLANT
SUT TICRON 2-0 30IN 311381 (SUTURE) ×15 IMPLANT
SYR 10ML LL (SYRINGE) ×3 IMPLANT
SYR 30ML LL (SYRINGE) ×3 IMPLANT
SYR 50ML LL SCALE MARK (SYRINGE) ×3 IMPLANT
TAPE MICROFOAM 4IN (TAPE) ×3 IMPLANT
TIP BRUSH PULSAVAC PLUS 24.33 (MISCELLANEOUS) ×3 IMPLANT
TIP FAN IRRIG PULSAVAC PLUS (DISPOSABLE) ×1 IMPLANT
TUBE SUCT KAM VAC (TUBING) ×5 IMPLANT
WATER STERILE IRR 1000ML POUR (IV SOLUTION) ×3 IMPLANT

## 2018-06-04 NOTE — H&P (Signed)
THE PATIENT WAS SEEN PRIOR TO SURGERY TODAY.  HISTORY, ALLERGIES, HOME MEDICATIONS AND OPERATIVE PROCEDURE WERE REVIEWED. RISKS AND BENEFITS OF SURGERY DISCUSSED WITH PATIENT AGAIN.  NO CHANGES FROM INITIAL HISTORY AND PHYSICAL NOTED.    

## 2018-06-04 NOTE — Anesthesia Procedure Notes (Signed)
Spinal  Patient location during procedure: OR Start time: 06/04/2018 12:24 PM End time: 06/04/2018 12:26 PM Staffing Anesthesiologist: Gunnar Bulla, MD Resident/CRNA: Rolla Plate, CRNA Other anesthesia staff: Marin Shutter, RN Performed: resident/CRNA  Preanesthetic Checklist Completed: patient identified, site marked, surgical consent, pre-op evaluation, timeout performed, IV checked, risks and benefits discussed and monitors and equipment checked Spinal Block Patient position: sitting Prep: Betadine, ChloraPrep and site prepped and draped Patient monitoring: heart rate, continuous pulse ox and blood pressure Approach: midline Location: L4-5 Injection technique: single-shot Needle Needle type: Introducer and Pencan  Needle gauge: 24 G Needle length: 9 cm Additional Notes Negative paresthesia. Negative blood return. Positive free-flowing CSF. Expiration date of kit checked and confirmed. Patient tolerated procedure well, without complications.

## 2018-06-04 NOTE — NC FL2 (Signed)
[place] LEVEL OF CARE SCREENING TOOL     IDENTIFICATION  Patient Name: Denise Barry Birthdate: 06/23/1934 Sex: female Admission Date (Current Location): 06/03/2018  [place] and [place] Number:  Engineering geologist and Address:  [facility], [address], [place], [place] [address]      Provider Number: [id number]  Attending Physician Name and Address:  Loletha Grayer, MD  Relative Name and Phone Number:       Current Level of Care: Hospital Recommended Level of Care: [place] Prior Approval Number:    Date Approved/Denied:   PASRR Number: [id number])  Discharge Plan: SNF    Current Diagnoses: Patient Active Problem List   Diagnosis Date Noted  . Hip fracture, unspecified laterality, closed, initial encounter ([place]) 06/03/2018  . Hip fracture ([place]) 06/03/2018  . Chronic venous insufficiency 05/20/2018  . Lymphedema 04/24/2018  . Spondylosis without myelopathy or radiculopathy, lumbosacral region 12/21/2017  . Other specified dorsopathies, sacral and sacrococcygeal region 12/21/2017  . Difficulty sleeping 10/22/2017  . Diverticulitis   . Abdominal pain 10/05/2017  . Elevated WBC count 10/05/2017  . Granulocytosis 10/05/2017  . Diverticulitis of large intestine with perforation 10/05/2017  . Chronic musculoskeletal pain 09/13/2017  . Lumbar Spondylosis 09/13/2017  . Lumbar facet syndrome (Bilateral) (L>R) 09/13/2017  . Chronic sacroiliac joint pain (Bilateral) (L>R) 09/13/2017  . Old Compression fracture of L1 and L2 09/12/2017  . DDD (degenerative disc disease), thoracic 09/12/2017  . Scoliosis 09/12/2017  . Abnormal MRI, lumbar spine (07/25/2017) 09/12/2017  . Chronic low back pain (Primary Area of Pain)  (Bilateral) (L>R) 08/28/2017  . Chronic pain syndrome 08/28/2017  . Opiate use 08/28/2017  . Other long term (current) drug therapy 08/28/2017  . Other specified health status 08/28/2017  .  Other reduced mobility 08/28/2017  . Disorder of bone, unspecified 08/28/2017  . Syncope 07/08/2017  . Anemia 06/10/2017  . SUNCT (short unilateral neuralgiform headache, conjunctival inj/tear) 07/09/2015  . History of shoulder fracture (Left) 06/03/2015  . Hyponatremia 05/07/2015  . Closed 2-part displaced fracture of surgical neck of left humerus 02/09/2015  . Health care maintenance 01/18/2015  . History of hip fracture (Left) 07/13/2014  . Chronic Trigeminal neuralgia (Right) 01/07/2014  . Essential hypertension, benign 01/07/2014  . Hypercholesterolemia 01/07/2014  . Failed back surgical syndrome (L4-5 fusion) 01/07/2014  . Urinary incontinence 01/07/2014  . Acquired spondylolisthesis 09/13/2013  . Spinal stenosis, unspecified region other than cervical 09/13/2013  . DDD (degenerative disc disease), lumbar 06/05/2013    Orientation RESPIRATION BLADDER Height & Weight     Self, Time, Situation, Place  Normal Continent Weight: 70.3 kg (155 lb) Height:  5\' 6"  (167.6 cm)  BEHAVIORAL SYMPTOMS/MOOD NEUROLOGICAL BOWEL NUTRITION STATUS      Continent Diet(Diet: NPO for surgery to be advanced. )  AMBULATORY STATUS COMMUNICATION OF NEEDS Skin   Extensive Assist Verbally Surgical wounds                       Personal Care Assistance Level of Assistance  Bathing, Feeding, Dressing Bathing Assistance: Limited assistance Feeding assistance: Independent Dressing Assistance: Limited assistance     Functional Limitations Info  Sight, Hearing, Speech Sight Info: Adequate Hearing Info: Adequate Speech Info: Adequate    SPECIAL CARE FACTORS FREQUENCY  PT (By licensed PT), OT (By licensed OT)     PT Frequency: (5) OT Frequency: (5)            Contractures  Additional Factors Info  Code Status, Allergies Code Status Info: (DNR ) Allergies Info: (Codeine, Cyclobenzaprine, Methocarbamol, Sulfa Antibiotics Carbamazepine, Gabapentin)           Current Medications  (06/04/2018):  This is the current hospital active medication list Current Facility-Administered Medications  Medication Dose Route Frequency Provider Last Rate Last Dose  . 0.9 %  sodium chloride infusion   Intravenous Continuous Earnestine Leys, MD 75 mL/hr at 06/04/18 1054    . [MAR Hold] acetaminophen (TYLENOL) tablet 650 mg  650 mg Oral Q6H PRN Vaughan Basta, MD      . Doug Sou Hold] amLODipine (NORVASC) tablet 10 mg  10 mg Oral Daily Vaughan Basta, MD   10 mg at 06/04/18 0852  . [MAR Hold] aspirin EC tablet 81 mg  81 mg Oral Daily Wieting, Richard, MD      . bupivacaine-epinephrine (MARCAINE W/ EPI) 0.25% -1:200000 injection    PRN Earnestine Leys, MD   30 mL at 06/04/18 1300  . [MAR Hold] Chlorhexidine Gluconate Cloth 2 % PADS 6 each  6 each Topical Daily Earnestine Leys, MD   6 each at 06/04/18 [phone]  . [MAR Hold] docusate sodium (COLACE) capsule 100 mg  100 mg Oral Daily Vaughan Basta, MD      . Doug Sou Hold] docusate sodium (COLACE) capsule 100 mg  100 mg Oral BID PRN Vaughan Basta, MD      . Doug Sou Hold] Eslicarbazepine Acetate TABS 400 mg  1 tablet Oral TID Vaughan Basta, MD   400 mg at 06/04/18 1058  . [MAR Hold] ferrous [id number] C-folic acid (TRINSICON / FOLTRIN) capsule 1 capsule  1 capsule Oral Daily Vaughan Basta, MD      . Doug Sou Hold] HYDROcodone-acetaminophen (NORCO/VICODIN) 5-325 MG per tablet 1 tablet  1 tablet Oral Q4H PRN Earnestine Leys, MD      . Doug Sou Hold] morphine 2 MG/ML injection 1 mg  1 mg Intravenous Q2H PRN Earnestine Leys, MD      . Doug Sou Hold] multivitamin with minerals tablet 1 tablet  1 tablet Oral QPC supper Vaughan Basta, MD      . Doug Sou Hold] mupirocin ointment (BACTROBAN) 2 % 1 application  1 application Nasal BID Earnestine Leys, MD   1 application at 44/03/47 [phone]  . [MAR Hold] potassium chloride SA (K-DUR,KLOR-CON) CR tablet 40 mEq  40 mEq Oral BID Earnestine Leys, MD   40 mEq at 06/03/18 2119  . [MAR  Hold] traMADol (ULTRAM) tablet 50 mg  50 mg Oral Q6H PRN Vaughan Basta, MD   50 mg at 06/04/18 1058  . [MAR Hold] triamcinolone cream (KENALOG) 0.1 % 1 application  1 application Topical BID PRN Vaughan Basta, MD       Facility-Administered Medications Ordered in Other Encounters  Medication Dose Route Frequency Provider Last Rate Last Dose  . bupivacaine (MARCAINE) 0.5 % injection   Intrathecal Anesthesia Intra-op Rolla Plate, CRNA   3 mL at 06/04/18 1226  . ketamine (KETALAR) injection    Anesthesia Intra-op Rolla Plate, CRNA   25 mg at 06/04/18 1342  . midazolam (VERSED) 5 MG/5ML injection   Intravenous Anesthesia Intra-op Rolla Plate, CRNA   1 mg at 06/04/18 1218  . phenylephrine (NEO-SYNEPHRINE) 100 mcg/mL in sodium chloride 0.9 % 100 mL infusion   Intravenous Continuous PRN Rolla Plate, CRNA 30 mL/hr at 06/04/18 1328 50 mcg/min at 06/04/18 1328  . phenylephrine (NEO-SYNEPHRINE) injection   Intravenous Anesthesia Intra-op Rolla Plate, CRNA   100 mcg at 06/04/18 1310  .  propofol (DIPRIVAN) 10 mg/mL bolus/IV push   Intravenous Anesthesia Intra-op Rolla Plate, CRNA   20 mg at 06/04/18 1220  . propofol (DIPRIVAN) 500 MG/50ML infusion   Intravenous Continuous PRN Rolla Plate, CRNA 12.7 mL/hr at 06/04/18 1324 30 mcg/kg/min at 06/04/18 1324     Discharge Medications: Please see discharge summary for a list of discharge medications.  Relevant Imaging Results:  Relevant Lab Results:   Additional Information (SSN: [id number])  Baily Serpe, Veronia Beets, LCSW

## 2018-06-04 NOTE — Progress Notes (Signed)
Patient accepted bed offer from [facility]. [place] admissions coordinator at [facility] is aware of above.   [employer], LCSW [phone]

## 2018-06-04 NOTE — Anesthesia Preprocedure Evaluation (Signed)
Anesthesia Evaluation  Patient identified by MRN, date of birth, ID band Patient awake    Reviewed: Allergy & Precautions, NPO status , Patient's Chart, lab work & pertinent test results, reviewed documented beta blocker date and time   Airway Mallampati: II  TM Distance: >3 FB     Dental  (+) Chipped   Pulmonary former smoker,           Cardiovascular hypertension, Pt. on medications + Peripheral Vascular Disease       Neuro/Psych  Headaches,  Neuromuscular disease    GI/Hepatic   Endo/Other    Renal/GU      Musculoskeletal  (+) Arthritis ,   Abdominal   Peds  Hematology  (+) anemia ,   Anesthesia Other Findings Hb 10.8. Sats tend to be low for her, in the 92-95% range. CXR ok.  Reproductive/Obstetrics                             Anesthesia Physical Anesthesia Plan  ASA: III  Anesthesia Plan: Spinal   Post-op Pain Management:    Induction:   PONV Risk Score and Plan:   Airway Management Planned:   Additional Equipment:   Intra-op Plan:   Post-operative Plan:   Informed Consent: I have reviewed the patients History and Physical, chart, labs and discussed the procedure including the risks, benefits and alternatives for the proposed anesthesia with the patient or authorized representative who has indicated his/her understanding and acceptance.     Plan Discussed with: CRNA  Anesthesia Plan Comments:         Anesthesia Quick Evaluation

## 2018-06-04 NOTE — Anesthesia Post-op Follow-up Note (Signed)
Anesthesia QCDR form completed.        

## 2018-06-04 NOTE — Clinical Social Work Placement (Addendum)
   CLINICAL SOCIAL WORK PLACEMENT  NOTE  Date:  06/04/2018  Patient Details  Name: Denise Barry MRN: [id number] Date of Birth: 06/10/1934  Clinical Social Work is seeking post-discharge placement for this patient at the [place] level of care (*CSW will initial, date and re-position this form in  chart as items are completed):  Yes   Patient/family provided with [facility] Work Department's list of facilities offering this level of care within the geographic area requested by the patient (or if unable, by the patient's family).  Yes   Patient/family informed of their freedom to choose among providers that offer the needed level of care, that participate in Medicare, Medicaid or managed care program needed by the patient, have an available bed and are willing to accept the patient.  Yes   Patient/family informed of [facility]'s ownership interest in [facility] and [facility], as well as of the fact that they are under no obligation to receive care at these facilities.  PASRR submitted to EDS on       PASRR number received on       Existing PASRR number confirmed on 06/04/18     FL2 transmitted to all facilities in geographic area requested by pt/family on 06/04/18     FL2 transmitted to all facilities within larger geographic area on       Patient informed that his/her managed care company has contracts with or will negotiate with certain facilities, including the following:         06-04-18   Patient/family informed of bed offers received.  Patient chooses bed at  [facility]     Physician recommends and patient chooses bed at      Patient to be transferred to  [facility] on  06-06-18.  Patient to be transferred to facility by  [facility]  Patient family notified on  06-06-18 of transfer.  Name of family member notified:   Message left on son [name]'s voice mail, and spoke to son Catalina Antigua on  the phone.     PHYSICIAN       Additional Comment:    _______________________________________________ Sample, Veronia Beets, LCSW 06/04/2018, 1:59 PM  Updated Jones Broom. [place], MSW, [place]  06/06/2018 8:42 AM

## 2018-06-04 NOTE — Consult Note (Signed)
ORTHOPAEDIC CONSULTATION  REQUESTING PHYSICIAN: Loletha Grayer, MD  Chief Complaint: Right hip pain  HPI: Denise Barry is a 84 y.o. female who complains of right hip pain.  She slid out of a chair 2 days ago and injured her right hip.  The pain was not bad until yesterday when she tried to drive and had increased pain.  She was brought to the emergency room where exam and x-rays revealed a displaced subcapital fracture of the right hip.  She has been admitted for medical evaluation and operative fixation of the fracture.  She had a similar fracture on the left side 4 years ago which was treated at [facility] with hemiarthroplasty.  Patient and family are present for discussion today.  I have recommended hemiarthroplasty of the right hip to address this problem.  Risks and benefits and postop protocol were discussed with them at length.  As a been through this before they are agreeable to proceeding with this plan of treatment.  We will plan on operating on this later today.  Past Medical History:  Diagnosis Date  . Allergy   . Diverticulitis    11/18  . Hypercholesterolemia   . Hypertension   . Osteoarthritis   . Urinary incontinence    pessary in place  . Varicose veins    H/O   Past Surgical History:  Procedure Laterality Date  . BACK SURGERY  09/16/13  . DILATION AND CURETTAGE OF UTERUS  1971  . JOINT REPLACEMENT    . sclerosis  2000  . TUBAL LIGATION  1972   Social History   Socioeconomic History  . Marital status: Widowed    Spouse name: Not on file  . Number of children: Not on file  . Years of education: Not on file  . Highest education level: Not on file  Occupational History  . Not on file  Social Needs  . Financial resource strain: Not on file  . Food insecurity:    Worry: Not on file    Inability: Not on file  . Transportation needs:    Medical: Not on file    Non-medical: Not on file  Tobacco Use  . Smoking status: Former Research scientist (life sciences)  . Smokeless  tobacco: Never Used  Substance and Sexual Activity  . Alcohol use: No    Alcohol/week: 0.0 oz  . Drug use: No  . Sexual activity: Never  Lifestyle  . Physical activity:    Days per week: Not on file    Minutes per session: Not on file  . Stress: Not on file  Relationships  . Social connections:    Talks on phone: Not on file    Gets together: Not on file    Attends religious service: Not on file    Active member of club or organization: Not on file    Attends meetings of clubs or organizations: Not on file    Relationship status: Not on file  Other Topics Concern  . Not on file  Social History Narrative  . Not on file   Family History  Problem Relation Age of Onset  . Heart disease Mother   . Diabetes Mother   . Hypertension Mother   . Heart disease Father   . Stroke Father   . Cancer Son        bladder  . COPD Son   . Breast cancer Neg Hx    Allergies  Allergen Reactions  . Codeine Nausea Only    GI upset  .  Cyclobenzaprine     Other reaction(s): Headache  . Methocarbamol     Other reaction(s): Other (See Comments)  . Sulfa Antibiotics Nausea And Vomiting  . Carbamazepine Anxiety  . Gabapentin Anxiety   Prior to Admission medications   Medication Sig Start Date End Date Taking? Authorizing Provider  Acetaminophen (TYLENOL EXTRA STRENGTH PO) Take 1-2 tablets by mouth as needed (PAIN).    Yes [provider]  amLODipine (NORVASC) 10 MG tablet Take 1 tablet (10 mg total) by mouth daily. 11/24/17  Yes Einar Pheasant, MD  aspirin 81 MG tablet Take 81 mg by mouth daily.   Yes [provider]  docusate sodium (COLACE) 100 MG capsule Take 100 mg by mouth daily.   Yes [provider]  Eslicarbazepine Acetate 400 MG TABS Take 1 tablet by mouth 3 (three) times daily.   Yes [provider]  estradiol (ESTRACE VAGINAL) 0.1 MG/GM vaginal cream Apply vaginally once monthly PRN 05/22/16  Yes [provider]  Fe Fum-FePoly-Vit C-Vit  B3 (INTEGRA) 62.5-62.5-40-3 MG CAPS Take one tablet daily Patient taking differently: Take 1 tablet by mouth daily.  02/26/18  Yes Einar Pheasant, MD  Multiple Vitamin (MULTI-VITAMINS) TABS Take 1 tablet by mouth.    Yes [provider]  traMADol (ULTRAM) 50 MG tablet Take 1 tablet (50 mg total) by mouth every 6 (six) hours as needed for severe pain. Patient taking differently: Take 50 mg by mouth 3 (three) times daily.  05/31/18 11/27/18 Yes Vevelyn Francois, NP  triamcinolone cream (KENALOG) 0.1 % Apply 1 application topically 2 (two) times daily. 04/24/18  Yes Einar Pheasant, MD   Dg Chest Portable 1 View  Result Date: 06/03/2018 CLINICAL DATA:  Hip fracture. Former smoker 20 years ago with hypertension. EXAM: PORTABLE CHEST 1 VIEW COMPARISON:  02/03/2015 FINDINGS: Heart is top-normal in size. There is mild-to-moderate aortic atherosclerosis with slight uncoiling of the thoracic aorta. Probable small hiatal hernia accounting for soft tissue density projecting over the cardiac silhouette. Costochondral calcifications are present bilaterally. No pulmonary consolidations suspicious for pneumonia. No effusion or pneumothorax. No pulmonary edema. Partially included left humerus fixation hardware is noted. IMPRESSION: Aortic atherosclerosis.  No active pulmonary disease. Electronically Signed   By: Ashley Royalty M.D.   On: 06/03/2018 18:19   Dg Hip Unilat W Or Wo Pelvis 2-3 Views Right  Result Date: 06/03/2018 CLINICAL DATA:  Patient fell from a chair yesterday and reported no pain in the right hip patella 2 p.m. today. EXAM: DG HIP (WITH OR WITHOUT PELVIS) 2-3V RIGHT COMPARISON:  None. FINDINGS: There is a varus angulated varus angulated transcervical fracture of the right femur with probable slight impaction of the femoral head upon the base of femoral head. Osteoarthritic joint space narrowing is seen of the native right hip. The bony pelvis appears intact without apparent fracture. Partially included  lower lumbar posterior spinal fusion hardware extends to the L5 level bilaterally. There is lower lumbar degenerative disc and facet arthropathy from L4 through S1. No pelvic diastasis. A bipolar left hip arthroplasty also noted with cemented femoral component but without without complicating features. Gluteal enthesopathy is noted off the left greater trochanter. A pessary is in place. IMPRESSION: Acute varus angulated slightly impacted transcervical fracture of the right femur. Electronically Signed   By: Ashley Royalty M.D.   On: 06/03/2018 17:50    Positive ROS: All other systems have been reviewed and were otherwise negative with the exception of those mentioned in the HPI and as  above.  Physical Exam: General: Alert, no acute distress Cardiovascular: No pedal edema Respiratory: No cyanosis, no use of accessory musculature GI: No organomegaly, abdomen is soft and non-tender Skin: No lesions in the area of chief complaint Neurologic: Sensation intact distally Psychiatric: Patient is competent for consent with normal mood and affect Lymphatic: No axillary or cervical lymphadenopathy  MUSCULOSKELETAL: Patient alert and cooperative and fully oriented.  Right leg is shortened and rotated.  There is pain with movement of the hip.  The skin is intact.  Neurovascular status is good.  Left leg is nonpainful.  No other injuries are noted.  Assessment: Displaced subcapital fracture right hip  Plan: Right hip hemiarthroplasty    Park Breed, MD [phone]   06/04/2018 10:05 AM

## 2018-06-04 NOTE — Progress Notes (Signed)
Pt returned to room 159 from PACU. Pt is A&Ox4, VSS; pt can move both feet and has tingling in bilat. LE. Aquacel dressing CDI to right hip, bucks traction and foley in place. IVF infusing, pt and family educated on plan of care.   [place], Jerry Caras

## 2018-06-04 NOTE — Anesthesia Postprocedure Evaluation (Signed)
Anesthesia Post Note  Patient: Denise Barry  Procedure(s) Performed: ARTHROPLASTY BIPOLAR HIP (HEMIARTHROPLASTY) (Right )  Patient location during evaluation: PACU Anesthesia Type: Spinal Level of consciousness: oriented and awake and alert Pain management: pain level controlled Vital Signs Assessment: post-procedure vital signs reviewed and stable Respiratory status: spontaneous breathing, respiratory function stable and patient connected to nasal cannula oxygen Cardiovascular status: blood pressure returned to baseline and stable Postop Assessment: no headache, no backache and no apparent nausea or vomiting Anesthetic complications: no     Last Vitals:  Vitals:   06/04/18 1510 06/04/18 1537  BP: 128/62 (!) 122/59  Pulse: 92 68  Resp: 16   Temp: 37 C 36.4 C  SpO2: 94% 90%    Last Pain:  Vitals:   06/04/18 1537  TempSrc: Oral  PainSc:                  [name]

## 2018-06-04 NOTE — Op Note (Signed)
06/04/2018  2:36 PM  PATIENT:  Denise Barry   MRN: [id number]  PRE-OPERATIVE DIAGNOSIS:  Displaced Subcapital fracture right hip   POST-OPERATIVE DIAGNOSIS: Same  PROCEDURE: Right   hip hemiarthroplasty with Stryker Accolade prosthesis  PREOPERATIVE INDICATIONS:  Denise Barry is an 84 y.o. female who was admitted 06/03/2018 with a diagnosis of displaced subcapital fracture of the hip and elected for surgical management.  The risks benefits and alternatives were discussed with the patient including but not limited to the risks of nonoperative treatment, versus surgical intervention including infection, bleeding, nerve injury, periprosthetic fracture, the need for revision surgery, dislocation, leg length discrepancy, blood clots, cardiopulmonary complications, morbidity, mortality, among others, and they were willing to proceed.  Predicted outcome is good, although there will be at least a six to nine month expected recovery.     SURGEON:  Earnestine Leys, MD  ASST:    ANESTHESIA: Spinal    COMPLICATIONS:  None.   EBL: 50 cc    COMPONENTS:  Stryker Accolade Femoral Fracture stem size #5,   and a size   9 mm  fracture head unipolar hip ball with   +4 mm  neck length.    PROCEDURE IN DETAIL: The patient was met in the holding area and identified.  The appropriate hip  was marked at the operative site. The patient was then transported to the OR and  placed under general anesthesia.  At that point, the patient was  placed in the lateral decubitus position with the operative side up and  secured to the operating room table and all bony prominences padded.     The operative lower extremity was prepped from the iliac crest to the toes.  Sterile draping was performed.  Time out was performed prior to incision.      A routine posterolateral approach was utilized via sharp dissection  carried down to the subcutaneous tissue.  Gross bleeders were Bovie  coagulated.  The iliotibial band  was identified and incised  along the length of the skin incision.  Self-retaining retractors were  inserted.  With the hip internally rotated, the short external rotators  were identified. The piriformis was tagged and the hip capsule released in a T-type fashion.  The femoral neck was exposed, and I resected the femoral neck using the appropriate jig. This was performed at approximately a thumb's breadth above the lesser trochanter.    I then exposed the deep acetabulum, cleared out any tissue including the ligamentum teres.    I then prepared the proximal femur using the cookie-cutter, the lateralizing reamer, and then sequentially broached.  A trial stem   was  utilized along with a unipolar head and neck.  I reduced the hip and it was found to have excellent stability with functional range of motion. Leg lengths were equal.  The trial components were then removed.   The same size Accolade femoral stem was then inserted and was very stable.  The Unitrax head and neck as trialed were inserted as well.     The hip was then reduced and taken through functional range of motion and found to have excellent stability. Leg lengths were restored.     I closed the T in the capsule with #2 Ticron as well as the short external rotators. A hemovac was inserted.    I then irrigated the hip copiously again with pulse lavage, and repaired the fascia with #2 Quill and the subcutaneous layer with #0 Quill. Sponge and  needle counts were correct. Dry sterile Aquacell was applied.   The patient was then awakened and returned to PACU in stable and satisfactory condition. There were no complications.  Denise Breed, MD Orthopedic Surgeon [phone]   06/04/2018 2:36 PM

## 2018-06-04 NOTE — Clinical Social Work Note (Signed)
Clinical Social Work Assessment  Patient Details  Name: Denise Barry MRN: [id number] Date of Birth: July 13, 1934  Date of referral:  06/04/18               Reason for consult:  Facility Placement                Permission sought to share information with:  Chartered certified accountant granted to share information::  Yes, Verbal Permission Granted  Name::      [place]::   [place]   Relationship::     Contact Information:     Housing/Transportation Living arrangements for the past 2 months:  [place] of Information:  Patient, Adult Children Patient Interpreter Needed:  None Criminal Activity/Legal Involvement Pertinent to Current Situation/Hospitalization:  No - Comment as needed Significant Relationships:  Adult Children Lives with:  Self Do you feel safe going back to the place where you live?  Yes Need for family participation in patient care:  Yes (Comment)  Care giving concerns:  Patient lives in [place] alone.    Social Worker assessment / plan:  Holiday representative ([place]) reviewed chart and noted that patient has a hip fracture. Surgery and PT are pending. CSW met with patient and her son Denise Barry and daughter Denise Barry were at bedside. Per patient she has 2 sons and 1 daughter and lives alone in [place]. Patient reported that she would like to go to [facility] for short term rehab after surgery. Per patient she has been at [facility] 1.5 years ago. CSW explained that PT will evaluate patient and make a recommendation of home health or SNF. CSW also explained that medicare requires a 3 night qualifying inpatient stay in the hospital in order to pay for SNF. Patient was admitted to inpatient 06/03/18. Medicare bundle case manager aware of above. FL2 complete and faxed out.   Per [facility] admissions coordinator at [facility] he will accept patient. CSW will continue to follow and assist as needed.     Employment status:   Retired Forensic scientist:  Medicare PT Recommendations:  Not assessed at this time [place] / Referral to community resources:  [place]  Patient/Family's Response to care:  Patient prefers to go to [facility].   Patient/Family's Understanding of and Emotional Response to Diagnosis, Current Treatment, and Prognosis:  Patient was very pleasant and thanked CSW for assistance.   Emotional Assessment Appearance:  Appears stated age Attitude/Demeanor/Rapport:    Affect (typically observed):  Accepting, Adaptable, Pleasant Orientation:  Oriented to Self, Oriented to Place, Oriented to  Time, Oriented to Situation Alcohol / Substance use:  Not Applicable Psych involvement (Current and /or in the community):  No (Comment)  Discharge Needs  Concerns to be addressed:  Discharge Planning Concerns Readmission within the last 30 days:  No Current discharge risk:  Dependent with Mobility Barriers to Discharge:  Continued Medical Work up   [employer], Veronia Beets, LCSW 06/04/2018, 2:00 PM

## 2018-06-04 NOTE — Progress Notes (Signed)
Patient ID: Allan Bacigalupi, female   DOB: May 01, 1934, 84 y.o.   MRN: [id number]  [facility] PROGRESS NOTE  Shermika Balthaser BDZ:[id number] DOB: 14-Aug-1934 DOA: 06/03/2018 PCP: Einar Pheasant, MD  HPI/Subjective: Patient had a fall on Saturday and out of her chair.  She has been able to get up and walk around but yesterday she could hardly put weight on her leg and was very weak there.  Objective: Vitals:   06/03/18 2037 06/04/18 0515  BP: (!) 160/76 (!) 158/80  Pulse: (!) 104 86  Resp:  20  Temp: 98.1 F (36.7 C) 98.3 F (36.8 C)  SpO2: 97%     Filed Weights   06/03/18 1624  Weight: 70.3 kg (155 lb)    ROS: Review of Systems  Constitutional: Negative for chills and fever.  Eyes: Negative for blurred vision.  Respiratory: Negative for cough and shortness of breath.   Cardiovascular: Negative for chest pain.  Gastrointestinal: Negative for abdominal pain, constipation, diarrhea, nausea and vomiting.  Genitourinary: Negative for dysuria.  Musculoskeletal: Positive for joint pain.  Neurological: Negative for dizziness and headaches.   Exam: Physical Exam  Constitutional: She is oriented to person, place, and time.  HENT:  Nose: No mucosal edema.  Mouth/Throat: No oropharyngeal exudate or posterior oropharyngeal edema.  Eyes: Pupils are equal, round, and reactive to light. Conjunctivae, EOM and lids are normal.  Neck: No JVD present. Carotid bruit is not present. No edema present. No thyroid mass and no thyromegaly present.  Cardiovascular: S1 normal and S2 normal. Exam reveals no gallop.  No murmur heard. Pulses:      Dorsalis pedis pulses are 2+ on the right side, and 2+ on the left side.  Respiratory: No respiratory distress. She has no wheezes. She has no rhonchi. She has no rales.  GI: Soft. Bowel sounds are normal. There is no tenderness.  Musculoskeletal:       Right ankle: She exhibits no swelling.       Left ankle: She exhibits no swelling.   Right leg in traction  Lymphadenopathy:    She has no cervical adenopathy.  Neurological: She is alert and oriented to person, place, and time. No cranial nerve deficit.  Skin: Skin is warm. No rash noted. Nails show no clubbing.  Psychiatric: She has a normal mood and affect.      Data Reviewed: Basic Metabolic Panel: Recent Labs  Lab 06/03/18 1709 06/04/18 0448  NA 138 137  K 3.3* 3.5  CL 101 103  CO2 27 26  GLUCOSE 144* 110*  BUN 18 11  CREATININE 0.78 0.64  CALCIUM 8.9 8.4*   CBC: Recent Labs  Lab 06/03/18 1709 06/04/18 0448  WBC 14.3* 8.9  NEUTROABS 12.8*  --   HGB 11.3* 10.8*  HCT 34.6* 32.5*  MCV 83.1 83.0  PLT 322 298     Recent Results (from the past 240 hour(s))  Surgical PCR screen     Status: Abnormal   Collection Time: 06/03/18  9:14 PM  Result Value Ref Range Status   MRSA, PCR NEGATIVE NEGATIVE Final   Staphylococcus aureus POSITIVE (A) NEGATIVE Final    Comment: (NOTE) The Xpert SA Assay (FDA approved for NASAL specimens in patients 30 years of age and older), is one component of a comprehensive surveillance program. It is not intended to diagnose infection nor to guide or monitor treatment. Performed at [facility], [address] [place], [place] [address]      Studies: Dg  Chest Portable 1 View  Result Date: 06/03/2018 CLINICAL DATA:  Hip fracture. Former smoker 20 years ago with hypertension. EXAM: PORTABLE CHEST 1 VIEW COMPARISON:  02/03/2015 FINDINGS: Heart is top-normal in size. There is mild-to-moderate aortic atherosclerosis with slight uncoiling of the thoracic aorta. Probable small hiatal hernia accounting for soft tissue density projecting over the cardiac silhouette. Costochondral calcifications are present bilaterally. No pulmonary consolidations suspicious for pneumonia. No effusion or pneumothorax. No pulmonary edema. Partially included left humerus fixation hardware is noted. IMPRESSION: Aortic atherosclerosis.  No  active pulmonary disease. Electronically Signed   By: Ashley Royalty M.D.   On: 06/03/2018 18:19   Dg Hip Unilat W Or Wo Pelvis 2-3 Views Right  Result Date: 06/03/2018 CLINICAL DATA:  Patient fell from a chair yesterday and reported no pain in the right hip patella 2 p.m. today. EXAM: DG HIP (WITH OR WITHOUT PELVIS) 2-3V RIGHT COMPARISON:  None. FINDINGS: There is a varus angulated varus angulated transcervical fracture of the right femur with probable slight impaction of the femoral head upon the base of femoral head. Osteoarthritic joint space narrowing is seen of the native right hip. The bony pelvis appears intact without apparent fracture. Partially included lower lumbar posterior spinal fusion hardware extends to the L5 level bilaterally. There is lower lumbar degenerative disc and facet arthropathy from L4 through S1. No pelvic diastasis. A bipolar left hip arthroplasty also noted with cemented femoral component but without without complicating features. Gluteal enthesopathy is noted off the left greater trochanter. A pessary is in place. IMPRESSION: Acute varus angulated slightly impacted transcervical fracture of the right femur. Electronically Signed   By: Ashley Royalty M.D.   On: 06/03/2018 17:50    Scheduled Meds: . amLODipine  10 mg Oral Daily  . [START ON 06/05/2018] aspirin EC  81 mg Oral Daily  . Chlorhexidine Gluconate Cloth  6 each Topical Daily  . docusate sodium  100 mg Oral Daily  . Eslicarbazepine Acetate  1 tablet Oral TID  . ferrous [id number] C-folic acid  1 capsule Oral Daily  . multivitamin with minerals  1 tablet Oral QPC supper  . mupirocin ointment  1 application Nasal BID  . potassium chloride  40 mEq Oral BID   Continuous Infusions: . sodium chloride 75 mL/hr at 06/03/18 2119  .  ceFAZolin (ANCEF) IV    . clindamycin (CLEOCIN) IV      Assessment/Plan:  1. Preoperative consultation for right leg femur fracture.  No contraindications to surgery at this  time 2. Right femur fracture closed initial consultation.  Awaiting orthopedic surgery consultation for surgery. 3. Essential hypertension on Norvasc 4. Hypokalemia on potassium supplementation 5. History of trigeminal neuralgia on chronic pain medication  Code Status:     Code Status Orders  (From admission, onward)        Start     Ordered   06/03/18 2032  Do not attempt resuscitation (DNR)  Continuous    Question Answer Comment  In the event of cardiac or respiratory ARREST Do not call a "code blue"   In the event of cardiac or respiratory ARREST Do not perform Intubation, CPR, defibrillation or ACLS   In the event of cardiac or respiratory ARREST Use medication by any route, position, wound care, and other measures to relive pain and suffering. May use oxygen, suction and manual treatment of airway obstruction as needed for comfort.      06/03/18 2031    Code Status History    Date  Active Date Inactive Code Status Order ID Comments User Context   10/05/2017 2135 10/11/2017 1902 Full Code [id number]  Clayburn Pert, MD Inpatient    Advance Directive Documentation     Most Recent Value  Type of Advance Directive  Healthcare Power of [place]  Pre-existing out of facility DNR order (yellow form or pink MOST form)  -  "MOST" Form in Place?  -     Disposition Plan:  likely will need rehab in 2 to 3 days postoperatively   Consultants:  Orthopedic surgery  Time spent: 27 minutes  [place]

## 2018-06-04 NOTE — Transfer of Care (Signed)
Immediate Anesthesia Transfer of Care Note  Patient: Denise Barry  Procedure(s) Performed: ARTHROPLASTY BIPOLAR HIP (HEMIARTHROPLASTY) (Right )  Patient Location: PACU  Anesthesia Type:Spinal  Level of Consciousness: awake  Airway & Oxygen Therapy: Patient Spontanous Breathing and Patient connected to face mask oxygen  Post-op Assessment: Report given to RN and Post -op Vital signs reviewed and stable  Post vital signs: Reviewed  Last Vitals:  Vitals Value Taken Time  BP 115/64 06/04/2018  2:40 PM  Temp    Pulse 86 06/04/2018  2:40 PM  Resp 14 06/04/2018  2:40 PM  SpO2 100 % 06/04/2018  2:40 PM    Last Pain:  Vitals:   06/04/18 1109  TempSrc: Tympanic  PainSc: 0-No pain      Patients Stated Pain Goal: 2 (28/83/37 4451)  Complications: No apparent anesthesia complications

## 2018-06-05 LAB — BASIC METABOLIC PANEL
ANION GAP: 4 — AB (ref 5–15)
BUN: 15 mg/dL (ref 8–23)
CALCIUM: 8.1 mg/dL — AB (ref 8.9–10.3)
CO2: 28 mmol/L (ref 22–32)
Chloride: 103 mmol/L (ref 98–111)
Creatinine, Ser: 0.97 mg/dL (ref 0.44–1.00)
GFR, EST NON AFRICAN AMERICAN: 53 mL/min — AB (ref >60)
Glucose, Bld: 98 mg/dL (ref 70–99)
POTASSIUM: 4.1 mmol/L (ref 3.5–5.1)
Sodium: 135 mmol/L (ref 135–145)

## 2018-06-05 LAB — CBC
HEMATOCRIT: 26.9 % — AB (ref 35.0–47.0)
Hemoglobin: 9 g/dL — ABNORMAL LOW (ref 12.0–16.0)
MCH: 28 pg (ref 26.0–34.0)
MCHC: 33.4 g/dL (ref 32.0–36.0)
MCV: 83.8 fL (ref 80.0–100.0)
PLATELETS: 231 10*3/uL (ref 150–440)
RBC: 3.21 MIL/uL — ABNORMAL LOW (ref 3.80–5.20)
RDW: 19.2 % — AB (ref 11.5–14.5)
WBC: 6.9 10*3/uL (ref 3.6–11.0)

## 2018-06-05 MED ORDER — SODIUM CHLORIDE 0.9 % IV BOLUS
500.0000 mL | Freq: Once | INTRAVENOUS | Status: AC
  Administered 2018-06-05: 500 mL via INTRAVENOUS

## 2018-06-05 MED ORDER — ENOXAPARIN SODIUM 40 MG/0.4ML ~~LOC~~ SOLN
40.0000 mg | SUBCUTANEOUS | Status: DC
  Administered 2018-06-06: 40 mg via SUBCUTANEOUS
  Filled 2018-06-05: qty 0.4

## 2018-06-05 MED ORDER — POLYETHYLENE GLYCOL 3350 17 G PO PACK
17.0000 g | PACK | Freq: Every day | ORAL | Status: DC
  Administered 2018-06-05: 17 g via ORAL
  Filled 2018-06-05 (×2): qty 1

## 2018-06-05 NOTE — Progress Notes (Signed)
Physical Therapy Treatment Patient Details Name: Denise Barry MRN: [id number] DOB: 1933/11/12 Today's Date: 06/05/2018    History of Present Illness 84 y.o. female admitted post fall 06/03/18, s/p R hip hemiarthroplasty,  with a known history of diverticulitis, hypercholesterolemia, hypertension, osteoarthritis, varicose vein    PT Comments    Patient alert and agreeable to PT at start of session, up in chair. Patient able to perform exercises with just verbal cues, without increase in pain. Patient still needed minAx1 to successfully complete sit to stand transfer. Improved adherence to PWB status during ambulation, ambulated ~35ft with RW CGA/minAx1. Though patient did have two instances of LOB that needed minAx1 from PT to correct. Patient in bed at end of session with all needs in reach. The patient would benefit from further skilled PT to address changes from PLOF.    Follow Up Recommendations  SNF     Equipment Recommendations  None recommended by PT(Per patient states she has 4WW, RW, and bedside commode at home)    Recommendations for Other Services OT consult     Precautions / Restrictions Precautions Precautions: Fall Restrictions Weight Bearing Restrictions: Yes RLE Weight Bearing: Partial weight bearing    Mobility  Bed Mobility Overal bed mobility: Needs Assistance Bed Mobility: Sit to Supine     Supine to sit: Min assist     General bed mobility comments: MinAx1 for RLE management  Transfers Overall transfer level: Needs assistance Equipment used: Rolling walker (2 wheeled) Transfers: Sit to/from Stand Sit to Stand: Min assist         General transfer comment: Patient needed minAx1 to achieve full standing position, and to transfer R hand from chair to RW.   Ambulation/Gait Ambulation/Gait assistance: Min guard;Min assist Gait Distance (Feet): 5 Feet Assistive device: Rolling walker (2 wheeled)       General Gait Details: Patient  demonstrated improved adherence to PWB status and AD management. verbal cues/demo from PT needed. Patient had 2 instances of LOB posteriorly, needed minAx1 from PT to correct   Stairs             Wheelchair Mobility    Modified Rankin (Stroke Patients Only)       Balance Overall balance assessment: Needs assistance Sitting-balance support: Feet supported Sitting balance-Leahy Scale: Fair       Standing balance-Leahy Scale: Poor                              Cognition Arousal/Alertness: Awake/alert Behavior During Therapy: WFL for tasks assessed/performed Overall Cognitive Status: Within Functional Limits for tasks assessed                                        Exercises General Exercises - Lower Extremity Ankle Circles/Pumps: AROM;Both;20 reps Quad Sets: AROM;20 reps;Right Gluteal Sets: AROM;Both;20 reps Short Arc Quad: AROM;Right;20 reps Long Arc Quad: AROM;Right;20 reps Hip ABduction/ADduction: AROM;Right;20 reps Straight Leg Raises: AROM;Right;20 reps    General Comments        Pertinent Vitals/Pain Pain Assessment: No/denies pain    Home Living                      Prior Function            PT Goals (current goals can now be found in the care plan section) Acute Rehab PT  Goals Patient Stated Goal: Patient wants to return home, but safely PT Goal Formulation: With patient Time For Goal Achievement: 06/19/18 Potential to Achieve Goals: Good Progress towards PT goals: Progressing toward goals    Frequency    BID      PT Plan Current plan remains appropriate    Co-evaluation              AM-PAC PT "6 Clicks" Daily Activity  Outcome Measure  Difficulty turning over in bed (including adjusting bedclothes, sheets and blankets)?: A Little Difficulty moving from lying on back to sitting on the side of the bed? : Unable Difficulty sitting down on and standing up from a chair with arms (e.g.,  wheelchair, bedside commode, etc,.)?: Unable Help needed moving to and from a bed to chair (including a wheelchair)?: A Lot Help needed walking in hospital room?: A Lot Help needed climbing 3-5 steps with a railing? : Total 6 Click Score: 10    End of Session Equipment Utilized During Treatment: Gait belt Activity Tolerance: Patient limited by fatigue;Patient tolerated treatment well Patient left: Other (comment);with call bell/phone within reach;with SCD's reapplied;in bed;with bed alarm set Nurse Communication: Mobility status PT Visit Diagnosis: Other abnormalities of gait and mobility (R26.89);History of falling (Z91.81);Muscle weakness (generalized) (M62.81);Unsteadiness on feet (R26.81)     Time: 9450-3888 PT Time Calculation (min) (ACUTE ONLY): 27 min  Charges:  $Therapeutic Exercise: 8-22 mins $Therapeutic Activity: 8-22 mins                     Lieutenant Diego PT, DPT 1:39 PM,06/05/18 [phone]

## 2018-06-05 NOTE — Progress Notes (Signed)
Pt has only voided 50 cc this afternoon since foley was removed. Pt sat on BSC and voided 50 ccs, bladder scan right after showed 74 cc. IVF were stopped at 752 this morning per order. Dr. Leslye Peer notified, received order for 500 cc fluid bolus and to encourage pt to eat and drink.   [place], Jerry Caras

## 2018-06-05 NOTE — Progress Notes (Signed)
Patient ID: Denise Barry, female   DOB: April 22, 1934, 84 y.o.   MRN: [id number]  [facility] PROGRESS NOTE  Lorissa Kishbaugh SEG:[id number] DOB: [birth year] DOA: 06/03/2018 PCP: Einar Pheasant, MD  HPI/Subjective: Patient feeling okay.  Has some pain in the hip.  No chest pain or shortness of breath.  Objective: Vitals:   06/05/18 0801 06/05/18 1100  BP: 117/63 134/61  Pulse: 74   Resp:    Temp: 97.9 F (36.6 C) 98.1 F (36.7 C)  SpO2: 94% 98%    Filed Weights   06/03/18 1624 06/04/18 1109  Weight: 70.3 kg (155 lb) 70.3 kg (155 lb)    ROS: Review of Systems  Constitutional: Negative for chills and fever.  Eyes: Negative for blurred vision.  Respiratory: Negative for cough and shortness of breath.   Cardiovascular: Negative for chest pain.  Gastrointestinal: Negative for abdominal pain, constipation, diarrhea, nausea and vomiting.  Genitourinary: Negative for dysuria.  Musculoskeletal: Positive for joint pain.  Neurological: Negative for dizziness and headaches.   Exam: Physical Exam  Constitutional: She is oriented to person, place, and time.  HENT:  Nose: No mucosal edema.  Mouth/Throat: No oropharyngeal exudate or posterior oropharyngeal edema.  Eyes: Pupils are equal, round, and reactive to light. Conjunctivae, EOM and lids are normal.  Neck: No JVD present. Carotid bruit is not present. No edema present. No thyroid mass and no thyromegaly present.  Cardiovascular: S1 normal and S2 normal. Exam reveals no gallop.  No murmur heard. Pulses:      Dorsalis pedis pulses are 2+ on the right side, and 2+ on the left side.  Respiratory: No respiratory distress. She has no wheezes. She has no rhonchi. She has no rales.  GI: Soft. Bowel sounds are normal. There is no tenderness.  Musculoskeletal:       Right ankle: She exhibits no swelling.       Left ankle: She exhibits no swelling.  Lymphadenopathy:    She has no cervical adenopathy.  Neurological: She  is alert and oriented to person, place, and time. No cranial nerve deficit.  Skin: Skin is warm. No rash noted. Nails show no clubbing.  Psychiatric: She has a normal mood and affect.      Data Reviewed: Basic Metabolic Panel: Recent Labs  Lab 06/03/18 1709 06/04/18 0448 06/05/18 0459  NA 138 137 135  K 3.3* 3.5 4.1  CL 101 103 103  CO2 27 26 28   GLUCOSE 144* 110* 98  BUN 18 11 15   CREATININE 0.78 0.64 0.97  CALCIUM 8.9 8.4* 8.1*   CBC: Recent Labs  Lab 06/03/18 1709 06/04/18 0448 06/05/18 0459  WBC 14.3* 8.9 6.9  NEUTROABS 12.8*  --   --   HGB 11.3* 10.8* 9.0*  HCT 34.6* 32.5* 26.9*  MCV 83.1 83.0 83.8  PLT 322 298 231     Recent Results (from the past 240 hour(s))  Surgical PCR screen     Status: Abnormal   Collection Time: 06/03/18  9:14 PM  Result Value Ref Range Status   MRSA, PCR NEGATIVE NEGATIVE Final   Staphylococcus aureus POSITIVE (A) NEGATIVE Final    Comment: (NOTE) The Xpert SA Assay (FDA approved for NASAL specimens in patients 61 years of age and older), is one component of a comprehensive surveillance program. It is not intended to diagnose infection nor to guide or monitor treatment. Performed at [facility], [address], [place], [place] [address]      Studies: Dg Chest  Portable 1 View  Result Date: 06/03/2018 CLINICAL DATA:  Hip fracture. Former smoker 20 years ago with hypertension. EXAM: PORTABLE CHEST 1 VIEW COMPARISON:  02/03/2015 FINDINGS: Heart is top-normal in size. There is mild-to-moderate aortic atherosclerosis with slight uncoiling of the thoracic aorta. Probable small hiatal hernia accounting for soft tissue density projecting over the cardiac silhouette. Costochondral calcifications are present bilaterally. No pulmonary consolidations suspicious for pneumonia. No effusion or pneumothorax. No pulmonary edema. Partially included left humerus fixation hardware is noted. IMPRESSION: Aortic atherosclerosis.  No active  pulmonary disease. Electronically Signed   By: Ashley Royalty M.D.   On: 06/03/2018 18:19   Dg Hip Port Unilat With Pelvis 1v Right  Result Date: 06/04/2018 CLINICAL DATA:  Post-op right hip hemiarthroplasty. EXAM: DG HIP (WITH OR WITHOUT PELVIS) 1V PORT RIGHT COMPARISON:  Plain film dated 06/03/2018. FINDINGS: New RIGHT hip arthroplasty hardware appears appropriately positioned. Expected postsurgical changes within the overlying soft tissues. LEFT hip arthroplasty hardware appears stable in position. IMPRESSION: Status post RIGHT hip arthroplasty. No evidence of surgical complicating feature. Electronically Signed   By: Franki Cabot M.D.   On: 06/04/2018 15:15   Dg Hip Unilat W Or Wo Pelvis 2-3 Views Right  Result Date: 06/03/2018 CLINICAL DATA:  Patient fell from a chair yesterday and reported no pain in the right hip patella 2 p.m. today. EXAM: DG HIP (WITH OR WITHOUT PELVIS) 2-3V RIGHT COMPARISON:  None. FINDINGS: There is a varus angulated varus angulated transcervical fracture of the right femur with probable slight impaction of the femoral head upon the base of femoral head. Osteoarthritic joint space narrowing is seen of the native right hip. The bony pelvis appears intact without apparent fracture. Partially included lower lumbar posterior spinal fusion hardware extends to the L5 level bilaterally. There is lower lumbar degenerative disc and facet arthropathy from L4 through S1. No pelvic diastasis. A bipolar left hip arthroplasty also noted with cemented femoral component but without without complicating features. Gluteal enthesopathy is noted off the left greater trochanter. A pessary is in place. IMPRESSION: Acute varus angulated slightly impacted transcervical fracture of the right femur. Electronically Signed   By: Ashley Royalty M.D.   On: 06/03/2018 17:50    Scheduled Meds: . amLODipine  10 mg Oral Daily  . celecoxib  200 mg Oral BID  . Chlorhexidine Gluconate Cloth  6 each Topical Daily  .  docusate sodium  100 mg Oral BID  . [START ON 06/06/2018] enoxaparin (LOVENOX) injection  40 mg Subcutaneous Q24H  . Eslicarbazepine Acetate  1 tablet Oral TID  . ferrous [id number] C-folic acid  1 capsule Oral Daily  . ferrous sulfate  325 mg Oral Q breakfast  . gabapentin  300 mg Oral TID  . multivitamin with minerals  1 tablet Oral QPC supper  . mupirocin ointment  1 application Nasal BID  . polyethylene glycol  17 g Oral Daily  . potassium chloride  40 mEq Oral BID   Continuous Infusions:   Assessment/Plan:  1. Right femoral neck fracture requiring operative repair.   currently on Lovenox for DVT prophylaxis.  Dr. Sabra Heck recommends aspirin 325 mg twice a day for 6 weeks for DVT prophylaxis as outpatient.  Pain control.  Hopefully will be able to go out to rehab tomorrow. 2. Essential hypertension on Norvasc 3. Hypokalemia on potassium supplementation 4. History of trigeminal neuralgia on chronic pain medication 5. And MiraLAX to try to get a bowel movement.  Code Status:     Code  Status Orders  (From admission, onward)        Start     Ordered   06/03/18 2032  Do not attempt resuscitation (DNR)  Continuous    Question Answer Comment  In the event of cardiac or respiratory ARREST Do not call a "code blue"   In the event of cardiac or respiratory ARREST Do not perform Intubation, CPR, defibrillation or ACLS   In the event of cardiac or respiratory ARREST Use medication by any route, position, wound care, and other measures to relive pain and suffering. May use oxygen, suction and manual treatment of airway obstruction as needed for comfort.      06/03/18 2031    Code Status History    Date Active Date Inactive Code Status Order ID Comments User Context   10/05/2017 2135 10/11/2017 1902 Full Code [id number]  Clayburn Pert, MD Inpatient    Advance Directive Documentation     Most Recent Value  Type of Advance Directive  Healthcare Power of [place]  Pre-existing  out of facility DNR order (yellow form or pink MOST form)  -  "MOST" Form in Place?  -     Disposition Plan:   hopefully out to rehab tomorrow  Consultants:  Orthopedic surgery  Time spent: 25 minutes  [place]

## 2018-06-05 NOTE — Progress Notes (Signed)
Subjective: 1 Day Post-Op Procedure(s) (LRB): ARTHROPLASTY BIPOLAR HIP (HEMIARTHROPLASTY) (Right)    Patient reports pain as mild.  Objective: Sitting in chair and eating lunch well.  Fully oriented and comfortable.  Right hip stable and leg lengths equal.  VITALS:   Vitals:   06/05/18 0801 06/05/18 1100  BP: 117/63 134/61  Pulse: 74   Resp:    Temp: 97.9 F (36.6 C) 98.1 F (36.7 C)  SpO2: 94% 98%    Neurologically intact ABD soft Neurovascular intact Sensation intact distally Intact pulses distally Dorsiflexion/Plantar flexion intact Incision: scant drainage  LABS Recent Labs    06/03/18 1709 06/04/18 0448 06/05/18 0459  HGB 11.3* 10.8* 9.0*  HCT 34.6* 32.5* 26.9*  WBC 14.3* 8.9 6.9  PLT 322 298 231    Recent Labs    06/03/18 1709 06/04/18 0448 06/05/18 0459  NA 138 137 135  K 3.3* 3.5 4.1  BUN 18 11 15   CREATININE 0.78 0.64 0.97  GLUCOSE 144* 110* 98    Recent Labs    06/03/18 2108  INR 1.05     Assessment/Plan: 1 Day Post-Op Procedure(s) (LRB): ARTHROPLASTY BIPOLAR HIP (HEMIARTHROPLASTY) (Right)   Advance diet Up with therapy D/C IV fluids Discharge to SNF tomorrow hopefully.   Partial weightbearing with walker on right leg  Enteric-coated aspirin 325 mg twice daily for 6 weeks upon discharge  Return to clinic 2 weeks for exam and x-ray

## 2018-06-05 NOTE — Progress Notes (Signed)
Lovenox increased to 40mg  daily due to crcl >37ml/min and TBW >45kg  Melissa D Maccia, Pharm.D, BCPS Clinical Pharmacist

## 2018-06-05 NOTE — Evaluation (Signed)
Physical Therapy Evaluation Patient Details Name: Denise Barry MRN: [id number] DOB: 10-09-1934 Today's Date: 06/05/2018   History of Present Illness  84 y.o. female admitted post fall 06/03/18, s/p R hip hemiarthroplasty,  with a known history of diverticulitis, hypercholesterolemia, hypertension, osteoarthritis, varicose vein  Clinical Impression  Patient A&Ox4 at start of session, no complaints of R hip pain. Per patient, she lives in a 1 story condo alone with 1 STE with no rails, ambulates with 4WW in house, independent for ADLs. Family/aide assists with cooking, cleaning, grocery shopping, and driving. Patient does report she goes to church and has driven twice recently with supervision.  Patient demonstrates bed mobility with minAx1, transfers with minAx1 and RW, and CGA/minAx1 to ambulate to chair. Ambulated with decreased speed, unsteadiness, maximum cues for step pattern. PT and patient spent extensive time reviewing precautions including PWB status. Patient unable to adhere to [facility] while ambulating despite maximum multimodal cues. The patient needed assistance with sequencing of movements/techniques as well as AD management. The patient would benefit from further skilled PT to address these changes from PLOF including decreased strength, endurance, activity tolerance, gait and balance as well as continued education of precautions.      Follow Up Recommendations SNF    Equipment Recommendations  None recommended by PT(Patient has 4WW and RW at home)    Recommendations for Other Services OT consult     Precautions / Restrictions Precautions Precautions: Fall Restrictions Weight Bearing Restrictions: Yes RLE Weight Bearing: Partial weight bearing      Mobility  Bed Mobility Overal bed mobility: Needs Assistance Bed Mobility: Supine to Sit     Supine to sit: Min assist     General bed mobility comments: verbal/tactile cues needed throughout transfer, minAx1 for LE  management as well as trunk elevation.  Transfers Overall transfer level: Needs assistance Equipment used: Rolling walker (2 wheeled) Transfers: Sit to/from Stand Sit to Stand: Min assist;From elevated surface         General transfer comment: Patient had difficulty with immediate standing balance, minAx1 for patient to transfer hands from bed rails to walker  Ambulation/Gait Ambulation/Gait assistance: Min guard;Min assist Gait Distance (Feet): 3 Feet Assistive device: Rolling walker (2 wheeled)       General Gait Details: Difficulty adhereing to PWB status. Extensive verbal/tactile cues for AD management, sequencing of movements/technique. Decreased speed  Stairs            Wheelchair Mobility    Modified Rankin (Stroke Patients Only)       Balance Overall balance assessment: Needs assistance Sitting-balance support: Feet supported Sitting balance-Leahy Scale: Fair       Standing balance-Leahy Scale: Poor                               Pertinent Vitals/Pain Pain Assessment: No/denies pain    Home Living Family/patient expects to be discharged to:: Private residence Living Arrangements: Alone Available Help at Discharge: Available PRN/intermittently;Family Type of Home: House Home Access: Stairs to enter Entrance Stairs-Rails: None Entrance Stairs-Number of Steps: 1 Home Layout: One level Home Equipment: Shower seat;Grab bars - tub/shower;Bedside commode;Walker - 4 wheels;Walker - 2 wheels      Prior Function Level of Independence: Needs assistance   Gait / Transfers Assistance Needed: Household ambulation with rollator, does attend church, out to lunch with help  ADL's / Homemaking Assistance Needed: has assistance for cooking/cleaning, driving a bit , attends church  Comments:  Patient reports fall prior to admission.     Hand Dominance   Dominant Hand: Right    Extremity/Trunk Assessment   Upper Extremity Assessment Upper  Extremity Assessment: Generalized weakness;Overall [facility] for tasks assessed    Lower Extremity Assessment Lower Extremity Assessment: RLE deficits/detail;LLE deficits/detail RLE Deficits / Details: patient able to perform SLR, SAQ, AROM LLE Deficits / Details: at least 3+/5       Communication   Communication: No difficulties  Cognition Arousal/Alertness: Awake/alert Behavior During Therapy: WFL for tasks assessed/performed Overall Cognitive Status: Within Functional Limits for tasks assessed                                        General Comments      Exercises General Exercises - Lower Extremity Ankle Circles/Pumps: AROM;Both;20 reps Quad Sets: AROM;20 reps;Right Short Arc Quad: AROM;Right;20 reps Hip ABduction/ADduction: AROM;Right;20 reps Straight Leg Raises: AROM;Right;20 reps   Assessment/Plan    PT Assessment Patient needs continued PT services  PT Problem List Decreased strength;Decreased range of motion;Decreased knowledge of use of DME;Decreased activity tolerance;Decreased safety awareness;Decreased balance;Decreased knowledge of precautions;Pain;Decreased mobility;Decreased coordination       PT Treatment Interventions DME instruction;Balance training;Gait training;Neuromuscular re-education;Stair training;Functional mobility training;Patient/family education;Therapeutic activities;Therapeutic exercise    PT Goals (Current goals can be found in the Care Plan section)  Acute Rehab PT Goals Patient Stated Goal: Patient wants to return home, but safely PT Goal Formulation: With patient Time For Goal Achievement: 06/19/18 Potential to Achieve Goals: Good    Frequency BID   Barriers to discharge Decreased caregiver support      Co-evaluation               AM-PAC PT "6 Clicks" Daily Activity  Outcome Measure Difficulty turning over in bed (including adjusting bedclothes, sheets and blankets)?: A Little Difficulty moving from lying on  back to sitting on the side of the bed? : Unable Difficulty sitting down on and standing up from a chair with arms (e.g., wheelchair, bedside commode, etc,.)?: Unable Help needed moving to and from a bed to chair (including a wheelchair)?: A Lot Help needed walking in hospital room?: A Lot Help needed climbing 3-5 steps with a railing? : Total 6 Click Score: 10    End of Session Equipment Utilized During Treatment: Gait belt Activity Tolerance: Patient limited by fatigue;Patient tolerated treatment well Patient left: with chair alarm set;in chair;Other (comment);with call bell/phone within reach;with SCD's reapplied(pillow between knees to prevent adduction) Nurse Communication: Mobility status PT Visit Diagnosis: Other abnormalities of gait and mobility (R26.89);History of falling (Z91.81);Muscle weakness (generalized) (M62.81);Unsteadiness on feet (R26.81)    Time: 5956-3875 PT Time Calculation (min) (ACUTE ONLY): 40 min   Charges:   PT Evaluation $PT Eval Low Complexity: 1 Low PT Treatments $Therapeutic Exercise: 8-22 mins $Therapeutic Activity: 8-22 mins       Lieutenant Diego PT, DPT 10:21 AM,06/05/18 [phone]

## 2018-06-06 DIAGNOSIS — N3090 Cystitis, unspecified without hematuria: Secondary | ICD-10-CM | POA: Diagnosis not present

## 2018-06-06 DIAGNOSIS — R4020 Unspecified coma: Secondary | ICD-10-CM | POA: Diagnosis not present

## 2018-06-06 DIAGNOSIS — R918 Other nonspecific abnormal finding of lung field: Secondary | ICD-10-CM | POA: Diagnosis not present

## 2018-06-06 DIAGNOSIS — Z7982 Long term (current) use of aspirin: Secondary | ICD-10-CM | POA: Diagnosis not present

## 2018-06-06 DIAGNOSIS — E860 Dehydration: Secondary | ICD-10-CM | POA: Diagnosis present

## 2018-06-06 DIAGNOSIS — Z01818 Encounter for other preprocedural examination: Secondary | ICD-10-CM | POA: Diagnosis not present

## 2018-06-06 DIAGNOSIS — E7800 Pure hypercholesterolemia, unspecified: Secondary | ICD-10-CM | POA: Diagnosis present

## 2018-06-06 DIAGNOSIS — N179 Acute kidney failure, unspecified: Secondary | ICD-10-CM | POA: Diagnosis present

## 2018-06-06 DIAGNOSIS — M810 Age-related osteoporosis without current pathological fracture: Secondary | ICD-10-CM | POA: Diagnosis not present

## 2018-06-06 DIAGNOSIS — N390 Urinary tract infection, site not specified: Secondary | ICD-10-CM | POA: Diagnosis not present

## 2018-06-06 DIAGNOSIS — R6521 Severe sepsis with septic shock: Secondary | ICD-10-CM | POA: Diagnosis present

## 2018-06-06 DIAGNOSIS — A419 Sepsis, unspecified organism: Secondary | ICD-10-CM | POA: Diagnosis present

## 2018-06-06 DIAGNOSIS — Z9889 Other specified postprocedural states: Secondary | ICD-10-CM | POA: Diagnosis not present

## 2018-06-06 DIAGNOSIS — A0472 Enterocolitis due to Clostridium difficile, not specified as recurrent: Secondary | ICD-10-CM | POA: Diagnosis present

## 2018-06-06 DIAGNOSIS — E872 Acidosis: Secondary | ICD-10-CM | POA: Diagnosis not present

## 2018-06-06 DIAGNOSIS — I10 Essential (primary) hypertension: Secondary | ICD-10-CM | POA: Diagnosis present

## 2018-06-06 DIAGNOSIS — G500 Trigeminal neuralgia: Secondary | ICD-10-CM | POA: Diagnosis present

## 2018-06-06 DIAGNOSIS — J9601 Acute respiratory failure with hypoxia: Secondary | ICD-10-CM | POA: Diagnosis not present

## 2018-06-06 DIAGNOSIS — Z66 Do not resuscitate: Secondary | ICD-10-CM | POA: Diagnosis present

## 2018-06-06 DIAGNOSIS — M1990 Unspecified osteoarthritis, unspecified site: Secondary | ICD-10-CM | POA: Diagnosis present

## 2018-06-06 DIAGNOSIS — I890 Lymphedema, not elsewhere classified: Secondary | ICD-10-CM | POA: Diagnosis not present

## 2018-06-06 DIAGNOSIS — S72001D Fracture of unspecified part of neck of right femur, subsequent encounter for closed fracture with routine healing: Secondary | ICD-10-CM | POA: Diagnosis not present

## 2018-06-06 DIAGNOSIS — D72823 Leukemoid reaction: Secondary | ICD-10-CM | POA: Diagnosis present

## 2018-06-06 DIAGNOSIS — E871 Hypo-osmolality and hyponatremia: Secondary | ICD-10-CM | POA: Diagnosis present

## 2018-06-06 DIAGNOSIS — Z7401 Bed confinement status: Secondary | ICD-10-CM | POA: Diagnosis not present

## 2018-06-06 DIAGNOSIS — G9341 Metabolic encephalopathy: Secondary | ICD-10-CM | POA: Diagnosis present

## 2018-06-06 DIAGNOSIS — K567 Ileus, unspecified: Secondary | ICD-10-CM | POA: Diagnosis present

## 2018-06-06 DIAGNOSIS — J690 Pneumonitis due to inhalation of food and vomit: Secondary | ICD-10-CM | POA: Diagnosis present

## 2018-06-06 DIAGNOSIS — Z4789 Encounter for other orthopedic aftercare: Secondary | ICD-10-CM | POA: Diagnosis not present

## 2018-06-06 DIAGNOSIS — N3000 Acute cystitis without hematuria: Secondary | ICD-10-CM | POA: Diagnosis present

## 2018-06-06 DIAGNOSIS — I872 Venous insufficiency (chronic) (peripheral): Secondary | ICD-10-CM | POA: Diagnosis not present

## 2018-06-06 DIAGNOSIS — S72001A Fracture of unspecified part of neck of right femur, initial encounter for closed fracture: Secondary | ICD-10-CM | POA: Diagnosis not present

## 2018-06-06 DIAGNOSIS — M6281 Muscle weakness (generalized): Secondary | ICD-10-CM | POA: Diagnosis not present

## 2018-06-06 DIAGNOSIS — R262 Difficulty in walking, not elsewhere classified: Secondary | ICD-10-CM | POA: Diagnosis not present

## 2018-06-06 DIAGNOSIS — R296 Repeated falls: Secondary | ICD-10-CM | POA: Diagnosis not present

## 2018-06-06 DIAGNOSIS — Z9851 Tubal ligation status: Secondary | ICD-10-CM | POA: Diagnosis not present

## 2018-06-06 DIAGNOSIS — D62 Acute posthemorrhagic anemia: Secondary | ICD-10-CM | POA: Diagnosis present

## 2018-06-06 DIAGNOSIS — E876 Hypokalemia: Secondary | ICD-10-CM | POA: Diagnosis present

## 2018-06-06 LAB — BASIC METABOLIC PANEL
ANION GAP: 4 — AB (ref 5–15)
BUN: 19 mg/dL (ref 8–23)
CHLORIDE: 104 mmol/L (ref 98–111)
CO2: 26 mmol/L (ref 22–32)
Calcium: 8.1 mg/dL — ABNORMAL LOW (ref 8.9–10.3)
Creatinine, Ser: 0.78 mg/dL (ref 0.44–1.00)
GFR calc non Af Amer: >60 mL/min (ref >60)
Glucose, Bld: 102 mg/dL — ABNORMAL HIGH (ref 70–99)
Potassium: 5 mmol/L (ref 3.5–5.1)
Sodium: 134 mmol/L — ABNORMAL LOW (ref 135–145)

## 2018-06-06 LAB — CBC
HEMATOCRIT: 26.7 % — AB (ref 35.0–47.0)
HEMOGLOBIN: 8.8 g/dL — AB (ref 12.0–16.0)
MCH: 27.7 pg (ref 26.0–34.0)
MCHC: 33.1 g/dL (ref 32.0–36.0)
MCV: 83.5 fL (ref 80.0–100.0)
Platelets: 231 10*3/uL (ref 150–440)
RBC: 3.2 MIL/uL — ABNORMAL LOW (ref 3.80–5.20)
RDW: 18.8 % — ABNORMAL HIGH (ref 11.5–14.5)
WBC: 6.8 10*3/uL (ref 3.6–11.0)

## 2018-06-06 LAB — SURGICAL PATHOLOGY

## 2018-06-06 MED ORDER — ACETAMINOPHEN 325 MG PO TABS: 650.0000 mg | ORAL_TABLET | Freq: Four times a day (QID) | ORAL | Status: AC | PRN

## 2018-06-06 MED ORDER — ASPIRIN EC 325 MG PO TBEC
325.0000 mg | DELAYED_RELEASE_TABLET | Freq: Two times a day (BID) | ORAL | Status: DC
Start: 2018-06-07 — End: 2018-06-06

## 2018-06-06 MED ORDER — ASPIRIN 325 MG PO TBEC: 325.0000 mg | DELAYED_RELEASE_TABLET | Freq: Two times a day (BID) | ORAL | 0 refills | Status: AC

## 2018-06-06 MED ORDER — ASPIRIN 325 MG PO TABS: 325.0000 mg | ORAL_TABLET | Freq: Two times a day (BID) | ORAL | 0 refills | Status: DC

## 2018-06-06 MED ORDER — HYDROCODONE-ACETAMINOPHEN 5-325 MG PO TABS
1.0000 | ORAL_TABLET | Freq: Four times a day (QID) | ORAL | 0 refills | Status: AC | PRN
Start: 2018-06-06 — End: ?

## 2018-06-06 MED ORDER — LACTULOSE 10 GM/15ML PO SOLN: 30.0000 g | Freq: Three times a day (TID) | ORAL | Status: DC

## 2018-06-06 MED ORDER — TRAMADOL HCL 50 MG PO TABS: 50.0000 mg | ORAL_TABLET | Freq: Four times a day (QID) | ORAL | 0 refills | Status: AC | PRN

## 2018-06-06 NOTE — Progress Notes (Signed)
Medicare bundle case manager aware of D/C today.   [employer], LCSW [phone]

## 2018-06-06 NOTE — Progress Notes (Signed)
Pt is in no acute distress. Surgical site intact. VSS. RN called report to [employer]. EMS called to transport the pt.

## 2018-06-06 NOTE — Discharge Instructions (Signed)
Hip Fracture A hip fracture is a fracture of the upper part of your thigh bone (femur). What are the causes? A hip fracture is caused by a direct blow to the side of your hip. This is usually the result of a fall but can occur in other circumstances, such as an automobile accident. What increases the risk? There is an increased risk of hip fractures in people with:  An unsteady walking pattern (gait) and those with conditions that contribute to poor balance, such as Parkinson's disease or dementia.  Osteopenia and osteoporosis.  Cancer that spreads to the leg bones.  Certain metabolic diseases.  What are the signs or symptoms? Symptoms of hip fracture include:  Pain over the injured hip.  Inability to put weight on the leg in which the fracture occurred (although, some patients are able to walk after a hip fracture).  Toes and foot of the affected leg point outward when you lie down.  How is this diagnosed? A physical exam can determine if a hip fracture is likely to have occurred. X-ray exams are needed to confirm the fracture and to look for other injuries. The X-ray exam can help to determine the type of hip fracture. Rarely, the fracture is not visible on an X-ray image and a CT scan or MRI will have to be done. How is this treated? The treatment for a fracture is usually surgery. This means using a screw, nail, or rod to hold the bones in place. Follow these instructions at home: Take all medicines as directed by your health care provider. Contact a health care provider if: Pain continues, even after taking pain medicine. This information is not intended to replace advice given to you by your health care provider. Make sure you discuss any questions you have with your health care provider. Document Released: 10/17/2005 Document Revised: 03/24/2016 Document Reviewed: 05/29/2013 Elsevier Interactive Patient Education  2017 [employer]  

## 2018-06-06 NOTE — Clinical Social Work Note (Signed)
Patient to be d/c'ed today to [facility].  Patient and family agreeable to plans will transport via ems RN to call report [phone].  CSW left message on son [name]'s voice mail and spoke to patient's other son Catalina Antigua to inform them that patient is discharging today.   Evette Cristal, MSW, [place]

## 2018-06-06 NOTE — Care Management Important Message (Signed)
Important Message  Patient Details  Name: Denise Barry MRN: [id number] Date of Birth: Dec 03, 1933   Medicare Important Message Given:  Yes    [name] 06/06/2018, 10:00 AM

## 2018-06-06 NOTE — Progress Notes (Signed)
Physical Therapy Treatment Patient Details Name: Denise Barry MRN: [id number] DOB: October 04, 1934 Today's Date: 06/06/2018    History of Present Illness 84 y.o. female admitted post fall 06/03/18, s/p R hip hemiarthroplasty,  with a known history of diverticulitis, hypercholesterolemia, hypertension, osteoarthritis, varicose vein    PT Comments    Pt making excellent progress with therapy on this date. She improves with transfers and is able to increase her ambulation distance as well. Pt is able to complete all supine exercises as instructed. She is safe to discharge to SNF when medically appropriate. Pt will benefit from PT services to address deficits in strength, balance, and mobility in order to return to full function at home.     Follow Up Recommendations  SNF     Equipment Recommendations  None recommended by PT(Per patient states she has 4WW, RW, and bedside commode at home)    Recommendations for Other Services OT consult     Precautions / Restrictions Precautions Precautions: Fall Restrictions Weight Bearing Restrictions: Yes RLE Weight Bearing: Partial weight bearing    Mobility  Bed Mobility Overal bed mobility: Needs Assistance Bed Mobility: Sit to Supine     Supine to sit: Min assist     General bed mobility comments: MinAx1 for RLE management  Transfers Overall transfer level: Needs assistance Equipment used: Rolling walker (2 wheeled) Transfers: Sit to/from Stand Sit to Stand: Min assist         General transfer comment: Patient needed minAx1 to achieve full standing position. Cues for safe hand placement during transfers. Pt requires increased time and heavy reliance on UE. Decreased weight acceptance to RLE. Cues to avoid excessive hip flexion and not violate hip precautions.  Ambulation/Gait Ambulation/Gait assistance: Min assist Gait Distance (Feet): 15 Feet Assistive device: Rolling walker (2 wheeled)       General Gait Details: Pt is  able to ambulate partially to door and then back to recliner. Cues provided for sequencing with walker to maintain PWB status on RLE. Pt moves slowly and only requires infrequent minA+1 to manage her balance.    Stairs             Wheelchair Mobility    Modified Rankin (Stroke Patients Only)       Balance Overall balance assessment: Needs assistance Sitting-balance support: Feet supported Sitting balance-Leahy Scale: Fair       Standing balance-Leahy Scale: Poor Standing balance comment: Requires UE support to maintain balance                            Cognition Arousal/Alertness: Awake/alert Behavior During Therapy: WFL for tasks assessed/performed Overall Cognitive Status: Within Functional Limits for tasks assessed                                        Exercises General Exercises - Lower Extremity Ankle Circles/Pumps: AROM;Both;15 reps Quad Sets: AROM;15 reps;Both Gluteal Sets: AROM;Both;15 reps Short Arc Quad: AROM;Right;15 reps Long Arc Quad: AROM;Right Heel Slides: Right;15 reps;Supine Hip ABduction/ADduction: AROM;Right;15 reps Straight Leg Raises: AROM;Right;15 reps    General Comments        Pertinent Vitals/Pain Pain Assessment: No/denies pain    Home Living                      Prior Function  PT Goals (current goals can now be found in the care plan section) Acute Rehab PT Goals Patient Stated Goal: Patient wants to return home, but safely PT Goal Formulation: With patient Time For Goal Achievement: 06/19/18 Potential to Achieve Goals: Good Progress towards PT goals: Progressing toward goals    Frequency    BID      PT Plan Current plan remains appropriate    Co-evaluation              AM-PAC PT "6 Clicks" Daily Activity  Outcome Measure  Difficulty turning over in bed (including adjusting bedclothes, sheets and blankets)?: A Little Difficulty moving from lying on back  to sitting on the side of the bed? : Unable Difficulty sitting down on and standing up from a chair with arms (e.g., wheelchair, bedside commode, etc,.)?: Unable Help needed moving to and from a bed to chair (including a wheelchair)?: A Little Help needed walking in hospital room?: A Little Help needed climbing 3-5 steps with a railing? : Total 6 Click Score: 12    End of Session Equipment Utilized During Treatment: Gait belt Activity Tolerance: Patient limited by fatigue;Patient tolerated treatment well Patient left: Other (comment);with call bell/phone within reach;with SCD's reapplied;with bed alarm set;in bed(in traction)   PT Visit Diagnosis: Other abnormalities of gait and mobility (R26.89);History of falling (Z91.81);Muscle weakness (generalized) (M62.81);Unsteadiness on feet (R26.81)     Time: 6811-5726 PT Time Calculation (min) (ACUTE ONLY): 33 min  Charges:  $Gait Training: 8-22 mins $Therapeutic Exercise: 8-22 mins                     Tip Atienza D Tarron Krolak PT, DPT, GCS   Angeleigh Chiasson 06/06/2018, 1:25 PM

## 2018-06-06 NOTE — Discharge Summary (Signed)
[place] [facility] [place] NAME: Denise Barry    MR#:  [id number]  DATE OF BIRTH:  06-09-1934  DATE OF ADMISSION:  06/03/2018 ADMITTING PHYSICIAN: Vaughan Basta, MD  DATE OF DISCHARGE: 06/06/2018  PRIMARY CARE PHYSICIAN: Einar Pheasant, MD    ADMISSION DIAGNOSIS:  Closed fracture of right hip, initial encounter ([place]) [S72.001A]  DISCHARGE DIAGNOSIS:  Principal Problem:   Hip fracture, unspecified laterality, closed, initial encounter [facility]) Active Problems:   Hip fracture ([place])   SECONDARY DIAGNOSIS:   Past Medical History:  Diagnosis Date  . Allergy   . Diverticulitis    11/18  . Hypercholesterolemia   . Hypertension   . Osteoarthritis   . Urinary incontinence    pessary in place  . Varicose veins    H/O    HOSPITAL COURSE:   1.  Right hip fracture closed initial encounter requiring operative repair.  Patient status post right hemiarthroplasty by Dr. Sabra Heck on 06/04/2018.  Please see operative report.  The patient was partial weightbearing on the right leg with walker.  Dr. Sabra Heck recommends aspirin 325 twice daily for 6 weeks ending 07/17/2018.  Pain control with Norco for severe pain and tramadol for moderate pain.  Of note the patient does take tramadol 3 times a day at home for chronic pain.  Follow-up with Dr. Sabra Heck as outpatient.  Wound care as per Dr. Sabra Heck.  2.  Essential hypertension on Norvasc 3.  Hypokalemia.  Potassium supplemented. 4.  History of trigeminal neuralgia on chronic tramadol 5.  Iron deficiency anemia.  Continue iron supplementation.  Check hemoglobin in a couple weeks.   DISCHARGE CONDITIONS:   Satisfactory  CONSULTS OBTAINED:  Treatment Team:  Earnestine Leys, MD  DRUG ALLERGIES:   Allergies  Allergen Reactions  . Codeine Nausea Only    GI upset  . Cyclobenzaprine     Other reaction(s): Headache  . Methocarbamol     Other reaction(s): Other (See Comments)  . Sulfa Antibiotics Nausea  And Vomiting  . Carbamazepine Anxiety  . Gabapentin Anxiety    DISCHARGE MEDICATIONS:   Allergies as of 06/06/2018      Reactions   Codeine Nausea Only   GI upset   Cyclobenzaprine    Other reaction(s): Headache   Methocarbamol    Other reaction(s): Other (See Comments)   Sulfa Antibiotics Nausea And Vomiting   Carbamazepine Anxiety   Gabapentin Anxiety      Medication List    STOP taking these medications   aspirin 81 MG tablet Replaced by:  aspirin 325 MG EC tablet     TAKE these medications   acetaminophen 325 MG tablet Commonly known as:  TYLENOL Take 2 tablets (650 mg total) by mouth every 6 (six) hours as needed for mild pain or fever (PAIN). What changed:    medication strength  how much to take  when to take this  reasons to take this   amLODipine 10 MG tablet Commonly known as:  NORVASC Take 1 tablet (10 mg total) by mouth daily.   aspirin 325 MG EC tablet Take 1 tablet (325 mg total) by mouth 2 (two) times daily. Start taking on:  06/07/2018 Replaces:  aspirin 81 MG tablet   COLACE 100 MG capsule Generic drug:  docusate sodium Take 100 mg by mouth daily.   Eslicarbazepine Acetate 400 MG Tabs Take 1 tablet by mouth 3 (three) times daily.   ESTRACE VAGINAL 0.1 MG/GM vaginal cream Generic drug:  estradiol Apply  vaginally once monthly PRN   HYDROcodone-acetaminophen 5-325 MG tablet Commonly known as:  NORCO/VICODIN Take 1 tablet by mouth every 6 (six) hours as needed for moderate pain (pain score 4-6).   INTEGRA 62.5-62.5-40-3 MG Caps Take one tablet daily What changed:    how much to take  how to take this  when to take this  additional instructions   MULTI-VITAMINS Tabs Take 1 tablet by mouth.   traMADol 50 MG tablet Commonly known as:  ULTRAM Take 1 tablet (50 mg total) by mouth every 6 (six) hours as needed for moderate pain. What changed:  reasons to take this   triamcinolone cream 0.1 % Commonly known as:  KENALOG Apply 1  application topically 2 (two) times daily.        DISCHARGE INSTRUCTIONS:   Follow-up with Dr. at rehab 1 day Follow-up with Dr. Sabra Heck orthopedic surgery  If you experience worsening of your admission symptoms, develop shortness of breath, life threatening emergency, suicidal or homicidal thoughts you must seek medical attention immediately by calling 911 or calling your MD immediately  if symptoms less severe.  You Must read complete instructions/literature along with all the possible adverse reactions/side effects for all the Medicines you take and that have been prescribed to you. Take any new Medicines after you have completely understood and accept all the possible adverse reactions/side effects.   Please note  You were cared for by a hospitalist during your hospital stay. If you have any questions about your discharge medications or the care you received while you were in the hospital after you are discharged, you can call the unit and asked to speak with the hospitalist on call if the hospitalist that took care of you is not available. Once you are discharged, your primary care physician will handle any further medical issues. Please note that NO REFILLS for any discharge medications will be authorized once you are discharged, as it is imperative that you return to your primary care physician (or establish a relationship with a primary care physician if you do not have one) for your aftercare needs so that they can reassess your need for medications and monitor your lab values.    Today   CHIEF COMPLAINT:   Chief Complaint  Patient presents with  . Numbness    HISTORY OF PRESENT ILLNESS:  Denise Barry  is a 84 y.o. female presented after unable to bear weight on right leg.  Found to have a right hip fracture   VITAL SIGNS:  Blood pressure 125/63, pulse 92, temperature 97.8 F (36.6 C), temperature source Oral, resp. rate 16, height 5\' 6"  (1.676 m), weight 70.3 kg (155  lb), SpO2 93 %.   PHYSICAL EXAMINATION:  GENERAL:  84 y.o.-year-old patient lying in the bed with no acute distress.  EYES: Pupils equal, round, reactive to light and accommodation. No scleral icterus. Extraocular muscles intact.  HEENT: Head atraumatic, normocephalic. Oropharynx and nasopharynx clear.  NECK:  Supple, no jugular venous distention. No thyroid enlargement, no tenderness.  LUNGS: Normal breath sounds bilaterally, no wheezing, rales,rhonchi or crepitation. No use of accessory muscles of respiration.  CARDIOVASCULAR: S1, S2 normal. No murmurs, rubs, or gallops.  ABDOMEN: Soft, non-tender, non-distended. Bowel sounds present. No organomegaly or mass.  EXTREMITIES: No pedal edema, cyanosis, or clubbing.  NEUROLOGIC: Cranial nerves II through XII are intact. Muscle strength 5/5 in all extremities. Sensation intact. Gait not checked.  PSYCHIATRIC: The patient is alert and oriented x 3.  SKIN: No obvious  rash, lesion, or ulcer.   DATA REVIEW:   CBC Recent Labs  Lab 06/06/18 0549  WBC 6.8  HGB 8.8*  HCT 26.7*  PLT 231    Chemistries  Recent Labs  Lab 06/06/18 0549  NA 134*  K 5.0  CL 104  CO2 26  GLUCOSE 102*  BUN 19  CREATININE 0.78  CALCIUM 8.1*     Microbiology Results  Results for orders placed or performed during the hospital encounter of 06/03/18  Surgical PCR screen     Status: Abnormal   Collection Time: 06/03/18  9:14 PM  Result Value Ref Range Status   MRSA, PCR NEGATIVE NEGATIVE Final   Staphylococcus aureus POSITIVE (A) NEGATIVE Final    Comment: (NOTE) The Xpert SA Assay (FDA approved for NASAL specimens in patients 63 years of age and older), is one component of a comprehensive surveillance program. It is not intended to diagnose infection nor to guide or monitor treatment. Performed at [facility], [address] [place], [place] [address]     RADIOLOGY:  Dg Hip Port Unilat With Pelvis 1v Right  Result Date:  06/04/2018 CLINICAL DATA:  Post-op right hip hemiarthroplasty. EXAM: DG HIP (WITH OR WITHOUT PELVIS) 1V PORT RIGHT COMPARISON:  Plain film dated 06/03/2018. FINDINGS: New RIGHT hip arthroplasty hardware appears appropriately positioned. Expected postsurgical changes within the overlying soft tissues. LEFT hip arthroplasty hardware appears stable in position. IMPRESSION: Status post RIGHT hip arthroplasty. No evidence of surgical complicating feature. Electronically Signed   By: Franki Cabot M.D.   On: 06/04/2018 15:15    Management plans discussed with the patient,  and she is in agreement.  CODE STATUS:     Code Status Orders  (From admission, onward)        Start     Ordered   06/03/18 2032  Do not attempt resuscitation (DNR)  Continuous    Question Answer Comment  In the event of cardiac or respiratory ARREST Do not call a "code blue"   In the event of cardiac or respiratory ARREST Do not perform Intubation, CPR, defibrillation or ACLS   In the event of cardiac or respiratory ARREST Use medication by any route, position, wound care, and other measures to relive pain and suffering. May use oxygen, suction and manual treatment of airway obstruction as needed for comfort.      06/03/18 2031    Code Status History    Date Active Date Inactive Code Status Order ID Comments User Context   10/05/2017 2135 10/11/2017 1902 Full Code [id number]  Clayburn Pert, MD Inpatient    Advance Directive Documentation     Most Recent Value  Type of Advance Directive  Healthcare Power of Attorney  Pre-existing out of facility DNR order (yellow form or pink MOST form)  -  "MOST" Form in Place?  -      TOTAL TIME TAKING CARE OF THIS PATIENT: 35 minutes.    Loletha Grayer M.D on 06/06/2018 at 7:46 AM  Between 7am to 6pm - Pager - [phone]  After 6pm go to [email or web address] - password [email or web address]  [facility] Office  [phone]  CC: Primary care physician; Einar Pheasant, MD

## 2018-06-07 ENCOUNTER — Encounter: Payer: Self-pay | Admitting: Specialist

## 2018-06-08 ENCOUNTER — Telehealth: Payer: Self-pay | Admitting: Internal Medicine

## 2018-06-08 NOTE — Telephone Encounter (Signed)
Patient DC SNF to [facility] , closed fracture of hip.

## 2018-06-11 ENCOUNTER — Ambulatory Visit: Payer: Medicare Other | Admitting: Internal Medicine

## 2018-06-13 ENCOUNTER — Emergency Department: Payer: Medicare Other

## 2018-06-13 ENCOUNTER — Inpatient Hospital Stay
Admission: EM | Admit: 2018-06-13 | Discharge: 2018-07-01 | DRG: 871 | Disposition: E | Payer: Medicare Other | Source: Skilled Nursing Facility | Attending: Internal Medicine | Admitting: Internal Medicine

## 2018-06-13 ENCOUNTER — Other Ambulatory Visit: Payer: Self-pay

## 2018-06-13 DIAGNOSIS — G500 Trigeminal neuralgia: Secondary | ICD-10-CM | POA: Diagnosis present

## 2018-06-13 DIAGNOSIS — I10 Essential (primary) hypertension: Secondary | ICD-10-CM | POA: Diagnosis present

## 2018-06-13 DIAGNOSIS — D62 Acute posthemorrhagic anemia: Secondary | ICD-10-CM | POA: Diagnosis present

## 2018-06-13 DIAGNOSIS — N3000 Acute cystitis without hematuria: Secondary | ICD-10-CM | POA: Diagnosis present

## 2018-06-13 DIAGNOSIS — E872 Acidosis: Secondary | ICD-10-CM | POA: Diagnosis not present

## 2018-06-13 DIAGNOSIS — Z9851 Tubal ligation status: Secondary | ICD-10-CM | POA: Diagnosis not present

## 2018-06-13 DIAGNOSIS — E860 Dehydration: Secondary | ICD-10-CM | POA: Diagnosis present

## 2018-06-13 DIAGNOSIS — A0472 Enterocolitis due to Clostridium difficile, not specified as recurrent: Secondary | ICD-10-CM | POA: Diagnosis present

## 2018-06-13 DIAGNOSIS — Z882 Allergy status to sulfonamides status: Secondary | ICD-10-CM

## 2018-06-13 DIAGNOSIS — R918 Other nonspecific abnormal finding of lung field: Secondary | ICD-10-CM | POA: Diagnosis not present

## 2018-06-13 DIAGNOSIS — Z452 Encounter for adjustment and management of vascular access device: Secondary | ICD-10-CM | POA: Diagnosis not present

## 2018-06-13 DIAGNOSIS — E7800 Pure hypercholesterolemia, unspecified: Secondary | ICD-10-CM | POA: Diagnosis present

## 2018-06-13 DIAGNOSIS — G9341 Metabolic encephalopathy: Secondary | ICD-10-CM | POA: Diagnosis present

## 2018-06-13 DIAGNOSIS — Z8249 Family history of ischemic heart disease and other diseases of the circulatory system: Secondary | ICD-10-CM

## 2018-06-13 DIAGNOSIS — Z4682 Encounter for fitting and adjustment of non-vascular catheter: Secondary | ICD-10-CM | POA: Diagnosis not present

## 2018-06-13 DIAGNOSIS — I890 Lymphedema, not elsewhere classified: Secondary | ICD-10-CM | POA: Diagnosis not present

## 2018-06-13 DIAGNOSIS — E876 Hypokalemia: Secondary | ICD-10-CM | POA: Diagnosis present

## 2018-06-13 DIAGNOSIS — R197 Diarrhea, unspecified: Secondary | ICD-10-CM | POA: Diagnosis not present

## 2018-06-13 DIAGNOSIS — D72823 Leukemoid reaction: Secondary | ICD-10-CM | POA: Diagnosis present

## 2018-06-13 DIAGNOSIS — K567 Ileus, unspecified: Secondary | ICD-10-CM | POA: Diagnosis present

## 2018-06-13 DIAGNOSIS — Z87891 Personal history of nicotine dependence: Secondary | ICD-10-CM

## 2018-06-13 DIAGNOSIS — Z885 Allergy status to narcotic agent status: Secondary | ICD-10-CM

## 2018-06-13 DIAGNOSIS — Z7982 Long term (current) use of aspirin: Secondary | ICD-10-CM | POA: Diagnosis not present

## 2018-06-13 DIAGNOSIS — I872 Venous insufficiency (chronic) (peripheral): Secondary | ICD-10-CM | POA: Diagnosis not present

## 2018-06-13 DIAGNOSIS — J690 Pneumonitis due to inhalation of food and vomit: Secondary | ICD-10-CM | POA: Diagnosis present

## 2018-06-13 DIAGNOSIS — R4020 Unspecified coma: Secondary | ICD-10-CM | POA: Diagnosis not present

## 2018-06-13 DIAGNOSIS — R6521 Severe sepsis with septic shock: Secondary | ICD-10-CM | POA: Diagnosis present

## 2018-06-13 DIAGNOSIS — J181 Lobar pneumonia, unspecified organism: Secondary | ICD-10-CM

## 2018-06-13 DIAGNOSIS — N390 Urinary tract infection, site not specified: Secondary | ICD-10-CM | POA: Diagnosis not present

## 2018-06-13 DIAGNOSIS — M6281 Muscle weakness (generalized): Secondary | ICD-10-CM | POA: Diagnosis not present

## 2018-06-13 DIAGNOSIS — N3090 Cystitis, unspecified without hematuria: Secondary | ICD-10-CM

## 2018-06-13 DIAGNOSIS — Z9889 Other specified postprocedural states: Secondary | ICD-10-CM

## 2018-06-13 DIAGNOSIS — M1990 Unspecified osteoarthritis, unspecified site: Secondary | ICD-10-CM | POA: Diagnosis present

## 2018-06-13 DIAGNOSIS — J9690 Respiratory failure, unspecified, unspecified whether with hypoxia or hypercapnia: Secondary | ICD-10-CM

## 2018-06-13 DIAGNOSIS — Z66 Do not resuscitate: Secondary | ICD-10-CM | POA: Diagnosis present

## 2018-06-13 DIAGNOSIS — E871 Hypo-osmolality and hyponatremia: Secondary | ICD-10-CM | POA: Diagnosis present

## 2018-06-13 DIAGNOSIS — Z888 Allergy status to other drugs, medicaments and biological substances status: Secondary | ICD-10-CM

## 2018-06-13 DIAGNOSIS — A419 Sepsis, unspecified organism: Principal | ICD-10-CM

## 2018-06-13 DIAGNOSIS — R112 Nausea with vomiting, unspecified: Secondary | ICD-10-CM | POA: Diagnosis not present

## 2018-06-13 DIAGNOSIS — J9601 Acute respiratory failure with hypoxia: Secondary | ICD-10-CM | POA: Diagnosis not present

## 2018-06-13 DIAGNOSIS — R296 Repeated falls: Secondary | ICD-10-CM | POA: Diagnosis not present

## 2018-06-13 DIAGNOSIS — Z79899 Other long term (current) drug therapy: Secondary | ICD-10-CM

## 2018-06-13 DIAGNOSIS — S72001D Fracture of unspecified part of neck of right femur, subsequent encounter for closed fracture with routine healing: Secondary | ICD-10-CM | POA: Diagnosis not present

## 2018-06-13 DIAGNOSIS — R262 Difficulty in walking, not elsewhere classified: Secondary | ICD-10-CM | POA: Diagnosis not present

## 2018-06-13 DIAGNOSIS — N179 Acute kidney failure, unspecified: Secondary | ICD-10-CM | POA: Diagnosis present

## 2018-06-13 DIAGNOSIS — J189 Pneumonia, unspecified organism: Secondary | ICD-10-CM

## 2018-06-13 LAB — COMPREHENSIVE METABOLIC PANEL
ALBUMIN: 2.4 g/dL — AB (ref 3.5–5.0)
ALT: 26 U/L (ref 0–44)
ANION GAP: 8 (ref 5–15)
AST: 21 U/L (ref 15–41)
Alkaline Phosphatase: 113 U/L (ref 38–126)
BILIRUBIN TOTAL: 0.8 mg/dL (ref 0.3–1.2)
BUN: 30 mg/dL — ABNORMAL HIGH (ref 8–23)
CHLORIDE: 100 mmol/L (ref 98–111)
CO2: 23 mmol/L (ref 22–32)
Calcium: 7.1 mg/dL — ABNORMAL LOW (ref 8.9–10.3)
Creatinine, Ser: 1.34 mg/dL — ABNORMAL HIGH (ref 0.44–1.00)
GFR calc Af Amer: 42 mL/min — ABNORMAL LOW (ref >60)
GFR, EST NON AFRICAN AMERICAN: 36 mL/min — AB (ref >60)
GLUCOSE: 121 mg/dL — AB (ref 70–99)
Potassium: 3.4 mmol/L — ABNORMAL LOW (ref 3.5–5.1)
Sodium: 131 mmol/L — ABNORMAL LOW (ref 135–145)
TOTAL PROTEIN: 5.1 g/dL — AB (ref 6.5–8.1)

## 2018-06-13 LAB — CBC WITH DIFFERENTIAL/PLATELET
BASOS ABS: 0.1 10*3/uL (ref 0–0.1)
Basophils Absolute: 0.1 10*3/uL (ref 0–0.1)
Basophils Relative: 0 %
Basophils Relative: 0 %
Eosinophils Absolute: 0 10*3/uL (ref 0–0.7)
Eosinophils Absolute: 0 10*3/uL (ref 0–0.7)
Eosinophils Relative: 0 %
Eosinophils Relative: 0 %
HEMATOCRIT: 14.2 % — AB (ref 35.0–47.0)
HEMATOCRIT: 24.5 % — AB (ref 35.0–47.0)
HEMOGLOBIN: 4.7 g/dL — AB (ref 12.0–16.0)
Hemoglobin: 8.1 g/dL — ABNORMAL LOW (ref 12.0–16.0)
LYMPHS ABS: 0.2 10*3/uL — AB (ref 1.0–3.6)
LYMPHS PCT: 1 %
Lymphocytes Relative: 1 %
Lymphs Abs: 0.3 10*3/uL — ABNORMAL LOW (ref 1.0–3.6)
MCH: 28.7 pg (ref 26.0–34.0)
MCH: 27.9 pg (ref 26.0–34.0)
MCHC: 32.9 g/dL (ref 32.0–36.0)
MCHC: 33 g/dL (ref 32.0–36.0)
MCV: 87.1 fL (ref 80.0–100.0)
MCV: 84.5 fL (ref 80.0–100.0)
MONO ABS: 1.1 10*3/uL — AB (ref 0.2–0.9)
MONO ABS: 2 10*3/uL — AB (ref 0.2–0.9)
MONOS PCT: 6 %
MONOS PCT: 6 %
NEUTROS ABS: 17.8 10*3/uL — AB (ref 1.4–6.5)
NEUTROS ABS: 32.9 10*3/uL — AB (ref 1.4–6.5)
NEUTROS PCT: 93 %
Neutrophils Relative %: 93 %
Platelets: 240 10*3/uL (ref 150–440)
Platelets: 397 10*3/uL (ref 150–440)
RBC: 1.63 MIL/uL — ABNORMAL LOW (ref 3.80–5.20)
RBC: 2.91 MIL/uL — ABNORMAL LOW (ref 3.80–5.20)
RDW: 17.9 % — AB (ref 11.5–14.5)
RDW: 18 % — AB (ref 11.5–14.5)
WBC: 19.1 10*3/uL — ABNORMAL HIGH (ref 3.6–11.0)
WBC: 35.2 10*3/uL — ABNORMAL HIGH (ref 3.6–11.0)

## 2018-06-13 LAB — URINALYSIS, COMPLETE (UACMP) WITH MICROSCOPIC
BILIRUBIN URINE: NEGATIVE
Glucose, UA: NEGATIVE mg/dL
Hgb urine dipstick: NEGATIVE
Ketones, ur: NEGATIVE mg/dL
Leukocytes, UA: NEGATIVE
Nitrite: NEGATIVE
PH: 5 (ref 5.0–8.0)
PROTEIN: 30 mg/dL — AB
Specific Gravity, Urine: 1.02 (ref 1.005–1.030)

## 2018-06-13 LAB — LACTIC ACID, PLASMA
LACTIC ACID, VENOUS: 0.5 mmol/L (ref 0.5–1.9)
Lactic Acid, Venous: 1.2 mmol/L (ref 0.5–1.9)

## 2018-06-13 LAB — PROTIME-INR
INR: 1.4
Prothrombin Time: 17 seconds — ABNORMAL HIGH (ref 11.4–15.2)

## 2018-06-13 MED ORDER — DOCUSATE SODIUM 100 MG PO CAPS: 100.0000 mg | ORAL_CAPSULE | Freq: Two times a day (BID) | ORAL | Status: DC | PRN

## 2018-06-13 MED ORDER — HEPARIN SODIUM (PORCINE) 5000 UNIT/ML IJ SOLN
5000.0000 [IU] | Freq: Three times a day (TID) | INTRAMUSCULAR | Status: DC
  Administered 2018-06-13 – 2018-06-16 (×7): 5000 [IU] via SUBCUTANEOUS
  Filled 2018-06-13 (×8): qty 1

## 2018-06-13 MED ORDER — CEFTRIAXONE SODIUM 1 G IJ SOLR: 1.0000 g | INTRAMUSCULAR | Status: DC

## 2018-06-13 MED ORDER — SODIUM CHLORIDE 0.9 % IV BOLUS (SEPSIS)
1000.0000 mL | Freq: Once | INTRAVENOUS | Status: AC
  Administered 2018-06-13: 1000 mL via INTRAVENOUS

## 2018-06-13 MED ORDER — SODIUM CHLORIDE 0.9 % IV BOLUS (SEPSIS)
250.0000 mL | Freq: Once | INTRAVENOUS | Status: AC
  Administered 2018-06-13: 250 mL via INTRAVENOUS

## 2018-06-13 MED ORDER — SODIUM CHLORIDE 0.9 % IV SOLN
2.0000 g | Freq: Once | INTRAVENOUS | Status: AC
  Administered 2018-06-13: 2 g via INTRAVENOUS
  Filled 2018-06-13: qty 2

## 2018-06-13 MED ORDER — PROMETHAZINE HCL 25 MG/ML IJ SOLN
12.5000 mg | Freq: Once | INTRAMUSCULAR | Status: AC
  Administered 2018-06-15: 12.5 mg via INTRAMUSCULAR
  Filled 2018-06-13: qty 1

## 2018-06-13 MED ORDER — ADULT MULTIVITAMIN W/MINERALS CH
1.0000 | ORAL_TABLET | Freq: Every day | ORAL | Status: DC
  Administered 2018-06-14: 1 via ORAL
  Filled 2018-06-13: qty 1

## 2018-06-13 MED ORDER — FE FUMARATE-B12-VIT C-FA-IFC PO CAPS
1.0000 | ORAL_CAPSULE | Freq: Every day | ORAL | Status: DC
  Administered 2018-06-14: 1 via ORAL
  Filled 2018-06-13 (×2): qty 1

## 2018-06-13 MED ORDER — ESLICARBAZEPINE ACETATE 400 MG PO TABS
400.0000 mg | ORAL_TABLET | Freq: Three times a day (TID) | ORAL | Status: DC
  Administered 2018-06-14 – 2018-06-15 (×5): 400 mg via ORAL
  Filled 2018-06-13 (×5): qty 1

## 2018-06-13 MED ORDER — SODIUM CHLORIDE 0.9 % IV SOLN
500.0000 mg | Freq: Once | INTRAVENOUS | Status: AC
  Administered 2018-06-13: 500 mg via INTRAVENOUS
  Filled 2018-06-13: qty 500

## 2018-06-13 MED ORDER — POTASSIUM CHLORIDE CRYS ER 20 MEQ PO TBCR
20.0000 meq | EXTENDED_RELEASE_TABLET | Freq: Two times a day (BID) | ORAL | Status: DC
  Administered 2018-06-13 – 2018-06-14 (×3): 20 meq via ORAL
  Filled 2018-06-13 (×3): qty 1

## 2018-06-13 MED ORDER — ASPIRIN EC 325 MG PO TBEC
325.0000 mg | DELAYED_RELEASE_TABLET | Freq: Two times a day (BID) | ORAL | Status: DC
  Administered 2018-06-13 – 2018-06-14 (×3): 325 mg via ORAL
  Filled 2018-06-13 (×3): qty 1

## 2018-06-13 MED ORDER — ACETAMINOPHEN 325 MG PO TABS
650.0000 mg | ORAL_TABLET | Freq: Four times a day (QID) | ORAL | Status: DC | PRN
  Administered 2018-06-14: 650 mg via ORAL
  Filled 2018-06-13: qty 2

## 2018-06-13 MED ORDER — SODIUM CHLORIDE 0.9 % IV SOLN
2.0000 g | INTRAVENOUS | Status: DC
  Administered 2018-06-14: 2 g via INTRAVENOUS
  Filled 2018-06-13: qty 2

## 2018-06-13 MED ORDER — SODIUM CHLORIDE 0.9 % IV SOLN
INTRAVENOUS | Status: DC
  Administered 2018-06-13 – 2018-06-14 (×2): via INTRAVENOUS

## 2018-06-13 MED ORDER — DOCUSATE SODIUM 100 MG PO CAPS
100.0000 mg | ORAL_CAPSULE | Freq: Every day | ORAL | Status: DC
  Administered 2018-06-14: 100 mg via ORAL
  Filled 2018-06-13: qty 1

## 2018-06-13 MED ORDER — INTEGRA 62.5-62.5-40-3 MG PO CAPS: 1.0000 | ORAL_CAPSULE | Freq: Every day | ORAL | Status: DC

## 2018-06-13 MED ORDER — SODIUM CHLORIDE 0.9 % IV BOLUS
1000.0000 mL | Freq: Once | INTRAVENOUS | Status: AC
  Administered 2018-06-13: 1000 mL via INTRAVENOUS

## 2018-06-13 MED ORDER — HYDROCODONE-ACETAMINOPHEN 5-325 MG PO TABS: 1.0000 | ORAL_TABLET | Freq: Four times a day (QID) | ORAL | Status: DC | PRN

## 2018-06-13 NOTE — Progress Notes (Signed)
Pharmacy Antibiotic Note  Denise Barry is a 84 y.o. female admitted on 06/15/2018 with UTI/sepsis.  Pharmacy has been consulted for Cefepime dosing.  Patient from SNF  Plan: Cefepime 2 gram IV x1 in ER. Will continue with Cefepime 2 gram IV q24h for Crcl 30.8 ml/min.   Height: 5\' 6"  (167.6 cm) Weight: 155 lb (70.3 kg) IBW/kg (Calculated) : 59.3  Temp (24hrs), Avg:99 F (37.2 C), Min:98.1 F (36.7 C), Max:99.9 F (37.7 C)  Recent Labs  Lab 06/11/2018 1702 06/07/2018 1754 06/15/2018 1902  WBC 19.1* 35.2*  --   CREATININE  --  1.34*  --   LATICACIDVEN 0.5  --  1.2    Estimated Creatinine Clearance: 30.8 mL/min (A) (by C-G formula based on SCr of 1.34 mg/dL (H)).    Allergies  Allergen Reactions  . Codeine Nausea Only    GI upset  . Cyclobenzaprine     Other reaction(s): Headache  . Methocarbamol     Other reaction(s): Other (See Comments)  . Sulfa Antibiotics Nausea And Vomiting  . Carbamazepine Anxiety  . Gabapentin Anxiety    Antimicrobials this admission: Cefepime 8/14 >>       >>    Dose adjustments this admission:    Microbiology results: 8/14 BCx: P 8/14 UCx: P    Sputum:      MRSA PCR:    Thank you for allowing pharmacy to be a part of this patient's care.  Alaa Mullally A 06/09/2018 7:30 PM

## 2018-06-13 NOTE — H&P (Signed)
[place] at [place] NAME: Denise Barry    MR#:  [id number]  DATE OF BIRTH:  [birth year]  DATE OF ADMISSION:  06/17/2018  PRIMARY CARE PHYSICIAN: Einar Pheasant, MD   REQUESTING/REFERRING PHYSICIAN: Joni Fears   CHIEF COMPLAINT:   Chief Complaint  Patient presents with  . Code Sepsis    HISTORY OF PRESENT ILLNESS: Denise Barry  is a 84 y.o. female with a known history of allergy, diverticulitis, hypercholesterolemia, hypertension, varicose vein-had hip fracture and surgery was done last week.  She was sent to her office nursing home and was doing good for therapy initial few days. For last 2 days she appears more confused and had some chills, yesterday at the rehab place the physician ordered urinalysis and started on antibiotic first dose.  Urinalysis was positive and patient was looking worse today so family decided to bring to emergency room. She was noted to have elevated red blood cell count and tachycardia with borderline hypotension and drowsy.  UA was positive and chest x-ray was questionable for infiltrate so she was given to admit to hospitalist team.  PAST MEDICAL HISTORY:   Past Medical History:  Diagnosis Date  . Allergy   . Diverticulitis    11/18  . Hypercholesterolemia   . Hypertension   . Osteoarthritis   . Urinary incontinence    pessary in place  . Varicose veins    H/O    PAST SURGICAL HISTORY:  Past Surgical History:  Procedure Laterality Date  . BACK SURGERY  09/16/13  . DILATION AND CURETTAGE OF UTERUS  1971  . HIP ARTHROPLASTY Right 06/04/2018   Procedure: ARTHROPLASTY BIPOLAR HIP (HEMIARTHROPLASTY);  Surgeon: Earnestine Leys, MD;  Location: [facility] ORS;  Service: Orthopedics;  Laterality: Right;  . JOINT REPLACEMENT    . sclerosis  2000  . TUBAL LIGATION  1972    SOCIAL HISTORY:  Social History   Tobacco Use  . Smoking status: Former Research scientist (life sciences)  . Smokeless tobacco: Never Used  Substance Use Topics  .  Alcohol use: No    Alcohol/week: 0.0 standard drinks    FAMILY HISTORY:  Family History  Problem Relation Age of Onset  . Heart disease Mother   . Diabetes Mother   . Hypertension Mother   . Heart disease Father   . Stroke Father   . Cancer Son        bladder  . COPD Son   . Breast cancer Neg Hx     DRUG ALLERGIES:  Allergies  Allergen Reactions  . Codeine Nausea Only    GI upset  . Cyclobenzaprine     Other reaction(s): Headache  . Methocarbamol     Other reaction(s): Other (See Comments)  . Sulfa Antibiotics Nausea And Vomiting  . Carbamazepine Anxiety  . Gabapentin Anxiety    REVIEW OF SYSTEMS:   Patient is drowsy and cannot give review of system and her son is in the room to give details.  MEDICATIONS AT HOME:  Prior to Admission medications   Medication Sig Start Date End Date Taking? Authorizing Provider  acetaminophen (TYLENOL) 325 MG tablet Take 2 tablets (650 mg total) by mouth every 6 (six) hours as needed for mild pain or fever (PAIN). 06/06/18  Yes Wieting, Richard, MD  amLODipine (NORVASC) 10 MG tablet Take 1 tablet (10 mg total) by mouth daily. 11/24/17  Yes Einar Pheasant, MD  aspirin EC 325 MG EC tablet Take 1 tablet (325 mg total) by mouth 2 (  two) times daily. 06/07/18 07/17/18 Yes Wieting, Richard, MD  cefTRIAXone 1 g in sodium chloride 0.9 % 100 mL Inject 1 g into the vein daily. Mixed with lidocaine 06/29/2018 06/20/18 Yes [provider]  docusate sodium (COLACE) 100 MG capsule Take 100 mg by mouth daily.   Yes [provider]  Eslicarbazepine Acetate 400 MG TABS Take 400 mg by mouth 3 (three) times daily.    Yes [provider]  Fe Fum-FePoly-Vit C-Vit B3 (INTEGRA) 62.5-62.5-40-3 MG CAPS Take one tablet daily Patient taking differently: Take 1 capsule by mouth daily.  02/26/18  Yes Einar Pheasant, MD  HYDROcodone-acetaminophen (NORCO/VICODIN) 5-325 MG tablet Take 1 tablet by mouth every 6 (six) hours as needed for moderate pain  (pain score 4-6). 06/06/18  Yes Wieting, Richard, MD  Multiple Vitamin (MULTI-VITAMINS) TABS Take 1 tablet by mouth daily.    Yes [provider]  traMADol (ULTRAM) 50 MG tablet Take 1 tablet (50 mg total) by mouth every 6 (six) hours as needed for moderate pain. 06/06/18  Yes Wieting, Richard, MD  promethazine (PHENERGAN) 25 MG/ML injection Inject 12.5 mg into the muscle once.    [provider]  triamcinolone cream (KENALOG) 0.1 % Apply 1 application topically 2 (two) times daily. Patient not taking: Reported on 06/29/2018 04/24/18   Einar Pheasant, MD      PHYSICAL EXAMINATION:   VITAL SIGNS: Blood pressure (!) 117/58, pulse (!) 110, temperature 98.1 F (36.7 C), temperature source Oral, resp. rate (!) 23, height 5\' 6"  (1.676 m), weight 70.3 kg, SpO2 100 %.  GENERAL:  85 y.o.-year-old patient lying in the bed with no acute distress.  EYES: Pupils equal, round, reactive to light and accommodation. No scleral icterus. Extraocular muscles intact.  HEENT: Head atraumatic, normocephalic. Oropharynx and nasopharynx clear.  NECK:  Supple, no jugular venous distention. No thyroid enlargement, no tenderness.  LUNGS: Normal breath sounds bilaterally, no wheezing, some crepitation. No use of accessory muscles of respiration.  CARDIOVASCULAR: S1, S2 normal. No murmurs, rubs, or gallops.  ABDOMEN: Soft, nontender, nondistended. Bowel sounds present. No organomegaly or mass.  EXTREMITIES: No pedal edema, cyanosis, or clubbing. Right hip- post surgical dressing present. NEUROLOGIC: Cranial nerves II through XII are intact. Muscle strength 3-4/5 in all extremities. Sensation intact. Gait not checked.  PSYCHIATRIC: The patient is drowsy.  SKIN: No obvious rash, lesion, or ulcer.   LABORATORY PANEL:   CBC Recent Labs  Lab 06/17/2018 1702 06/06/2018 1754  WBC 19.1* 35.2*  HGB 4.7* 8.1*  HCT 14.2* 24.5*  PLT 240 397  MCV 87.1 84.5  MCH 28.7 27.9  MCHC 32.9 33.0  RDW 17.9* 18.0*   LYMPHSABS 0.2* 0.3*  MONOABS 1.1* 2.0*  EOSABS 0.0 0.0  BASOSABS 0.1 0.1   ------------------------------------------------------------------------------------------------------------------  Chemistries  Recent Labs  Lab 06/01/2018 1754  NA 131*  K 3.4*  CL 100  CO2 23  GLUCOSE 121*  BUN 30*  CREATININE 1.34*  CALCIUM 7.1*  AST 21  ALT 26  ALKPHOS 113  BILITOT 0.8   ------------------------------------------------------------------------------------------------------------------ estimated creatinine clearance is 30.8 mL/min (A) (by C-G formula based on SCr of 1.34 mg/dL (H)). ------------------------------------------------------------------------------------------------------------------ No results for input(s): TSH, T4TOTAL, T3FREE, THYROIDAB in the last 72 hours.  Invalid input(s): FREET3   Coagulation profile Recent Labs  Lab 06/11/2018 1702  INR 1.40   ------------------------------------------------------------------------------------------------------------------- No results for input(s): DDIMER in the last 72 hours. -------------------------------------------------------------------------------------------------------------------  Cardiac Enzymes No results for input(s): CKMB, TROPONINI, MYOGLOBIN in the last 168 hours.  Invalid input(s): CK ------------------------------------------------------------------------------------------------------------------ Invalid input(s): POCBNP  ---------------------------------------------------------------------------------------------------------------  Urinalysis    Component Value Date/Time   COLORURINE AMBER (A) 06/19/2018 1754   APPEARANCEUR CLOUDY (A) 06/27/2018 1754   APPEARANCEUR Clear 11/07/2017 1449   LABSPEC 1.020 06/04/2018 1754   LABSPEC 1.020 02/05/2015 0830   PHURINE 5.0 06/23/2018 1754   GLUCOSEU NEGATIVE 06/22/2018 1754   GLUCOSEU NEGATIVE 11/16/2017 1156   HGBUR NEGATIVE 06/03/2018 1754    BILIRUBINUR NEGATIVE 06/01/2018 1754   BILIRUBINUR Negative 11/07/2017 1449   BILIRUBINUR Negative 02/05/2015 0830   KETONESUR NEGATIVE 06/26/2018 1754   PROTEINUR 30 (A) 05/31/2018 1754   UROBILINOGEN 0.2 11/16/2017 1156   NITRITE NEGATIVE 06/06/2018 1754   LEUKOCYTESUR NEGATIVE 06/28/2018 1754   LEUKOCYTESUR 1+ (A) 11/07/2017 1449   LEUKOCYTESUR 2+ 02/05/2015 0830     RADIOLOGY: Dg Chest 2 View  Result Date: 06/10/2018 CLINICAL DATA:  Possible sepsis EXAM: CHEST - 2 VIEW COMPARISON:  06/03/2018 FINDINGS: Surgical plate and screw fixation of proximal left humerus. Patchy left greater than right bibasilar infiltrates. No significant effusion. Stable cardiomediastinal silhouette with aortic atherosclerosis. No pneumothorax. IMPRESSION: Patchy left greater than right basilar infiltrates. Electronically Signed   By: Donavan Foil M.D.   On: 06/30/2018 18:03   Ct Head Wo Contrast  Result Date: 06/11/2018 CLINICAL DATA:  Altered LOC EXAM: CT HEAD WITHOUT CONTRAST TECHNIQUE: Contiguous axial images were obtained from the base of the skull through the vertex without intravenous contrast. COMPARISON:  MRI 08/06/2012 FINDINGS: Brain: No acute territorial infarction, hemorrhage, or intracranial mass. Atrophy with small vessel ischemic changes of the white matter. Prominent ventricles felt secondary to atrophy Vascular: No hyperdense vessels.  Carotid vascular calcification Skull: Normal. Negative for fracture or focal lesion. Sinuses/Orbits: No acute finding. Other: None IMPRESSION: 1. No CT evidence for acute intracranial abnormality. 2. Atrophy with small vessel ischemic changes of the white matter Electronically Signed   By: Donavan Foil M.D.   On: 06/30/2018 17:53    EKG: Orders placed or performed during the hospital encounter of 06/03/18  . ED EKG  . ED EKG    IMPRESSION AND PLAN:  * Sepsis   UTI, Possible Pneumonia   IV cefepime and azithromycine one dose given   Blood cx and Ur cx  sent.  * ac renal failure   IV fluids, monitor.  * Hyponatremia   IV NS  * Anemia due to blood loss   Recent Hip surgery   Monitor  * Hypokalemia   Replace oral  * Hypertension Hold amlodipine due to sepsis and hypotension.  *Trigeminal neuralgia We will continue her home medication.  All the records are reviewed and case discussed with ED provider. Management plans discussed with the patient, family and they are in agreement.  CODE STATUS: Full code. Code Status History    Date Active Date Inactive Code Status Order ID Comments User Context   06/03/2018 2031 06/06/2018 1822 DNR [id number]  Vaughan Basta, MD Inpatient   10/05/2017 2135 10/11/2017 1902 Full Code [id number]  Clayburn Pert, MD Inpatient    Questions for Most Recent Historical Code Status (Order [id number])    Question Answer Comment   In the event of cardiac or respiratory ARREST Do not call a "code blue"    In the event of cardiac or respiratory ARREST Do not perform Intubation, CPR, defibrillation or ACLS    In the event of cardiac or respiratory ARREST Use medication by any route, position, wound care, and other measures to relive pain and  suffering. May use oxygen, suction and manual treatment of airway obstruction as needed for comfort.      Son and son-in law present in room.  TOTAL TIME TAKING CARE OF THIS PATIENT: 45 minutes.    Vaughan Basta M.D on 06/19/2018   Between 7am to 6pm - Pager - [phone]  After 6pm go to [email or web address] - password [email or web address] [facility]  Office  [phone]  CC: Primary care physician; Einar Pheasant, MD   Note: This dictation was prepared with Dragon dictation along with smaller phrase technology. Any transcriptional errors that result from this process are unintentional.

## 2018-06-13 NOTE — ED Notes (Signed)
Awaiting antibiotic from pharmacy.

## 2018-06-13 NOTE — Progress Notes (Signed)
Family Meeting Note  Advance Directive:yes  Today a meeting took place with the son and son in law.  Patient is unable to participate due ER:DEYCXK capacity confusion   The following clinical team members were present during this meeting:MD  The following were discussed:Patient's diagnosis: Hypertension, sepsis, UTI, anemia , Patient's progosis: Unable to determine and Goals for treatment: Full Code  Additional follow-up to be provided: PMD, Ortho surgery  Time spent during discussion:20 minutes  Vaughan Basta, MD

## 2018-06-13 NOTE — ED Provider Notes (Signed)
[facility] Emergency Department Provider Note  ____________________________________________  Time seen: Approximately 7:34 PM  I have reviewed the triage vital signs and the nursing notes.   HISTORY  Chief Complaint Code Sepsis  Level 5 Caveat: Portions of the History and Physical including HPI and review of systems are unable to be completely obtained due to patient being a poor historian due to altered mental status   HPI Denise Barry is a 84 y.o. female with a history of hypertension diverticulitis and recent hip fracture 1 week ago who is brought to the ED today due to concern for sepsis and urinary tract infection.  Nursing home noted fever.  They checked labs and found a white blood cell count of 35,000 and a clear urinary tract infection on urinalysis.  Patient denies any pain at this time.  Does not relate any other complaints.    Initial room air oxygen saturation 89%.  Past Medical History:  Diagnosis Date  . Allergy   . Diverticulitis    11/18  . Hypercholesterolemia   . Hypertension   . Osteoarthritis   . Urinary incontinence    pessary in place  . Varicose veins    H/O     Patient Active Problem List   Diagnosis Date Noted  . Hip fracture, unspecified laterality, closed, initial encounter ([place]) 06/03/2018  . Hip fracture ([place]) 06/03/2018  . Chronic venous insufficiency 05/20/2018  . Lymphedema 04/24/2018  . Spondylosis without myelopathy or radiculopathy, lumbosacral region 12/21/2017  . Other specified dorsopathies, sacral and sacrococcygeal region 12/21/2017  . Difficulty sleeping 10/22/2017  . Diverticulitis   . Abdominal pain 10/05/2017  . Elevated WBC count 10/05/2017  . Granulocytosis 10/05/2017  . Diverticulitis of large intestine with perforation 10/05/2017  . Chronic musculoskeletal pain 09/13/2017  . Lumbar Spondylosis 09/13/2017  . Lumbar facet syndrome (Bilateral) (L>R) 09/13/2017  . Chronic sacroiliac  joint pain (Bilateral) (L>R) 09/13/2017  . Old Compression fracture of L1 and L2 09/12/2017  . DDD (degenerative disc disease), thoracic 09/12/2017  . Scoliosis 09/12/2017  . Abnormal MRI, lumbar spine (07/25/2017) 09/12/2017  . Chronic low back pain (Primary Area of Pain)  (Bilateral) (L>R) 08/28/2017  . Chronic pain syndrome 08/28/2017  . Opiate use 08/28/2017  . Other long term (current) drug therapy 08/28/2017  . Other specified health status 08/28/2017  . Other reduced mobility 08/28/2017  . Disorder of bone, unspecified 08/28/2017  . Syncope 07/08/2017  . Anemia 06/10/2017  . SUNCT (short unilateral neuralgiform headache, conjunctival inj/tear) 07/09/2015  . History of shoulder fracture (Left) 06/03/2015  . Hyponatremia 05/07/2015  . Closed 2-part displaced fracture of surgical neck of left humerus 02/09/2015  . Health care maintenance 01/18/2015  . History of hip fracture (Left) 07/13/2014  . Chronic Trigeminal neuralgia (Right) 01/07/2014  . Essential hypertension, benign 01/07/2014  . Hypercholesterolemia 01/07/2014  . Failed back surgical syndrome (L4-5 fusion) 01/07/2014  . Urinary incontinence 01/07/2014  . Acquired spondylolisthesis 09/13/2013  . Spinal stenosis, unspecified region other than cervical 09/13/2013  . DDD (degenerative disc disease), lumbar 06/05/2013     Past Surgical History:  Procedure Laterality Date  . BACK SURGERY  09/16/13  . DILATION AND CURETTAGE OF UTERUS  1971  . HIP ARTHROPLASTY Right 06/04/2018   Procedure: ARTHROPLASTY BIPOLAR HIP (HEMIARTHROPLASTY);  Surgeon: Earnestine Leys, MD;  Location: [facility] ORS;  Service: Orthopedics;  Laterality: Right;  . JOINT REPLACEMENT    . sclerosis  2000  . [place]  Prior to Admission medications   Medication Sig Start Date End Date Taking? Authorizing Provider  acetaminophen (TYLENOL) 325 MG tablet Take 2 tablets (650 mg total) by mouth every 6 (six) hours as needed for mild pain or  fever (PAIN). 06/06/18  Yes Wieting, Richard, MD  amLODipine (NORVASC) 10 MG tablet Take 1 tablet (10 mg total) by mouth daily. 11/24/17  Yes Einar Pheasant, MD  aspirin EC 325 MG EC tablet Take 1 tablet (325 mg total) by mouth 2 (two) times daily. 06/07/18 07/17/18 Yes Wieting, Richard, MD  cefTRIAXone 1 g in sodium chloride 0.9 % 100 mL Inject 1 g into the vein daily. Mixed with lidocaine 06/06/2018 06/20/18 Yes [provider]  docusate sodium (COLACE) 100 MG capsule Take 100 mg by mouth daily.   Yes [provider]  Eslicarbazepine Acetate 400 MG TABS Take 400 mg by mouth 3 (three) times daily.    Yes [provider]  Fe Fum-FePoly-Vit C-Vit B3 (INTEGRA) 62.5-62.5-40-3 MG CAPS Take one tablet daily Patient taking differently: Take 1 capsule by mouth daily.  02/26/18  Yes Einar Pheasant, MD  HYDROcodone-acetaminophen (NORCO/VICODIN) 5-325 MG tablet Take 1 tablet by mouth every 6 (six) hours as needed for moderate pain (pain score 4-6). 06/06/18  Yes Wieting, Richard, MD  Multiple Vitamin (MULTI-VITAMINS) TABS Take 1 tablet by mouth daily.    Yes [provider]  traMADol (ULTRAM) 50 MG tablet Take 1 tablet (50 mg total) by mouth every 6 (six) hours as needed for moderate pain. 06/06/18  Yes Wieting, Richard, MD  promethazine (PHENERGAN) 25 MG/ML injection Inject 12.5 mg into the muscle once.    [provider]  triamcinolone cream (KENALOG) 0.1 % Apply 1 application topically 2 (two) times daily. Patient not taking: Reported on 06/07/2018 04/24/18   Einar Pheasant, MD     Allergies Codeine; Cyclobenzaprine; Methocarbamol; Sulfa antibiotics; Carbamazepine; and Gabapentin   Family History  Problem Relation Age of Onset  . Heart disease Mother   . Diabetes Mother   . Hypertension Mother   . Heart disease Father   . Stroke Father   . Cancer Son        bladder  . COPD Son   . Breast cancer Neg Hx     Social History Social History   Tobacco Use  .  Smoking status: Former Research scientist (life sciences)  . Smokeless tobacco: Never Used  Substance Use Topics  . Alcohol use: No    Alcohol/week: 0.0 standard drinks  . Drug use: No    Review of Systems  Constitutional:   Positive fever ENT:   No sore throat. No rhinorrhea. Cardiovascular:   No chest pain or syncope. Respiratory:   No dyspnea positive cough Gastrointestinal:   Negative for abdominal pain, vomiting and diarrhea.  All other systems reviewed and are negative except as documented above in ROS and HPI.  ____________________________________________   PHYSICAL EXAM:  VITAL SIGNS: ED Triage Vitals  Enc Vitals Group     BP 06/26/2018 1730 (!) 99/53     Pulse Rate 06/12/2018 1730 (!) 106     Resp 06/27/2018 1730 (!) 21     Temp 06/15/2018 1700 99.9 F (37.7 C)     Temp Source 06/25/2018 1700 Axillary     SpO2 06/08/2018 1657 91 %     Weight 06/24/2018 1701 155 lb (70.3 kg)     Height 06/07/2018 1701 5\' 6"  (1.676 m)     Head Circumference --      Peak  Flow --      Pain Score 06/23/2018 1701 0     Pain Loc --      Pain Edu? --      Excl. in [place]? --     Vital signs reviewed, nursing assessments reviewed.   Constitutional:   Alert and oriented to person and place.  Ill-appearing Eyes:   Conjunctivae are normal. EOMI. PERRL. ENT      Head:   Normocephalic and atraumatic.      Nose:   No congestion/rhinnorhea.       Mouth/Throat:   Dry mucous membranes, no pharyngeal erythema. No peritonsillar mass.       Neck:   No meningismus. Full ROM. Hematological/Lymphatic/Immunilogical:   No cervical lymphadenopathy. Cardiovascular:   Tachycardia cardia heart rate 110. Symmetric bilateral radial and DP pulses.  No murmurs. Cap refill less than 2 seconds. Respiratory:   Tachypnea, normal work of breathing, good air entry in all lung fields with bibasilar crackles. Gastrointestinal:   Soft and nontender. Non distended. There is no CVA tenderness.  No rebound, rigidity, or guarding.  Rectal exam shows liquid brown  stool, Hemoccult negative, controls okay Musculoskeletal:   Normal range of motion in all extremities. No joint effusions.  No lower extremity tenderness.  No edema. Neurologic:   Normal speech but very limited language, unable to answer complex questions. Motor grossly intact. No acute focal neurologic deficits are appreciated.  Skin:    Skin is warm, dry and intact. No rash noted.  No petechiae, purpura, or bullae.  ____________________________________________    LABS (pertinent positives/negatives) (all labs ordered are listed, but only abnormal results are displayed) Labs Reviewed  CBC WITH DIFFERENTIAL/PLATELET - Abnormal; Notable for the following components:      Result Value   WBC 19.1 (*)    RBC 1.63 (*)    Hemoglobin 4.7 (*)    HCT 14.2 (*)    RDW 17.9 (*)    Neutro Abs 17.8 (*)    Lymphs Abs 0.2 (*)    Monocytes Absolute 1.1 (*)    All other components within normal limits  PROTIME-INR - Abnormal; Notable for the following components:   Prothrombin Time 17.0 (*)    All other components within normal limits  URINALYSIS, COMPLETE (UACMP) WITH MICROSCOPIC - Abnormal; Notable for the following components:   Color, Urine AMBER (*)    APPearance CLOUDY (*)    Protein, ur 30 (*)    Bacteria, UA FEW (*)    All other components within normal limits  COMPREHENSIVE METABOLIC PANEL - Abnormal; Notable for the following components:   Sodium 131 (*)    Potassium 3.4 (*)    Glucose, Bld 121 (*)    BUN 30 (*)    Creatinine, Ser 1.34 (*)    Calcium 7.1 (*)    Total Protein 5.1 (*)    Albumin 2.4 (*)    GFR calc non Af Amer 36 (*)    GFR calc Af Amer 42 (*)    All other components within normal limits  CBC WITH DIFFERENTIAL/PLATELET - Abnormal; Notable for the following components:   WBC 35.2 (*)    RBC 2.91 (*)    Hemoglobin 8.1 (*)    HCT 24.5 (*)    RDW 18.0 (*)    Neutro Abs 32.9 (*)    Lymphs Abs 0.3 (*)    Monocytes Absolute 2.0 (*)    All other components within  normal limits  CULTURE, BLOOD (ROUTINE X 2)  CULTURE, BLOOD (ROUTINE X 2)  URINE CULTURE  LACTIC ACID, PLASMA  LACTIC ACID, PLASMA  TYPE AND SCREEN  TYPE AND SCREEN   ____________________________________________   EKG    ____________________________________________    RADIOLOGY  Dg Chest 2 View  Result Date: 06/14/2018 CLINICAL DATA:  Possible sepsis EXAM: CHEST - 2 VIEW COMPARISON:  06/03/2018 FINDINGS: Surgical plate and screw fixation of proximal left humerus. Patchy left greater than right bibasilar infiltrates. No significant effusion. Stable cardiomediastinal silhouette with aortic atherosclerosis. No pneumothorax. IMPRESSION: Patchy left greater than right basilar infiltrates. Electronically Signed   By: Donavan Foil M.D.   On: 06/08/2018 18:03   Ct Head Wo Contrast  Result Date: 06/11/2018 CLINICAL DATA:  Altered LOC EXAM: CT HEAD WITHOUT CONTRAST TECHNIQUE: Contiguous axial images were obtained from the base of the skull through the vertex without intravenous contrast. COMPARISON:  MRI 08/06/2012 FINDINGS: Brain: No acute territorial infarction, hemorrhage, or intracranial mass. Atrophy with small vessel ischemic changes of the white matter. Prominent ventricles felt secondary to atrophy Vascular: No hyperdense vessels.  Carotid vascular calcification Skull: Normal. Negative for fracture or focal lesion. Sinuses/Orbits: No acute finding. Other: None IMPRESSION: 1. No CT evidence for acute intracranial abnormality. 2. Atrophy with small vessel ischemic changes of the white matter Electronically Signed   By: Donavan Foil M.D.   On: 06/07/2018 17:53    ____________________________________________   PROCEDURES .Critical Care Performed by: Carrie Mew, MD Authorized by: Carrie Mew, MD   Critical care provider statement:    Critical care time (minutes):  35   Critical care time was exclusive of:  Separately billable procedures and treating other patients    Critical care was necessary to treat or prevent imminent or life-threatening deterioration of the following conditions:  Sepsis   Critical care was time spent personally by me on the following activities:  Development of treatment plan with patient or surrogate, discussions with consultants, evaluation of patient's response to treatment, examination of patient, obtaining history from patient or surrogate, ordering and performing treatments and interventions, ordering and review of laboratory studies, ordering and review of radiographic studies, pulse oximetry, re-evaluation of patient's condition and review of old charts    ____________________________________________  DIFFERENTIAL DIAGNOSIS   Urinary tract infection, pneumonia, sepsis  CLINICAL IMPRESSION / ASSESSMENT AND PLAN / ED COURSE  Pertinent labs & imaging results that were available during my care of the patient were reviewed by me and considered in my medical decision making (see chart for details).      Clinical Course as of Jun 13 1933  Wed Jun 13, 2018  1709 Patient presents with fever and tachycardia.  Labs obtained by her nursing home today show a white blood cell count of 34,000, hemoglobin of 7.7, there UTI on urinalysis with high white blood cells and high bacteria.  Initiate code sepsis.  Lactate and blood cultures.  IV cefepime 2 g for healthcare associated urinary tract infection.  Patient will need to be admitted for further sepsis management.  Start 2 L normal saline bolus.   [PS]  1710 Due to altered mental status, will obtain CT scan of the head.  Likely due to fatigue and delirium in the setting of chronic dementia, but will need to evaluate for possible  intracranial hemorrhage or stroke.   [PS]  1810 Likely erroneous. Hb was 7.7 yesterday, 9 a week ago. No evidence of blood loss. DRE hemoccult negative. Right hip surg. Site without sub Q hematoma. Will resend CBC  Hemoglobin(!!): 4.7 [PS]  1810 Lab reports  lactate and chemistry "look funny" and want a new sample. Will continue to wait for lab data.    [PS]  8343 Presentation and care d/w sons at bedside who agree with current sepsis treatment plan and admission.   [PS]  1845 No acute anemia. C/w outpatient labs.  Cxr shows bilateral patchy infiltrates. Will add azithromycin for possible CAP.   Hemoglobin(!): 8.1 [PS]    Clinical Course User Index [PS] Carrie Mew, MD     ----------------------------------------- 7:58 PM on 06/09/2018 -----------------------------------------  CT head negative for acute intracranial pathology.  Case discussed with the hospitalist for further management.  ____________________________________________   FINAL CLINICAL IMPRESSION(S) / ED DIAGNOSES    Final diagnoses:  Cystitis  Pneumonia of both lower lobes due to infectious organism ([place])  Sepsis, due to unspecified organism [facility])     ED Discharge Orders    None      Portions of this note were generated with dragon dictation software. Dictation errors may occur despite best attempts at proofreading.    Carrie Mew, MD 06/12/2018 1958

## 2018-06-13 NOTE — Progress Notes (Signed)
CODE SEPSIS - PHARMACY COMMUNICATION  **Broad Spectrum Antibiotics should be administered within 1 hour of Sepsis diagnosis**  Time Code Sepsis Called/Page Received: 1710  Antibiotics Ordered: Cefepime  Time of 1st antibiotic administration:    Additional action taken by pharmacy: Called RN to ask about abx. RN states they are working on it, pt is having "issues"  If necessary, Name of Provider/Nurse Contacted:      Noralee Space ,PharmD Clinical Pharmacist  06/06/2018  6:19 PM

## 2018-06-13 NOTE — Progress Notes (Addendum)
CODE SEPSIS - PHARMACY COMMUNICATION  **Broad Spectrum Antibiotics should be administered within 1 hour of Sepsis diagnosis**  Time Code Sepsis Called/Page Received: 1710  Antibiotics Ordered: Cefepime  Time of 1st antibiotic administration:  1853  Additional action taken by pharmacy: Called RN to ask about abx. RN states they are working on it, pt is having "issues"  1824: RN called back stating ED pyxis was stocked out of medication. Will send from [place]  If necessary, Name of Provider/Nurse Contacted:      Noralee Space ,PharmD Clinical Pharmacist  06/08/2018  6:24 PM

## 2018-06-13 NOTE — ED Triage Notes (Signed)
Pt arrived via ems from [place] with N/V/weakness. Pt had a temp at facility and a temp of 102.4 with ems. Pt has hx of being altered, vitals= 101/44, 114, 89% on RA, and 94% on 4L. Pt lethargic while lying in bed, slowly opening eyes.

## 2018-06-14 LAB — BASIC METABOLIC PANEL
ANION GAP: 8 (ref 5–15)
BUN: 30 mg/dL — ABNORMAL HIGH (ref 8–23)
CHLORIDE: 105 mmol/L (ref 98–111)
CO2: 21 mmol/L — ABNORMAL LOW (ref 22–32)
Calcium: 7.3 mg/dL — ABNORMAL LOW (ref 8.9–10.3)
Creatinine, Ser: 1.02 mg/dL — ABNORMAL HIGH (ref 0.44–1.00)
GFR calc non Af Amer: 50 mL/min — ABNORMAL LOW (ref >60)
GFR, EST AFRICAN AMERICAN: 58 mL/min — AB (ref >60)
GLUCOSE: 122 mg/dL — AB (ref 70–99)
POTASSIUM: 4 mmol/L (ref 3.5–5.1)
Sodium: 134 mmol/L — ABNORMAL LOW (ref 135–145)

## 2018-06-14 LAB — CBC
HEMATOCRIT: 26.6 % — AB (ref 35.0–47.0)
HEMOGLOBIN: 8.8 g/dL — AB (ref 12.0–16.0)
MCH: 27.9 pg (ref 26.0–34.0)
MCHC: 32.9 g/dL (ref 32.0–36.0)
MCV: 84.7 fL (ref 80.0–100.0)
Platelets: 485 10*3/uL — ABNORMAL HIGH (ref 150–440)
RBC: 3.14 MIL/uL — AB (ref 3.80–5.20)
RDW: 18.3 % — ABNORMAL HIGH (ref 11.5–14.5)
WBC: 42.4 10*3/uL — ABNORMAL HIGH (ref 3.6–11.0)

## 2018-06-14 NOTE — Progress Notes (Signed)
[facility] [place] NAME: Xcaret Morad    MR#:  [id number]  DATE OF BIRTH:  10/12/1934  SUBJECTIVE:   Patient here due to suspected sepsis secondary to pneumonia/UTI.  Now having some diarrhea overnight and this morning.  Patient denies any abdominal pain.  He had a fever of 103 this morning which improved with some Tylenol, still has a persistent leukocytosis.  REVIEW OF SYSTEMS:    Review of Systems  Constitutional: Negative for chills and fever.  HENT: Negative for congestion and tinnitus.   Eyes: Negative for blurred vision and double vision.  Respiratory: Negative for cough, shortness of breath and wheezing.   Cardiovascular: Negative for chest pain, orthopnea and PND.  Gastrointestinal: Positive for diarrhea. Negative for abdominal pain, nausea and vomiting.  Genitourinary: Negative for dysuria and hematuria.  Neurological: Negative for dizziness, sensory change and focal weakness.  All other systems reviewed and are negative.   Nutrition: Heart Healthy Tolerating Diet: Yes Tolerating PT: Eval noted.   DRUG ALLERGIES:   Allergies  Allergen Reactions  . Codeine Nausea Only    GI upset  . Cyclobenzaprine     Other reaction(s): Headache  . Methocarbamol     Other reaction(s): Other (See Comments)  . Sulfa Antibiotics Nausea And Vomiting  . Carbamazepine Anxiety  . Gabapentin Anxiety    VITALS:  Blood pressure 124/60, pulse (!) 123, temperature 98.4 F (36.9 C), resp. rate 12, height 5\' 6"  (1.676 m), weight 70.3 kg, SpO2 94 %.  PHYSICAL EXAMINATION:   Physical Exam  GENERAL:  84 y.o.-year-old patient sitting up in chair in no acute distress.  EYES: Pupils equal, round, reactive to light and accommodation. No scleral icterus. Extraocular muscles intact.  HEENT: Head atraumatic, normocephalic. Oropharynx and nasopharynx clear.  NECK:  Supple, no jugular venous distention. No thyroid enlargement, no tenderness.  LUNGS:  Normal breath sounds bilaterally, no wheezing, rales, rhonchi. No use of accessory muscles of respiration.  CARDIOVASCULAR: S1, S2 normal. No murmurs, rubs, or gallops.  ABDOMEN: Soft, nontender, nondistended. Bowel sounds present. No organomegaly or mass.  EXTREMITIES: No cyanosis, clubbing or edema b/l.    NEUROLOGIC: Cranial nerves II through XII are intact. No focal Motor or sensory deficits b/l.  Globally weak.  PSYCHIATRIC: The patient is alert and oriented x 3.  SKIN: No obvious rash, lesion, or ulcer.    LABORATORY PANEL:   CBC Recent Labs  Lab 06/14/18 0339  WBC 42.4*  HGB 8.8*  HCT 26.6*  PLT 485*   ------------------------------------------------------------------------------------------------------------------  Chemistries  Recent Labs  Lab 06/12/2018 1754 06/14/18 0339  NA 131* 134*  K 3.4* 4.0  CL 100 105  CO2 23 21*  GLUCOSE 121* 122*  BUN 30* 30*  CREATININE 1.34* 1.02*  CALCIUM 7.1* 7.3*  AST 21  --   ALT 26  --   ALKPHOS 113  --   BILITOT 0.8  --    ------------------------------------------------------------------------------------------------------------------  Cardiac Enzymes No results for input(s): TROPONINI in the last 168 hours. ------------------------------------------------------------------------------------------------------------------  RADIOLOGY:  Dg Chest 2 View  Result Date: 06/22/2018 CLINICAL DATA:  Possible sepsis EXAM: CHEST - 2 VIEW COMPARISON:  06/03/2018 FINDINGS: Surgical plate and screw fixation of proximal left humerus. Patchy left greater than right bibasilar infiltrates. No significant effusion. Stable cardiomediastinal silhouette with aortic atherosclerosis. No pneumothorax. IMPRESSION: Patchy left greater than right basilar infiltrates. Electronically Signed   By: Donavan Foil M.D.   On: 06/19/2018 18:03   Ct Head  Wo Contrast  Result Date: 06/02/2018 CLINICAL DATA:  Altered LOC EXAM: CT HEAD WITHOUT CONTRAST  TECHNIQUE: Contiguous axial images were obtained from the base of the skull through the vertex without intravenous contrast. COMPARISON:  MRI 08/06/2012 FINDINGS: Brain: No acute territorial infarction, hemorrhage, or intracranial mass. Atrophy with small vessel ischemic changes of the white matter. Prominent ventricles felt secondary to atrophy Vascular: No hyperdense vessels.  Carotid vascular calcification Skull: Normal. Negative for fracture or focal lesion. Sinuses/Orbits: No acute finding. Other: None IMPRESSION: 1. No CT evidence for acute intracranial abnormality. 2. Atrophy with small vessel ischemic changes of the white matter Electronically Signed   By: Donavan Foil M.D.   On: 06/19/2018 17:53     ASSESSMENT AND PLAN:   84 year old female with past medical history of hypertension, hyperlipidemia, osteoarthritis, status post recent right hip hemiarthroplasty who was just discharged to assisted living presents back to the hospital altered mental status, chills and noted to have sepsis secondary to UTI/pneumonia.  1.  Sepsis-patient meets criteria given her leukocytosis, fever and chest x-ray findings suggestive of pneumonia with a mildly abnormal urinalysis. - Continue broad-spectrum IV antibiotics with cefepime, Zithromax. -Follow cultures which are negative so far.  2.  Urinary tract infection-continue IV cefepime, follow urine cultures.  3.  Pneumonia-patient acutely has no respiratory symptoms.  This was incidentally noted on the chest x-ray and admission. -Continue IV cefepime, Zithromax.  Follow cultures.  4.  Diarrhea- acute in nature. -Await stool for comprehensive culture and C. difficile.  5.  Leukocytosis-secondary to the underlying sepsis/UTI/pneumonia. - We will follow with IV antibiotic therapy.  6. Hx of Trigeminal Neuralgia - cont. Carbamazepine.      All the records are reviewed and case discussed with Care Management/Social Worker. Management plans discussed  with the patient, family and they are in agreement.  CODE STATUS: Full code  DVT Prophylaxis: Hep SQ  TOTAL TIME TAKING CARE OF THIS PATIENT: 30 minutes.   POSSIBLE D/C IN 1-2 DAYS, DEPENDING ON CLINICAL CONDITION.   Henreitta Leber M.D on 06/14/2018 at 2:28 PM  Between 7am to 6pm - Pager - [phone]  After 6pm go to [email or web address] - Proofreader  [facility]  Office  [phone]  CC: Primary care physician; Einar Pheasant, MD

## 2018-06-14 NOTE — Evaluation (Signed)
Physical Therapy Evaluation Patient Details Name: Denise Barry MRN: [id number] DOB: 1933-11-21 Today's Date: 06/14/2018   History of Present Illness  84 y.o. female who was here 06/03/18, s/p R hip hemiarthroplasty s/p fall and hip fx,  now returns from [place] with sepsis, weakness, UTI.  History of diverticulitis, hypercholesterolemia, hypertension, osteoarthritis, varicose vein  Clinical Impression  Pt somewhat distracted t/o the PT exam, focused on how weak she fells and that she hasn't eaten.  Encouraged pt to get up to recliner where she could eat but she struggled a lot with the effort.  Of note her HR was 120-130 t/o the session (per nursing this is normal, she did spike to 170s this AM briefly).  Pt was able to participate with ~10 minutes of light supine exercises apart from the PT exam, but ultimately is very weak and limited and especially had a hard time with getting weight forward over walker and then trying to unweight enough to take even very small shuffling steps toward recliner.  Pt very weak, unsafe and at this point completely unsafe and inappropriate to go home, will require continued STR.    Follow Up Recommendations SNF    Equipment Recommendations  None recommended by PT    Recommendations for Other Services       Precautions / Restrictions Precautions Precautions: Fall Restrictions Weight Bearing Restrictions: Yes RLE Weight Bearing: Weight bearing as tolerated(per new ortho orders)      Mobility  Bed Mobility Overal bed mobility: Needs Assistance Bed Mobility: Sit to Supine     Supine to sit: Min assist;Mod assist     General bed mobility comments: Pt was able to scoot toward EOB and lift torso some, but ultimately struggled to maintain sitting even after assistance and constantly stayed leaning to the L  Transfers Overall transfer level: Needs assistance Equipment used: Rolling walker (2 wheeled) Transfers: Sit to/from Stand Sit to Stand: Max  assist         General transfer comment: Pt struggled to shift hips/weight forward and generally needed a lot of cuing and encouragement to get to standing.  Overall weak but showed good effort.  Ambulation/Gait Ambulation/Gait assistance: Max assist Gait Distance (Feet): 2 Feet Assistive device: Rolling walker (2 wheeled)       General Gait Details: Pt unable to really do any ambulation, she did manage to drag R foot forward and attempt to turn but she was very weak, anxious and unsafe.  (apparently she had walked >100 ft at rehab early this week  Stairs            Wheelchair Mobility    Modified Rankin (Stroke Patients Only)       Balance Overall balance assessment: Needs assistance Sitting-balance support: Bilateral upper extremity supported;Feet supported Sitting balance-Leahy Scale: Poor Sitting balance - Comments: pt leaning to the L, unable to maintain upright sitting     Standing balance-Leahy Scale: Poor Standing balance comment: Heavy assist from PT and heavy reliance on the walker, pt with poor safety awareness and was generally unsteady.                             Pertinent Vitals/Pain Pain Assessment: (reports generally feeling poorly, very minimal R hip/sx pain)    Home Living Family/patient expects to be discharged to:: [place]: (pt has been at rehab since sx, will continue)  Prior Function Level of Independence: Needs assistance   Gait / Transfers Assistance Needed: (prior to fall/fx/sx pt was out of the home regularly)           Hand Dominance        Extremity/Trunk Assessment   Upper Extremity Assessment Upper Extremity Assessment: Overall WFL for tasks assessed;Generalized weakness    Lower Extremity Assessment Lower Extremity Assessment: Generalized weakness(R LE strength grossly 3/5, hesitant with most acts)       Communication   Communication: No  difficulties  Cognition Arousal/Alertness: Awake/alert Behavior During Therapy: Anxious Overall Cognitive Status: Within Functional Limits for tasks assessed                                        General Comments      Exercises General Exercises - Lower Extremity Ankle Circles/Pumps: AROM;15 reps Quad Sets: AROM;10 reps Gluteal Sets: AROM;10 reps Heel Slides: AAROM;10 reps Hip ABduction/ADduction: AAROM;10 reps   Assessment/Plan    PT Assessment Patient needs continued PT services  PT Problem List Decreased strength;Decreased range of motion;Decreased activity tolerance;Decreased balance;Decreased mobility;Decreased knowledge of use of DME;Decreased safety awareness;Decreased knowledge of precautions       PT Treatment Interventions DME instruction;Balance training;Gait training;Neuromuscular re-education;Stair training;Functional mobility training;Patient/family education;Therapeutic activities;Therapeutic exercise    PT Goals (Current goals can be found in the Care Plan section)  Acute Rehab PT Goals Patient Stated Goal: get stronger  PT Goal Formulation: With patient Time For Goal Achievement: 06/28/18 Potential to Achieve Goals: Fair    Frequency 7X/week   Barriers to discharge        Co-evaluation               AM-PAC PT "6 Clicks" Daily Activity  Outcome Measure Difficulty turning over in bed (including adjusting bedclothes, sheets and blankets)?: A Lot Difficulty moving from lying on back to sitting on the side of the bed? : Unable Difficulty sitting down on and standing up from a chair with arms (e.g., wheelchair, bedside commode, etc,.)?: Unable Help needed moving to and from a bed to chair (including a wheelchair)?: Total Help needed walking in hospital room?: Total Help needed climbing 3-5 steps with a railing? : Total 6 Click Score: 7    End of Session Equipment Utilized During Treatment: Gait belt Activity Tolerance: Patient  limited by fatigue Patient left: with chair alarm set;with call bell/phone within reach;with family/visitor present Nurse Communication: Mobility status PT Visit Diagnosis: Other abnormalities of gait and mobility (R26.89);History of falling (Z91.81);Muscle weakness (generalized) (M62.81);Unsteadiness on feet (R26.81)    Time: 1779-3903 PT Time Calculation (min) (ACUTE ONLY): 31 min   Charges:   PT Evaluation $PT Eval Low Complexity: 1 Low PT Treatments $Therapeutic Exercise: 8-22 mins       Kreg Shropshire, DPT 06/14/2018, 11:38 AM

## 2018-06-14 NOTE — Progress Notes (Signed)
Clinical Education officer, museum (CSW) met with patient and made her aware that [facility] [place] can't make a bed offer. Patient is agreeable to return to [facility] and doesn't want to look into another facility. Plan is for patient to D/C back to [facility] when medically stable.   [employer], LCSW [phone]

## 2018-06-14 NOTE — Clinical Social Work Note (Signed)
Clinical Social Work Assessment  Patient Details  Name: Denise Barry MRN: [id number] Date of Birth: December 10, 1933  Date of referral:  06/14/18               Reason for consult:  Facility Placement, Other (Comment Required)(From [facility] )                Permission sought to share information with:  Denise Barry granted to share information::  Yes, Verbal Permission Granted  Name::      Denise Barry, minerals::     Relationship::     Contact Information:     Housing/Transportation Living arrangements for the past 2 months:  [place], [place] of Information:  Patient, Adult Children Patient Interpreter Needed:  None Criminal Activity/Legal Involvement Pertinent to Current Situation/Hospitalization:  No - Comment as needed Significant Relationships:  Adult Children Lives with:  Facility Resident, Self Do you feel safe going back to the place where you live?  Yes Need for family participation in patient care:  Yes (Comment)  Care giving concerns:  Patient came to [facility] from [facility] where she was at for short term rehab.    Social Worker assessment / plan:  Denise Barry (Denise Barry) reviewed chart and noted that patient is from [place]. [facility] at [facility] patient can return when stable. CSW met with patient and her son Denise Barry was at bedside. Patient was alert and oriented X4 and was sitting up in the chair. CSW introduced self and explained role of CSW department. Per patient she wants to go to [place]. CSW made patient aware that a referral can be sent to [facility] however they may not have a private room available, which patient needs. Per patient she does not want to look into any other SNF and will go back to [facility] if she can't get into [facility]. FL2 complete and faxed out. Medicare bundle case manager aware of above. CSW will continue to follow and assist as needed.     Employment status:  Retired Forensic scientist:  [employer] PT Recommendations:  [place] / Referral to community resources:  [place]  Patient/Family's Response to care:  Patient requested [place].   Patient/Family's Understanding of and Emotional Response to Diagnosis, Current Treatment, and Prognosis:  Patient and her son were very pleasant and thanked CSW for assistance.   Emotional Assessment Appearance:  Appears stated age Attitude/Demeanor/Rapport:    Affect (typically observed):  Accepting, Adaptable, Pleasant Orientation:  Oriented to Self, Oriented to Place, Oriented to  Time, Oriented to Situation Alcohol / Substance use:  Not Applicable Psych involvement (Current and /or in the community):  No (Comment)  Discharge Needs  Concerns to be addressed:  Discharge Planning Concerns Readmission within the last 30 days:  No Current discharge risk:  Dependent with Mobility Barriers to Discharge:  Continued Medical Work up   [employer], Denise Beets, LCSW 06/14/2018, 2:42 PM

## 2018-06-14 NOTE — Consult Note (Signed)
ORTHOPAEDIC CONSULTATION  REQUESTING PHYSICIAN: Henreitta Leber, MD  Chief Complaint: fever, fatigue  HPI: Denise Barry is a 84 y.o. female who presents with fever 2 weeks after hemiarthroplasty for hip fracture with Dr. Sabra Heck. History is obtained primarily from the chart and her family present in the room. Please see ED notes and H&P for details.  Past Medical History:  Diagnosis Date  . Allergy   . Diverticulitis    11/18  . Hypercholesterolemia   . Hypertension   . Osteoarthritis   . Urinary incontinence    pessary in place  . Varicose veins    H/O   Past Surgical History:  Procedure Laterality Date  . BACK SURGERY  09/16/13  . DILATION AND CURETTAGE OF UTERUS  1971  . HIP ARTHROPLASTY Right 06/04/2018   Procedure: ARTHROPLASTY BIPOLAR HIP (HEMIARTHROPLASTY);  Surgeon: Earnestine Leys, MD;  Location: [facility] ORS;  Service: Orthopedics;  Laterality: Right;  . JOINT REPLACEMENT    . sclerosis  2000  . TUBAL LIGATION  1972   Social History   Socioeconomic History  . Marital status: Widowed    Spouse name: Not on file  . Number of children: Not on file  . Years of education: Not on file  . Highest education level: Not on file  Occupational History  . Not on file  Social Needs  . Financial resource strain: Not on file  . Food insecurity:    Worry: Not on file    Inability: Not on file  . Transportation needs:    Medical: Not on file    Non-medical: Not on file  Tobacco Use  . Smoking status: Former Research scientist (life sciences)  . Smokeless tobacco: Never Used  Substance and Sexual Activity  . Alcohol use: No    Alcohol/week: 0.0 standard drinks  . Drug use: No  . Sexual activity: Never  Lifestyle  . Physical activity:    Days per week: Not on file    Minutes per session: Not on file  . Stress: Not on file  Relationships  . Social connections:    Talks on phone: Not on file    Gets together: Not on file    Attends religious service: Not on file    Active member of  club or organization: Not on file    Attends meetings of clubs or organizations: Not on file    Relationship status: Not on file  Other Topics Concern  . Not on file  Social History Narrative  . Not on file   Family History  Problem Relation Age of Onset  . Heart disease Mother   . Diabetes Mother   . Hypertension Mother   . Heart disease Father   . Stroke Father   . Cancer Son        bladder  . COPD Son   . Breast cancer Neg Hx    Allergies  Allergen Reactions  . Codeine Nausea Only    GI upset  . Cyclobenzaprine     Other reaction(s): Headache  . Methocarbamol     Other reaction(s): Other (See Comments)  . Sulfa Antibiotics Nausea And Vomiting  . Carbamazepine Anxiety  . Gabapentin Anxiety   Prior to Admission medications   Medication Sig Start Date End Date Taking? Authorizing Provider  acetaminophen (TYLENOL) 325 MG tablet Take 2 tablets (650 mg total) by mouth every 6 (six) hours as needed for mild pain or fever (PAIN). 06/06/18  Yes Loletha Grayer, MD  amLODipine (NORVASC) 10 MG  tablet Take 1 tablet (10 mg total) by mouth daily. 11/24/17  Yes Einar Pheasant, MD  aspirin EC 325 MG EC tablet Take 1 tablet (325 mg total) by mouth 2 (two) times daily. 06/07/18 07/17/18 Yes Wieting, Richard, MD  cefTRIAXone 1 g in sodium chloride 0.9 % 100 mL Inject 1 g into the vein daily. Mixed with lidocaine 06/12/2018 06/20/18 Yes [provider]  docusate sodium (COLACE) 100 MG capsule Take 100 mg by mouth daily.   Yes [provider]  Eslicarbazepine Acetate 400 MG TABS Take 400 mg by mouth 3 (three) times daily.    Yes [provider]  Fe Fum-FePoly-Vit C-Vit B3 (INTEGRA) 62.5-62.5-40-3 MG CAPS Take one tablet daily Patient taking differently: Take 1 capsule by mouth daily.  02/26/18  Yes Einar Pheasant, MD  HYDROcodone-acetaminophen (NORCO/VICODIN) 5-325 MG tablet Take 1 tablet by mouth every 6 (six) hours as needed for moderate pain (pain score 4-6). 06/06/18  Yes  Wieting, Richard, MD  Multiple Vitamin (MULTI-VITAMINS) TABS Take 1 tablet by mouth daily.    Yes [provider]  traMADol (ULTRAM) 50 MG tablet Take 1 tablet (50 mg total) by mouth every 6 (six) hours as needed for moderate pain. 06/06/18  Yes Wieting, Richard, MD  promethazine (PHENERGAN) 25 MG/ML injection Inject 12.5 mg into the muscle once.    [provider]  triamcinolone cream (KENALOG) 0.1 % Apply 1 application topically 2 (two) times daily. Patient not taking: Reported on 06/25/2018 04/24/18   Einar Pheasant, MD   Dg Chest 2 View  Result Date: 06/20/2018 CLINICAL DATA:  Possible sepsis EXAM: CHEST - 2 VIEW COMPARISON:  06/03/2018 FINDINGS: Surgical plate and screw fixation of proximal left humerus. Patchy left greater than right bibasilar infiltrates. No significant effusion. Stable cardiomediastinal silhouette with aortic atherosclerosis. No pneumothorax. IMPRESSION: Patchy left greater than right basilar infiltrates. Electronically Signed   By: Donavan Foil M.D.   On: 06/25/2018 18:03   Ct Head Wo Contrast  Result Date: 06/26/2018 CLINICAL DATA:  Altered LOC EXAM: CT HEAD WITHOUT CONTRAST TECHNIQUE: Contiguous axial images were obtained from the base of the skull through the vertex without intravenous contrast. COMPARISON:  MRI 08/06/2012 FINDINGS: Brain: No acute territorial infarction, hemorrhage, or intracranial mass. Atrophy with small vessel ischemic changes of the white matter. Prominent ventricles felt secondary to atrophy Vascular: No hyperdense vessels.  Carotid vascular calcification Skull: Normal. Negative for fracture or focal lesion. Sinuses/Orbits: No acute finding. Other: None IMPRESSION: 1. No CT evidence for acute intracranial abnormality. 2. Atrophy with small vessel ischemic changes of the white matter Electronically Signed   By: Donavan Foil M.D.   On: 06/28/2018 17:53    Positive ROS: All other systems have been reviewed and were otherwise negative  with the exception of those mentioned in the HPI and as above.  Physical Exam: General: Alert, no acute distress Cardiovascular: No pedal edema Respiratory: No cyanosis, no use of accessory musculature GI: No organomegaly, abdomen is soft and non-tender Skin: No lesions in the area of chief complaint Neurologic: Sensation intact distally Psychiatric: Patient is not competent for consent  Lymphatic: No axillary or cervical lymphadenopathy  MUSCULOSKELETAL: incision is clean, dry and intact. Staples are present. No erythema or drainage. +DF/PF/EHL. Compartments soft. Good cap refill  Assessment: 1. Cystitis   2. Pneumonia of both lower lobes due to infectious organism ([place])   3. Sepsis, due to unspecified organism [facility])   right hip hemiarthroplasty  Plan: Plan per primary team. Hip incision  is healing well. Will follow-up with Dr. Sabra Heck in 1 week for staple removal. Case discussed with Dr. Sabra Heck. Please call with questions.    Lovell Sheehan, MD    06/14/2018 11:14 PM

## 2018-06-14 NOTE — NC FL2 (Signed)
[place] LEVEL OF CARE SCREENING TOOL     IDENTIFICATION  Patient Name: Denise Barry Birthdate: 02/23/1934 Sex: female Admission Date (Current Location): 06/06/2018  [place] and [place] Number:  Engineering geologist and Address:  [facility], [address] [place], [place] [address]      Provider Number: [id number]  Attending Physician Name and Address:  Henreitta Leber, MD  Relative Name and Phone Number:       Current Level of Care: Hospital Recommended Level of Care: [place] Prior Approval Number:    Date Approved/Denied:   PASRR Number: [id number])  Discharge Plan: SNF    Current Diagnoses: Patient Active Problem List   Diagnosis Date Noted  . Sepsis ([place]) 06/11/2018  . Acute lower UTI 06/24/2018  . Hip fracture, unspecified laterality, closed, initial encounter ([place]) 06/03/2018  . Hip fracture ([place]) 06/03/2018  . Chronic venous insufficiency 05/20/2018  . Lymphedema 04/24/2018  . Spondylosis without myelopathy or radiculopathy, lumbosacral region 12/21/2017  . Other specified dorsopathies, sacral and sacrococcygeal region 12/21/2017  . Difficulty sleeping 10/22/2017  . Diverticulitis   . Abdominal pain 10/05/2017  . Elevated WBC count 10/05/2017  . Granulocytosis 10/05/2017  . Diverticulitis of large intestine with perforation 10/05/2017  . Chronic musculoskeletal pain 09/13/2017  . Lumbar Spondylosis 09/13/2017  . Lumbar facet syndrome (Bilateral) (L>R) 09/13/2017  . Chronic sacroiliac joint pain (Bilateral) (L>R) 09/13/2017  . Old Compression fracture of L1 and L2 09/12/2017  . DDD (degenerative disc disease), thoracic 09/12/2017  . Scoliosis 09/12/2017  . Abnormal MRI, lumbar spine (07/25/2017) 09/12/2017  . Chronic low back pain (Primary Area of Pain)  (Bilateral) (L>R) 08/28/2017  . Chronic pain syndrome 08/28/2017  . Opiate use 08/28/2017  . Other long term (current) drug therapy  08/28/2017  . Other specified health status 08/28/2017  . Other reduced mobility 08/28/2017  . Disorder of bone, unspecified 08/28/2017  . Syncope 07/08/2017  . Anemia 06/10/2017  . SUNCT (short unilateral neuralgiform headache, conjunctival inj/tear) 07/09/2015  . History of shoulder fracture (Left) 06/03/2015  . Hyponatremia 05/07/2015  . Closed 2-part displaced fracture of surgical neck of left humerus 02/09/2015  . Health care maintenance 01/18/2015  . History of hip fracture (Left) 07/13/2014  . Chronic Trigeminal neuralgia (Right) 01/07/2014  . Essential hypertension, benign 01/07/2014  . Hypercholesterolemia 01/07/2014  . Failed back surgical syndrome (L4-5 fusion) 01/07/2014  . Urinary incontinence 01/07/2014  . Acquired spondylolisthesis 09/13/2013  . Spinal stenosis, unspecified region other than cervical 09/13/2013  . DDD (degenerative disc disease), lumbar 06/05/2013    Orientation RESPIRATION BLADDER Height & Weight     Self, Time, Situation, Place  O2(2 Liters Oxygen. ) Incontinent Weight: 155 lb (70.3 kg) Height:  5\' 6"  (167.6 cm)  BEHAVIORAL SYMPTOMS/MOOD NEUROLOGICAL BOWEL NUTRITION STATUS      Incontinent Diet(Diet: Heart Healthy )  AMBULATORY STATUS COMMUNICATION OF NEEDS Skin   Extensive Assist Verbally Surgical wounds(Incision: Right Hip. )                       Personal Care Assistance Level of Assistance  Bathing, Feeding, Dressing Bathing Assistance: Limited assistance Feeding assistance: Independent Dressing Assistance: Limited assistance     Functional Limitations Info  Sight, Hearing, Speech Sight Info: Adequate Hearing Info: Adequate Speech Info: Adequate    SPECIAL CARE FACTORS FREQUENCY  PT (By licensed PT), OT (By licensed OT)     PT Frequency: (5) OT Frequency: (5)  Contractures      Additional Factors Info  Code Status, Allergies, Isolation Precautions Code Status Info: (Full Code. ) Allergies Info:  (Codeine, Cyclobenzaprine, Methocarbamol, Sulfa Antibiotics, Carbamazepine, Gabapentin)     Isolation Precautions Info: (Enteric precations. Possible C-diff. )     Current Medications (06/14/2018):  This is the current hospital active medication list Current Facility-Administered Medications  Medication Dose Route Frequency Provider Last Rate Last Dose  . 0.9 %  sodium chloride infusion   Intravenous Continuous Vaughan Basta, MD 75 mL/hr at 06/14/18 0903    . acetaminophen (TYLENOL) tablet 650 mg  650 mg Oral Q6H PRN Vaughan Basta, MD   650 mg at 06/14/18 0902  . aspirin EC tablet 325 mg  325 mg Oral BID Vaughan Basta, MD   325 mg at 06/14/18 0817  . ceFEPIme (MAXIPIME) 2 g in sodium chloride 0.9 % 100 mL IVPB  2 g Intravenous Q24H Carrie Mew, MD      . docusate sodium (COLACE) capsule 100 mg  100 mg Oral Daily Vaughan Basta, MD   100 mg at 06/14/18 0817  . docusate sodium (COLACE) capsule 100 mg  100 mg Oral BID PRN Vaughan Basta, MD      . Eslicarbazepine Acetate TABS 400 mg  400 mg Oral TID Vaughan Basta, MD   400 mg at 06/14/18 0816  . ferrous [id number] C-folic acid (TRINSICON / FOLTRIN) capsule 1 capsule  1 capsule Oral Daily Vaughan Basta, MD   1 capsule at 06/14/18 0817  . heparin injection 5,000 Units  5,000 Units Subcutaneous Q8H Vaughan Basta, MD   5,000 Units at 06/14/18 0456  . HYDROcodone-acetaminophen (NORCO/VICODIN) 5-325 MG per tablet 1 tablet  1 tablet Oral Q6H PRN Vaughan Basta, MD      . multivitamin with minerals tablet 1 tablet  1 tablet Oral Daily Vaughan Basta, MD   1 tablet at 06/14/18 0816  . potassium chloride SA (K-DUR,KLOR-CON) CR tablet 20 mEq  20 mEq Oral BID Vaughan Basta, MD   20 mEq at 06/14/18 0816  . promethazine (PHENERGAN) injection 12.5 mg  12.5 mg Intramuscular Once Vaughan Basta, MD         Discharge Medications: Please see  discharge summary for a list of discharge medications.  Relevant Imaging Results:  Relevant Lab Results:   Additional Information (SSN: [id number])  Demarr Kluever, [place] Beets, LCSW

## 2018-06-15 ENCOUNTER — Inpatient Hospital Stay: Payer: Medicare Other

## 2018-06-15 ENCOUNTER — Inpatient Hospital Stay: Payer: Self-pay

## 2018-06-15 LAB — CBC
HCT: 33.8 % — ABNORMAL LOW (ref 35.0–47.0)
HEMOGLOBIN: 11 g/dL — AB (ref 12.0–16.0)
MCH: 27.7 pg (ref 26.0–34.0)
MCHC: 32.6 g/dL (ref 32.0–36.0)
MCV: 84.9 fL (ref 80.0–100.0)
PLATELETS: 606 10*3/uL — AB (ref 150–440)
RBC: 3.98 MIL/uL (ref 3.80–5.20)
RDW: 18.2 % — ABNORMAL HIGH (ref 11.5–14.5)
WBC: 51.8 10*3/uL — AB (ref 3.6–11.0)

## 2018-06-15 LAB — C DIFFICILE QUICK SCREEN W PCR REFLEX
C DIFFICILE (CDIFF) TOXIN: POSITIVE — AB
C DIFFICLE (CDIFF) ANTIGEN: POSITIVE — AB
C Diff interpretation: DETECTED

## 2018-06-15 LAB — BASIC METABOLIC PANEL
ANION GAP: 8 (ref 5–15)
BUN: 40 mg/dL — ABNORMAL HIGH (ref 8–23)
CHLORIDE: 104 mmol/L (ref 98–111)
CO2: 18 mmol/L — ABNORMAL LOW (ref 22–32)
Calcium: 7.7 mg/dL — ABNORMAL LOW (ref 8.9–10.3)
Creatinine, Ser: 2.08 mg/dL — ABNORMAL HIGH (ref 0.44–1.00)
GFR calc Af Amer: 24 mL/min — ABNORMAL LOW (ref >60)
GFR, EST NON AFRICAN AMERICAN: 21 mL/min — AB (ref >60)
Glucose, Bld: 172 mg/dL — ABNORMAL HIGH (ref 70–99)
POTASSIUM: 4.5 mmol/L (ref 3.5–5.1)
SODIUM: 130 mmol/L — AB (ref 135–145)

## 2018-06-15 LAB — URINE CULTURE: CULTURE: NO GROWTH

## 2018-06-15 MED ORDER — METRONIDAZOLE IN NACL 5-0.79 MG/ML-% IV SOLN
500.0000 mg | Freq: Three times a day (TID) | INTRAVENOUS | Status: DC
  Administered 2018-06-15 – 2018-06-16 (×3): 500 mg via INTRAVENOUS
  Filled 2018-06-15 (×5): qty 100

## 2018-06-15 MED ORDER — ASPIRIN EC 325 MG PO TBEC: 325.0000 mg | DELAYED_RELEASE_TABLET | Freq: Every day | ORAL | Status: DC

## 2018-06-15 MED ORDER — SODIUM CHLORIDE 0.9 % IV SOLN
INTRAVENOUS | Status: DC
  Administered 2018-06-15 (×2): via INTRAVENOUS

## 2018-06-15 MED ORDER — VANCOMYCIN 50 MG/ML ORAL SOLUTION
125.0000 mg | Freq: Four times a day (QID) | ORAL | Status: DC
  Administered 2018-06-15: 125 mg via ORAL
  Filled 2018-06-15 (×4): qty 2.5

## 2018-06-15 MED ORDER — VANCOMYCIN 50 MG/ML ORAL SOLUTION
500.0000 mg | Freq: Four times a day (QID) | ORAL | Status: DC
  Administered 2018-06-15: 500 mg via ORAL
  Filled 2018-06-15 (×6): qty 10

## 2018-06-15 MED ORDER — VANCOMYCIN 50 MG/ML ORAL SOLUTION
125.0000 mg | Freq: Four times a day (QID) | ORAL | Status: DC
  Administered 2018-06-15: 125 mg via ORAL
  Filled 2018-06-15: qty 2.5

## 2018-06-15 MED ORDER — ONDANSETRON HCL 4 MG/2ML IJ SOLN
4.0000 mg | Freq: Four times a day (QID) | INTRAMUSCULAR | Status: DC | PRN
  Administered 2018-06-15: 4 mg via INTRAVENOUS
  Filled 2018-06-15 (×2): qty 2

## 2018-06-15 MED ORDER — VANCOMYCIN HCL 500 MG IV SOLR
500.0000 mg | Freq: Four times a day (QID) | Status: DC
  Filled 2018-06-15 (×2): qty 500

## 2018-06-15 MED ORDER — PANTOPRAZOLE SODIUM 40 MG PO TBEC
40.0000 mg | DELAYED_RELEASE_TABLET | Freq: Two times a day (BID) | ORAL | Status: DC
Start: 2018-06-15 — End: 2018-06-16
  Administered 2018-06-15: 40 mg via ORAL
  Filled 2018-06-15: qty 1

## 2018-06-15 NOTE — Progress Notes (Signed)
Per MD patient will not D/C this weekend. Plan is for patient to D/C back to [facility]. [facility] admissions coordinator at [facility] is aware of above.   [employer], LCSW [phone]

## 2018-06-15 NOTE — Consult Note (Signed)
SURGICAL CONSULTATION NOTE   HISTORY OF PRESENT ILLNESS (HPI):  84 y.o. female admitted due to suspected pneumonia and UTI. She had recent orthopaedic surgery.  In the past few days she started to feel weak and confused. She was brought to the hospital and admitted due to suspected pneumonic process. She was started on cefepime and azithromycin. Patient was not responding well to therapy and WBC started to increase significantly and developed diarrhea. Stool was cultured and was positive for C diff. Patient refers mild abdominal pain. No pain radiation. Nothing improves or aggravates the pain. She does refers feeling bad generally.  Patient refers that diarrhea is a little bit better. She was started with oral Vanco today at 1pm. Abdominal xray was done and reviewed personally. There is no large intestine dilation.  Surgery is consulted by Dr. Verdell Carmine in this context for evaluation and management of severe c diff colitis.  PAST MEDICAL HISTORY (PMH):  Past Medical History:  Diagnosis Date  . Allergy   . Diverticulitis    11/18  . Hypercholesterolemia   . Hypertension   . Osteoarthritis   . Urinary incontinence    pessary in place  . Varicose veins    H/O     PAST SURGICAL HISTORY ([place]):  Past Surgical History:  Procedure Laterality Date  . BACK SURGERY  09/16/13  . DILATION AND CURETTAGE OF UTERUS  1971  . HIP ARTHROPLASTY Right 06/04/2018   Procedure: ARTHROPLASTY BIPOLAR HIP (HEMIARTHROPLASTY);  Surgeon: Earnestine Leys, MD;  Location: [facility] ORS;  Service: Orthopedics;  Laterality: Right;  . JOINT REPLACEMENT    . sclerosis  2000  . TUBAL LIGATION  1972     MEDICATIONS:  Prior to Admission medications   Medication Sig Start Date End Date Taking? Authorizing Provider  acetaminophen (TYLENOL) 325 MG tablet Take 2 tablets (650 mg total) by mouth every 6 (six) hours as needed for mild pain or fever (PAIN). 06/06/18  Yes Wieting, Richard, MD  amLODipine (NORVASC) 10 MG tablet Take 1  tablet (10 mg total) by mouth daily. 11/24/17  Yes Einar Pheasant, MD  aspirin EC 325 MG EC tablet Take 1 tablet (325 mg total) by mouth 2 (two) times daily. 06/07/18 07/17/18 Yes Wieting, Richard, MD  cefTRIAXone 1 g in sodium chloride 0.9 % 100 mL Inject 1 g into the vein daily. Mixed with lidocaine 06/18/2018 06/20/18 Yes [provider]  docusate sodium (COLACE) 100 MG capsule Take 100 mg by mouth daily.   Yes [provider]  Eslicarbazepine Acetate 400 MG TABS Take 400 mg by mouth 3 (three) times daily.    Yes [provider]  Fe Fum-FePoly-Vit C-Vit B3 (INTEGRA) 62.5-62.5-40-3 MG CAPS Take one tablet daily Patient taking differently: Take 1 capsule by mouth daily.  02/26/18  Yes Einar Pheasant, MD  HYDROcodone-acetaminophen (NORCO/VICODIN) 5-325 MG tablet Take 1 tablet by mouth every 6 (six) hours as needed for moderate pain (pain score 4-6). 06/06/18  Yes Wieting, Richard, MD  Multiple Vitamin (MULTI-VITAMINS) TABS Take 1 tablet by mouth daily.    Yes [provider]  traMADol (ULTRAM) 50 MG tablet Take 1 tablet (50 mg total) by mouth every 6 (six) hours as needed for moderate pain. 06/06/18  Yes Wieting, Richard, MD  promethazine (PHENERGAN) 25 MG/ML injection Inject 12.5 mg into the muscle once.    [provider]  triamcinolone cream (KENALOG) 0.1 % Apply 1 application topically 2 (two) times daily. Patient not taking: Reported on 06/17/2018 04/24/18   Nicki Reaper,  Randell Patient, MD     ALLERGIES:  Allergies  Allergen Reactions  . Codeine Nausea Only    GI upset  . Cyclobenzaprine     Other reaction(s): Headache  . Methocarbamol     Other reaction(s): Other (See Comments)  . Sulfa Antibiotics Nausea And Vomiting  . Carbamazepine Anxiety  . Gabapentin Anxiety     SOCIAL HISTORY:  Social History   Socioeconomic History  . Marital status: Widowed    Spouse name: Not on file  . Number of children: Not on file  . Years of education: Not on file  .  Highest education level: Not on file  Occupational History  . Not on file  Social Needs  . Financial resource strain: Not on file  . Food insecurity:    Worry: Not on file    Inability: Not on file  . Transportation needs:    Medical: Not on file    Non-medical: Not on file  Tobacco Use  . Smoking status: Former Research scientist (life sciences)  . Smokeless tobacco: Never Used  Substance and Sexual Activity  . Alcohol use: No    Alcohol/week: 0.0 standard drinks  . Drug use: No  . Sexual activity: Never  Lifestyle  . Physical activity:    Days per week: Not on file    Minutes per session: Not on file  . Stress: Not on file  Relationships  . Social connections:    Talks on phone: Not on file    Gets together: Not on file    Attends religious service: Not on file    Active member of club or organization: Not on file    Attends meetings of clubs or organizations: Not on file    Relationship status: Not on file  . Intimate partner violence:    Fear of current or ex partner: Not on file    Emotionally abused: Not on file    Physically abused: Not on file    Forced sexual activity: Not on file  Other Topics Concern  . Not on file  Social History Narrative  . Not on file     FAMILY HISTORY:  Family History  Problem Relation Age of Onset  . Heart disease Mother   . Diabetes Mother   . Hypertension Mother   . Heart disease Father   . Stroke Father   . Cancer Son        bladder  . COPD Son   . Breast cancer Neg Hx      REVIEW OF SYSTEMS:  Constitutional: denies weight loss, fever, Positive for chills, sweats and malaise  Eyes: denies any other vision changes, history of eye injury  ENT: denies sore throat, hearing problems  Respiratory: denies shortness of breath, wheezing  Cardiovascular: denies chest pain, palpitations  Gastrointestinal: positive abdominal pain, N/V, and diarrhea Genitourinary: positive burning with urination  Musculoskeletal: denies any other joint pains or cramps   Skin: denies any other rashes or skin discolorations  Neurological: denies any other headache, dizziness, Positive for weakness  Psychiatric: denies any other depression, anxiety   All other review of systems were negative   VITAL SIGNS:  Temp:  [98.4 F (36.9 C)-98.7 F (37.1 C)] 98.7 F (37.1 C) (08/16 0805) Pulse Rate:  [85-98] 98 (08/16 1124) Resp:  [17-18] 18 (08/16 0805) BP: (118-141)/(54-73) 141/73 (08/16 1124) SpO2:  [97 %-100 %] 97 % (08/16 1124) FiO2 (%):  [2 %] 2 % (08/16 0104)     Height: 5\' 6"  (167.6 cm)  Weight: 70.3 kg BMI (Calculated): 25.03   INTAKE/OUTPUT:  This shift: No intake/output data recorded.  Last 2 shifts: @IOLAST2SHIFTS @   PHYSICAL EXAM:  Constitutional:  -- Normal body habitus  -- Awake, alert, and oriented x3  Eyes:  -- Pupils equally round and reactive to light  -- No scleral icterus  Ear, nose, and throat:  -- No jugular venous distension  Pulmonary:  -- bilateral crackles, no wheezing -- Equal breath sounds bilaterally -- Breathing non-labored at rest Cardiovascular:  -- S1, S2 present  -- No pericardial rubs Gastrointestinal:  -- Abdomen soft, mild tender generalized, non-distended, no guarding or rebound tenderness -- No abdominal masses appreciated, pulsatile or otherwise  Musculoskeletal and Integumentary:  -- Wounds or skin discoloration: None appreciated -- Extremities: B/L UE and LE FROM, hands and feet warm, no edema  Neurologic:  -- Motor function: intact and symmetric -- Sensation: intact and symmetric   Labs:  CBC Latest Ref Rng & Units 06/15/2018 06/14/2018 06/02/2018  WBC 3.6 - 11.0 K/uL 51.8(HH) 42.4(H) 35.2(H)  Hemoglobin 12.0 - 16.0 g/dL 11.0(L) 8.8(L) 8.1(L)  Hematocrit 35.0 - 47.0 % 33.8(L) 26.6(L) 24.5(L)  Platelets 150 - 440 K/uL 606(H) 485(H) 397   CMP Latest Ref Rng & Units 06/15/2018 06/14/2018 06/24/2018  Glucose 70 - 99 mg/dL 172(H) 122(H) 121(H)  BUN 8 - 23 mg/dL 40(H) 30(H) 30(H)  Creatinine 0.44 - 1.00  mg/dL 2.08(H) 1.02(H) 1.34(H)  Sodium 135 - 145 mmol/L 130(L) 134(L) 131(L)  Potassium 3.5 - 5.1 mmol/L 4.5 4.0 3.4(L)  Chloride 98 - 111 mmol/L 104 105 100  CO2 22 - 32 mmol/L 18(L) 21(L) 23  Calcium 8.9 - 10.3 mg/dL 7.7(L) 7.3(L) 7.1(L)  Total Protein 6.5 - 8.1 g/dL - - 5.1(L)  Total Bilirubin 0.3 - 1.2 mg/dL - - 0.8  Alkaline Phos 38 - 126 U/L - - 113  AST 15 - 41 U/L - - 21  ALT 0 - 44 U/L - - 26    Imaging studies:  EXAM: ABDOMEN - 1 VIEW  COMPARISON:  CT 01/03/2018  FINDINGS: Few mildly prominent small bowel loops in the left abdomen, nonspecific. No organomegaly or free air. Bilateral hip replacements. No acute bony abnormality. Postoperative changes in the lower lumbar spine.  IMPRESSION: Few mildly prominent left abdominal small bowel loops. This may reflect focal ileus, less likely early low grade small bowel obstruction. No free air.   Electronically Signed   By: Rolm Baptise M.D.   On: 06/15/2018 11:35  Assessment/Plan:  84 y.o. female with severe c diff colitis complicated with sepsis and hypertension, recent orthopaedic surgery.  Upon evaluation of physical exam, labs, vital signs and images patient consistent with severe c diff colitis. Patient currelty with adequate vital signs, no fever, no tachycardia, no respiratory distress. Physical exam not worrisome for acute abdomen at this moment. Agree with severity of infection with significant increase of WBC count. Patient feeling weak but alert and oriented. I had a long discussion with the family of the labs and imaging finding. I also discussed my role as the surgeon to be aware in case patient needs surgical management. I oriented patient and family member (daughter, son, Arville Go) that if patient needs surgery it will be a subtotal colectomy with end ileostomy. At the moment of this evaluation the patient was asked if she understand,and she refers that she does and that she does not want to have any  surgical management. I asked her, that if in the case she does not  respond the antibiotic therapy and surgical therapy is needed as last resource, if she would want surgical therapy and she refers that she does not want surgery. I told her that she should discuss decision with family member and in my subsequent visits, will discuss further.   All of the above findings and recommendations were discussed with the patient and her family, and all of patient's and her family's questions were answered to their expressed satisfaction.  Arnold Long, MD

## 2018-06-15 NOTE — Progress Notes (Signed)
Contacted by MD asking about antibiotics. Notified MD that patient had been positive for CDiff. He wanted to order Oral Lucianne Lei and D/C Cefipime. Orders entered.

## 2018-06-15 NOTE — Progress Notes (Signed)
We have still been unable to gain IV access after several attempts. MD aware. Spoke with IV nurse and notified her of situation and need for IV/PICC access to give IV antibiotics and fluids for sepsis. She could not give a time when she would be able to come. MD notified.

## 2018-06-15 NOTE — Plan of Care (Signed)
  Problem: Education: Goal: Knowledge of General Education information will improve Description Including pain rating scale, medication(s)/side effects and non-pharmacologic comfort measures Outcome: Progressing   Problem: Health Behavior/Discharge Planning: Goal: Ability to manage health-related needs will improve Outcome: Progressing   Problem: Clinical Measurements: Goal: Ability to maintain clinical measurements within normal limits will improve Outcome: Progressing Goal: Will remain free from infection Outcome: Progressing Goal: Diagnostic test results will improve Outcome: Progressing Goal: Respiratory complications will improve Outcome: Progressing Goal: Cardiovascular complication will be avoided Outcome: Progressing   Problem: Coping: Goal: Level of anxiety will decrease Outcome: Progressing   Problem: Nutrition: Goal: Adequate nutrition will be maintained Outcome: Progressing   Problem: Elimination: Goal: Will not experience complications related to bowel motility Outcome: Progressing Goal: Will not experience complications related to urinary retention Outcome: Progressing   

## 2018-06-15 NOTE — Progress Notes (Signed)
[facility] [place] NAME: Denise Barry    MR#:  [id number]  DATE OF BIRTH:  26-Feb-1934  SUBJECTIVE:   Patient here due to suspected sepsis initially thought to be secondary to UTI/pneumonia, although was having diarrhea and now positive for C. difficile.  The source of patient's sepsis is likely C. difficile diarrhea.  Patient taken off all antibiotics and started on oral vancomycin.  Did not have any IV access earlier but now have obtain IV access and started on IV fluid hydration and kept n.p.o.  White Cell count has dramatically increased, although patient remains hemodynamically stable.  REVIEW OF SYSTEMS:    Review of Systems  Constitutional: Negative for chills and fever.  HENT: Negative for congestion and tinnitus.   Eyes: Negative for blurred vision and double vision.  Respiratory: Negative for cough, shortness of breath and wheezing.   Cardiovascular: Negative for chest pain, orthopnea and PND.  Gastrointestinal: Positive for diarrhea. Negative for abdominal pain, nausea and vomiting.  Genitourinary: Negative for dysuria and hematuria.  Neurological: Negative for dizziness, sensory change and focal weakness.  All other systems reviewed and are negative.   Nutrition: NPO Tolerating Diet: No Tolerating PT: Eval noted.   DRUG ALLERGIES:   Allergies  Allergen Reactions  . Codeine Nausea Only    GI upset  . Cyclobenzaprine     Other reaction(s): Headache  . Methocarbamol     Other reaction(s): Other (See Comments)  . Sulfa Antibiotics Nausea And Vomiting  . Carbamazepine Anxiety  . Gabapentin Anxiety    VITALS:  Blood pressure (!) 141/73, pulse 98, temperature 98.7 F (37.1 C), resp. rate 18, height _0  (1.676 m), weight 70.3 kg, SpO2 97 %.  PHYSICAL EXAMINATION:   Physical Exam  GENERAL:  84 y.o.-year-old patient lying in bed in mild distress.  EYES: Pupils equal, round, reactive to light and accommodation. No  scleral icterus. Extraocular muscles intact.  HEENT: Head atraumatic, normocephalic. Oropharynx and nasopharynx clear.  NECK:  Supple, no jugular venous distention. No thyroid enlargement, no tenderness.  LUNGS: Normal breath sounds bilaterally, no wheezing, rales, rhonchi. No use of accessory muscles of respiration.  CARDIOVASCULAR: S1, S2 normal. No murmurs, rubs, or gallops.  ABDOMEN: Soft, nontender, slightly distended. Bowel sounds present. No organomegaly or mass.  EXTREMITIES: No cyanosis, clubbing or edema b/l.    NEUROLOGIC: Cranial nerves II through XII are intact. No focal Motor or sensory deficits b/l.  Globally weak.  PSYCHIATRIC: The patient is alert and oriented x 3.  SKIN: No obvious rash, lesion, or ulcer.    LABORATORY PANEL:   CBC Recent Labs  Lab 06/15/18 0448  WBC 51.8*  HGB 11.0*  HCT 33.8*  PLT 606*   ------------------------------------------------------------------------------------------------------------------  Chemistries  Recent Labs  Lab 06/22/2018 1754  06/15/18 0448  NA 131*   < > 130*  K 3.4*   < > 4.5  CL 100   < > 104  CO2 23   < > 18*  GLUCOSE 121*   < > 172*  BUN 30*   < > 40*  CREATININE 1.34*   < > 2.08*  CALCIUM 7.1*   < > 7.7*  AST 21  --   --   ALT 26  --   --   ALKPHOS 113  --   --   BILITOT 0.8  --   --    < > = values in this interval not displayed.   ------------------------------------------------------------------------------------------------------------------  Cardiac Enzymes No results for input(s): TROPONINI in the last 168 hours. ------------------------------------------------------------------------------------------------------------------  RADIOLOGY:  Dg Chest 2 View  Result Date: 05/31/2018 CLINICAL DATA:  Possible sepsis EXAM: CHEST - 2 VIEW COMPARISON:  06/03/2018 FINDINGS: Surgical plate and screw fixation of proximal left humerus. Patchy left greater than right bibasilar infiltrates. No significant  effusion. Stable cardiomediastinal silhouette with aortic atherosclerosis. No pneumothorax. IMPRESSION: Patchy left greater than right basilar infiltrates. Electronically Signed   By: Donavan Foil M.D.   On: 06/06/2018 18:03   Dg Abd 1 View  Result Date: 06/15/2018 CLINICAL DATA:  Nausea, diarrhea EXAM: ABDOMEN - 1 VIEW COMPARISON:  CT 01/03/2018 FINDINGS: Few mildly prominent small bowel loops in the left abdomen, nonspecific. No organomegaly or free air. Bilateral hip replacements. No acute bony abnormality. Postoperative changes in the lower lumbar spine. IMPRESSION: Few mildly prominent left abdominal small bowel loops. This may reflect focal ileus, less likely early low grade small bowel obstruction. No free air. Electronically Signed   By: Rolm Baptise M.D.   On: 06/15/2018 11:35   Ct Head Wo Contrast  Result Date: 06/06/2018 CLINICAL DATA:  Altered LOC EXAM: CT HEAD WITHOUT CONTRAST TECHNIQUE: Contiguous axial images were obtained from the base of the skull through the vertex without intravenous contrast. COMPARISON:  MRI 08/06/2012 FINDINGS: Brain: No acute territorial infarction, hemorrhage, or intracranial mass. Atrophy with small vessel ischemic changes of the white matter. Prominent ventricles felt secondary to atrophy Vascular: No hyperdense vessels.  Carotid vascular calcification Skull: Normal. Negative for fracture or focal lesion. Sinuses/Orbits: No acute finding. Other: None IMPRESSION: 1. No CT evidence for acute intracranial abnormality. 2. Atrophy with small vessel ischemic changes of the white matter Electronically Signed   By: Donavan Foil M.D.   On: 06/28/2018 17:53   [place] Ekg Site Rite  Result Date: 06/15/2018 If Site Rite image not attached, placement could not be confirmed due to current cardiac rhythm.    ASSESSMENT AND PLAN:   84 year old female with past medical history of hypertension, hyperlipidemia, osteoarthritis, status post recent right hip hemiarthroplasty who  was just discharged to assisted living presents back to the hospital altered mental status, chills and noted to have sepsis secondary to UTI/pneumonia.  1.  Sepsis-patient met criteria given her leukocytosis, fever, CXR findings suggestive of pneumonia.  -Source of sepsis initially thought to be secondary to pneumonia/UTI but those have been ruled out.  Patient had ongoing diarrhea and her C. difficile PCR is positive. - Patient has been taken off IV cefepime and Zithromax for now.  Started on oral vancomycin along with IV Flagyl for her C. difficile. - Has a significant leukocytosis, but is afebrile and hemodynamically stable.  2.  C. difficile diarrhea-this is a source of patient's sepsis. -Continue oral vancomycin, IV Flagyl.  Will get gastroenterology and also surgical consult.  They have been notified. -KUB obtained today showing no evidence of megacolon.  3.  Pneumonia-patient acutely has no respiratory symptoms.  This was incidentally noted on the chest x-ray and admission. -Ruled out, off antibiotics as mentioned above.  4. Acute Renal Failure - due to dehydration from ongoing diarrhea from C. Diff.  - started on IV fluids and will cont. To monitor. Renal dose meds and avoid nephrotoxins.   5.  Leukocytosis-secondary to the underlying sepsis/ C. Diff diarrhea.  - We will follow with IV antibiotic therapy.  6. Hx of Trigeminal Neuralgia - cont. Carbamazepine.   7.  Status post recent right hip hemiarthroplasty- seen by  orthopedics, incision looks fine no evidence of infection. - Continue PT as tolerated.    Discussed plan of care extensively with patient's family at bedside.   All the records are reviewed and case discussed with Care Management/Social Worker. Management plans discussed with the patient, family and they are in agreement.  CODE STATUS: Full code  DVT Prophylaxis: Hep SQ  TOTAL TIME TAKING CARE OF THIS PATIENT: 40 minutes.   POSSIBLE D/C IN 3-4 DAYS,  DEPENDING ON CLINICAL CONDITION.   Henreitta Leber M.D on 06/15/2018 at 3:40 PM  Between 7am to 6pm - Pager - [phone]  After 6pm go to [email or web address] - Proofreader  [facility]  Office  [phone]  CC: Primary care physician; Einar Pheasant, MD

## 2018-06-15 NOTE — Progress Notes (Signed)
Patient lost IV access. Attempted x 2 gain access. Patient has very poor veins. MD notified.

## 2018-06-15 NOTE — Progress Notes (Signed)
Family concerned about patient being on aspirin 325 mg bid and heparin after discussion with Dr. Marius Ditch with GI. I paged Dr. Marius Ditch who stated she thought the dose of Aspirin was too high and should only be daily. She stated she would change this but to notify attending. Will communicate to oncoming nurse to have nurse tomorrow notify attending to clarify Aspirin dose. Will continue with heparin as ordered.

## 2018-06-15 NOTE — Progress Notes (Signed)
Physical Therapy Treatment Patient Details Name: Denise Barry MRN: [id number] DOB: 1933/12/12 Today's Date: 06/15/2018    History of Present Illness 84 y.o. female who was here 06/03/18, s/p R hip hemiarthroplasty s/p fall and hip fx,  now returns from [place] with sepsis, weakness, UTI.  History of diverticulitis, hypercholesterolemia, hypertension, osteoarthritis, varicose vein    PT Comments    Pt struggling t/o the session, feeling nauseous and looking very weak.  She initially refused any PT, but with some encouragement she agreed to do some light supine exercises and she did show good effort with these despite some limitations and needing occasional rest breaks.  Overall pt is very weak and limited and will clearly need continued rehab once medically cleared and able to discharge from hospital.  Will continue seeing her and progressing as she is able.  Follow Up Recommendations  SNF     Equipment Recommendations  None recommended by PT    Recommendations for Other Services       Precautions / Restrictions Precautions Precautions: Fall Restrictions Weight Bearing Restrictions: No RLE Weight Bearing: Weight bearing as tolerated LLE Weight Bearing: Weight bearing as tolerated    Mobility  Bed Mobility               General bed mobility comments: pt refused any mobility siting being very weak and feeling terrible as well as nauseous  Transfers                    Ambulation/Gait                 Stairs             Wheelchair Mobility    Modified Rankin (Stroke Patients Only)       Balance                                            Cognition Arousal/Alertness: Lethargic Behavior During Therapy: (very weak and looking poorly) Overall Cognitive Status: Within Functional Limits for tasks assessed                                        Exercises General Exercises - Lower Extremity Ankle  Circles/Pumps: Strengthening;15 reps Quad Sets: Strengthening;10 reps Gluteal Sets: Strengthening;10 reps Short Arc Quad: AROM;20 reps Heel Slides: AAROM;10 reps Hip ABduction/ADduction: AAROM;10 reps    General Comments        Pertinent Vitals/Pain      Home Living                      Prior Function            PT Goals (current goals can now be found in the care plan section) Progress towards PT goals: Not progressing toward goals - comment(Pt very weak, feeling nauseous, unable to try getting up.)    Frequency    7X/week      PT Plan Current plan remains appropriate    Co-evaluation              AM-PAC PT "6 Clicks" Daily Activity  Outcome Measure  Difficulty turning over in bed (including adjusting bedclothes, sheets and blankets)?: Unable Difficulty moving from lying on back to sitting on the side of the bed? :  Unable Difficulty sitting down on and standing up from a chair with arms (e.g., wheelchair, bedside commode, etc,.)?: Unable Help needed moving to and from a bed to chair (including a wheelchair)?: Total Help needed walking in hospital room?: Total Help needed climbing 3-5 steps with a railing? : Total 6 Click Score: 6    End of Session   Activity Tolerance: Patient limited by fatigue Patient left: with bed alarm set;with call bell/phone within reach;with family/visitor present   PT Visit Diagnosis: Other abnormalities of gait and mobility (R26.89);History of falling (Z91.81);Muscle weakness (generalized) (M62.81);Unsteadiness on feet (R26.81)     Time: 7366-8159 PT Time Calculation (min) (ACUTE ONLY): 14 min  Charges:  $Therapeutic Exercise: 8-22 mins                     Kreg Shropshire, DPT 06/15/2018, 11:28 AM

## 2018-06-15 NOTE — Progress Notes (Signed)
MD in room to see patient. He will order PICC line. He still would like another attempt at peripheral to gain access before IV team arrives. SWOT nurse called to attempt.

## 2018-06-15 NOTE — Progress Notes (Signed)
Spent last hour with this patient due to respiratory distress. Patient with respirations in the 40's. O2 saturations questionable. Found patient on 4liters o2. Placed her on a 40% venturi mask initially. Best o2 saturation noted was maybe 92% however that became questionable as well after checking multiple sites on body for good reading. Patient's arms, hands, fingers, toes are ice cold. Not able to even get a reading with nose probe. ICU RN finally able to upon a saturation reading of 100% by placing probe on patients forehead. Patient by then had been placed on NRB due to respiratory efforts and grey skin color.  I attempted x 3 to get abg on this patient. With each attempt I get flash of blood however blood flow stops. Not able to obtain. o2 saturation however has continued to stay at 100% consistently on nrb and patient has indicated that breathing is better. Family remains at bedside. Will continue to monitor.

## 2018-06-15 NOTE — Progress Notes (Addendum)
Called into room by family reporting patient had vomited. Patient had vomited on herself and into a basin. Dark coffee ground emesis observed. Patient denies pain or nausea. Patient noted to have a weak congested cough. Lungs clear but diminished. Bowel sounds hypoactive. Patient also had liquid bowel movement. MD notified of vomiting and need for nausea meds.

## 2018-06-15 NOTE — Progress Notes (Signed)
Family called out again stating that patient had vomited. Again it was noted that patient vomited dark coffee ground emesis. MD aware and on his way to see patient.

## 2018-06-15 NOTE — Consult Note (Signed)
Cephas Darby, MD [address]  [place]  [place], [place] [address]  Main: [phone]  Fax: [phone] Pager: [phone]   Consultation  Referring Provider:     No ref. provider found Primary Care Physician:  Einar Pheasant, MD Primary Gastroenterologist:  Dr. Sherri Sear         Reason for Consultation:     C. difficile colitis  Date of Admission:  06/26/2018 Date of Consultation:  06/15/2018         HPI:   Denise Barry is a 84 y.o. female with no significant past medical history, functionally independent, recently underwent right hip arthroplasty after a fall resulting in closed right hip fracture on 06/04/2018. He was discharged to acute rehabilitation. She was admitted 2 days ago from the rehabilitation secondary to fever, altered mental status, lethargy, nausea and vomiting. Initially, she was thought to have UTI and pneumonia for which she was receiving cefepime and azithromycin. Patient did not respond well and her leukocytosis worsened from 19.1 on admission to 51.8 today. She was also noted to have diarrhea, stool studies came back positive for C. Difficile. Patient is also reporting abdominal discomfort. She was started on oral vancomycin today, abdominal x-ray was ordered and GI is consulted due to significant leukocytosis. Cefepime and azithromycin were discontinued.  Patient's family were bedside, including daughter, son, son-in-law, daughter-in-law, granddaughter during my encounter with the patient. She reported mild abdominal discomfort, nausea and dryness of the mouth. She is asking to have some ice chips. She had 2 episodes of diarrhea today. General surgery was also consulted due to concern for toxic megacolon. Patient has been afebrile in last 24 hours, mildly tachycardic otherwise her blood pressure has been normal   NSAIDs: Aspirin 325 mg twice daily per Dr. Sabra Heck for 4 weeks after his surgery for DVT prophylaxis    Antiplts/Anticoagulants/Anti thrombotics: Aspirin 325 mg twice daily   GI Procedures:  Colonoscopy in 2008 at [facility], report not available   Past Medical History:  Diagnosis Date  . Allergy   . Diverticulitis    11/18  . Hypercholesterolemia   . Hypertension   . Osteoarthritis   . Urinary incontinence    pessary in place  . Varicose veins    H/O    Past Surgical History:  Procedure Laterality Date  . BACK SURGERY  09/16/13  . DILATION AND CURETTAGE OF UTERUS  1971  . HIP ARTHROPLASTY Right 06/04/2018   Procedure: ARTHROPLASTY BIPOLAR HIP (HEMIARTHROPLASTY);  Surgeon: Earnestine Leys, MD;  Location: [facility] ORS;  Service: Orthopedics;  Laterality: Right;  . JOINT REPLACEMENT    . sclerosis  2000  . TUBAL LIGATION  1972    Prior to Admission medications   Medication Sig Start Date End Date Taking? Authorizing Provider  acetaminophen (TYLENOL) 325 MG tablet Take 2 tablets (650 mg total) by mouth every 6 (six) hours as needed for mild pain or fever (PAIN). 06/06/18  Yes Wieting, Richard, MD  amLODipine (NORVASC) 10 MG tablet Take 1 tablet (10 mg total) by mouth daily. 11/24/17  Yes Einar Pheasant, MD  aspirin EC 325 MG EC tablet Take 1 tablet (325 mg total) by mouth 2 (two) times daily. 06/07/18 07/17/18 Yes Wieting, Richard, MD  cefTRIAXone 1 g in sodium chloride 0.9 % 100 mL Inject 1 g into the vein daily. Mixed with lidocaine 06/26/2018 06/20/18 Yes [provider]  docusate sodium (COLACE) 100 MG capsule Take 100 mg by mouth daily.   Yes  [provider]  Eslicarbazepine Acetate 400 MG TABS Take 400 mg by mouth 3 (three) times daily.    Yes [provider]  Fe Fum-FePoly-Vit C-Vit B3 (INTEGRA) 62.5-62.5-40-3 MG CAPS Take one tablet daily Patient taking differently: Take 1 capsule by mouth daily.  02/26/18  Yes Einar Pheasant, MD  HYDROcodone-acetaminophen (NORCO/VICODIN) 5-325 MG tablet Take 1 tablet by mouth every 6 (six) hours as needed for moderate pain (pain  score 4-6). 06/06/18  Yes Wieting, Richard, MD  Multiple Vitamin (MULTI-VITAMINS) TABS Take 1 tablet by mouth daily.    Yes [provider]  traMADol (ULTRAM) 50 MG tablet Take 1 tablet (50 mg total) by mouth every 6 (six) hours as needed for moderate pain. 06/06/18  Yes Wieting, Richard, MD  promethazine (PHENERGAN) 25 MG/ML injection Inject 12.5 mg into the muscle once.    [provider]  triamcinolone cream (KENALOG) 0.1 % Apply 1 application topically 2 (two) times daily. Patient not taking: Reported on 06/14/2018 04/24/18   Einar Pheasant, MD    Current Facility-Administered Medications:  .  0.9 %  sodium chloride infusion, , Intravenous, Continuous, Sainani, Belia Heman, MD, Last Rate: 125 mL/hr at 06/15/18 1149 .  acetaminophen (TYLENOL) tablet 650 mg, 650 mg, Oral, Q6H PRN, Vaughan Basta, MD, 650 mg at 06/14/18 0902 .  [START ON 07/15/2018] aspirin EC tablet 325 mg, 325 mg, Oral, Daily, Jaimin Krupka, Tally Due, MD .  docusate sodium (COLACE) capsule 100 mg, 100 mg, Oral, BID PRN, Vaughan Basta, MD .  Eslicarbazepine Acetate TABS 400 mg, 400 mg, Oral, TID, Vaughan Basta, MD, 400 mg at 06/15/18 0605 .  heparin injection 5,000 Units, 5,000 Units, Subcutaneous, Q8H, Vaughan Basta, MD, 5,000 Units at 06/15/18 [phone] .  HYDROcodone-acetaminophen (NORCO/VICODIN) 5-325 MG per tablet 1 tablet, 1 tablet, Oral, Q6H PRN, Vaughan Basta, MD .  metroNIDAZOLE (FLAGYL) IVPB 500 mg, 500 mg, Intravenous, Q8H, Sainani, Belia Heman, MD, Stopped at 06/15/18 1249 .  multivitamin with minerals tablet 1 tablet, 1 tablet, Oral, Daily, Vaughan Basta, MD, 1 tablet at 06/14/18 0816 .  ondansetron (ZOFRAN) injection 4 mg, 4 mg, Intravenous, Q6H PRN, Henreitta Leber, MD, 4 mg at 06/15/18 1155 .  pantoprazole (PROTONIX) EC tablet 40 mg, 40 mg, Oral, BID, Camyla Camposano, Tally Due, MD .  promethazine (PHENERGAN) injection 12.5 mg, 12.5 mg, Intramuscular, Once, Vaughan Basta, MD .  vancomycin (VANCOCIN) 50 mg/mL oral solution 125 mg, 125 mg, Oral, Q6H, Sainani, Belia Heman, MD, 125 mg at 06/15/18 1729  Family History  Problem Relation Age of Onset  . Heart disease Mother   . Diabetes Mother   . Hypertension Mother   . Heart disease Father   . Stroke Father   . Cancer Son        bladder  . COPD Son   . Breast cancer Neg Hx      Social History   Tobacco Use  . Smoking status: Former Research scientist (life sciences)  . Smokeless tobacco: Never Used  Substance Use Topics  . Alcohol use: No    Alcohol/week: 0.0 standard drinks  . Drug use: No    Allergies as of 06/18/2018 - Review Complete 06/10/2018  Allergen Reaction Noted  . Codeine Nausea Only 01/06/2014  . Cyclobenzaprine  05/07/2015  . Methocarbamol  05/07/2015  . Sulfa antibiotics Nausea And Vomiting 06/03/2018  . Carbamazepine Anxiety 05/07/2015  . Gabapentin Anxiety 05/07/2015    Review of Systems:    All systems reviewed and negative except where noted in HPI.  Physical Exam:  Vital signs in last 24 hours: Temp:  [98.4 F (36.9 C)-98.7 F (37.1 C)] 98.7 F (37.1 C) (08/16 0805) Pulse Rate:  [85-98] 98 (08/16 1124) Resp:  [17-18] 18 (08/16 0805) BP: (118-141)/(54-73) 141/73 (08/16 1124) SpO2:  [97 %-100 %] 97 % (08/16 1124) FiO2 (%):  [2 %] 2 % (08/16 0104) Last BM Date: 06/14/18 General:  Ill-appearing, in mild distress due to pain Head:  Normocephalic and atraumatic. Eyes:   No icterus.   Conjunctiva pink. PERRLA. Ears:  Normal auditory acuity. Neck:  Supple; no masses or thyroidomegaly Lungs: Respirations even and unlabored. Lungs clear to auscultation bilaterally.   No wheezes, crackles, or rhonchi.  Heart:  Regular rate and rhythm;  Without murmur, clicks, rubs or gallops Abdomen:  Soft, nondistended, mil upper abdominal tenderness, hypoactive bowel sounds. No appreciable masses or hepatomegaly.  No rebound or guarding.  Rectal:  Not performed. Msk:  Symmetrical without gross  deformities. Extremities:  Without edema, cyanosis or clubbing. Neurologic:  Alert and oriented x3;  grossly normal neurologically. Skin:  Intact without significant lesions or rashes. Psych:  Alert and cooperative. Normal affect.  LAB RESULTS: CBC Latest Ref Rng & Units 06/15/2018 06/14/2018 06/07/2018  WBC 3.6 - 11.0 K/uL 51.8(HH) 42.4(H) 35.2(H)  Hemoglobin 12.0 - 16.0 g/dL 11.0(L) 8.8(L) 8.1(L)  Hematocrit 35.0 - 47.0 % 33.8(L) 26.6(L) 24.5(L)  Platelets 150 - 440 K/uL 606(H) 485(H) 397    BMET BMP Latest Ref Rng & Units 06/15/2018 06/14/2018 05/31/2018  Glucose 70 - 99 mg/dL 172(H) 122(H) 121(H)  BUN 8 - 23 mg/dL 40(H) 30(H) 30(H)  Creatinine 0.44 - 1.00 mg/dL 2.08(H) 1.02(H) 1.34(H)  BUN/Creat Ratio 12 - 28 - - -  Sodium 135 - 145 mmol/L 130(L) 134(L) 131(L)  Potassium 3.5 - 5.1 mmol/L 4.5 4.0 3.4(L)  Chloride 98 - 111 mmol/L 104 105 100  CO2 22 - 32 mmol/L 18(L) 21(L) 23  Calcium 8.9 - 10.3 mg/dL 7.7(L) 7.3(L) 7.1(L)    LFT Hepatic Function Latest Ref Rng & Units 06/10/2018 04/24/2018 11/22/2017  Total Protein 6.5 - 8.1 g/dL 5.1(L) 6.8 7.3  Albumin 3.5 - 5.0 g/dL 2.4(L) 4.0 4.0  AST 15 - 41 U/L _0 ALT 0 - 44 U/L _1 Alk Phosphatase 38 - 126 U/L 113 78 61  Total Bilirubin 0.3 - 1.2 mg/dL 0.8 0.3 0.4  Bilirubin, Direct 0.0 - 0.3 mg/dL - 0.0 0.1     STUDIES: Dg Abd 1 View  Result Date: 06/15/2018 CLINICAL DATA:  Nausea, diarrhea EXAM: ABDOMEN - 1 VIEW COMPARISON:  CT 01/03/2018 FINDINGS: Few mildly prominent small bowel loops in the left abdomen, nonspecific. No organomegaly or free air. Bilateral hip replacements. No acute bony abnormality. Postoperative changes in the lower lumbar spine. IMPRESSION: Few mildly prominent left abdominal small bowel loops. This may reflect focal ileus, less likely early low grade small bowel obstruction. No free air. Electronically Signed   By: Rolm Baptise M.D.   On: 06/15/2018 11:35   [place] Ekg Site Rite  Result Date: 06/15/2018 If  Site Rite image not attached, placement could not be confirmed due to current cardiac rhythm.     Impression / Plan:   Denise Barry is a 84 y.o. Caucasian female with history of hypertension, hyperlipidemia who was functionally independent, underwent right hip hemiarthroplasty on 06/04/2018 after a fall and sustained a hip fracture, was an acute rehabilitation, admitted to [facility] secondary to severe sepsis from C.  difficile colitis. Patient has leukemoid reaction secondary to severe C. difficile infection.  Severe C. difficile infection: Patient does have partial ileus - Patient does not have evidence of toxic megacolon, abdominal x-ray does not reveal dilated colon other than mildly dilated fluid-filled small bowel loops - General surgery is on board  - Increase oral vancomycin to 500 mg every 6 hours - Add IV metronidazole 500 mg every 8 hours  - Recommend to add vancomycin retention enema 500 mg every 6 hours if patient is not responding to oral vancomycin and IV Flagyl in next 24 hours as patient has partial ileus  - Also, recommend to rule out other sources of infection for severe sepsis given recent orthopedic surgery  - Okay to have ice chips by mouth  - Maintenance IV fluids to prevent acute kidney injury  - Monitor electrolytes closely and replete as needed  - Patient is on heparin for DVT prophylaxis, consider to decrease aspirin 325 mg from twice a day to once daily  - Add Protonix 40 mg twice daily as stress ulcer prophylaxis   Severe C. difficile infection carries high morbidity and mortality. Discussed my recommendations with the patient's family. Low threshold to transfer to ICU if clinical condition does not improve  Thank you for involving me in the care of this patient. Dr. Vicente Males to cover for the weekend    LOS: 2 days   Sherri Sear, MD  06/15/2018, 7:11 PM   Note: This dictation was prepared with Dragon dictation along with smaller  phrase technology. Any transcriptional errors that result from this process are unintentional.

## 2018-06-16 ENCOUNTER — Inpatient Hospital Stay (HOSPITAL_COMMUNITY)
Admit: 2018-06-16 | Discharge: 2018-06-16 | Disposition: A | Discharge status: Home / Self Care | Payer: Medicare Other | Attending: Adult Health | Admitting: Adult Health

## 2018-06-16 ENCOUNTER — Inpatient Hospital Stay: Payer: Medicare Other

## 2018-06-16 DIAGNOSIS — J9690 Respiratory failure, unspecified, unspecified whether with hypoxia or hypercapnia: Secondary | ICD-10-CM

## 2018-06-16 DIAGNOSIS — J9601 Acute respiratory failure with hypoxia: Secondary | ICD-10-CM

## 2018-06-16 DIAGNOSIS — N390 Urinary tract infection, site not specified: Secondary | ICD-10-CM

## 2018-06-16 DIAGNOSIS — A419 Sepsis, unspecified organism: Principal | ICD-10-CM

## 2018-06-16 DIAGNOSIS — A0472 Enterocolitis due to Clostridium difficile, not specified as recurrent: Secondary | ICD-10-CM

## 2018-06-16 DIAGNOSIS — N3090 Cystitis, unspecified without hematuria: Secondary | ICD-10-CM

## 2018-06-16 LAB — PROTIME-INR
INR: 1.58
PROTHROMBIN TIME: 18.7 s — AB (ref 11.4–15.2)

## 2018-06-16 LAB — COMPREHENSIVE METABOLIC PANEL
ALT: 26 U/L (ref 0–44)
ANION GAP: 15 (ref 5–15)
AST: 55 U/L — ABNORMAL HIGH (ref 15–41)
Albumin: 1.3 g/dL — ABNORMAL LOW (ref 3.5–5.0)
Alkaline Phosphatase: 107 U/L (ref 38–126)
BUN: 53 mg/dL — ABNORMAL HIGH (ref 8–23)
CO2: 15 mmol/L — ABNORMAL LOW (ref 22–32)
Calcium: 6.5 mg/dL — ABNORMAL LOW (ref 8.9–10.3)
Chloride: 108 mmol/L (ref 98–111)
Creatinine, Ser: 3.2 mg/dL — ABNORMAL HIGH (ref 0.44–1.00)
GFR, EST AFRICAN AMERICAN: 14 mL/min — AB (ref >60)
GFR, EST NON AFRICAN AMERICAN: 13 mL/min — AB (ref >60)
Glucose, Bld: 196 mg/dL — ABNORMAL HIGH (ref 70–99)
POTASSIUM: 4.5 mmol/L (ref 3.5–5.1)
Sodium: 138 mmol/L (ref 135–145)
TOTAL PROTEIN: 3.4 g/dL — AB (ref 6.5–8.1)
Total Bilirubin: 0.5 mg/dL (ref 0.3–1.2)

## 2018-06-16 LAB — BLOOD GAS, ARTERIAL
ACID-BASE DEFICIT: 15.4 mmol/L — AB (ref 0.0–2.0)
BICARBONATE: 10.5 mmol/L — AB (ref 20.0–28.0)
FIO2: 1
MECHVT: 500 mL
O2 Saturation: 99.9 %
PCO2 ART: 25 mmHg — AB (ref 32.0–48.0)
PEEP/CPAP: 5 cmH2O
PH ART: 7.23 — AB (ref 7.350–7.450)
Patient temperature: 37
RATE: 24 resp/min
pO2, Arterial: 300 mmHg — ABNORMAL HIGH (ref 83.0–108.0)

## 2018-06-16 LAB — PROCALCITONIN: Procalcitonin: 43.08 ng/mL

## 2018-06-16 LAB — CBC
HCT: 33.2 % — ABNORMAL LOW (ref 35.0–47.0)
Hemoglobin: 10.4 g/dL — ABNORMAL LOW (ref 12.0–16.0)
MCH: 26.9 pg (ref 26.0–34.0)
MCHC: 31.2 g/dL — AB (ref 32.0–36.0)
MCV: 86.3 fL (ref 80.0–100.0)
PLATELETS: 268 10*3/uL (ref 150–440)
RBC: 3.85 MIL/uL (ref 3.80–5.20)
RDW: 18.4 % — AB (ref 11.5–14.5)
WBC: 92.9 10*3/uL (ref 3.6–11.0)

## 2018-06-16 LAB — MAGNESIUM: MAGNESIUM: 2.2 mg/dL (ref 1.7–2.4)

## 2018-06-16 LAB — PHOSPHORUS: PHOSPHORUS: 7.5 mg/dL — AB (ref 2.5–4.6)

## 2018-06-16 LAB — LACTIC ACID, PLASMA
LACTIC ACID, VENOUS: 6.1 mmol/L — AB (ref 0.5–1.9)
Lactic Acid, Venous: 8.4 mmol/L (ref 0.5–1.9)
Lactic Acid, Venous: 11.2 mmol/L (ref 0.5–1.9)

## 2018-06-16 LAB — MRSA PCR SCREENING: MRSA by PCR: NEGATIVE

## 2018-06-16 LAB — GLUCOSE, CAPILLARY
Glucose-Capillary: 211 mg/dL — ABNORMAL HIGH (ref 70–99)
Glucose-Capillary: 194 mg/dL — ABNORMAL HIGH (ref 70–99)
Glucose-Capillary: 127 mg/dL — ABNORMAL HIGH (ref 70–99)

## 2018-06-16 LAB — TROPONIN I: TROPONIN I: 0.09 ng/mL — AB (ref <0.03)

## 2018-06-16 MED ORDER — MIDAZOLAM HCL 2 MG/2ML IJ SOLN
INTRAMUSCULAR | Status: AC
  Filled 2018-06-16: qty 2

## 2018-06-16 MED ORDER — PIPERACILLIN-TAZOBACTAM 3.375 G IVPB: 3.3750 g | Freq: Three times a day (TID) | INTRAVENOUS | Status: DC

## 2018-06-16 MED ORDER — CHLORHEXIDINE GLUCONATE 0.12% ORAL RINSE (MEDLINE KIT): 15.0000 mL | Freq: Two times a day (BID) | OROMUCOSAL | Status: DC

## 2018-06-16 MED ORDER — MIDAZOLAM HCL 2 MG/2ML IJ SOLN
6.0000 mg | Freq: Once | INTRAMUSCULAR | Status: AC
  Administered 2018-06-16: 6 mg via INTRAVENOUS

## 2018-06-16 MED ORDER — FENTANYL 2500MCG IN NS 250ML (10MCG/ML) PREMIX INFUSION
25.0000 ug/h | INTRAVENOUS | Status: DC
  Administered 2018-06-16: 150 ug/h via INTRAVENOUS
  Filled 2018-06-16: qty 250

## 2018-06-16 MED ORDER — FENTANYL BOLUS VIA INFUSION
25.0000 ug | INTRAVENOUS | Status: DC | PRN
  Filled 2018-06-16: qty 50

## 2018-06-16 MED ORDER — NOREPINEPHRINE 16 MG/250ML-% IV SOLN
0.0000 ug/min | INTRAVENOUS | Status: DC
  Administered 2018-06-16: 22 ug/min via INTRAVENOUS
  Filled 2018-06-16: qty 250

## 2018-06-16 MED ORDER — FENTANYL CITRATE (PF) 100 MCG/2ML IJ SOLN
300.0000 ug | Freq: Once | INTRAMUSCULAR | Status: AC
  Administered 2018-06-16: 300 ug via INTRAVENOUS

## 2018-06-16 MED ORDER — MIDAZOLAM HCL 2 MG/2ML IJ SOLN: 1.0000 mg | INTRAMUSCULAR | Status: DC | PRN

## 2018-06-16 MED ORDER — FENTANYL CITRATE (PF) 100 MCG/2ML IJ SOLN: 50.0000 ug | Freq: Once | INTRAMUSCULAR | Status: AC

## 2018-06-16 MED ORDER — FAMOTIDINE IN NACL 20-0.9 MG/50ML-% IV SOLN: 20.0000 mg | Freq: Two times a day (BID) | INTRAVENOUS | Status: DC

## 2018-06-16 MED ORDER — SODIUM BICARBONATE 8.4 % IV SOLN
150.0000 meq | Freq: Once | INTRAVENOUS | Status: AC
  Administered 2018-06-16: 150 meq via INTRAVENOUS

## 2018-06-16 MED ORDER — SODIUM CHLORIDE 0.9 % IV BOLUS
1000.0000 mL | INTRAVENOUS | Status: AC
  Administered 2018-06-16 (×2): 1000 mL via INTRAVENOUS

## 2018-06-16 MED ORDER — VASOPRESSIN 20 UNIT/ML IV SOLN
0.0300 [IU]/min | INTRAVENOUS | Status: DC
  Filled 2018-06-16: qty 2

## 2018-06-16 MED ORDER — MIDAZOLAM HCL 2 MG/2ML IJ SOLN
INTRAMUSCULAR | Status: AC
  Filled 2018-06-16: qty 4

## 2018-06-16 MED ORDER — MIDAZOLAM HCL 2 MG/2ML IJ SOLN
1.0000 mg | INTRAMUSCULAR | Status: DC | PRN
  Filled 2018-06-16: qty 2

## 2018-06-16 MED ORDER — ORAL CARE MOUTH RINSE
15.0000 mL | OROMUCOSAL | Status: DC
  Administered 2018-06-16 (×2): 15 mL via OROMUCOSAL

## 2018-06-16 MED ORDER — IPRATROPIUM-ALBUTEROL 0.5-2.5 (3) MG/3ML IN SOLN: 3.0000 mL | Freq: Four times a day (QID) | RESPIRATORY_TRACT | Status: DC

## 2018-06-16 MED ORDER — IPRATROPIUM-ALBUTEROL 0.5-2.5 (3) MG/3ML IN SOLN
RESPIRATORY_TRACT | Status: AC
  Administered 2018-06-16: 3 mL
  Filled 2018-06-16: qty 3

## 2018-06-16 MED ORDER — PANTOPRAZOLE SODIUM 40 MG IV SOLR: 40.0000 mg | Freq: Every day | INTRAVENOUS | Status: DC

## 2018-06-16 MED ORDER — VANCOMYCIN HCL IN DEXTROSE 1-5 GM/200ML-% IV SOLN
1000.0000 mg | INTRAVENOUS | Status: DC
  Filled 2018-06-16: qty 200

## 2018-06-16 MED ORDER — NOREPINEPHRINE 4 MG/250ML-% IV SOLN
0.0000 ug/min | INTRAVENOUS | Status: DC
  Administered 2018-06-16: 15 ug/min via INTRAVENOUS

## 2018-06-16 MED ORDER — NOREPINEPHRINE BITARTRATE 1 MG/ML IV SOLN
0.0000 ug/min | INTRAVENOUS | Status: DC
  Filled 2018-06-16: qty 4

## 2018-06-16 MED ORDER — DEXTROSE 5 % IV SOLN
INTRAVENOUS | Status: DC
  Administered 2018-06-16: 03:00:00 via INTRAVENOUS
  Filled 2018-06-16 (×2): qty 150

## 2018-06-16 MED ORDER — HYDROCODONE-ACETAMINOPHEN 5-325 MG PO TABS: 1.0000 | ORAL_TABLET | Freq: Four times a day (QID) | ORAL | Status: DC | PRN

## 2018-06-16 MED ORDER — NOREPINEPHRINE 16 MG/250ML-% IV SOLN
0.0000 ug/min | INTRAVENOUS | Status: DC
  Filled 2018-06-16: qty 250

## 2018-06-16 MED ORDER — FENTANYL CITRATE (PF) 100 MCG/2ML IJ SOLN
INTRAMUSCULAR | Status: AC
  Filled 2018-06-16: qty 4

## 2018-06-16 MED ORDER — VANCOMYCIN HCL 500 MG IV SOLR: 500.0000 mg | Freq: Four times a day (QID) | Status: DC

## 2018-06-16 MED ORDER — FENTANYL CITRATE (PF) 100 MCG/2ML IJ SOLN
INTRAMUSCULAR | Status: AC
  Filled 2018-06-16: qty 2

## 2018-06-16 MED FILL — Medication: Qty: 1 | Status: AC

## 2018-06-17 LAB — ECHOCARDIOGRAM COMPLETE
Height: 66 in
Weight: 2480.02 oz

## 2018-06-18 ENCOUNTER — Telehealth: Payer: Self-pay

## 2018-06-18 LAB — CULTURE, BLOOD (ROUTINE X 2)
CULTURE: NO GROWTH
CULTURE: NO GROWTH

## 2018-06-18 LAB — CULTURE, RESPIRATORY

## 2018-06-18 NOTE — Telephone Encounter (Signed)
FYI patient passed away on June 17, 2018 at the hospital

## 2018-06-18 NOTE — Telephone Encounter (Signed)
Copied from [place] [id number]. Topic: General - Other >> Jun 18, 2018  1:24 PM Yvette Rack wrote: Reason for CRM: Daughter in law connie Poblano calling to let pt provider know that pt passed on June 21, 2018

## 2018-06-18 NOTE — Telephone Encounter (Signed)
Noted.  Please call them and let them I am not in the office, but let us know if need anything.

## 2018-06-19 ENCOUNTER — Telehealth: Payer: Self-pay

## 2018-06-19 LAB — BLOOD GAS, ARTERIAL
FIO2: 1
O2 Saturation: 99.8 %
PATIENT TEMPERATURE: 37
PH ART: 6.97 — AB (ref 7.350–7.450)
pO2, Arterial: 178 mmHg — ABNORMAL HIGH (ref 83.0–108.0)

## 2018-06-19 NOTE — Telephone Encounter (Signed)
Callled patients daughter in law and let her know if she needed anything to let us know

## 2018-06-19 NOTE — Telephone Encounter (Signed)
The is not a death summary on this patient.

## 2018-06-19 NOTE — Telephone Encounter (Signed)
Recieved Death Certificate from _______[place] ___ Delivered/Placed _______in nurse box_____

## 2018-06-20 NOTE — Telephone Encounter (Signed)
Informed funeral home death cert is ready for pick up.

## 2018-07-01 NOTE — Progress Notes (Signed)
CRITICAL VALUE ALERT  Critical Value:  Lactic Acid 6.1  Date & Time Notied:  06/30/18 12:20  Provider Notified: Dr. Bradley Ferris  Orders Received/Actions taken: No new orders given at this time.

## 2018-07-01 NOTE — Progress Notes (Signed)
Abg was obtained 0115 and reported to NP Arvil Chaco in icu at 0125 07-08-18 ph 6.97 pco2<19 po2 178 so2 99 hco3 incal. Patient was on floor in room 156. On 100% nrb results would not transmit over to epic. Dr Duane Boston was also notified of results as well in icu

## 2018-07-01 NOTE — Significant Event (Signed)
Rapid Response Event Note  Overview: Time Called: 2212 Arrival Time: 2215 Event Type: Respiratory  Initial Focused Assessment: Pt. Labored breathing w/ a RR of 40 bpm on [place], having trouble getting an SpO2 reading.  Pt. Cool to touch, A&O x 4, other VSS, saying- "I feel awful".  Care RN [facility] bedside with [facility], RT.  Pt. Breath sounds diminished, pulses palpable and moderate.  Informed by bedside RN that pt. Admitted with sepsis but took some time to get treatment started due to vascular issues and access. Looked over lab work and medications.  Interventions: Dr. Duane Boston was paged, discussed pt.'s big increases in WBC- recommended a new draw on lactic acid to check levels.  Discussed respiratory status, able to get SpO2 reading after trying multiple probes- pt. Had dropped to 83% with venti- transitioned to non-rebreather and now SpO2: 100%.  Discussed RR and other VSS.  Cpap ordered for QHS if needed.  ABG recommended and ordered. Warm blankets placed on pt.  Plan of Care (if not transferred): Dr. Duane Boston instructed to wait on ABG and lactic results, since vitals were stable with non-rebreather to monitor pt. In room and give interventions time to work.  Instructed Care RN to call if any vital becomes unstable or mentation changes. This RN will follow lab results, and call back to check in on pt.  Event Summary: Name of Physician Notified: Dr. Duane Boston at 2240    at    Outcome: Stayed in room and stabalized  Event End Time: [place]

## 2018-07-01 NOTE — Consult Note (Addendum)
PULMONARY / CRITICAL CARE MEDICINE   Name: Denise Barry MRN: [id number] DOB: 01/12/1934    ADMISSION DATE:  06/26/2018   CONSULTATION DATE:  2018-06-22  REFERRING MD:  Dr Duane Boston  REASON: Acute respiratory distress and septic shock  HISTORY OF PRESENT ILLNESS:   This is an 84 year old Caucasian female with a history of diverticulitis, hypertension, right hip fracture with repair 1 week ago, who presented to the ED with a fever.  She was found to have a urinary tract infection and C. difficile colitis and hence admitted to the floor for further management.  This morning, a rapid response was called for acute respiratory distress.  Patient was found to be severely hypoxic and hence transferred to the ICU for further management.  Upon arrival in the ICU, patient was in full-blown respiratory failure hence was emergently intubated.  ABG prior to intubation showed a pH of 6.97, PCO2 less than 19, PO2 of 178, SPO2 of 99%, antibiotic That level that was incalculable.   During intubation, patient was found to have copious amounts of coffee-ground gastric secretions in her airway with overt aspiration  PAST MEDICAL HISTORY :  She  has a past medical history of Allergy, Diverticulitis, Hypercholesterolemia, Hypertension, Osteoarthritis, Urinary incontinence, and Varicose veins.  PAST SURGICAL HISTORY: She  has a past surgical history that includes Back surgery (09/16/13); sclerosis (2000); Dilation and curettage of uterus (1971); Tubal ligation (1972); Joint replacement; and Hip Arthroplasty (Right, 06/04/2018).  Allergies  Allergen Reactions  . Codeine Nausea Only    GI upset  . Cyclobenzaprine     Other reaction(s): Headache  . Methocarbamol     Other reaction(s): Other (See Comments)  . Sulfa Antibiotics Nausea And Vomiting  . Carbamazepine Anxiety  . Gabapentin Anxiety    No current facility-administered medications on file prior to encounter.    Current Outpatient Medications on  File Prior to Encounter  Medication Sig  . acetaminophen (TYLENOL) 325 MG tablet Take 2 tablets (650 mg total) by mouth every 6 (six) hours as needed for mild pain or fever (PAIN).  Marland Kitchen amLODipine (NORVASC) 10 MG tablet Take 1 tablet (10 mg total) by mouth daily.  Marland Kitchen aspirin EC 325 MG EC tablet Take 1 tablet (325 mg total) by mouth 2 (two) times daily.  . cefTRIAXone 1 g in sodium chloride 0.9 % 100 mL Inject 1 g into the vein daily. Mixed with lidocaine  . docusate sodium (COLACE) 100 MG capsule Take 100 mg by mouth daily.  . Eslicarbazepine Acetate 400 MG TABS Take 400 mg by mouth 3 (three) times daily.   . Fe Fum-FePoly-Vit C-Vit B3 (INTEGRA) 62.5-62.5-40-3 MG CAPS Take one tablet daily (Patient taking differently: Take 1 capsule by mouth daily. )  . HYDROcodone-acetaminophen (NORCO/VICODIN) 5-325 MG tablet Take 1 tablet by mouth every 6 (six) hours as needed for moderate pain (pain score 4-6).  . Multiple Vitamin (MULTI-VITAMINS) TABS Take 1 tablet by mouth daily.   . traMADol (ULTRAM) 50 MG tablet Take 1 tablet (50 mg total) by mouth every 6 (six) hours as needed for moderate pain.  . promethazine (PHENERGAN) 25 MG/ML injection Inject 12.5 mg into the muscle once.  . triamcinolone cream (KENALOG) 0.1 % Apply 1 application topically 2 (two) times daily. (Patient not taking: Reported on 06/21/2018)    FAMILY HISTORY:  Her family history includes COPD in her son; Cancer in her son; Diabetes in her mother; Heart disease in her father and mother; Hypertension in her mother; Stroke in  her father. There is no history of Breast cancer.  SOCIAL HISTORY: She  reports that she has quit smoking. She has never used smokeless tobacco. She reports that she does not drink alcohol or use drugs.  REVIEW OF SYSTEMS:   Unable to obtain as patient is intubated and sedated  SUBJECTIVE:   VITAL SIGNS: BP (!) 83/60   Pulse (!) 112   Temp 97.6 F (36.4 C) (Oral)   Resp (!) 28   Ht 5\' 6"  (1.676 m)   Wt 70.3  kg   SpO2 97%   BMI 25.02 kg/m   HEMODYNAMICS:    VENTILATOR SETTINGS: FiO2 (%):  [40 %-100 %] 100 % Set Rate:  [24 bmp] 24 bmp Vt Set:  [500 mL] 500 mL PEEP:  [5 cmH20] 5 cmH20  INTAKE / OUTPUT: I/O last 3 completed shifts: In: 2026.3 [P.O.:480; I.V.:1446.3; IV Piggyback:100] Out: -   PHYSICAL EXAMINATION: General: Pale, toxic looking Neuro: Awake, unable to follow commands, combative, moves all extremities HEENT: PERRLA, no JVD, trachea midline Cardiovascular: Apical pulse tachycardic, S1-S2, no murmur regurg or gallop, +2 pulses, no edema Lungs: Bilateral breath sounds, diminished in the bases, scattered rhonchi in anterior lung fields Abdomen: Nondistended, hypoactive bowel sounds, palpation reveals no organomegaly Musculoskeletal: Limited range of motion in right hip status post fracture, upper and left lower extremities unremarkable, no joint deformities Skin: Pale and cyanotic  LABS:  BMET Recent Labs  Lab 06/14/18 0339 06/15/18 0448 06/17/18 0248  NA 134* 130* 138  K 4.0 4.5 4.5  CL 105 104 108  CO2 21* 18* 15*  BUN 30* 40* 53*  CREATININE 1.02* 2.08* 3.20*  GLUCOSE 122* 172* 196*    Electrolytes Recent Labs  Lab 06/14/18 0339 06/15/18 0448 06-17-18 0248  CALCIUM 7.3* 7.7* 6.5*  MG  --   --  2.2  PHOS  --   --  7.5*    CBC Recent Labs  Lab 06/14/18 0339 06/15/18 0448 2018-06-17 0248  WBC 42.4* 51.8* 92.9*  HGB 8.8* 11.0* 10.4*  HCT 26.6* 33.8* 33.2*  PLT 485* 606* 268    Coag's Recent Labs  Lab 06/28/2018 1702 06/17/2018 0248  INR 1.40 1.58    Sepsis Markers Recent Labs  Lab 06/12/2018 1902 06/15/18 2343 June 17, 2018 0248  LATICACIDVEN 1.2 6.1* 8.4*    ABG Recent Labs  Lab 2018/06/17 0201  PHART 7.23*  PCO2ART 25*  PO2ART 300*    Liver Enzymes Recent Labs  Lab 06/30/2018 1754 06/17/18 0248  AST 21 55*  ALT 26 26  ALKPHOS 113 107  BILITOT 0.8 0.5  ALBUMIN 2.4* 1.3*    Cardiac Enzymes Recent Labs  Lab 2018-06-17 0248   TROPONINI 0.09*    Glucose Recent Labs  Lab 06-17-2018 0156  GLUCAP 211*    Imaging Dg Abd 1 View  Result Date: June 17, 2018 CLINICAL DATA:  OG tube placement. Vomiting and nausea. EXAM: ABDOMEN - 1 VIEW COMPARISON:  06/15/2018 FINDINGS: Enteric tube is not visible within the field of view. Mildly prominent gas-filled mid abdominal small bowel. This could represent localized ileus or enteritis. Early or partial obstruction less likely. Paucity of gas in the colon. Postoperative changes in the lower lumbar spine and hips. Degenerative changes in the spine. IMPRESSION: Enteric tube is not visualized within the field of view. Mildly prominent gas-filled mid abdominal small bowel may represent localized ileus or enteritis. Electronically Signed   By: Lucienne Capers M.D.   On: 2018-06-17 03:13   Dg Abd 1 View  Result Date: 06/15/2018 CLINICAL DATA:  Nausea, diarrhea EXAM: ABDOMEN - 1 VIEW COMPARISON:  CT 01/03/2018 FINDINGS: Few mildly prominent small bowel loops in the left abdomen, nonspecific. No organomegaly or free air. Bilateral hip replacements. No acute bony abnormality. Postoperative changes in the lower lumbar spine. IMPRESSION: Few mildly prominent left abdominal small bowel loops. This may reflect focal ileus, less likely early low grade small bowel obstruction. No free air. Electronically Signed   By: Rolm Baptise M.D.   On: 06/15/2018 11:35   Dg Chest Port 1 View  Result Date: 2018/07/06 CLINICAL DATA:  Intubation and central line placement EXAM: PORTABLE CHEST 1 VIEW COMPARISON:  06/09/2018 FINDINGS: Endotracheal tube placed with tip measuring 5.5 cm above the carina. Enteric tube tip projects over the mid/distal esophagus. Advancement is suggested if placement in the stomach is desired. Right central venous catheter with tip projecting over the cavoatrial junction region. No pneumothorax. Shallow inspiration with atelectasis or consolidation in the left lung base, improving since  previous study. Right lung is clear. Calcification of the aorta. IMPRESSION: Endotracheal tube tip measures 5.5 cm above the carina. Enteric tube tip projects over the mid/distal esophagus. Advancement is suggested. Improved atelectasis or consolidation in the left lung base since previous study. Electronically Signed   By: Lucienne Capers M.D.   On: 07/06/18 03:12   [place] Ekg Site Rite  Result Date: 06/15/2018 If Site Rite image not attached, placement could not be confirmed due to current cardiac rhythm.    STUDIES:  2D echo pending  CULTURES: Cultures x2 Urine culture  ANTIBIOTICS: Oral vancomycin IV vancomycin IV Flagyl IV Zosyn  SIGNIFICANT EVENTS: 06/23/2018: Admitted with C. difficile colitis and UTI 07/06/2018: Transferred to the ICU for septic shock and acute respiratory failure  LINES/TUBES: ET tube placed 2018-07-06 Right central venous catheter placed 2018/07/06 Foley catheter 2018-07-06  DISCUSSION: 84 year old female presenting with acute hypoxic respiratory failure secondary to severe septic shock from C. difficile colitis and UTI  ASSESSMENT / PLAN:  PULMONARY A: Acute hypoxic respiratory failure Severe metabolic acidosis Aspiration into airway Severe lactic acidosis- lactic acid trending up P:   Full vent support with current settings ABG and chest x-ray post intubation reviewed ABG and chest x-ray PRN Bicarb infusion with bicarb pushes as ordered ABG at 8 AM to reevaluate acidosis Initiate SBT's in 24 to 48 hours or as tolerated Zosyn for aspiration pneumonitis IV fluids Trend lactic acid level CARDIOVASCULAR A:  Septic shock P:  IV fluid boluses and pressors to maintain mean arterial blood pressure greater than 65 Hemodynamic monitoring per ICU protocol CVP monitoring every 4 hours  RENAL A:   Acute renal failure Hyperphosphatemia P:   Monitor and correct electrolytes trend Creatinine Bicarb infusion  GASTROINTESTINAL A:    Coffee-ground emesis P:   I doubt that patient has an acute GI bleed; hemoglobin is 10.4 from baseline of 8.8. Trend hemoglobin and hematocrit Continue IV hydration Pepcid 20 mg IV twice a day PRN Maalox as needed  HEMATOLOGIC A:   Severely elevated WBC of 92.9 by: Likely due to overwhelming infection P:  Trend WBC Continue antibiotics DVT prophylaxis with heparin  INFECTIOUS A:   Sepsis secondary to C. difficile colitis and UTI Aspiration pneumonitis P:   Antibiotics as above Follow-up cultures Trend procalcitonin and adjust antibiotics  ENDOCRINE A:   No acute issues P:   Monitor blood sugar every 4 hours  NEUROLOGIC A:   Acute metabolic encephalopathy due to severe infection P:   RASS  goal: 0 to -1 Fentanyl and as needed Versed for vent sedation and discomfort   FAMILY   Updates: Family updated. All questions answered. Plan is keep patient on the vent for 24-48 hrs and then if no improvement, family would like a one way intubation. Per family, patient is a partial code with no CPR but partial trial of intubation  Kane Kusek S. [facility] ANP-BC Pulmonary and Critical Care Medicine [facility] Pager [phone] or [phone]  NB: This document was prepared using Dragon voice recognition software and may include unintentional dictation errors.    16-Jul-2018, 4:13 AM

## 2018-07-01 NOTE — Procedures (Signed)
Central Venous Catheter Insertion Procedure Note Kiffany Schelling [id number] Mar 10, 1934  Procedure: Insertion of Central Venous Catheter Indications: Assessment of intravascular volume, Drug and/or fluid administration and Frequent blood sampling  Procedure Details Consent: Unable to obtain consent because of emergent medical necessity. Time Out: Verified patient identification, verified procedure, site/side was marked, verified correct patient position, special equipment/implants available, medications/allergies/relevent history reviewed, required imaging and test results available.  Performed  Maximum sterile technique was used including antiseptics, cap, gloves, gown, hand hygiene, mask and sheet. Skin prep: Chlorhexidine; local anesthetic administered A antimicrobial bonded/coated triple lumen catheter was placed in the right internal jugular vein using the Seldinger technique.  Evaluation Blood flow good Complications: No apparent complications Patient did tolerate procedure well. Chest X-ray ordered to verify placement.  CXR: normal. Procedure performed under direct supervision of Dr.Conforti. Ultrasound utilized for realtime vessel cannulation   Magdalene S. [facility] ANP-BC Pulmonary and Critical Care Medicine [facility] Pager [phone] or [phone]  NB: This document was prepared using Dragon voice recognition software and may include unintentional dictation errors.     July 01, 2018, 4:11 AM

## 2018-07-01 NOTE — Progress Notes (Signed)
Went in to round on patient. I notice patient seemed to be having more difficultly breathing. I called a rapid and patient was transferred to the unit.

## 2018-07-01 NOTE — Progress Notes (Signed)
Family arrived, talked to RN, all questions answered as asked. Chaplain at bedside with family, [place] home form signed, pt has no valuables of jewelry. CDC contacted and declined donation. All lines and tubes removed and postmortem care completed and then to morgue

## 2018-07-01 NOTE — Progress Notes (Signed)
Chaplain received a page for RR. Chaplain went to patient room and care team was working with patient. Chaplain introduced herself to patient son Rodman Key and granddaughter Ria Comment. Patient was moved to ICU and Chaplain walked to waiting area and stayed there until they were able to see patient. Chaplain gave emotional support, spiritual support and encouragement. Chaplain prayed with family. Chaplain walked out with the family and family thanked Chaplain for her support.

## 2018-07-01 NOTE — Progress Notes (Signed)
Pt rapid response from [place]. Pt being bagged on arrival to unit. Pt intubated by Sobi RT. Pt given meds as documented. Family updated at bedside.

## 2018-07-01 NOTE — Progress Notes (Signed)
Pharmacy Antibiotic Note  Denise Barry is a 84 y.o. female admitted on 06/23/2018 with pneumonia.  Pharmacy has been consulted for zosyn dosing.  Plan: Zosyn 3.375g IV q8h (4 hour infusion).  Height: 5\' 6"  (167.6 cm) Weight: 155 lb (70.3 kg) IBW/kg (Calculated) : 59.3  Temp (24hrs), Avg:97.5 F (36.4 C), Min:96.3 F (35.7 C), Max:98.7 F (37.1 C)  Recent Labs  Lab 06/05/2018 1702 06/19/2018 1754 06/29/2018 1902 06/14/18 0339 06/15/18 0448 06/15/18 2343  WBC 19.1* 35.2*  --  42.4* 51.8*  --   CREATININE  --  1.34*  --  1.02* 2.08*  --   LATICACIDVEN 0.5  --  1.2  --   --  6.1*    Estimated Creatinine Clearance: 19.5 mL/min (A) (by C-G formula based on SCr of 2.08 mg/dL (H)).    Allergies  Allergen Reactions  . Codeine Nausea Only    GI upset  . Cyclobenzaprine     Other reaction(s): Headache  . Methocarbamol     Other reaction(s): Other (See Comments)  . Sulfa Antibiotics Nausea And Vomiting  . Carbamazepine Anxiety  . Gabapentin Anxiety    Thank you for allowing pharmacy to be a part of this patient's care.  Tobie Lords, PharmD, BCPS Clinical Pharmacist Jul 05, 2018

## 2018-07-01 NOTE — Procedures (Addendum)
Intubation Procedure Note Denise Barry [id number] 03-30-1934  Procedure: Intubation Indications: Respiratory insufficiency  Procedure Details Consent: Risks of procedure as well as the alternatives and risks of each were explained to the (patient/caregiver).  Consent for procedure obtained. Time Out: Verified patient identification, verified procedure, site/side was marked, verified correct patient position, special equipment/implants available, medications/allergies/relevent history reviewed, required imaging and test results available.  Performed  Maximum sterile technique was used including antiseptics, cap, gloves, gown, hand hygiene, mask and sheet.  MAC and 4   Evaluation Hemodynamic Status: Transient hypotension treated with pressors and fluid; O2 sats: stable throughout Patient's Current Condition: Aspirated gastric contents Complications: Aspirated into airway Patient did tolerate procedure well. Chest X-ray ordered to verify placement.  CXR: tube position acceptable.  Procedure performed under direct supervision of Dr.Conforti. Glidoscope utilized to visualize airway; but manually intubated due to copious secretions and gastric fluid in airway  Denise Barry and Denise Barry July 04, 2018

## 2018-07-01 NOTE — Significant Event (Signed)
Rapid Response Event Note  Called Denise Barry, care RN to check in on pt., ABG and lactic had not resulted.  Care RN said VSS, lactic had been drawn just waiting on results and that RT had stuck 3 x without success to get an ABG. Denise Barry instructed to call Rapid if anything changed and that this RN would keep looking out for that lactic result.  Domingo Pulse [place]

## 2018-07-01 NOTE — Death Summary Note (Signed)
DEATH SUMMARY   Patient Details  Name: Denise Barry MRN: [id number] DOB: [birth year]  Admission/Discharge Information   Admit Date:  06-19-2018  Date of Death: Date of Death: 06-22-18  Time of Death: Time of Death: 0720  Length of Stay: 3  Referring Physician: Einar Pheasant, MD   Reason(s) for Hospitalization  Confusion and chills with signs of sepsis  Diagnoses  Preliminary cause of death:  Septic shock, multisystem organ failure Secondary Diagnoses (including complications and co-morbidities):  Principal Problem:   Sepsis ([place]) Active Problems:   Acute lower UTI   Respiratory failure ([place])   Cystitis   Clostridium enterocolitis   Brief Hospital Course (including significant findings, care, treatment, and services provided and events leading to death)  Denise Barry is a 84 y.o. year old female with a history of diverticulitis, hypertension, hip fracture, presented with fever, found to have urinary tract infection, C. Difficile colitis, was seen by general surgery and felt not to be a surgical candidate. Progressed overnight to hypotension, respiratory failure with severe acidosis requiring intubation, septic shock, requiring central line placement and pressors for hypotension. Patient had increasing lactic acid. Per surgery patient did not want surgical intervention yesterday. She was a DO NOT RESUSCITATE. This morning her blood pressure dropped and her heart stopped. She was subsequently pronounced by nurse. I spoke with the son and related to him what happened. He and family are on their way in.   Pertinent Labs and Studies  Significant Diagnostic Studies Dg Chest 2 View  Result Date: Jun 19, 2018 CLINICAL DATA:  Possible sepsis EXAM: CHEST - 2 VIEW COMPARISON:  06/03/2018 FINDINGS: Surgical plate and screw fixation of proximal left humerus. Patchy left greater than right bibasilar infiltrates. No significant effusion. Stable cardiomediastinal silhouette with  aortic atherosclerosis. No pneumothorax. IMPRESSION: Patchy left greater than right basilar infiltrates. Electronically Signed   By: Donavan Foil M.D.   On: 06/19/2018 18:03   Dg Abd 1 View  Result Date: 2018-06-22 CLINICAL DATA:  OG tube placement. Vomiting and nausea. EXAM: ABDOMEN - 1 VIEW COMPARISON:  06/15/2018 FINDINGS: Enteric tube is not visible within the field of view. Mildly prominent gas-filled mid abdominal small bowel. This could represent localized ileus or enteritis. Early or partial obstruction less likely. Paucity of gas in the colon. Postoperative changes in the lower lumbar spine and hips. Degenerative changes in the spine. IMPRESSION: Enteric tube is not visualized within the field of view. Mildly prominent gas-filled mid abdominal small bowel may represent localized ileus or enteritis. Electronically Signed   By: Lucienne Capers M.D.   On: 06/22/18 03:13   Dg Abd 1 View  Result Date: 06/15/2018 CLINICAL DATA:  Nausea, diarrhea EXAM: ABDOMEN - 1 VIEW COMPARISON:  CT 01/03/2018 FINDINGS: Few mildly prominent small bowel loops in the left abdomen, nonspecific. No organomegaly or free air. Bilateral hip replacements. No acute bony abnormality. Postoperative changes in the lower lumbar spine. IMPRESSION: Few mildly prominent left abdominal small bowel loops. This may reflect focal ileus, less likely early low grade small bowel obstruction. No free air. Electronically Signed   By: Rolm Baptise M.D.   On: 06/15/2018 11:35   Ct Head Wo Contrast  Result Date: 06/19/18 CLINICAL DATA:  Altered LOC EXAM: CT HEAD WITHOUT CONTRAST TECHNIQUE: Contiguous axial images were obtained from the base of the skull through the vertex without intravenous contrast. COMPARISON:  MRI 08/06/2012 FINDINGS: Brain: No acute territorial infarction, hemorrhage, or intracranial mass. Atrophy with small vessel ischemic changes of the white  matter. Prominent ventricles felt secondary to atrophy Vascular: No  hyperdense vessels.  Carotid vascular calcification Skull: Normal. Negative for fracture or focal lesion. Sinuses/Orbits: No acute finding. Other: None IMPRESSION: 1. No CT evidence for acute intracranial abnormality. 2. Atrophy with small vessel ischemic changes of the white matter Electronically Signed   By: Donavan Foil M.D.   On: 06/28/2018 17:53   Dg Chest Port 1 View  Result Date: 06-22-2018 CLINICAL DATA:  Intubation and central line placement EXAM: PORTABLE CHEST 1 VIEW COMPARISON:  06/03/2018 FINDINGS: Endotracheal tube placed with tip measuring 5.5 cm above the carina. Enteric tube tip projects over the mid/distal esophagus. Advancement is suggested if placement in the stomach is desired. Right central venous catheter with tip projecting over the cavoatrial junction region. No pneumothorax. Shallow inspiration with atelectasis or consolidation in the left lung base, improving since previous study. Right lung is clear. Calcification of the aorta. IMPRESSION: Endotracheal tube tip measures 5.5 cm above the carina. Enteric tube tip projects over the mid/distal esophagus. Advancement is suggested. Improved atelectasis or consolidation in the left lung base since previous study. Electronically Signed   By: Lucienne Capers M.D.   On: 2018-06-22 03:12   Dg Chest Portable 1 View  Result Date: 06/03/2018 CLINICAL DATA:  Hip fracture. Former smoker 20 years ago with hypertension. EXAM: PORTABLE CHEST 1 VIEW COMPARISON:  02/03/2015 FINDINGS: Heart is top-normal in size. There is mild-to-moderate aortic atherosclerosis with slight uncoiling of the thoracic aorta. Probable small hiatal hernia accounting for soft tissue density projecting over the cardiac silhouette. Costochondral calcifications are present bilaterally. No pulmonary consolidations suspicious for pneumonia. No effusion or pneumothorax. No pulmonary edema. Partially included left humerus fixation hardware is noted. IMPRESSION: Aortic  atherosclerosis.  No active pulmonary disease. Electronically Signed   By: Ashley Royalty M.D.   On: 06/03/2018 18:19   Dg Hip Port Unilat With Pelvis 1v Right  Result Date: 06/04/2018 CLINICAL DATA:  Post-op right hip hemiarthroplasty. EXAM: DG HIP (WITH OR WITHOUT PELVIS) 1V PORT RIGHT COMPARISON:  Plain film dated 06/03/2018. FINDINGS: New RIGHT hip arthroplasty hardware appears appropriately positioned. Expected postsurgical changes within the overlying soft tissues. LEFT hip arthroplasty hardware appears stable in position. IMPRESSION: Status post RIGHT hip arthroplasty. No evidence of surgical complicating feature. Electronically Signed   By: Franki Cabot M.D.   On: 06/04/2018 15:15   Dg Hip Unilat W Or Wo Pelvis 2-3 Views Right  Result Date: 06/03/2018 CLINICAL DATA:  Patient fell from a chair yesterday and reported no pain in the right hip patella 2 p.m. today. EXAM: DG HIP (WITH OR WITHOUT PELVIS) 2-3V RIGHT COMPARISON:  None. FINDINGS: There is a varus angulated varus angulated transcervical fracture of the right femur with probable slight impaction of the femoral head upon the base of femoral head. Osteoarthritic joint space narrowing is seen of the native right hip. The bony pelvis appears intact without apparent fracture. Partially included lower lumbar posterior spinal fusion hardware extends to the L5 level bilaterally. There is lower lumbar degenerative disc and facet arthropathy from L4 through S1. No pelvic diastasis. A bipolar left hip arthroplasty also noted with cemented femoral component but without without complicating features. Gluteal enthesopathy is noted off the left greater trochanter. A pessary is in place. IMPRESSION: Acute varus angulated slightly impacted transcervical fracture of the right femur. Electronically Signed   By: Ashley Royalty M.D.   On: 06/03/2018 17:50   [place] Ekg Site Rite  Result Date: 06/15/2018 If [facility]  image not attached, placement could not be confirmed due  to current cardiac rhythm.   Microbiology Recent Results (from the past 240 hour(s))  Culture, blood (Routine x 2)     Status: None   Collection Time: 06/23/2018  4:59 PM  Result Value Ref Range Status   Specimen Description   Final    BLOOD RIGHT ANTECUBITAL Performed at [facility], [address] [place], [place] [address]    Special Requests   Final    BOTTLES DRAWN AEROBIC AND ANAEROBIC Blood Culture results may not be optimal due to an excessive volume of blood received in culture bottles Performed at [facility], [address], [place], [place] [address]    Culture   Final    NO GROWTH 5 DAYS Performed at [facility], [place] [address], [place], [place] [address]    Report Status 06/18/2018 FINAL  Final  Culture, blood (Routine x 2)     Status: None   Collection Time: 06/15/2018  4:59 PM  Result Value Ref Range Status   Specimen Description   Final    BLOOD BLOOD RIGHT FOREARM Performed at [facility], [address], [place], [place] [address]    Special Requests   Final    BOTTLES DRAWN AEROBIC AND ANAEROBIC Blood Culture results may not be optimal due to an excessive volume of blood received in culture bottles Performed at [facility], [address], [place], [place] [address]    Culture   Final    NO GROWTH 5 DAYS Performed at [facility], [place] [address], [place], [place] [address]    Report Status 06/18/2018 FINAL  Final  Urine culture     Status: None   Collection Time: 06/04/2018  5:54 PM  Result Value Ref Range Status   Specimen Description   Final    URINE, RANDOM Performed at [facility], [address] [place], [place] [address]    Special Requests   Final    NONE Performed at [facility], [address], [place], [place] [address]    Culture   Final    NO GROWTH Performed at [facility], [place] [address], [place], [place] [address]    Report Status 06/15/2018 FINAL   Final  C difficile quick scan w PCR reflex     Status: Abnormal   Collection Time: 06/14/18  5:50 AM  Result Value Ref Range Status   C Diff antigen POSITIVE (A) NEGATIVE Final   C Diff toxin POSITIVE (A) NEGATIVE Final   C Diff interpretation Toxin producing C. difficile detected.  Final    Comment: RESULT CALLED TO, READ BACK BY AND VERIFIED WITH: MATT PAGE ON 06/15/18 AT 4431 QSD Performed at [facility], [address] [place], [place] [address]   Culture, respiratory (non-expectorated)     Status: None   Collection Time: 07-02-18  5:29 AM  Result Value Ref Range Status   Specimen Description   Final    TRACHEAL ASPIRATE Performed at [facility], [address], [place], [place] [address]    Special Requests   Final    NONE Performed at [facility], [address] [place], [place] [address]    Gram Stain   Final    MODERATE WBC PRESENT, PREDOMINANTLY PMN MODERATE YEAST WITH PSEUDOHYPHAE Performed at [facility], [place] [address] [place], [place] [address]    Culture MODERATE CANDIDA ALBICANS  Final   Report Status 06/18/2018 FINAL  Final  MRSA PCR Screening     Status: None   Collection Time: 07/04/18  6:19 AM  Result Value Ref Range Status   MRSA by PCR NEGATIVE NEGATIVE Final    Comment:        The GeneXpert MRSA Assay (FDA approved for NASAL specimens only), is one component of a comprehensive MRSA colonization surveillance program. It is not intended to diagnose MRSA infection nor to guide or monitor treatment for MRSA infections. Performed at [facility], [address] [place], [place] [address]     Lab Basic Metabolic Panel: No results for input(s): NA, K, CL, CO2, GLUCOSE, BUN, CREATININE, CALCIUM, MG, PHOS in the last 168 hours. Liver Function Tests: No results for input(s): AST, ALT, ALKPHOS, BILITOT, PROT, ALBUMIN in the last 168 hours. No results for input(s): LIPASE, AMYLASE in the last 168  hours. No results for input(s): AMMONIA in the last 168 hours. CBC: No results for input(s): WBC, NEUTROABS, HGB, HCT, MCV, PLT in the last 168 hours. Cardiac Enzymes: No results for input(s): CKTOTAL, CKMB, CKMBINDEX, TROPONINI in the last 168 hours. Sepsis Labs: No results for input(s): PROCALCITON, WBC, LATICACIDVEN in the last 168 hours.  Procedures/Operations  Intubation / Central Line Placement   Eliott Amparan 06/23/2018, 7:11 AM

## 2018-07-01 NOTE — Progress Notes (Addendum)
I was called to evaluate the patient, who is becoming more lethargic, hypotensive and short of breath.  She has been requiring more oxygen in the past few hours.  Clinically, patient looks acutely ill, lethargic, poorly responding. We checked lactic acid level and ABG.  Lactic acid level is now significantly elevated at 6.1 and ABG shows severe acidosis, with pH 6.9.   A/P: -Septic shock likely secondary to severe C. difficile colitis -Acute respiratory failure with hypoxemia -Metabolic acidosis -Severe C. difficile colitis -Acute renal failure  Plan: We will transfer patient to intensive care unit and prepared for intubation.  We will start bicarb drip and IV pressors.  We will continue IV fluids and antibiotics, Flagyl IV and oral vancomycin through NG tube. I had a long conversation with the family about the management and prognosis, all questions were answered. Discussed with ICU team.

## 2018-07-01 NOTE — Progress Notes (Signed)
Lab called about 7:10 to inform us of increasing lactic acid from 6 to 11. MD informed. RN went in to assess pt and while in room HR 133  to 40  BP dropped as well to systolic of 01S, , Levophed increased from 25 mgs to 30, then 40 , BP remained in 50s , then HR dropped to 20s  and pt is a limited code.MD informed of change of status,but then HR dropped to 0 . Chaplain paged , but no family here at this time, MD contacted son via phone and he will come back to hospital, chaplain awaiting arrival

## 2018-07-01 NOTE — Significant Event (Signed)
Rapid Response Event Note  Overview: Time Called: 0106 Arrival Time: 0110 Event Type: Other (Comment)(sepsis)  Initial Focused Assessment: Pt.'s lactic > 6 per care RN on arrival, pt. On non-rebreather with labored breathing, SpO2 registering intermittently with difficulty (@ one point in 40's), pt.'s eyes open but not verbally responsive. Other vitals stable per what care RN had already obtained. Care RN stated she spoke with Dr. Duane Boston and relayed critical value. Neoma Laming, charge RN, bedside stated she had already paged Dr. Duane Boston back.  Interventions: RT bedside> pt. Bagged, ABG obtained, Dr. Duane Boston paged, NS IVF hung wide open for bolus, obtained oxygen tank and rolled with pt. To ICU for further intervention. Lelon Frohlich, [facility] called bed placement Ms. Patria Mane, NP called in route with ABG results which where critical. This RN called unit so intubation equipment would be setup at arrival. HR and BP remained stable.  Family bedside, escorted by [place] care RN to ICU waiting room with chaplain while pt. Is settled and stabilized.  Event Summary: Name of Physician Notified: Dr. Eual Fines) at 0110(initial page)  Name of Consulting Physician Notified: Ms. Patria Mane, NP(called in route while rolling- see note for details) at 0115  Outcome: Transferred (Comment)(transferred to ICU)  Event End Time: [place]

## 2018-07-01 DEATH — deceased

## 2018-08-20 ENCOUNTER — Ambulatory Visit (INDEPENDENT_AMBULATORY_CARE_PROVIDER_SITE_OTHER): Payer: Medicare Other | Admitting: Vascular Surgery

## 2018-11-06 ENCOUNTER — Ambulatory Visit: Payer: Medicare Other

## 2018-11-27 ENCOUNTER — Ambulatory Visit: Payer: Medicare Other | Admitting: Nurse Practitioner

## 2019-03-15 IMAGING — CR DG LUMBAR SPINE COMPLETE W/ BEND
7 series · 8 of 8 positions shown · non-contrast
Comparison: 03/23/2017

CLINICAL DATA: Mid to lower lumbar back pain without radiculopathy

EXAM:
LUMBAR SPINE - COMPLETE WITH BENDING VIEWS

[l-spine lat]
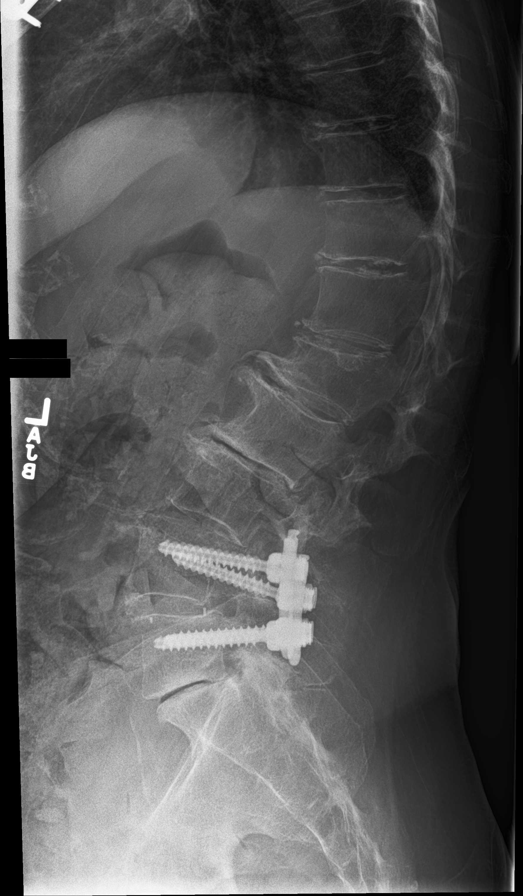

[l-spine flex]
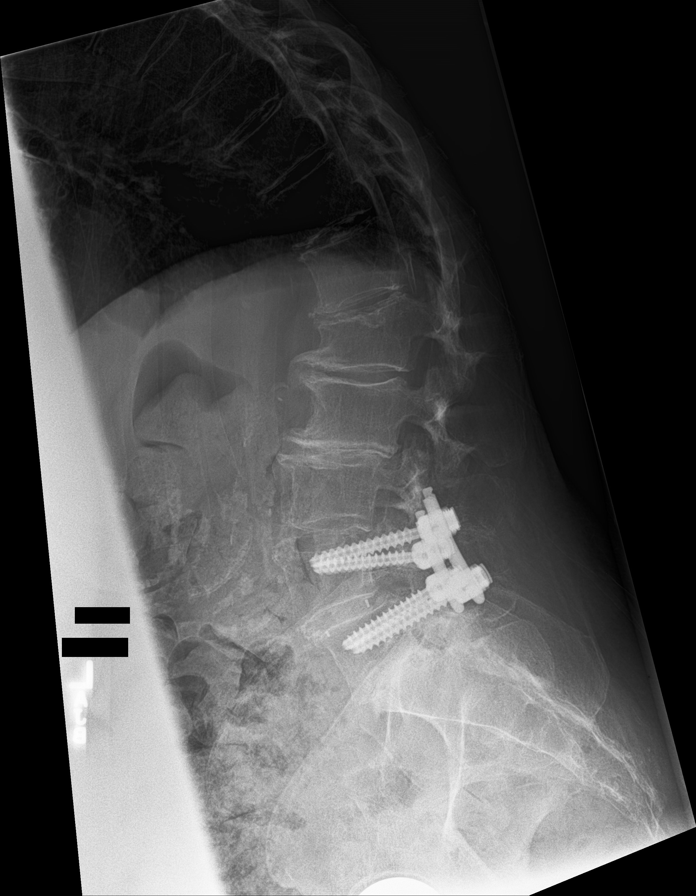

[l-spine ext]
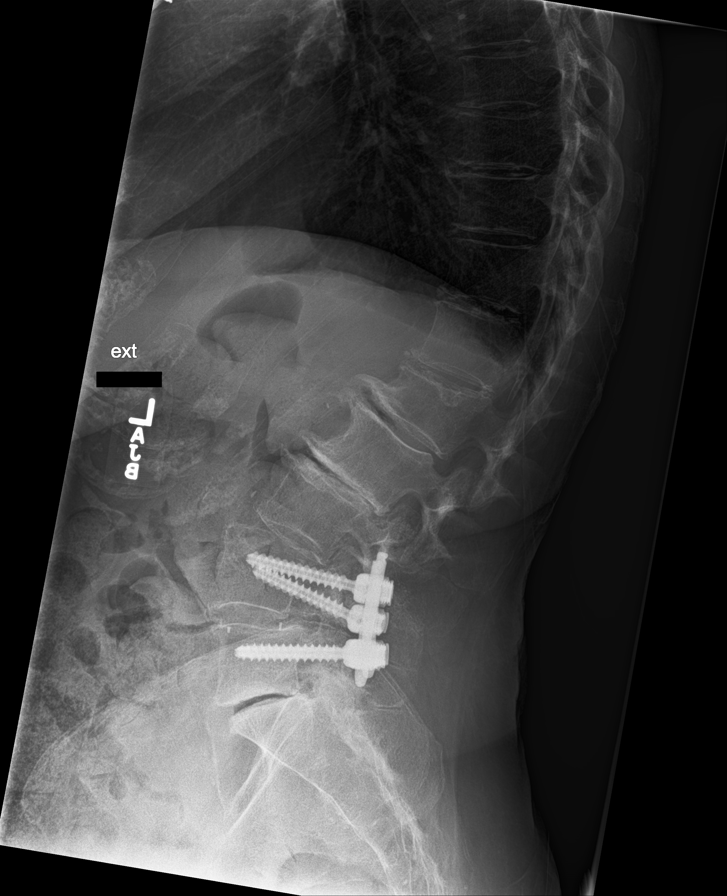

[l-spine ap]
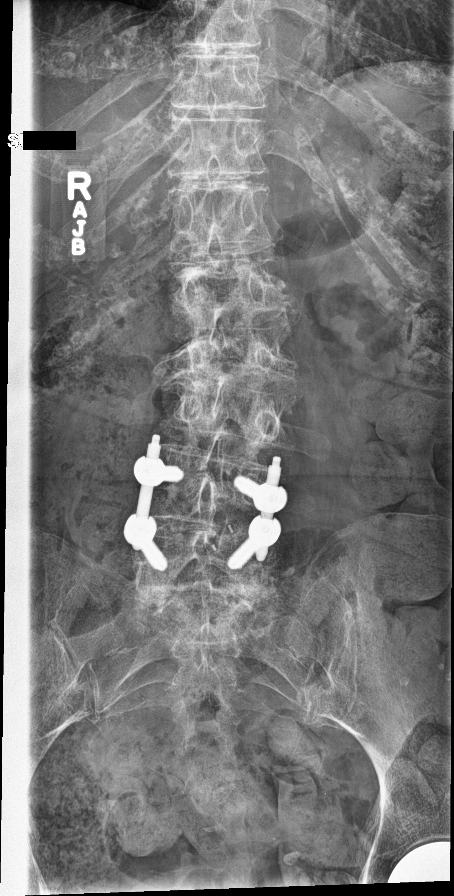

[Series 5: l-spine obl · 0.14mm/px · 2 of 2 slices shown (1 of 2)]
[im 1/2]
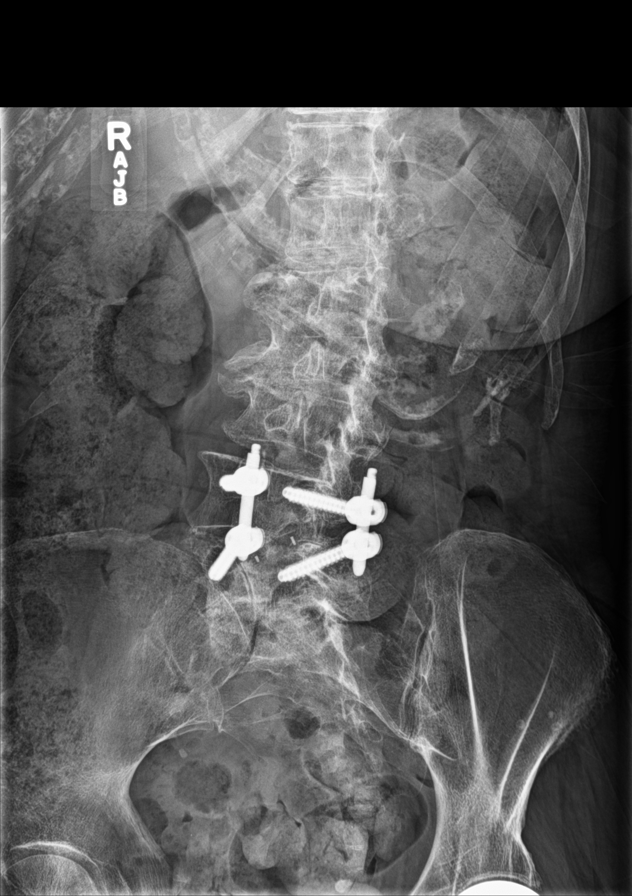
[im 2/2]
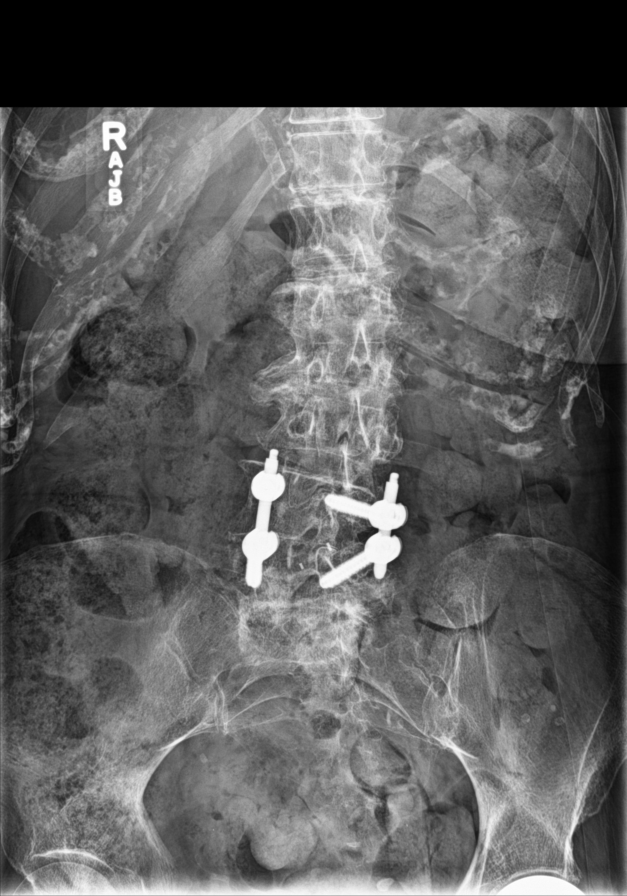

[l-spine obl (2 of 2)]
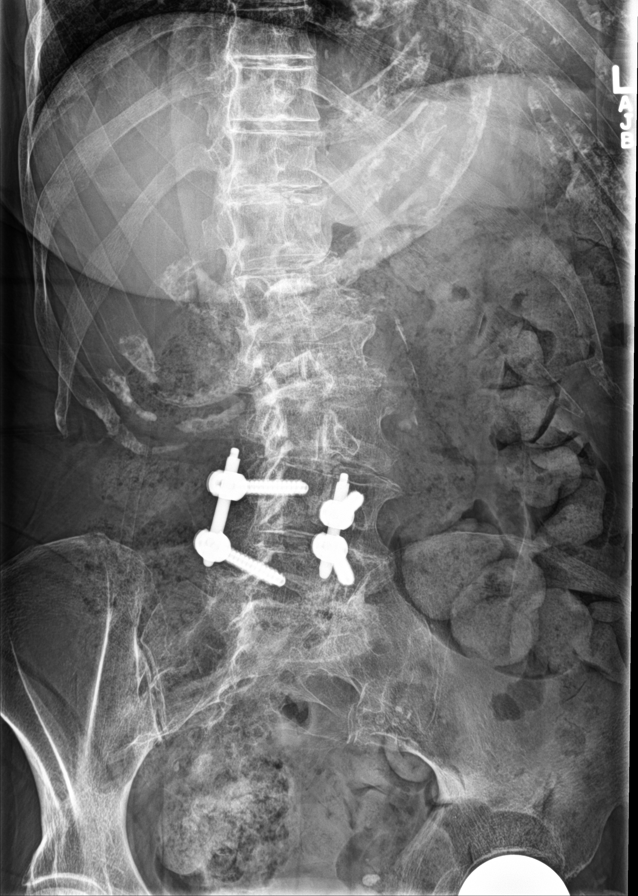

[l-spine spot]
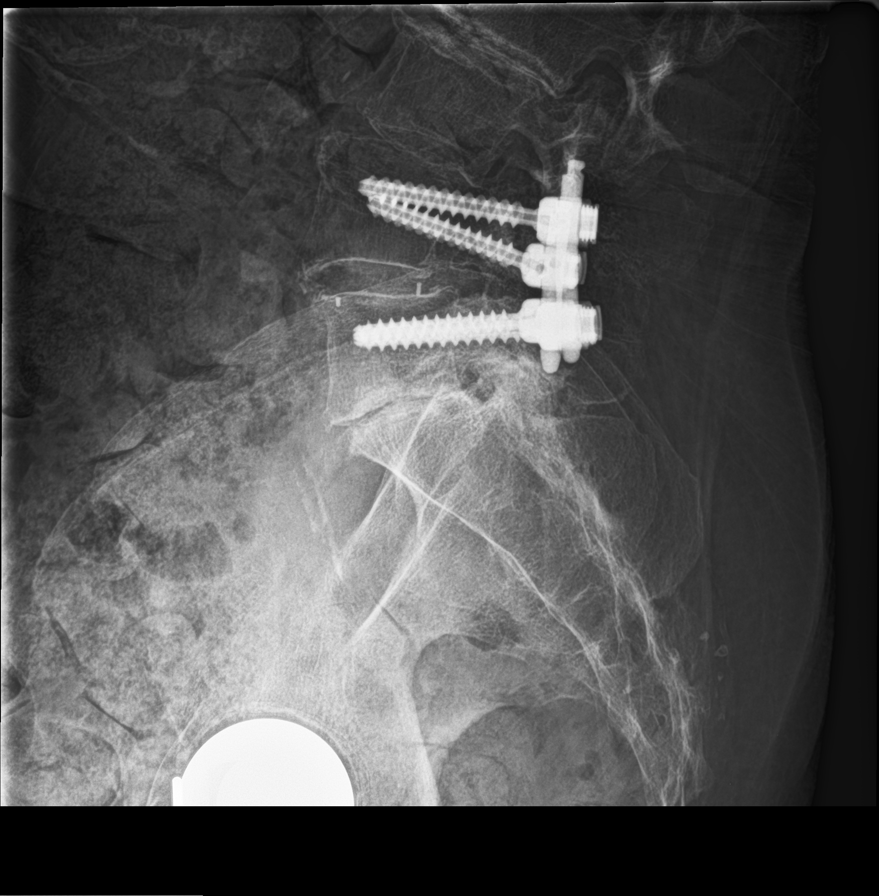

[8 of 8 positions shown; findings below may reference images not displayed]

FINDINGS: Hypoplastic last rib pair.

5 non-rib-bearing lumbar vertebra.

Prior posterior fusion L4-L5 with BILATERAL pedicle screws and bars.

Hardware appears intact without surrounding lucency.

L4-L5 disc prosthesis appears unchanged.

Anterior height loss of L1 appears grossly unchanged since previous
exam.

Multilevel disc space narrowing and endplate spur formation
pronounced at L1-L2 and L2-L3.

Vacuum phenomenon at L5-S1.

No definite acute fracture or bone destruction.

Minimal anterolisthesis at L4-L5 appears stable.

Retrolisthesis is presence at L3-L4, approximately 7.4 mm at
neutral, decreased at 3.4 mm with flexion and increased 8.7 mm with
extension.

Scattered facet degenerative changes lumbar spine.
IMPRESSION: Multilevel degenerative disc and facet disease changes of lumbar
spine as above with evidence of prior L4-L5 posterior fusion.

Retrolisthesis at L3-L4 at neutral which decreases with flexion and
slightly increases with extension.

Osseous demineralization with chronic superior endplate compression
fracture deformity of L1.

## 2019-11-21 IMAGING — DX DG CHEST 1V PORT
1 series · 1 of 1 positions shown · non-contrast
Comparison: 02/03/2015

CLINICAL DATA: Hip fracture. Former smoker 20 years ago with
hypertension.

EXAM:
PORTABLE CHEST 1 VIEW

[chest ap]
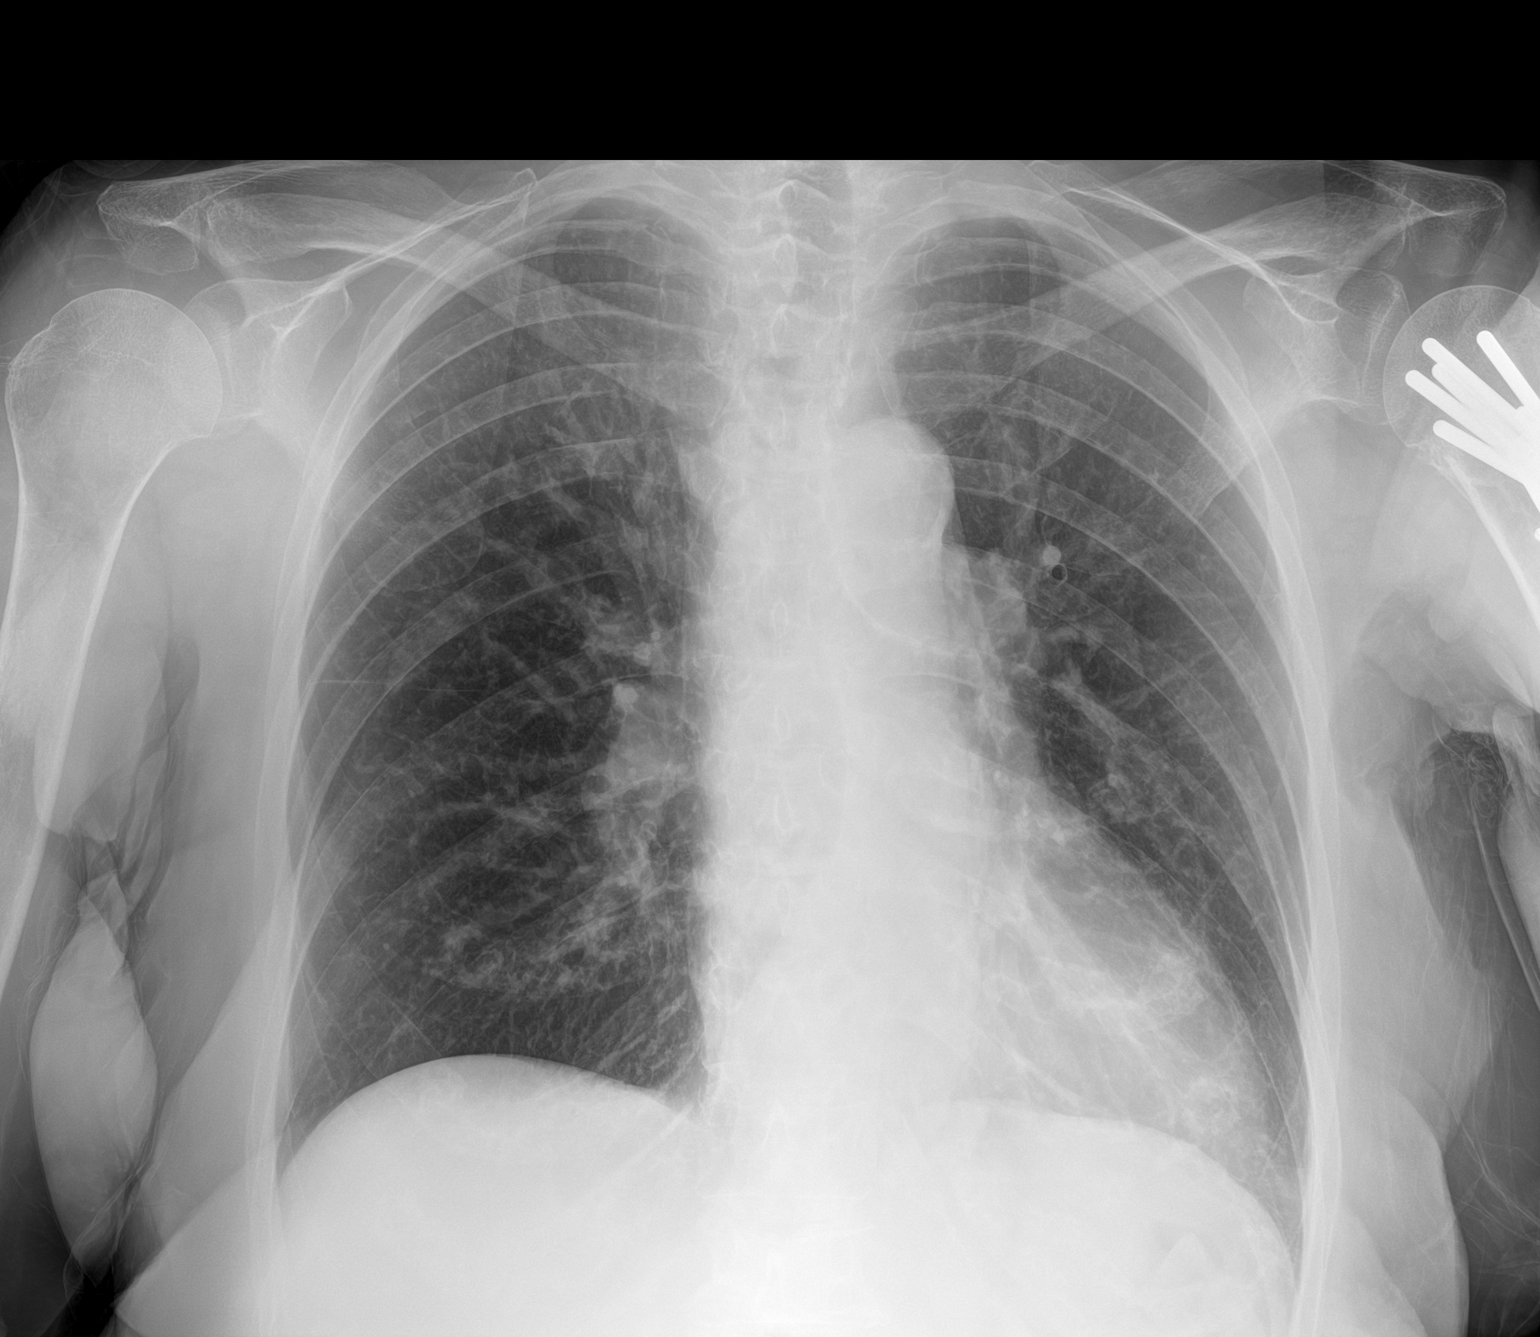

[1 of 1 positions shown; findings below may reference images not displayed]

FINDINGS: Heart is top-normal in size. There is mild-to-moderate aortic
atherosclerosis with slight uncoiling of the thoracic aorta.
Probable small hiatal hernia accounting for soft tissue density
projecting over the cardiac silhouette. Costochondral calcifications
are present bilaterally. No pulmonary consolidations suspicious for
pneumonia. No effusion or pneumothorax. No pulmonary edema.
Partially included left humerus fixation hardware is noted.
IMPRESSION: Aortic atherosclerosis.  No active pulmonary disease.

## 2019-11-21 IMAGING — CR DG HIP (WITH OR WITHOUT PELVIS) 2-3V*R*
1 series · 3 of 3 positions shown · non-contrast
Comparison: None.

CLINICAL DATA: Patient fell from a chair yesterday and reported no
pain in the right hip patella 2 p.m. today.

EXAM:
DG HIP (WITH OR WITHOUT PELVIS) 2-3V RIGHT

[Series 1: dg hip unilat w or w/o pelvis 2-3 views  · non-contrast · 0.14mm/px · 3 of 3 slices shown]
[im 1/3]
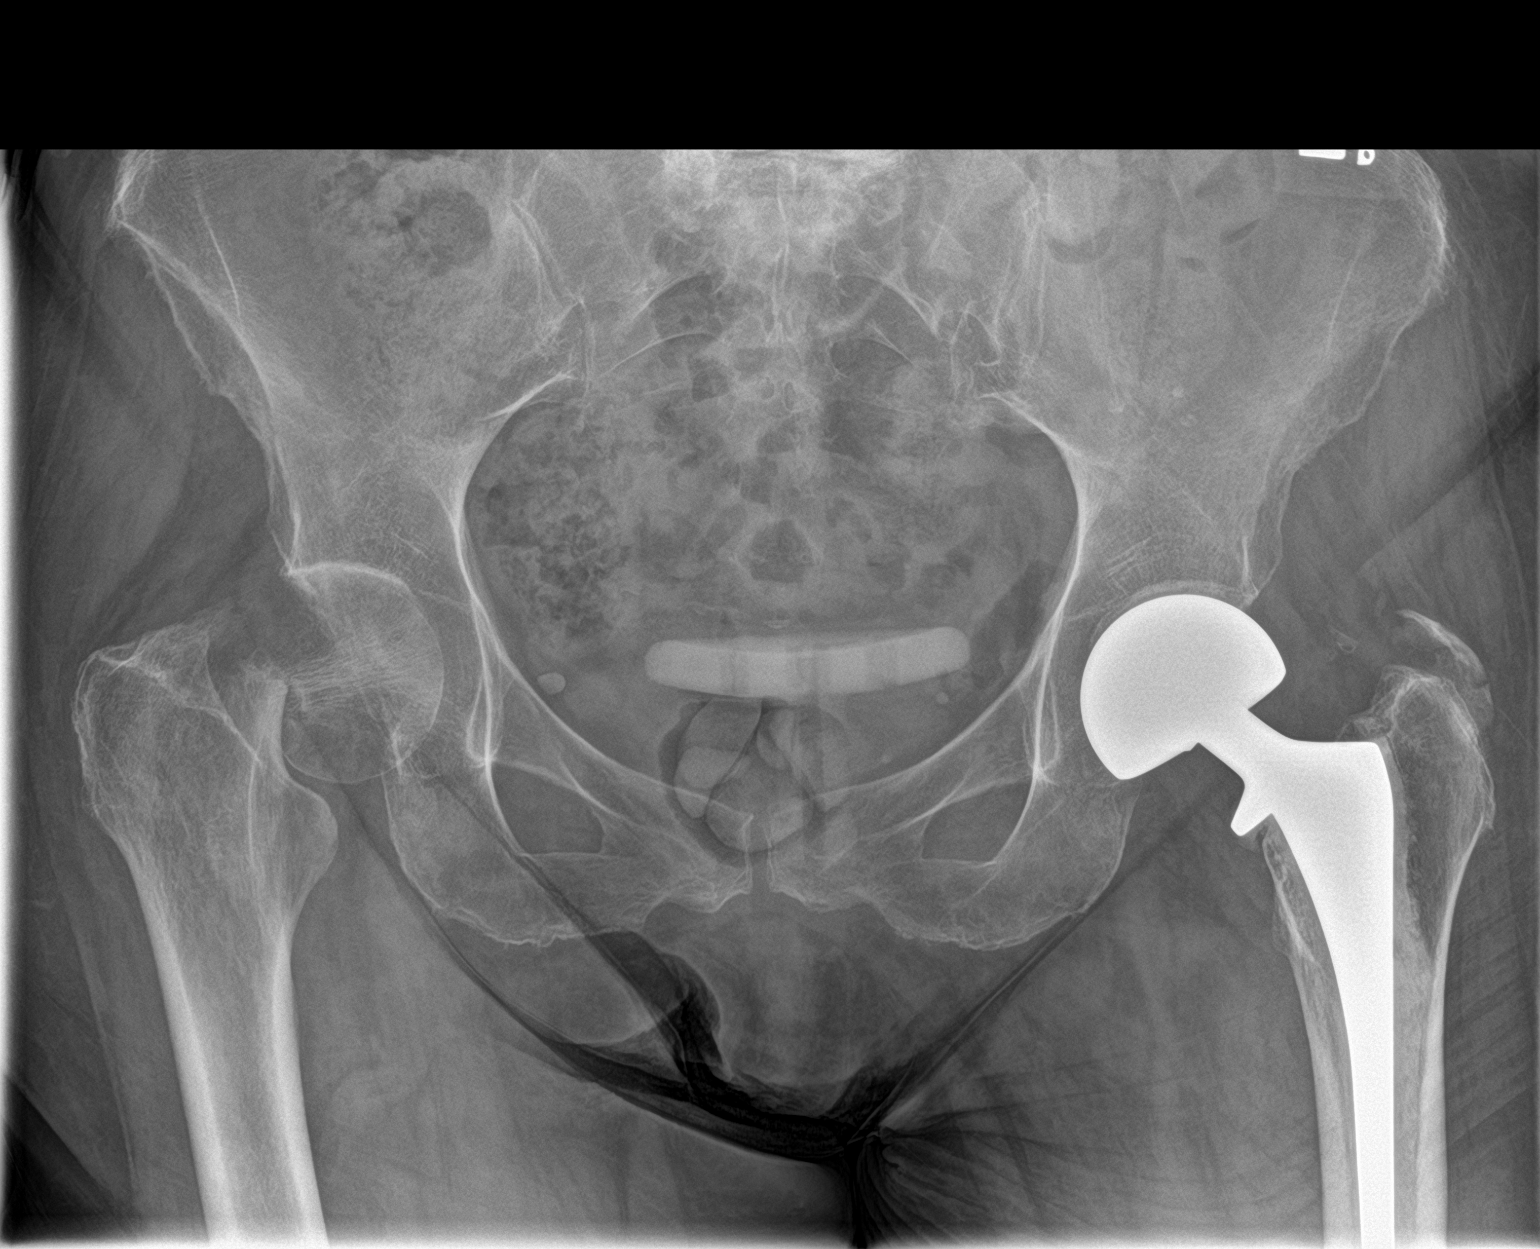
[im 2/3]
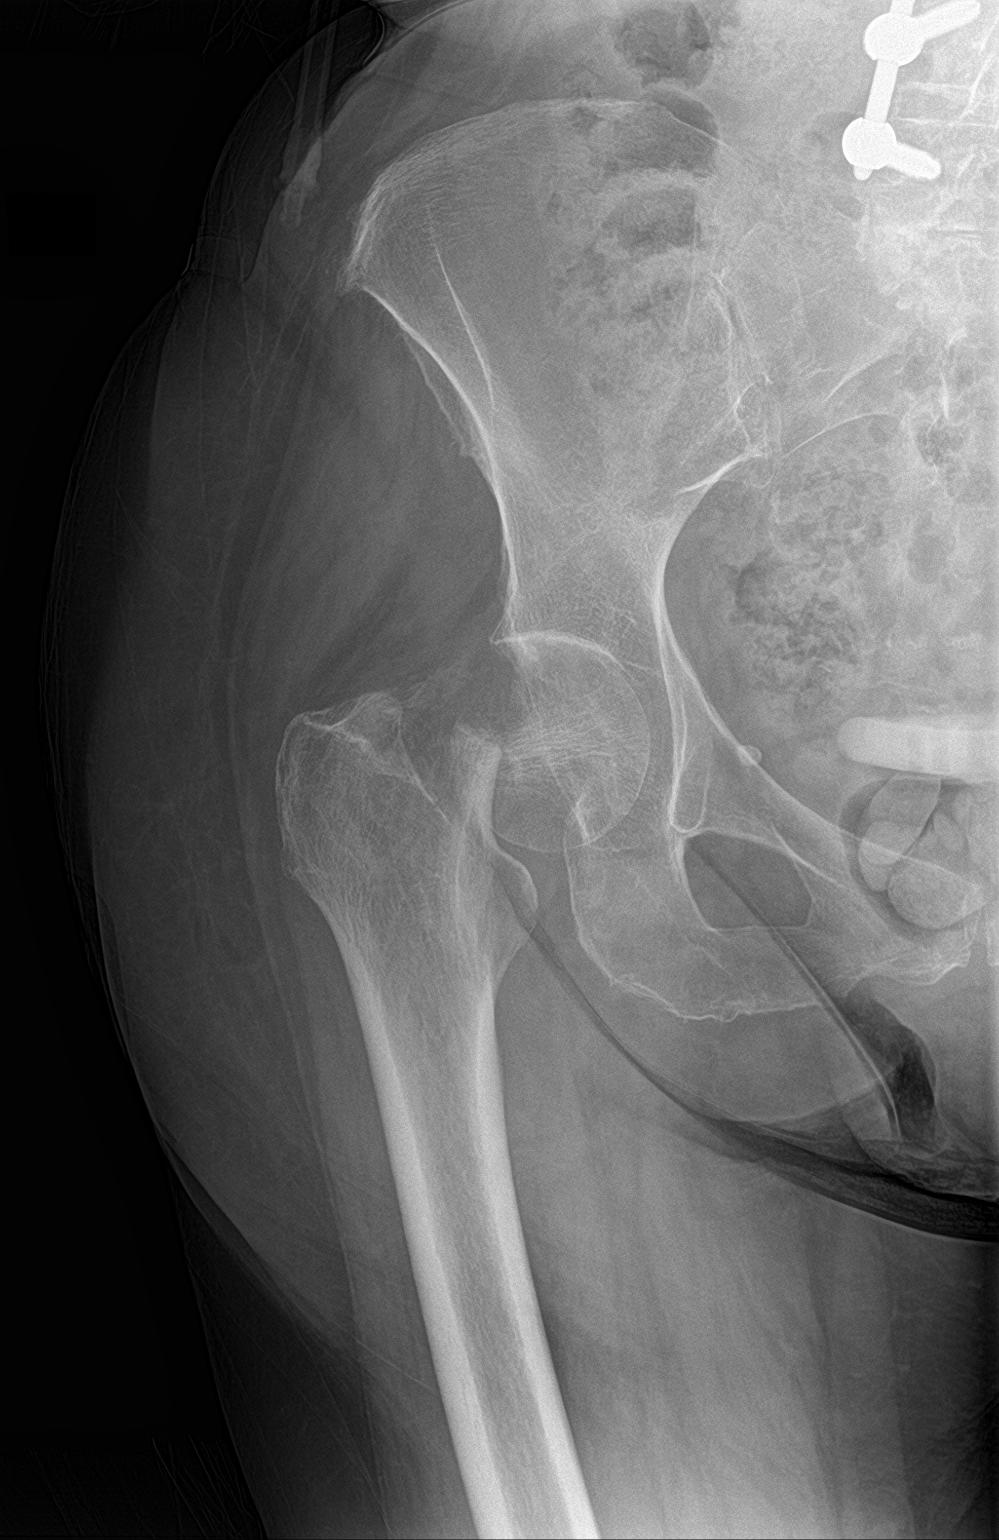
[im 3/3]
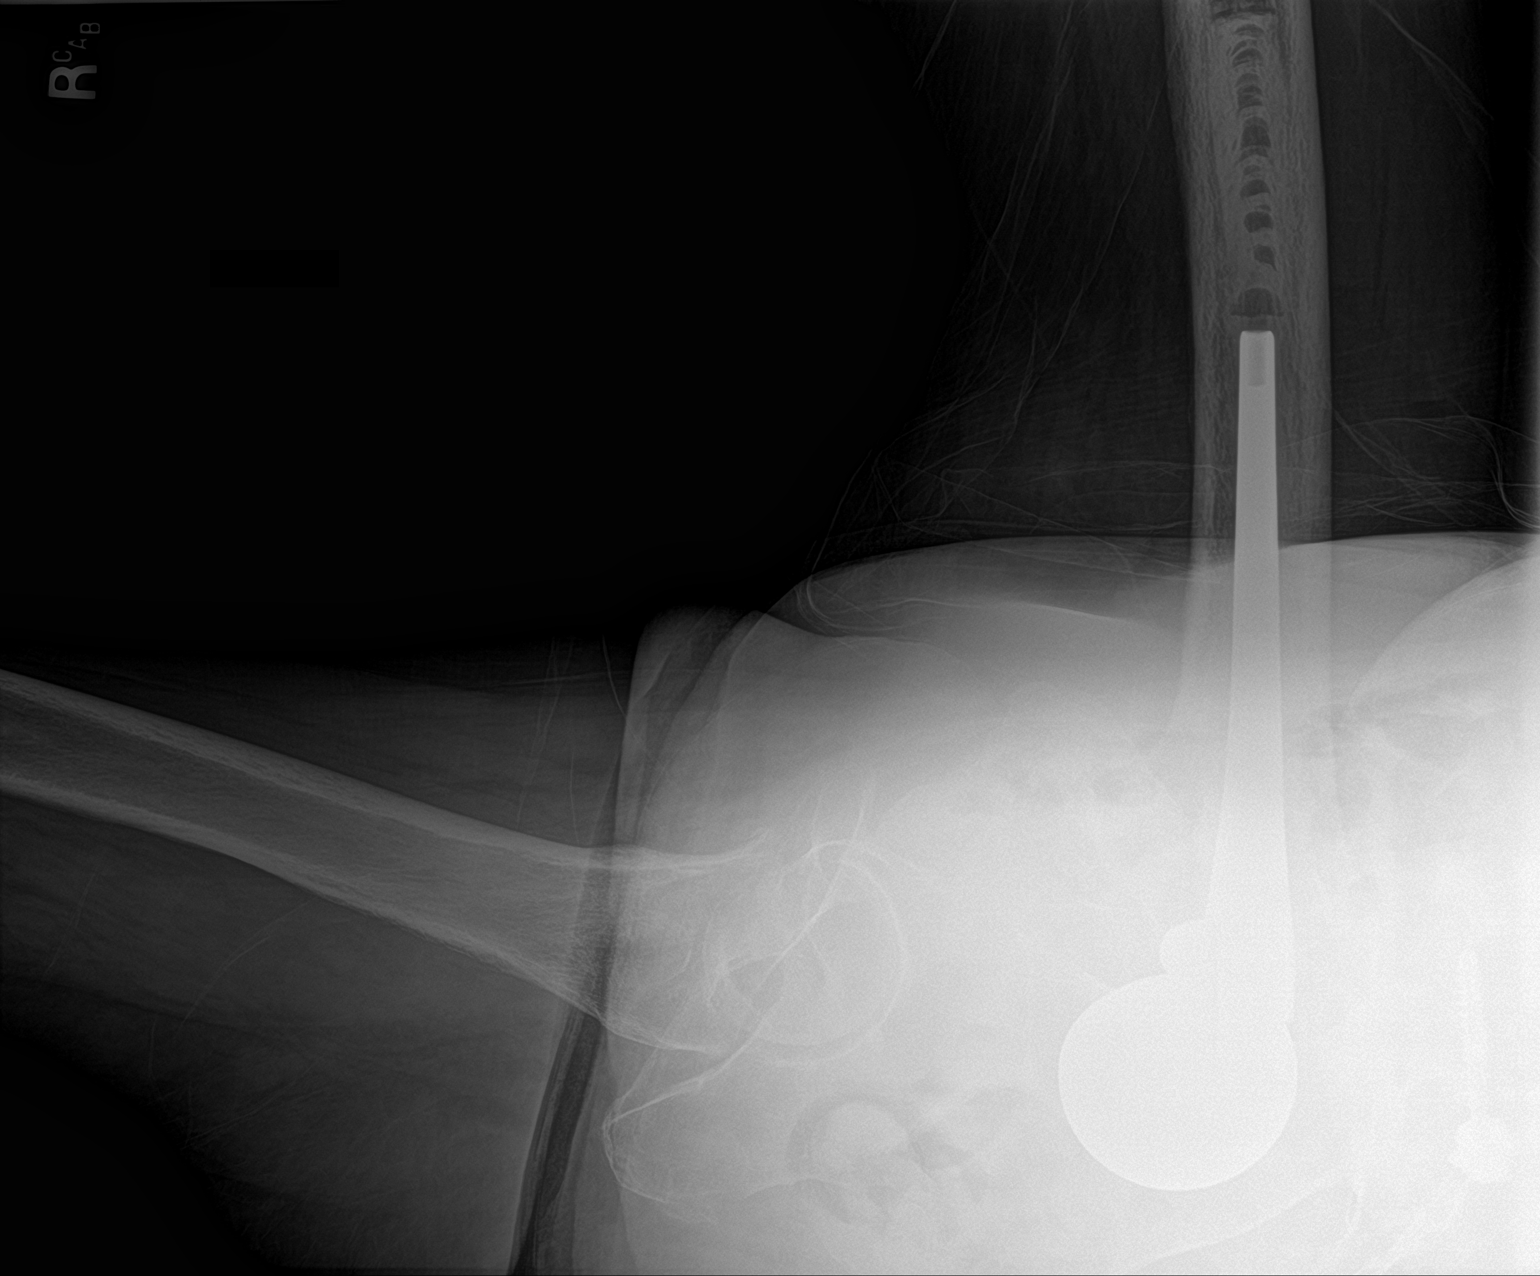

[3 of 3 positions shown; findings below may reference images not displayed]

FINDINGS: There is a varus angulated varus angulated transcervical fracture of
the right femur with probable slight impaction of the femoral head
upon the base of femoral head. Osteoarthritic joint space narrowing
is seen of the native right hip. The bony pelvis appears intact
without apparent fracture. Partially included lower lumbar posterior
spinal fusion hardware extends to the L5 level bilaterally. There is
lower lumbar degenerative disc and facet arthropathy from L4 through
S1. No pelvic diastasis. A bipolar left hip arthroplasty also noted
with cemented femoral component but without without complicating
features. Gluteal enthesopathy is noted off the left greater
trochanter. A pessary is in place.
IMPRESSION: Acute varus angulated slightly impacted transcervical fracture of
the right femur.

## 2019-11-22 IMAGING — DX DG HIP (WITH OR WITHOUT PELVIS) 1V PORT*R*
2 series · 2 of 2 positions shown · non-contrast
Comparison: Plain film dated 06/03/2018.

CLINICAL DATA: Post-op right hip hemiarthroplasty.

EXAM:
DG HIP (WITH OR WITHOUT PELVIS) 1V PORT RIGHT

[pelvis ap]
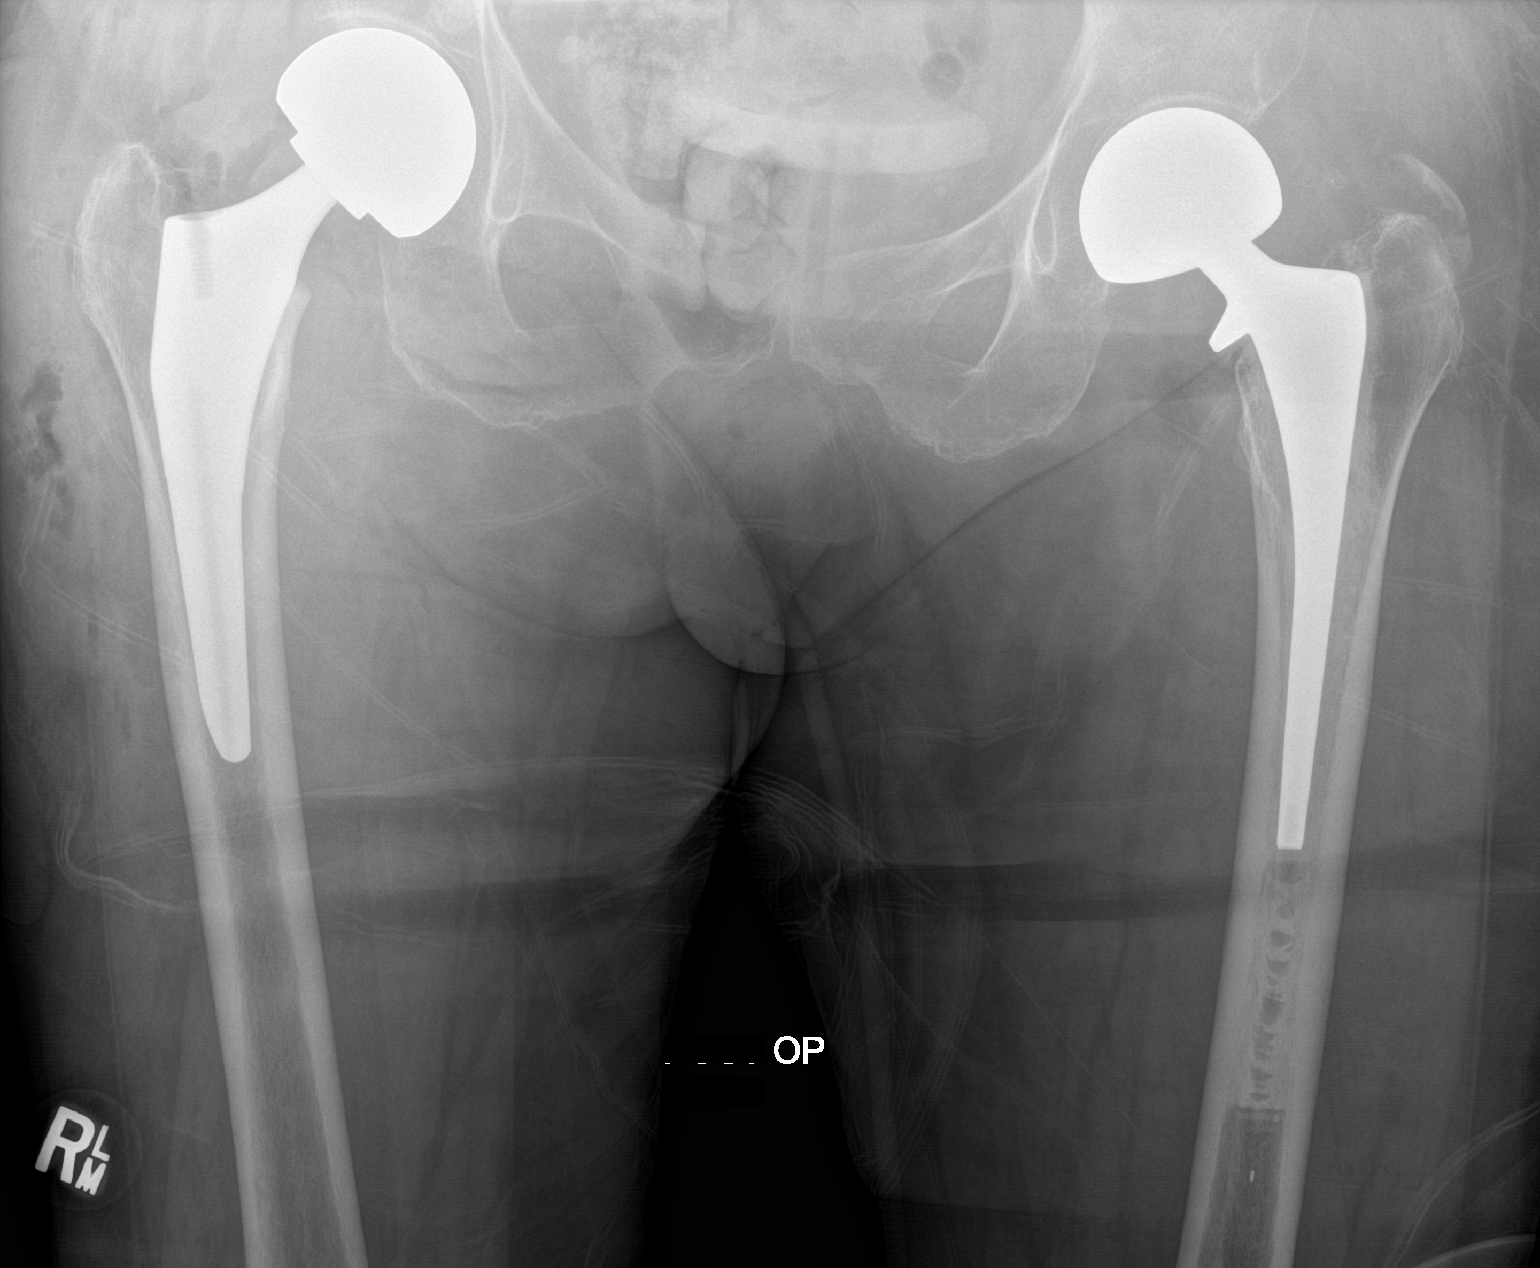

[hip lat]
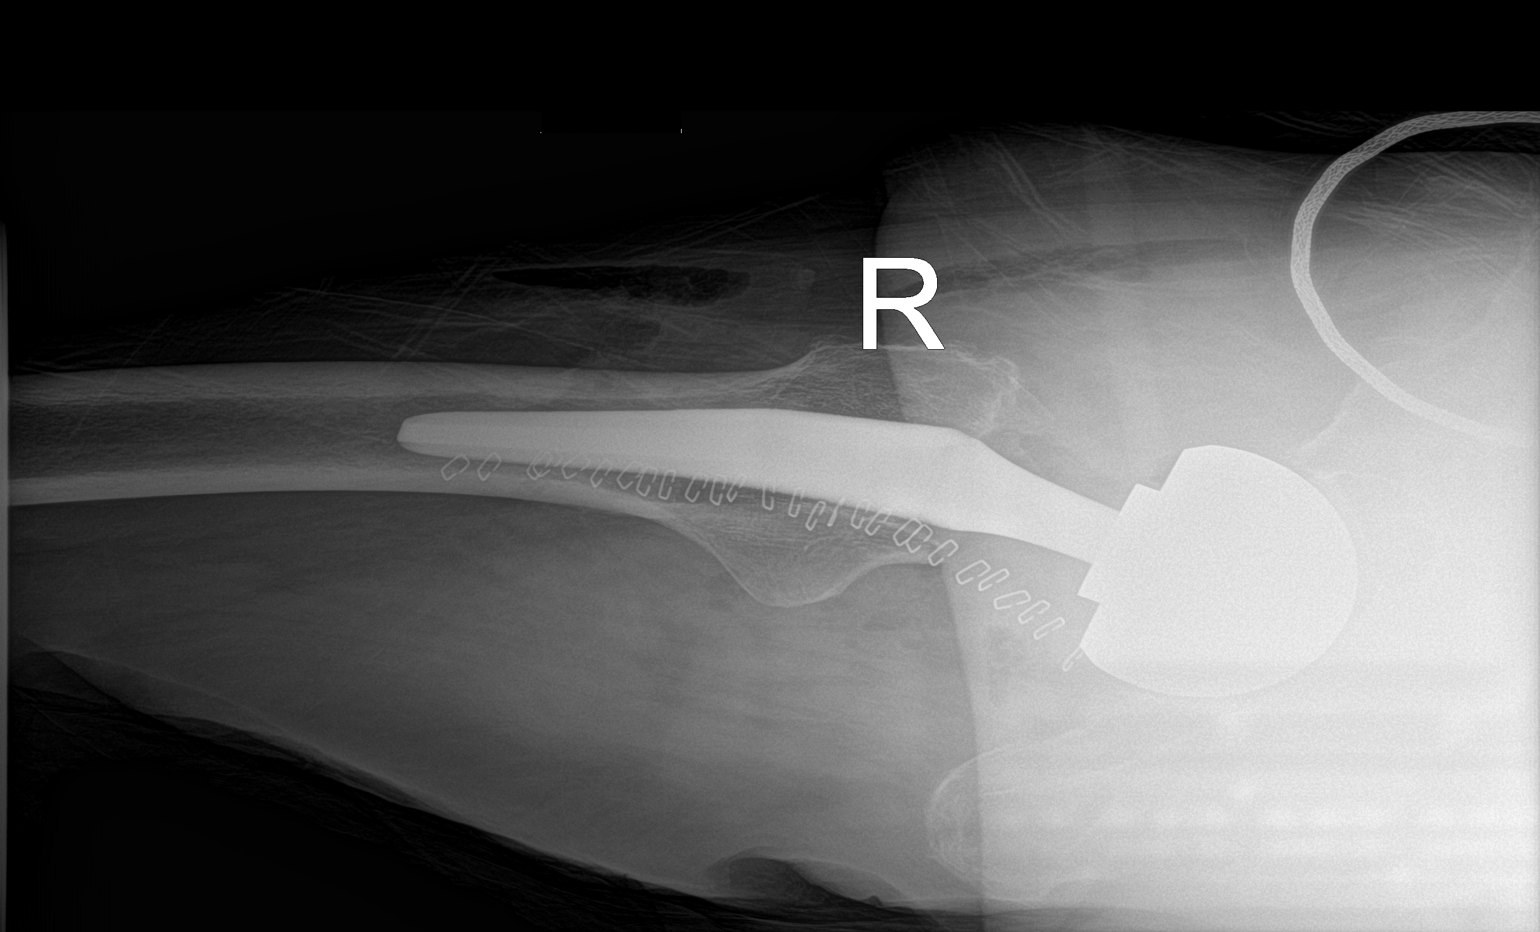

[2 of 2 positions shown; findings below may reference images not displayed]

FINDINGS: New RIGHT hip arthroplasty hardware appears appropriately
positioned. Expected postsurgical changes within the overlying soft
tissues. LEFT hip arthroplasty hardware appears stable in position.
IMPRESSION: Status post RIGHT hip arthroplasty. No evidence of surgical
complicating feature.
